# Patient Record
Sex: Male | Born: 1935 | Race: White | Hispanic: No | Smoking: Current every day smoker
Health system: Southern US, Community
[De-identification: ages and names within clinical notes are randomized; demographics above are authoritative.]

## PROBLEM LIST (undated history)

## (undated) DIAGNOSIS — N529 Male erectile dysfunction, unspecified: Secondary | ICD-10-CM

## (undated) DIAGNOSIS — R079 Chest pain, unspecified: Secondary | ICD-10-CM

## (undated) DIAGNOSIS — G63 Polyneuropathy in diseases classified elsewhere: Principal | ICD-10-CM

## (undated) DIAGNOSIS — C61 Malignant neoplasm of prostate: Secondary | ICD-10-CM

## (undated) DIAGNOSIS — Z860100 Personal history of colon polyps, unspecified: Secondary | ICD-10-CM

## (undated) DIAGNOSIS — J449 Chronic obstructive pulmonary disease, unspecified: Secondary | ICD-10-CM

## (undated) DIAGNOSIS — G8929 Other chronic pain: Secondary | ICD-10-CM

## (undated) DIAGNOSIS — R296 Repeated falls: Secondary | ICD-10-CM

## (undated) DIAGNOSIS — E43 Unspecified severe protein-calorie malnutrition: Secondary | ICD-10-CM

## (undated) DIAGNOSIS — L03115 Cellulitis of right lower limb: Secondary | ICD-10-CM

## (undated) DIAGNOSIS — E785 Hyperlipidemia, unspecified: Secondary | ICD-10-CM

## (undated) DIAGNOSIS — G629 Polyneuropathy, unspecified: Secondary | ICD-10-CM

## (undated) DIAGNOSIS — N400 Enlarged prostate without lower urinary tract symptoms: Secondary | ICD-10-CM

## (undated) DIAGNOSIS — J189 Pneumonia, unspecified organism: Secondary | ICD-10-CM

## (undated) DIAGNOSIS — D510 Vitamin B12 deficiency anemia due to intrinsic factor deficiency: Secondary | ICD-10-CM

## (undated) DIAGNOSIS — K625 Hemorrhage of anus and rectum: Secondary | ICD-10-CM

## (undated) DIAGNOSIS — F419 Anxiety disorder, unspecified: Secondary | ICD-10-CM

## (undated) DIAGNOSIS — F32A Depression, unspecified: Secondary | ICD-10-CM

## (undated) DIAGNOSIS — M51379 Other intervertebral disc degeneration, lumbosacral region without mention of lumbar back pain or lower extremity pain: Secondary | ICD-10-CM

## (undated) DIAGNOSIS — F329 Major depressive disorder, single episode, unspecified: Secondary | ICD-10-CM

## (undated) DIAGNOSIS — M545 Low back pain, unspecified: Secondary | ICD-10-CM

## (undated) DIAGNOSIS — M1990 Unspecified osteoarthritis, unspecified site: Secondary | ICD-10-CM

## (undated) DIAGNOSIS — M069 Rheumatoid arthritis, unspecified: Secondary | ICD-10-CM

## (undated) DIAGNOSIS — M5137 Other intervertebral disc degeneration, lumbosacral region: Secondary | ICD-10-CM

## (undated) DIAGNOSIS — K219 Gastro-esophageal reflux disease without esophagitis: Secondary | ICD-10-CM

## (undated) DIAGNOSIS — C44311 Basal cell carcinoma of skin of nose: Secondary | ICD-10-CM

## (undated) DIAGNOSIS — M810 Age-related osteoporosis without current pathological fracture: Secondary | ICD-10-CM

## (undated) DIAGNOSIS — C50929 Malignant neoplasm of unspecified site of unspecified male breast: Secondary | ICD-10-CM

## (undated) DIAGNOSIS — M47812 Spondylosis without myelopathy or radiculopathy, cervical region: Secondary | ICD-10-CM

## (undated) DIAGNOSIS — M4800 Spinal stenosis, site unspecified: Secondary | ICD-10-CM

## (undated) DIAGNOSIS — I10 Essential (primary) hypertension: Secondary | ICD-10-CM

## (undated) DIAGNOSIS — M47817 Spondylosis without myelopathy or radiculopathy, lumbosacral region: Secondary | ICD-10-CM

## (undated) DIAGNOSIS — I739 Peripheral vascular disease, unspecified: Secondary | ICD-10-CM

## (undated) HISTORY — DX: Spinal stenosis, site unspecified: M48.00

## (undated) HISTORY — PX: BASAL CELL CARCINOMA EXCISION: SHX1214

## (undated) HISTORY — PX: KNEE SURGERY: SHX244

## (undated) HISTORY — DX: Male erectile dysfunction, unspecified: N52.9

## (undated) HISTORY — DX: Spondylosis without myelopathy or radiculopathy, lumbosacral region: M47.817

## (undated) HISTORY — PX: WRIST FUSION: SHX839

## (undated) HISTORY — DX: Unspecified severe protein-calorie malnutrition: E43

## (undated) HISTORY — DX: Hyperlipidemia, unspecified: E78.5

## (undated) HISTORY — PX: HERNIA REPAIR: SHX51

## (undated) HISTORY — PX: CERVICAL DISC SURGERY: SHX588

## (undated) HISTORY — DX: Polyneuropathy, unspecified: G62.9

## (undated) HISTORY — DX: Personal history of colon polyps, unspecified: Z86.0100

## (undated) HISTORY — DX: Peripheral vascular disease, unspecified: I73.9

## (undated) HISTORY — PX: TRANSURETHRAL RESECTION OF PROSTATE: SHX73

## (undated) HISTORY — PX: CHOLECYSTECTOMY: SHX55

## (undated) HISTORY — DX: Anxiety disorder, unspecified: F41.9

## (undated) HISTORY — DX: Spondylosis without myelopathy or radiculopathy, cervical region: M47.812

## (undated) HISTORY — DX: Vitamin B12 deficiency anemia due to intrinsic factor deficiency: D51.0

## (undated) HISTORY — DX: Chronic obstructive pulmonary disease, unspecified: J44.9

## (undated) HISTORY — DX: Benign prostatic hyperplasia without lower urinary tract symptoms: N40.0

## (undated) HISTORY — DX: Personal history of colonic polyps: Z86.010

## (undated) HISTORY — DX: Polyneuropathy in diseases classified elsewhere: G63

---

## 1958-10-16 DIAGNOSIS — J189 Pneumonia, unspecified organism: Secondary | ICD-10-CM

## 1958-10-16 HISTORY — PX: EXCISIONAL HEMORRHOIDECTOMY: SHX1541

## 1958-10-16 HISTORY — DX: Pneumonia, unspecified organism: J18.9

## 1997-07-08 ENCOUNTER — Ambulatory Visit (HOSPITAL_COMMUNITY): Admission: RE | Admit: 1997-07-08 | Discharge: 1997-07-08 | Payer: Self-pay | Admitting: Urology

## 1997-10-28 ENCOUNTER — Ambulatory Visit (HOSPITAL_BASED_OUTPATIENT_CLINIC_OR_DEPARTMENT_OTHER): Admission: RE | Admit: 1997-10-28 | Discharge: 1997-10-28 | Payer: Self-pay | Admitting: Plastic Surgery

## 1998-02-05 ENCOUNTER — Inpatient Hospital Stay (HOSPITAL_COMMUNITY): Admission: RE | Admit: 1998-02-05 | Discharge: 1998-02-08 | Payer: Self-pay | Admitting: Neurosurgery

## 1998-07-17 ENCOUNTER — Ambulatory Visit (HOSPITAL_BASED_OUTPATIENT_CLINIC_OR_DEPARTMENT_OTHER): Admission: RE | Admit: 1998-07-17 | Discharge: 1998-07-17 | Payer: Self-pay | Admitting: Urology

## 1999-07-30 ENCOUNTER — Encounter: Payer: Self-pay | Admitting: Emergency Medicine

## 1999-07-30 ENCOUNTER — Emergency Department (HOSPITAL_COMMUNITY): Admission: EM | Admit: 1999-07-30 | Discharge: 1999-07-30 | Payer: Self-pay | Admitting: Emergency Medicine

## 1999-08-02 ENCOUNTER — Encounter: Admission: RE | Admit: 1999-08-02 | Discharge: 1999-10-31 | Payer: Self-pay | Admitting: Anesthesiology

## 1999-09-03 ENCOUNTER — Encounter (INDEPENDENT_AMBULATORY_CARE_PROVIDER_SITE_OTHER): Payer: Self-pay | Admitting: Specialist

## 1999-09-03 ENCOUNTER — Ambulatory Visit (HOSPITAL_BASED_OUTPATIENT_CLINIC_OR_DEPARTMENT_OTHER): Admission: RE | Admit: 1999-09-03 | Discharge: 1999-09-03 | Payer: Self-pay | Admitting: Urology

## 2000-07-24 ENCOUNTER — Encounter: Payer: Self-pay | Admitting: Family Medicine

## 2000-07-24 ENCOUNTER — Ambulatory Visit (HOSPITAL_COMMUNITY): Admission: RE | Admit: 2000-07-24 | Discharge: 2000-07-24 | Payer: Self-pay | Admitting: Family Medicine

## 2001-10-02 ENCOUNTER — Encounter: Payer: Self-pay | Admitting: Family Medicine

## 2001-10-02 ENCOUNTER — Encounter: Admission: RE | Admit: 2001-10-02 | Discharge: 2001-10-02 | Payer: Self-pay | Admitting: Family Medicine

## 2002-02-14 DIAGNOSIS — C61 Malignant neoplasm of prostate: Secondary | ICD-10-CM

## 2002-02-14 HISTORY — DX: Malignant neoplasm of prostate: C61

## 2002-03-18 ENCOUNTER — Encounter: Admission: RE | Admit: 2002-03-18 | Discharge: 2002-03-18 | Payer: Self-pay | Admitting: Infectious Diseases

## 2002-10-01 ENCOUNTER — Encounter (INDEPENDENT_AMBULATORY_CARE_PROVIDER_SITE_OTHER): Payer: Self-pay

## 2002-10-01 ENCOUNTER — Ambulatory Visit (HOSPITAL_BASED_OUTPATIENT_CLINIC_OR_DEPARTMENT_OTHER): Admission: RE | Admit: 2002-10-01 | Discharge: 2002-10-01 | Payer: Self-pay | Admitting: Urology

## 2002-10-24 ENCOUNTER — Ambulatory Visit (HOSPITAL_COMMUNITY): Admission: RE | Admit: 2002-10-24 | Discharge: 2002-10-24 | Payer: Self-pay | Admitting: Urology

## 2002-10-24 ENCOUNTER — Encounter: Payer: Self-pay | Admitting: Urology

## 2002-11-25 ENCOUNTER — Ambulatory Visit: Admission: RE | Admit: 2002-11-25 | Discharge: 2003-02-23 | Payer: Self-pay | Admitting: Radiation Oncology

## 2002-12-31 ENCOUNTER — Ambulatory Visit (HOSPITAL_BASED_OUTPATIENT_CLINIC_OR_DEPARTMENT_OTHER): Admission: RE | Admit: 2002-12-31 | Discharge: 2002-12-31 | Payer: Self-pay | Admitting: Urology

## 2003-02-24 ENCOUNTER — Ambulatory Visit: Admission: RE | Admit: 2003-02-24 | Discharge: 2003-04-14 | Payer: Self-pay | Admitting: Radiation Oncology

## 2003-04-07 ENCOUNTER — Encounter: Admission: RE | Admit: 2003-04-07 | Discharge: 2003-07-06 | Payer: Self-pay | Admitting: Neurology

## 2003-04-28 ENCOUNTER — Ambulatory Visit: Admission: RE | Admit: 2003-04-28 | Discharge: 2003-04-28 | Payer: Self-pay | Admitting: Radiation Oncology

## 2003-10-23 ENCOUNTER — Ambulatory Visit (HOSPITAL_COMMUNITY): Admission: RE | Admit: 2003-10-23 | Discharge: 2003-10-23 | Payer: Self-pay | Admitting: Gastroenterology

## 2003-10-23 ENCOUNTER — Encounter (INDEPENDENT_AMBULATORY_CARE_PROVIDER_SITE_OTHER): Payer: Self-pay | Admitting: Specialist

## 2003-10-28 ENCOUNTER — Ambulatory Visit: Admission: RE | Admit: 2003-10-28 | Discharge: 2003-10-28 | Payer: Self-pay | Admitting: Radiation Oncology

## 2004-02-19 ENCOUNTER — Ambulatory Visit (HOSPITAL_COMMUNITY): Admission: RE | Admit: 2004-02-19 | Discharge: 2004-02-19 | Payer: Self-pay | Admitting: Family Medicine

## 2005-06-15 ENCOUNTER — Encounter: Payer: Self-pay | Admitting: Neurosurgery

## 2005-11-18 ENCOUNTER — Encounter: Admission: RE | Admit: 2005-11-18 | Discharge: 2005-11-18 | Payer: Self-pay | Admitting: Family Medicine

## 2005-11-28 ENCOUNTER — Encounter: Admission: RE | Admit: 2005-11-28 | Discharge: 2005-11-28 | Payer: Self-pay | Admitting: Family Medicine

## 2006-05-26 ENCOUNTER — Encounter: Admission: RE | Admit: 2006-05-26 | Discharge: 2006-05-26 | Payer: Self-pay | Admitting: Family Medicine

## 2007-08-28 ENCOUNTER — Encounter: Admission: RE | Admit: 2007-08-28 | Discharge: 2007-08-28 | Payer: Self-pay | Admitting: Family Medicine

## 2007-11-08 ENCOUNTER — Encounter: Admission: RE | Admit: 2007-11-08 | Discharge: 2007-11-08 | Payer: Self-pay | Admitting: General Surgery

## 2009-07-22 ENCOUNTER — Emergency Department (HOSPITAL_COMMUNITY): Admission: EM | Admit: 2009-07-22 | Discharge: 2009-07-22 | Payer: Self-pay | Admitting: Emergency Medicine

## 2009-12-01 ENCOUNTER — Encounter: Admission: RE | Admit: 2009-12-01 | Discharge: 2009-12-01 | Payer: Self-pay | Admitting: Cardiovascular Disease

## 2009-12-07 ENCOUNTER — Inpatient Hospital Stay (HOSPITAL_COMMUNITY): Admission: RE | Admit: 2009-12-07 | Discharge: 2009-12-08 | Payer: Self-pay | Admitting: Cardiovascular Disease

## 2009-12-07 HISTORY — PX: ILIAC ARTERY STENT: SHX1786

## 2010-03-07 ENCOUNTER — Encounter: Payer: Self-pay | Admitting: Family Medicine

## 2010-04-28 LAB — BASIC METABOLIC PANEL
CO2: 30 mEq/L (ref 19–32)
Chloride: 105 mEq/L (ref 96–112)
Creatinine, Ser: 0.97 mg/dL (ref 0.4–1.5)
GFR calc Af Amer: >60 mL/min (ref >60)
Glucose, Bld: 91 mg/dL (ref 70–99)

## 2010-04-28 LAB — CBC
Hemoglobin: 13.2 g/dL (ref 13.0–17.0)
MCH: 33 pg (ref 26.0–34.0)
MCV: 97 fL (ref 78.0–100.0)
Platelets: 118 10*3/uL — ABNORMAL LOW (ref 150–400)
RBC: 4 MIL/uL — ABNORMAL LOW (ref 4.22–5.81)

## 2010-06-29 NOTE — Procedures (Signed)
NAMENESANEL, AGUILA NO.:  [id number]   MEDICAL RECORD NO.:  [id number]          PATIENT TYPE:  INP   LOCATION:  2507                         FACILITY:  [facility]   PHYSICIAN:  Nanetta Batty, M.D.   DATE OF BIRTH:  06/19/1934   DATE OF PROCEDURE:  DATE OF DISCHARGE:                    PERIPHERAL VASCULAR INVASIVE PROCEDURE    Mr. [name] is a 76 year old thin-appearing, widowed Caucasian male,  father of 3, grandfather to 2 great grandchildren who was disabled for  20 years because of cervical disk disease.  He has had multiple  operations to his neck as well as his knees.  He was referred to Dr.  Charlsie Merles for lower extremity discomfort, and Doppler studies performed in  our office Nov 16, 2009.   His risk factors include 55 pack year history of tobacco abuse with a  one pack a day currently.  He has hyperlipidemia.  He has never had  heart attack or stroke.  He has noted some increase in physical  insertion.  He gets occasional chest pain.  He does complain of severe  lower extremity weakness or discomfort with Dopplers that showed a right  ABI of 0.46 and occluded right common iliac artery.  His left ABI is  0.87 with a high-frequency signal on his left common iliac artery and  distal left SFA.  Presenting Myoview is nonischemic.  He presents now  for angiography potential percutaneous intervention for lifestyle-  limiting claudication.   PROCEDURE DESCRIPTION:  The patient was brought to the second floor  [facility] angiographic suite in the post absorptive state.  He was  premedicated with p.o. Valium, IV Versed, and fentanyl.  His left groin  was prepped and shaved in usual sterile fashion.  A 1% Xylocaine was  used for local anesthesia.  A 5-French sheath was inserted into left  femoral artery using standard Seldinger technique.  A 5-French pigtail  catheter was used for abdominal aortography, bifemoral runoff using  bolus chase digital subtraction  step-table technique.  Visipaque dye was  used for entirety of the case.  Retrograde aortic pressures were  monitored in the case.   ANGIOGRAPHIC RESULTS.:  1. Abdominal aorta.      a.     Renal arteries - 60% left renal artery stenosis.      b.     Infrarenal abdominal aorta; moderate atherosclerotic       changes.  2. Left lower extremity.      a.     A 50% hypodense left common iliac artery stenosis with a 40-       mm pullback gradient after administration of 200 mcg of       intracoronary nitroglycerin.      b.     A 60% mid left SFA with three-vessel runoff.  3. Right lower extremity;      a.     Total right common iliac artery with reconstitution just       above the internal external iliac bifurcation 3-D, 70% above-the-       knee popliteal stenosis three-vessel runoff.   The patient  received 4000 units of heparin intravenously.  A 5-French  sheath was exchanged over the Versacore wire for a 6-French bright tip.  The left common iliac artery was then stented with a 10 x 4 Smart  nitinol self-expanding stent postdilated with an 8 x 2 Powerflex  resulting in reduction of 50% segmental hypodense stenosis to 0%  residual.  The patient tolerated procedure well.  The left common  femoral artery was then sealed with ExoSeal  hemostatic device successfully.  The patient left lab in stable  condition.  He will be gently hydrated overnight, treated with aspirin  and Plavix and discharged home in the morning.  He will get followup  Dopplers in the office and will see me back after that.  He is a  candidate for fem-fem crossover grafting.  He left the lab in stable  condition.      Nanetta Batty, M.D.      JB/MEDQ  D:  12/07/2009  T:  12/08/2009  Job:  [id number]   cc:   Second Floor [facility]  [facility]  Lenn Sink, D.P.M.  Bryan Lemma. Manus Gunning, M.D.   Electronically Signed by Nanetta Batty M.D. on 12/17/2009 02:55:03  PM

## 2010-07-02 NOTE — Op Note (Signed)
NAME:  ALIOU, MEALEY                           ACCOUNT NO.:  [id number]   MEDICAL RECORD NO.:  [id number]                   PATIENT TYPE:  AMB   LOCATION:  [facility]                                 FACILITY:  [facility]   PHYSICIAN:  Ronald L. Ovidio Hanger, M.D.           DATE OF BIRTH:  10-Aug-1934   DATE OF PROCEDURE:  12/31/2002  DATE OF DISCHARGE:                                 OPERATIVE REPORT   DIAGNOSIS:  Prostate cancer.   PROCEDURE:  Transrectal ultrasound of the prostate with gold seed marker  implantation.   SURGEON:  Lucrezia Starch. Earlene Plater, M.D.   ANESTHESIA:  LMA.   ESTIMATED BLOOD LOSS:  Negligible.   TUBES:  None.   COMPLICATIONS:  None.   INDICATION FOR PROCEDURE:  Mr. Christen is a very nice 76 year old white male  who has prostate cancer.  He has been unable to undergo transrectal  ultrasound without full anesthesia.  He has elected to proceed with IMRT-  type external beam radiotherapy and needs gold seed implantation for the  localization.  After understanding the risks, benefits, and alternatives, he  has elected to proceed.   PROCEDURE IN DETAIL:  The patient was placed in the supine position, after  proper LMA anesthesia was placed in the dorsal lithotomy position.  The  transrectal ultrasound probe, Siemens, was placed.  Ultrasound of the  prostate was performed and gold seeds were implanted at the right base  periurethral, at the right apex, and the left midprostate laterally, for a  total of three seeds.  Following implantation, the device was removed and  the patient was taken to the recovery room in stable condition.                                               Ronald L. Ovidio Hanger, M.D.    RLD/MEDQ  D:  12/31/2002  T:  12/31/2002  Job:  [id number]

## 2010-07-02 NOTE — Op Note (Signed)
NAME:  [name], [name]                           ACCOUNT NO.:  [id number]   MEDICAL RECORD NO.:  [id number]                   PATIENT TYPE:  AMB   LOCATION:  ENDO                                 [facility]ACILITY:  [facility]   PHYSICIAN:  Danise Edge, M.D.                DATE OF BIRTH:  1934-08-23   DATE OF PROCEDURE:  10/23/2003  DATE OF DISCHARGE:                                 OPERATIVE REPORT   PROCEDURES:  Esophagogastroduodenoscopy, colonoscopy, and polypectomy.   PROCEDURE INDICATION:  [name] is a 76 year old male born May 20, 1934.  [name] suffers with constipation unassociated with  gastrointestinal bleeding.  His mother was diagnosed with colon cancer.  He  is scheduled to undergo his first screening colonoscopy with polypectomy to  prevent colon cancer.   When swallowing pills, [name] feels as though they stop in either the  hypopharynx or proximal esophagus.  He will undergo a diagnostic  esophagogastroduodenoscopy with possible esophageal dilation to evaluate  cervical esophageal dysphagia.   MEDICATION ALLERGIES:  PENICILLIN.   CHRONIC MEDICATIONS:  Lipitor, Percodan, vitamin C, multivitamin.   PAST MEDICAL HISTORY:  1.  Radiation therapy for prostate cancer.  2.  Cervical spine surgery.  3.  Knee surgery.  4.  Nose surgery.  5.  Cholecystectomy.   FAMILY HISTORY:  Mother with colon cancer.   ENDOSCOPIST:  Danise Edge, M.D.   PREMEDICATION:  Demerol 100 mg, Versed 10 mg.   PROCEDURE:  Diagnostic esophagogastroduodenoscopy.  After obtaining informed  consent, [name] was placed in the left lateral decubitus position.  I  administered intravenous Demerol and intravenous Versed to achieve conscious  sedation for the procedure.  The patient's blood pressure, oxygen  saturation, and cardiac rhythm were monitored throughout the procedure and  documented in the medical record.   The Olympus gastroscope was passed through the posterior hypopharynx  into  the proximal esophagus without difficulty.  The hypopharynx, larynx, and  vocal cords appeared normal.   Esophagoscopy:  The proximal, mid-, and lower segments of the esophageal  mucosa appear completely normal.  There is no esophageal obstruction and no  esophageal scarring.   Gastroscopy:  Retroflexed view of the gastric cardia and fundus was normal.  The gastric body appeared normal.  [name] has multiple scattered erosions  in the gastric antrum without deep ulceration.  I suspect it is due to the  Percodan, which I think has aspirin.  The pylorus appears normal.  A biopsy  was taken from the distal gastric antrum for CLOtest.  It appears to be  turning positive in an hour.   Duodenoscopy:  The duodenal bulb and descending duodenum appear normal.   ASSESSMENT:  Multiple erosions in the gastric antrum.  CLOtest to rule out  Helicobacter pylori antral gastritis pending.  No obstruction noted in the  hypopharynx or down the length of the esophagus.  PROCEDURE:  Screening colonoscopy with polypectomy.  Anal inspection and  digital rectal exam were normal.  The Olympus adjustable pediatric  colonoscope was introduced into the rectum and advanced to the cecum.  Colonic preparation for the exam today was excellent.   Rectum:  From the distal rectum a 3 mm sessile polyp was removed with  electrocautery snare.   Sigmoid colon and descending colon:  Colonic diverticulosis.  From the  distal sigmoid colon a 2 mm sessile polyp was removed with the  electrocautery snare.   Splenic flexure normal.   Transverse colon normal.   Hepatic flexure normal.   Ascending colon normal.   Cecum and ileocecal valve normal.   ASSESSMENT:  1.  A small polyp was removed from the distal rectum.  2.  A small polyp was removed from the distal sigmoid colon.  3.  Left colonic diverticulosis.                                               Danise Edge, M.D.    MJ/MEDQ  D:  10/23/2003   T:  10/23/2003  Job:  [id number]   cc:   Bryan Lemma. Manus Gunning, M.D.  [address] [place]  [place] [address]  Fax: [phone]

## 2010-07-02 NOTE — Op Note (Signed)
NAME:  YOREL, REDDER                           ACCOUNT NO.:  [id number]   MEDICAL RECORD NO.:  [id number]                   PATIENT TYPE:  AMB   LOCATION:  [facility]                                 FACILITY:  [facility]   PHYSICIAN:  Ronald L. Ovidio Hanger, M.D.           DATE OF BIRTH:  April 26, 1934   DATE OF PROCEDURE:  10/01/2002  DATE OF DISCHARGE:                                 OPERATIVE REPORT   PREOPERATIVE DIAGNOSIS:  Elevated PSA.   POSTOPERATIVE DIAGNOSIS:  Elevated PSA.   PROCEDURE:  Transrectal ultrasound of the prostate and biopsy.   SURGEON:  Lucrezia Starch. Earlene Plater, M.D.   ANESTHESIA:  LMA.   ESTIMATED BLOOD LOSS:  Negligible.   TUBES:  None.   COMPLICATIONS:  None.   INDICATIONS FOR PROCEDURE:  Mr. Danese is a very nice 76 year old white male  whose had persistent elevated PSA. He has had two sets of negative biopsies  previously with the PSA elevating. It is felt that repeat biopsies were  indicated. He also had undergone a UPM3 test to measure the PCA 3 gene and  that is currently pending. He has a known rectal web and has great  difficulty with transrectal ultrasound of the prostate and requires  anesthesia to be able to dilate the rectum to perform the biopsy. After  understanding the risks, benefits, and alternatives, bleeding, infection,  etc., he has elected to proceed.   DESCRIPTION OF PROCEDURE:  The patient was placed in the supine position and  after proper laryngeal mask anesthesia was placed in the dorsal lithotomy  position. The GE ultrasound probe was placed, the rectal web was dilated  with digital pressure and the probe was placed. Utilizing the 7 MHz probe  both axial and sagittal scans were performed of the prostate and seminal  vesicle. The prostate was noted to be 43.18 mL utilizing volume metric  measurement with some heteroechogenicity within it. There were central stone  calcifications especially on the left side at the edge of the transitional  zone  and there might be some slight hypoechoism in the left peripheral zone.  The capsule was intact as was the seminal vesicle and seminal vesicle  prostatic angle. Utilizing the biopsy probe, five biopsies were obtained  from each side and submitted as the right and left side. The apex mid base  laterally, mid prostate and apical prostate centrally were submitted. Good  hemostasis was noted to be present and the probe was removed and the patient  was taken to the recovery room stable.                                               Ronald L. Ovidio Hanger, M.D.    RLD/MEDQ  D:  10/01/2002  T:  10/01/2002  Job:  [id number]  

## 2010-07-02 NOTE — Procedures (Signed)
[facility]  Patient:    [name]                        MRN: [id number] Proc. Date: 08/30/99 Adm. Date:  [id number] Attending:  Thyra Breed CC:         Carolyne Fiscal, M.D.                           Procedure Report  PROCEDURE:  SI joint injection on the left with corticosteroids.  DIAGNOSIS:  Left SI joint pain with known multilevel degenerative disk disease most pronounced at L5, S1.  HISTORY OF PRESENT ILLNESS:  [name] is a very pleasant 76 year old gentleman who was sent to [place] by Dr. Duaine Dredge for a left SI joint injection. The patient stated that he was in his usual state of health of multiple articular pain, neck pain, and low back pain up until about three to four months ago when he began to have pain initially in the left SI joint region radiating out into the left buttock and leg which was brought on by walking. He noted that initially he would get a sense of burning in his hips and lower buttock whenever he would walk.  This improved when he would sit down.  It has progressed to the point that it is now bilateral.  It will awaken him from sleep about every 30 minutes with a steady, throbbing, tingling-type sensation.  He has been seen by Dr. Anne Hahn and underwent nerve conduction studies which were within normal limits, although he had some increased exertional activity in his left paraspinous muscles.  He has chronic denervation of C5 in the left upper extremity.  He has been treated with Neurontin in the past as well as multiple other medications including MS Contin which he stated was helpful, but he stopped taking it because he did not have an insurance card to cover his medications, but now he does.  He is currently using Percocet which he states does help.  His pain is made worse by walking and improved by rest.  He has been seen by multiple physicians including Dr. Jeral Fruit, Dr. Lesia Sago, Dr. Eulah Pont, and his primary  care physician is Dr. Duaine Dredge who currently is having him evaluated for an elevated PSA and prostatic hypertrophy.  He has been seen by Dr. Kellie Simmering in the past.  CURRENT MEDICATIONS:  Percocet.  ALLERGIES:  No known drug allergies.  FAMILY HISTORY:  Positive for cancer and osteoarthritis.  ACTIVE MEDICAL PROBLEMS:  Osteoarthritis.  PAST SURGICAL HISTORY:  Significant for seven knee operations eventually leading to fusion by Dr. Randall An at [facility], multiple shoulder surgeries, four level cervical fusion by Dr. 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 Teressa Senter.  REVIEW OF SYSTEMS:  GENERAL:  Negative.  HEAD:  Significant for occasional cervicogenic headache.  EYES:  Negative.  NOSE, MOUTH, THROAT:  Significant for some difficulties with swallowing here recently.  EARS:  Significant for itching whenever he lies flat.  PULMONARY:  Negative.  CARDIOVASCULAR: Negative.  GI:  Negative.  GU:  Significant for BPH. MUSCULOSKELETAL/NEUROLOGICAL:  See HPI.  CUTANEOUS: Negative.  HEMATOLOGIC: Negative.  ENDOCRINE:  Negative.  ENDOCRINE:  Negative.  PSYCHIATRIC: Negative.  ALLERGY/IMMUNOLOGIC:  Negative.  The patient did not list any other medications but his Percocet but in looking back at Dr. Polly Cobia notes, he has been on Flonase.  PHYSICAL EXAMINATION:  VITAL SIGNS:  Blood pressure is 153/87.  Heart rate is 88.  Respiratory rate 16.  O2 saturations 96%.  Temperature is 98.4.  Pain level is 7/10.  GENERAL:  This is a pleasant male who is very well kept.  HEENT:  Extraocular movements intact with conjunctivae sclerae clear.  Nose with patent nares.  Oropharynx demonstrated dental plates.  NECK:  Demonstrated limitation of range of motion much as expected from his four level disk fusion.  Carotids were 2+ and symmetric without bruits.  LUNGS:  Clear.  HEART:  Regular rate and rhythm.  ABDOMEN:  Bowel sounds  present.  GENITALIA/RECTAL:  Not performed.  BACK:  Back examination revealed minimal tenderness to palpation even over the SI joints today.  Straight leg raise signs were essentially negative.  EXTREMITIES:  The patient demonstrated fusion of his right wrist and of his right knee.  Radial pulses and dorsalis pedis pulses were 2+ and symmetric.  NEUROLOGIC:  The patient was oriented x 4.  Cranial nerves 2-12 were grossly intact.  Deep tendon reflexes were symmetric in the upper and lower extremity with the exception of the absence of the right knee jerk and limited changes from his right brachioradialis.  Plantar reflexes were downgoing. Coordination was grossly intact.  IMPRESSION: 1. History of left SI joint pain with a history that sounds almost    consistent with some early pseudoclaudication.  The last MRI/myelogram    of the back was done back in 1998, at which time he had broad-based L5,    S1 disk protrusion and air tracking along the left S1 nerve root. 2. Osteoarthritis. 3. History of chronic depression. 4. BPH with elevated PSA. 5. Burning dysesthesias of feet.  DISPOSITION:  I discussed an SI joint injection in detail with the patient and discussed the limitations as well as the potential side effects of the corticosteroids.  He is interested in pursuing this.  DESCRIPTION OF PROCEDURE:  After informed consent was obtained, the patient was taken to the fluoroscopy suite where he was placed in a prone position and monitored.  A pillow was placed under his abdomen.  Using fluoroscopic guidance, I optimized visualization of the left SI joint.  I marked the skin overlying this.  The area was prepped with Betadine x 3 and draped.  Using a 25 gauge needle, I anesthetized the skin and subcutaneous structures with 1 cc of 1% lidocaine.  The 25 gauge spinal needle was introduced into the left SI joint.  Aspiration was negative.  I injected 1 cc of 1% lidocaine with 40 mg of  Medrol.  The needle was flushed with 1% lidocaine and removed intact.   Postprocedure condition - stable.  DISCHARGE INSTRUCTIONS: 1. Resume previous diet. 2. Limitations on activities per instruction sheet. 3. Continue on current medications. 4. The patient plans to follow up with Dr. Duaine Dredge.  If he does not    respond positively to the SI joint injection, would consider repeat    MRI to find out whether he has had any progression of his degenerative disk    disease and possibly for the early onset of lumbar spinal stenosis. DD:  08/30/99 TD:  08/30/99 Job: [id number]

## 2010-07-02 NOTE — Procedures (Signed)
[place]  Patient:    [name], [name]                        MRN: [id number] Proc. Date: 09/03/99 Adm. Date:  [id number] Disc. Date: [id number] Attending:  Nelma Rothman Iii                           Procedure Report  INDICATIONS: 1.  Elevated PSA. 2.  Urgency. 3.  Frequency.  OPERATIVE PROCEDURE:  Cystourethroscopy and transrectal ultrasound of prostate with biopsy.  SURGEON:  Lucrezia Starch. Ovidio Hanger, M.D.  ANESTHESIA:  General laryngeal airway.  ESTIMATED BLOOD LOSS:  Negligible.  TUBES:  None.  COMPLICATIONS:  None.  INDICATIONS:  The patient is a very nice 76 year-old white male who presented with urgency and frequency.  He underwent transrectal ultrasound and biopsy of prostate on 07/17/98 which was negative.  His PSA has gradually risen to 8.7 despite being on antibiotics and, with the rising PSA, we feel transrectal ultrasound and biopsy of the prostate is indicated, along with cystourethoscopy due to his significant voiding symptoms.  He has a very tight scarring of the rectum and office transrectal ultrasound has been impossible. After understanding the risks, benefits, and alternatives, he elected to undergo the above procedure.  PROCEDURE IN DETAIL:  The patient was placed in the supine position.  After proper general laryngeal airway anesthesia, he was prepped and draped with Betadine in the sterile fashion.  Cystourethroscopy was performed with a 22.5 French Olympus panendoscope utilizing the 12 and 70 degree lenses.  The bladder was carefully inspected.  Efflux of clear urine was noted from the normally placed ureteral orifices bilaterally.  There was moderate trilobar hypertrophy.  Grade I trabeculation was noted.  There were no lesions in the bladder or the urethra.  We suspect his symptomatology relates to his benign prostatic hypertrophy.  The cystourethroscope was visually removed.  The rectum was dilated with a rectal  dilator very easily and transrectal ultrasound of the prostate was performed with a 7.5 mH probe.  Both axial and sagittal scans were performed.  The prostate was noted to be 27.3 cc.  There were some central zone calcifications noted, mild transitional zone hypertrophy, and what appeared to be a cyst at the junction of the central and peripheral zone to the left side.  The seminal vesicles were intact at the seminovesical prostatic angle and the prostate capsule.  No hypo- or hyperechoic lesions were noted.  Utilizing the biopsy probe, Sextant biopsies were obtained and submitted to pathology.  The probe was removed.  The patient tolerated the procedure well and was taken to the recovery room stable. D:  09/03/99 TD:  09/06/99 Job: [id number]

## 2010-07-19 ENCOUNTER — Other Ambulatory Visit: Payer: Self-pay | Admitting: Gastroenterology

## 2011-04-12 ENCOUNTER — Encounter: Payer: Self-pay | Admitting: Vascular Surgery

## 2011-04-21 ENCOUNTER — Other Ambulatory Visit: Payer: Self-pay | Admitting: Family Medicine

## 2011-04-21 DIAGNOSIS — R1900 Intra-abdominal and pelvic swelling, mass and lump, unspecified site: Secondary | ICD-10-CM

## 2011-04-26 ENCOUNTER — Ambulatory Visit
Admission: RE | Admit: 2011-04-26 | Discharge: 2011-04-26 | Disposition: A | Discharge status: Home / Self Care | Payer: Medicare Other | Source: Ambulatory Visit | Attending: Family Medicine | Admitting: Family Medicine

## 2011-04-26 ENCOUNTER — Other Ambulatory Visit: Payer: Self-pay

## 2011-04-26 DIAGNOSIS — R1900 Intra-abdominal and pelvic swelling, mass and lump, unspecified site: Secondary | ICD-10-CM

## 2011-05-09 ENCOUNTER — Encounter: Payer: Self-pay | Admitting: Vascular Surgery

## 2011-05-10 ENCOUNTER — Ambulatory Visit (INDEPENDENT_AMBULATORY_CARE_PROVIDER_SITE_OTHER): Payer: Medicare Other | Admitting: Vascular Surgery

## 2011-05-10 ENCOUNTER — Encounter: Payer: Self-pay | Admitting: Vascular Surgery

## 2011-05-10 ENCOUNTER — Ambulatory Visit (INDEPENDENT_AMBULATORY_CARE_PROVIDER_SITE_OTHER): Payer: Medicare Other | Admitting: *Deleted

## 2011-05-10 VITALS — BP 113/88 | HR 66 | Temp 97.8°F | Resp 18 | Ht 68.0 in | Wt 133.6 lb

## 2011-05-10 DIAGNOSIS — I7092 Chronic total occlusion of artery of the extremities: Secondary | ICD-10-CM | POA: Insufficient documentation

## 2011-05-10 DIAGNOSIS — I739 Peripheral vascular disease, unspecified: Secondary | ICD-10-CM

## 2011-05-10 NOTE — Progress Notes (Signed)
Vascular and Vein Specialist of [facility]  Vascular Consult Note    Patient name: [name] MRN: [id number] DOB: 09-Apr-1934 Sex: male   Referred by: [name]  Reason for referral: Lower extremity pain  HISTORY OF PRESENT ILLNESS: Patient has extremely complex past medical history. He has severe pain reports this begins from the level of his neck down to both toes. He has constant pain in his neck back and both lower Shoney's. He reports that his left leg is greater than his right. The pain is constant and is not related to exercise he also reports some numbness as well. He does not have any tissue loss. He has had prior treatment with a left common iliac stent for stenosis in October of 2011 and has a known right common iliac artery occlusion. He has had 4 prior cervical surgeries. He does have a fusion of his right knee he is on Xanax and OxyContin for chronic pain  Past Medical History  Diagnosis Date  . Hyperlipidemia   . Chronic back pain   . History of colon polyps   . Spinal stenosis   . Cervical spondylosis   . Cancer 2004    prostate  . Peripheral neuropathy   . Anxiety   . COPD (chronic obstructive pulmonary disease)   . BPH (benign prostatic hypertrophy)   . ED (erectile dysfunction)   . Pernicious anemia   . Peripheral vascular disease     Past Surgical History  Procedure Date  . Basal cell carcinoma excision     Nose x 3  . Spine surgery     Cervical Spine X 4 levels  . Cholecystectomy   . Wrist fusion     Right Wrist  . Hemorroidectomy   . Knee surgery     X 7 surgeries  . Iliac artery stent 12-07-09    Stent done by Dr. Allyson Sabal    History   Social History  . Marital Status: Widowed    Spouse Name: N/A    Number of Children: N/A  . Years of Education: N/A   Occupational History  . Not on file.   Social History Main Topics  . Smoking status: Current Everyday Smoker -- 1.0 packs/day  . Smokeless tobacco: Not on file  . Alcohol Use: No  . Drug  Use: No  . Sexually Active:    Other Topics Concern  . Not on file   Social History Narrative  . No narrative on file    Family History  Problem Relation Age of Onset  . Hypertension Mother   . Cancer Mother     colon cancer    Allergies  Allergen Reactions  . Lipitor (Atorvastatin Calcium)     myalgias  . Spiriva Handihaler     hoarseness    Prior to Admission medications   Medication Sig Start Date End Date Taking? Authorizing Provider  ALPRAZolam Prudy Feeler) 1 MG tablet Take 1 mg by mouth at bedtime as needed.   Yes Historical Provider, MD  clopidogrel (PLAVIX) 75 MG tablet Take 75 mg by mouth daily.   Yes Historical Provider, MD  diazepam (VALIUM) 5 MG tablet Take 5 mg by mouth every 8 (eight) hours as needed.   Yes Historical Provider, MD  Fluticasone-Salmeterol (ADVAIR DISKUS) 250-50 MCG/DOSE AEPB Inhale 1 puff into the lungs every 12 (twelve) hours.   Yes Historical Provider, MD  Multiple Vitamin (MULTIVITAMIN) tablet Take 1 tablet by mouth daily.   Yes Historical Provider, MD  rosuvastatin (CRESTOR)  20 MG tablet Take 20 mg by mouth daily.   Yes Historical Provider, MD  sildenafil (VIAGRA) 100 MG tablet Take 100 mg by mouth daily as needed.   Yes Historical Provider, MD  vitamin B-12 (CYANOCOBALAMIN) 1000 MCG tablet Take 1,000 mcg by mouth every 6 (six) weeks.   Yes Historical Provider, MD  oxycodone (OXYCONTIN) 30 MG TB12 Take 30 mg by mouth 4 (four) times daily.    Historical Provider, MD     REVIEW OF SYSTEMS: Cardiovascular:  Does report shortness of breath when lying flat and with exertion reports leg pain with walking and lying flat, reports t swelling in his legs Pulmonary: No productive cough, asthma or wheezing. Neurologic: Reports weakness in his arms and legs Hematologic: No bleeding problems or clotting disorders. Musculoskeletal: No joint pain or joint swelling. Gastrointestinal: No blood in stool or hematemesis Genitourinary: No dysuria or  hematuria. Psychiatric:: No history of major depression. Integumentary: No rashes or ulcers. Constitutional: No fever, does report occasional chills.  PHYSICAL EXAMINATION:  Filed Vitals:   05/10/11 1522  BP: 113/88  Pulse: 66  Temp: 97.8 F (36.6 C)  Resp: 18    General: The patient appears their stated age. Pulmonary: There is a good air exchange bilaterally without wheezing or rales. Abdomen: Soft and non-tender with normal pitch bowel sounds. Musculoskeletal: There are no major deformities.  Does have a fusion of his right knee Neurologic: No focal weakness or paresthesias are detected, Skin: There are no ulcer or rashes noted. Psychiatric: The patient has normal affect. Cardiovascular: There is a regular rate and rhythm without significant murmur appreciated. Pulse status: 2+ radial pulses bilaterally. 2+ left femoral pulse and 1-2+ left dorsalis pedis pulse. Absent right femoral pulse. Does have 1+ right dorsalis pedis pulse Diagnostic Studies: Underwent lower surety Doppler studies which showed no change from a study in November of 2012 at [place]. Ankle arm index of 0.67 on the right and greater than 1 on the left  Outside Studies/Documentation Historical records were reviewed.  This included his angiogram and stenting of his left common iliac artery in October 2011  Medication Changes: None  Assessment:  Severe chronic bilateral lower extremity pain. He reports the left is actually greater than the right. He has normal lower extremity arterial flow on the left and moderate insufficiency on the right. His symptoms do not respond with arterial insufficiency  Plan: I discussed this at length with the patient. I do not feel there is any indication for intervention or further treatment of his moderate right leg arterial insufficiency. He will continue followup with his medical physicians for pain management  [name] 3/26/20133:58 PM

## 2011-08-14 ENCOUNTER — Inpatient Hospital Stay (HOSPITAL_COMMUNITY)
Admission: EM | Admit: 2011-08-14 | Discharge: 2011-08-19 | DRG: 470 | Disposition: A | Discharge status: Skilled Nursing Facility | Payer: Medicare Other | Attending: Internal Medicine | Admitting: Internal Medicine

## 2011-08-14 ENCOUNTER — Emergency Department (HOSPITAL_COMMUNITY): Payer: Medicare Other

## 2011-08-14 ENCOUNTER — Encounter (HOSPITAL_COMMUNITY): Payer: Self-pay

## 2011-08-14 ENCOUNTER — Inpatient Hospital Stay (HOSPITAL_COMMUNITY): Payer: Medicare Other

## 2011-08-14 DIAGNOSIS — G609 Hereditary and idiopathic neuropathy, unspecified: Secondary | ICD-10-CM | POA: Diagnosis present

## 2011-08-14 DIAGNOSIS — I739 Peripheral vascular disease, unspecified: Secondary | ICD-10-CM

## 2011-08-14 DIAGNOSIS — R791 Abnormal coagulation profile: Secondary | ICD-10-CM | POA: Diagnosis not present

## 2011-08-14 DIAGNOSIS — Y92009 Unspecified place in unspecified non-institutional (private) residence as the place of occurrence of the external cause: Secondary | ICD-10-CM

## 2011-08-14 DIAGNOSIS — F17200 Nicotine dependence, unspecified, uncomplicated: Secondary | ICD-10-CM | POA: Diagnosis present

## 2011-08-14 DIAGNOSIS — I7092 Chronic total occlusion of artery of the extremities: Secondary | ICD-10-CM

## 2011-08-14 DIAGNOSIS — W19XXXA Unspecified fall, initial encounter: Secondary | ICD-10-CM

## 2011-08-14 DIAGNOSIS — I70209 Unspecified atherosclerosis of native arteries of extremities, unspecified extremity: Secondary | ICD-10-CM | POA: Diagnosis present

## 2011-08-14 DIAGNOSIS — F411 Generalized anxiety disorder: Secondary | ICD-10-CM | POA: Diagnosis present

## 2011-08-14 DIAGNOSIS — S2239XA Fracture of one rib, unspecified side, initial encounter for closed fracture: Secondary | ICD-10-CM | POA: Diagnosis present

## 2011-08-14 DIAGNOSIS — S72009A Fracture of unspecified part of neck of unspecified femur, initial encounter for closed fracture: Secondary | ICD-10-CM

## 2011-08-14 DIAGNOSIS — S7290XA Unspecified fracture of unspecified femur, initial encounter for closed fracture: Secondary | ICD-10-CM

## 2011-08-14 DIAGNOSIS — M069 Rheumatoid arthritis, unspecified: Secondary | ICD-10-CM

## 2011-08-14 DIAGNOSIS — F419 Anxiety disorder, unspecified: Secondary | ICD-10-CM

## 2011-08-14 DIAGNOSIS — R55 Syncope and collapse: Secondary | ICD-10-CM

## 2011-08-14 DIAGNOSIS — Z79899 Other long term (current) drug therapy: Secondary | ICD-10-CM

## 2011-08-14 DIAGNOSIS — T45515A Adverse effect of anticoagulants, initial encounter: Secondary | ICD-10-CM | POA: Diagnosis not present

## 2011-08-14 DIAGNOSIS — M810 Age-related osteoporosis without current pathological fracture: Secondary | ICD-10-CM | POA: Diagnosis present

## 2011-08-14 DIAGNOSIS — E119 Type 2 diabetes mellitus without complications: Secondary | ICD-10-CM

## 2011-08-14 DIAGNOSIS — Z8249 Family history of ischemic heart disease and other diseases of the circulatory system: Secondary | ICD-10-CM

## 2011-08-14 DIAGNOSIS — E876 Hypokalemia: Secondary | ICD-10-CM | POA: Diagnosis not present

## 2011-08-14 DIAGNOSIS — I70229 Atherosclerosis of native arteries of extremities with rest pain, unspecified extremity: Secondary | ICD-10-CM

## 2011-08-14 DIAGNOSIS — J449 Chronic obstructive pulmonary disease, unspecified: Secondary | ICD-10-CM

## 2011-08-14 DIAGNOSIS — W010XXA Fall on same level from slipping, tripping and stumbling without subsequent striking against object, initial encounter: Secondary | ICD-10-CM | POA: Diagnosis present

## 2011-08-14 DIAGNOSIS — D62 Acute posthemorrhagic anemia: Secondary | ICD-10-CM | POA: Diagnosis not present

## 2011-08-14 DIAGNOSIS — D696 Thrombocytopenia, unspecified: Secondary | ICD-10-CM

## 2011-08-14 DIAGNOSIS — Z7982 Long term (current) use of aspirin: Secondary | ICD-10-CM

## 2011-08-14 DIAGNOSIS — J4489 Other specified chronic obstructive pulmonary disease: Secondary | ICD-10-CM | POA: Diagnosis present

## 2011-08-14 DIAGNOSIS — J438 Other emphysema: Secondary | ICD-10-CM

## 2011-08-14 HISTORY — DX: Age-related osteoporosis without current pathological fracture: M81.0

## 2011-08-14 HISTORY — DX: Rheumatoid arthritis, unspecified: M06.9

## 2011-08-14 LAB — CBC WITH DIFFERENTIAL/PLATELET
Basophils Absolute: 0 10*3/uL (ref 0.0–0.1)
Basophils Relative: 0 % (ref 0–1)
Eosinophils Relative: 0 % (ref 0–5)
HCT: 38 % — ABNORMAL LOW (ref 39.0–52.0)
MCHC: 34.2 g/dL (ref 30.0–36.0)
MCV: 96.2 fL (ref 78.0–100.0)
Monocytes Absolute: 0.7 10*3/uL (ref 0.1–1.0)
RDW: 12.7 % (ref 11.5–15.5)

## 2011-08-14 LAB — URINALYSIS, ROUTINE W REFLEX MICROSCOPIC
Bilirubin Urine: NEGATIVE
Leukocytes, UA: NEGATIVE
Nitrite: NEGATIVE
Specific Gravity, Urine: 1.013 (ref 1.005–1.030)
Urobilinogen, UA: 1 mg/dL (ref 0.0–1.0)
pH: 7.5 (ref 5.0–8.0)

## 2011-08-14 LAB — BASIC METABOLIC PANEL
Calcium: 9.6 mg/dL (ref 8.4–10.5)
Creatinine, Ser: 0.89 mg/dL (ref 0.50–1.35)
GFR calc Af Amer: >90 mL/min (ref >90)
GFR calc non Af Amer: 82 mL/min — ABNORMAL LOW (ref >90)

## 2011-08-14 LAB — CARDIAC PANEL(CRET KIN+CKTOT+MB+TROPI)
CK, MB: 2.5 ng/mL (ref 0.3–4.0)
Total CK: 59 U/L (ref 7–232)
Troponin I: <0.3 ng/mL (ref <0.30)

## 2011-08-14 MED ORDER — ENOXAPARIN SODIUM 40 MG/0.4ML ~~LOC~~ SOLN
40.0000 mg | SUBCUTANEOUS | Status: DC
  Administered 2011-08-14 – 2011-08-15 (×2): 40 mg via SUBCUTANEOUS
  Filled 2011-08-14 (×3): qty 0.4

## 2011-08-14 MED ORDER — HYDROMORPHONE HCL PF 1 MG/ML IJ SOLN
1.0000 mg | Freq: Once | INTRAMUSCULAR | Status: AC
  Administered 2011-08-14: 1 mg via INTRAVENOUS
  Filled 2011-08-14: qty 1

## 2011-08-14 MED ORDER — ONDANSETRON HCL 4 MG/2ML IJ SOLN: 4.0000 mg | Freq: Three times a day (TID) | INTRAMUSCULAR | Status: AC | PRN

## 2011-08-14 MED ORDER — ALPRAZOLAM 0.5 MG PO TABS
1.0000 mg | ORAL_TABLET | Freq: Every evening | ORAL | Status: DC | PRN
Start: 2011-08-14 — End: 2011-08-19
  Administered 2011-08-15 – 2011-08-18 (×3): 1 mg via ORAL
  Filled 2011-08-14 (×3): qty 2

## 2011-08-14 MED ORDER — FLUTICASONE-SALMETEROL 250-50 MCG/DOSE IN AEPB
1.0000 | INHALATION_SPRAY | Freq: Two times a day (BID) | RESPIRATORY_TRACT | Status: DC
  Administered 2011-08-14 – 2011-08-18 (×8): 1 via RESPIRATORY_TRACT
  Filled 2011-08-14: qty 14

## 2011-08-14 MED ORDER — SODIUM CHLORIDE 0.9 % IV SOLN: INTRAVENOUS | Status: AC

## 2011-08-14 MED ORDER — ACETAMINOPHEN 650 MG RE SUPP: 650.0000 mg | Freq: Four times a day (QID) | RECTAL | Status: DC | PRN

## 2011-08-14 MED ORDER — SODIUM CHLORIDE 0.9 % IJ SOLN
3.0000 mL | Freq: Two times a day (BID) | INTRAMUSCULAR | Status: DC
  Administered 2011-08-15 – 2011-08-19 (×7): 3 mL via INTRAVENOUS

## 2011-08-14 MED ORDER — OXYCODONE-ACETAMINOPHEN 5-325 MG PO TABS
1.0000 | ORAL_TABLET | ORAL | Status: DC | PRN
  Administered 2011-08-14 – 2011-08-15 (×2): 1 via ORAL
  Filled 2011-08-14 (×2): qty 1

## 2011-08-14 MED ORDER — HYDROMORPHONE HCL PF 1 MG/ML IJ SOLN
1.0000 mg | INTRAMUSCULAR | Status: AC
  Administered 2011-08-14: 1 mg via INTRAVENOUS

## 2011-08-14 MED ORDER — HYDROMORPHONE HCL PF 1 MG/ML IJ SOLN
1.0000 mg | INTRAMUSCULAR | Status: AC | PRN
  Administered 2011-08-14 – 2011-08-15 (×2): 1 mg via INTRAVENOUS
  Filled 2011-08-14 (×3): qty 1

## 2011-08-14 MED ORDER — SODIUM CHLORIDE 0.9 % IV SOLN
INTRAVENOUS | Status: DC
  Administered 2011-08-14: 50 mL/h via INTRAVENOUS
  Administered 2011-08-15 (×2): via INTRAVENOUS

## 2011-08-14 MED ORDER — ACETAMINOPHEN 325 MG PO TABS: 650.0000 mg | ORAL_TABLET | Freq: Four times a day (QID) | ORAL | Status: DC | PRN

## 2011-08-14 NOTE — Consult Note (Signed)
ORTHOPAEDIC CONSULTATION  REQUESTING PHYSICIAN: Kathlen Mody, MD  Chief Complaint: Right hip pain  HPI: [name] is a 77 y.o. male who complains of  right hip pain after a fall today. Is not exactly clear how he fell, but it sounds like he tripped. There was questionable loss of consciousness. He has had multiple falls recently, and has deterioration in his ambulatory function. He has a long history of problems with the right lower extremity, including a knee arthrodesis after multiple operations. The arthrodesis was performed in the 1970s. He complains of acute severe pain over the right hip to better with IV pain medication.  He also has a significant vascular problem in both of his lower extremities, however the left one was able to be stented, but the right one was unable to be stented. He lives with severely impaired circulation, with a nonpalpable pulse on the right side.  Past Medical History  Diagnosis Date  . Hyperlipidemia   . Chronic back pain   . History of colon polyps   . Spinal stenosis   . Cervical spondylosis   . Cancer 2004    prostate  . Peripheral neuropathy   . Anxiety   . COPD (chronic obstructive pulmonary disease)   . BPH (benign prostatic hypertrophy)   . ED (erectile dysfunction)   . Pernicious anemia   . Peripheral vascular disease   . Rheumatoid arthritis   . Osteoporosis    Past Surgical History  Procedure Date  . Basal cell carcinoma excision     Nose x 3  . Spine surgery     Cervical Spine X 4 levels  . Cholecystectomy   . Wrist fusion     Right Wrist  . Hemorroidectomy   . Knee surgery     X 7 surgeries  . Iliac artery stent 12-07-09    Stent done by Dr. Allyson Sabal   History   Social History  . Marital Status: Widowed    Spouse Name: N/A    Number of Children: N/A  . Years of Education: N/A   Social History Main Topics  . Smoking status: Current Everyday Smoker -- 1.0 packs/day  . Smokeless tobacco: None  . Alcohol Use: No  .  Drug Use: No  . Sexually Active:    Other Topics Concern  . None   Social History Narrative  . None   Family History  Problem Relation Age of Onset  . Hypertension Mother   . Cancer Mother     colon cancer   Allergies  Allergen Reactions  . Lipitor (Atorvastatin Calcium)     myalgias  . Tiotropium Bromide Monohydrate     hoarseness   Prior to Admission medications   Medication Sig Start Date End Date Taking? Authorizing Provider  ALPRAZolam Prudy Feeler) 1 MG tablet Take 1 mg by mouth at bedtime as needed. For anxiety/sleep.   Yes Historical Provider, MD  Cyanocobalamin (VITAMIN B-12 IJ) Inject 1 each as directed every 30 (thirty) days.   Yes Historical Provider, MD  Fluticasone-Salmeterol (ADVAIR DISKUS) 250-50 MCG/DOSE AEPB Inhale 1 puff into the lungs every 12 (twelve) hours.   Yes Historical Provider, MD  Multiple Vitamin (MULTIVITAMIN) tablet Take 1 tablet by mouth daily.   Yes Historical Provider, MD  oxyCODONE-acetaminophen (PERCOCET) 5-325 MG per tablet Take 1 tablet by mouth every 4 (four) hours as needed. For pain.   Yes Historical Provider, MD  rosuvastatin (CRESTOR) 20 MG tablet Take 20 mg by mouth daily.   Yes  Historical Provider, MD  sildenafil (VIAGRA) 100 MG tablet Take 100 mg by mouth daily as needed. For E.D.   Yes Historical Provider, MD   Dg Chest 1 View  08/14/2011  *RADIOLOGY REPORT*  Clinical Data: Fall.  Right hip pain.  CHEST - 1 VIEW  Comparison: 12/01/2009  Findings: Atherosclerotic aorta noted with chronic scarring along the left lateral hemidiaphragm.  Prominent emphysema noted.  A right posterolateral 2nd rib fracture may be acute - correlate with tenderness in this vicinity.  The right anterolateral ninth rib fracture is more likely old given the appearance of callus.  No pneumothorax observed.  IMPRESSION:  1.  Discontinuous right second rib, possibly acute fracture - correlate with tenderness in this vicinity. 2.  Emphysema. 3.  Chronic scarring along the  left lateral costophrenic angle.  Original Report Authenticated By: Dellia Cloud, M.D.   Dg Hip Complete Right  08/14/2011  *RADIOLOGY REPORT*  Clinical Data: Fall.  Right hip pain.  RIGHT HIP - COMPLETE 2+ VIEW  Comparison: 04/26/2011  Findings: Acute right femoral neck fracture noted, with a small fragment along the medial side of the fracture, and equivocal varus angulation.  Spurring from the anterior inferior iliac spines noted.  A left common iliac artery stent is present. Old, healed fracture of the left inferior pubic ramus observed.  IMPRESSION:  1.  Acute right femoral neck fracture.  Original Report Authenticated By: Dellia Cloud, M.D.   Dg Femur Right  08/14/2011  *RADIOLOGY REPORT*  Clinical Data: Fall onto right side.  Right hip pain.  RIGHT FEMUR - 2 VIEW  Comparison: None.  Findings: Right femoral neck fracture noted. No other fracture is observed.  Bridging bony fusion noted between the femur and tibia, and femur and patella.  Atherosclerosis noted.  IMPRESSION:  1.  Acute right femoral neck fracture. 2.  Prior knee arthrodesis.  Original Report Authenticated By: Dellia Cloud, M.D.   Ct Head Wo Contrast  08/14/2011  *RADIOLOGY REPORT*  Clinical Data:  FALL EXTREMITY PAIN,fall.  CT HEAD WITHOUT CONTRAST CT CERVICAL SPINE WITHOUT CONTRAST  Technique:  Multidetector CT imaging of the head and cervical spine was performed following the standard protocol without IV contrast. Multiplanar CT image reconstructions of the cervical spine were also generated.  Comparison: None  CT HEAD  Findings: Atherosclerotic and physiologic intracranial calcifications.  Mild atrophy. There is no evidence of acute intracranial hemorrhage, brain edema, mass lesion, acute infarction,   mass effect, or midline shift. Acute infarct may be inapparent on noncontrast CT.  No other intra-axial abnormalities are seen, and the ventricles and sulci are within normal limits in size and symmetry.   No  abnormal extra-axial fluid collections or masses are identified.  No significant calvarial abnormality.  IMPRESSION: 1.  Negative CT head.  CT CERVICAL SPINE  Findings: Mild reversal of the normal cervical lordosis. There is a central protrusion C2-3.  Solid appearing fusion across the C3-4 interspace.  Previous instrumented ACDF C4-5, hardware intact without surrounding lucency.  There is persistent lucency across the interspace however.  There is solid appearing interbody fusion C5-6 and C6-7, as well as fusion across the posterior spinous processes of C5-6.  Negative for fracture.  No prevertebral soft tissue swelling.  Emphysematous changes in the visualized lung apices.  Calcified right carotid bifurcation plaque.  IMPRESSION:  1.  Negative for fracture or other acute bony abnormality. 2.  Postoperative changes C3-C7 as above. 3. Loss of the normal cervical spine lordosis, which may  be secondary to positioning, spasm, or soft tissue injury. 4.  Right carotid bifurcation plaque.  Original Report Authenticated By: Osa Craver, M.D.   Ct Cervical Spine Wo Contrast  08/14/2011  *RADIOLOGY REPORT*  Clinical Data:  FALL EXTREMITY PAIN,fall.  CT HEAD WITHOUT CONTRAST CT CERVICAL SPINE WITHOUT CONTRAST  Technique:  Multidetector CT imaging of the head and cervical spine was performed following the standard protocol without IV contrast. Multiplanar CT image reconstructions of the cervical spine were also generated.  Comparison: None  CT HEAD  Findings: Atherosclerotic and physiologic intracranial calcifications.  Mild atrophy. There is no evidence of acute intracranial hemorrhage, brain edema, mass lesion, acute infarction,   mass effect, or midline shift. Acute infarct may be inapparent on noncontrast CT.  No other intra-axial abnormalities are seen, and the ventricles and sulci are within normal limits in size and symmetry.   No abnormal extra-axial fluid collections or masses are identified.  No  significant calvarial abnormality.  IMPRESSION: 1.  Negative CT head.  CT CERVICAL SPINE  Findings: Mild reversal of the normal cervical lordosis. There is a central protrusion C2-3.  Solid appearing fusion across the C3-4 interspace.  Previous instrumented ACDF C4-5, hardware intact without surrounding lucency.  There is persistent lucency across the interspace however.  There is solid appearing interbody fusion C5-6 and C6-7, as well as fusion across the posterior spinous processes of C5-6.  Negative for fracture.  No prevertebral soft tissue swelling.  Emphysematous changes in the visualized lung apices.  Calcified right carotid bifurcation plaque.  IMPRESSION:  1.  Negative for fracture or other acute bony abnormality. 2.  Postoperative changes C3-C7 as above. 3. Loss of the normal cervical spine lordosis, which may be secondary to positioning, spasm, or soft tissue injury. 4.  Right carotid bifurcation plaque.  Original Report Authenticated By: Osa Craver, M.D.    Positive ROS: All other systems have been reviewed and were otherwise negative with the exception of those mentioned in the HPI and as above.  Physical Exam: General: Alert, no acute distress Cardiovascular: No pedal edema, and he has no palpable pulse on the right side. I can palpate a pulse on the left side. He has characteristic changes in his lower extremities consistent with severe peripheral vascular disease with hair loss and shiny skin. Respiratory: No cyanosis, no use of accessory musculature GI: No organomegaly, abdomen is soft and non-tender Skin: No lesions in the area of chief complaint, with the exception of some mild bruising. Neurologic: Sensation intact distally Psychiatric: Patient is competent for consent with normal mood and affect Lymphatic: No axillary or cervical lymphadenopathy  MUSCULOSKELETAL: Right lower extremity EHL and FHL are firing. His right knee is completely fused, and has absolutely no  motion. Sensation is grossly intact throughout his foot. As indicated above he does not have a pulse of the right side.  Assessment: Right femoral neck fracture, mild varus alignment, although it looks reasonably well aligned on the lateral as best I can tell. He has coexisting severe vascular disease as well as a right knee arthrodesis, and multiple other medical problems.  Plan: This is an extremely complicated and difficult problem that carries high risk both for morbidity as well as mortality. My standard recommendation would be for a hip hemiarthroplasty, however this carries substantial risks, and I am concerned given his knee effusion, that he is not eligible for a standard posterolateral approach. Additionally, I am concerned that hip arthroplasty may jeopardize the vascularity of  his lower extremity, and it would be worth having a discussion with his vascular surgeon regarding this. The only way that he could undergo arthroplasty given the knee arthrodesis would be through an anterior approach, because the knee does not have to be done for that approach, whereas it does have to be bent for a posterolateral approach, and I have consulted Dr. Doneen Poisson, to evaluate to see if he is a candidate for this. Alternatively, hip pinning may be a lunar risk possibility, although the risk for nonunion as well as hardware failure is substantial given his coexisting risk factors as well as ongoing tobacco abuse. I may get a CAT scan of his right hip to better assess the stability of the fracture and pattern of the fracture, and will discuss this further with his primary orthopedist, Dr. Eulah Pont, as well as Dr. Magnus Ivan. I am not yet sure on the timing of his operation, and it will depend on the results of the CAT scan, as well as the further discussion.    Shirlyn Savin P, MD Cell [phone] Pager [phone]  08/14/2011 9:58 PM

## 2011-08-14 NOTE — ED Notes (Signed)
Report given to Sherlynn Stalls., RN

## 2011-08-14 NOTE — ED Provider Notes (Signed)
History     CSN: [id number]  Arrival date & time 08/14/11  1121   First MD Initiated Contact with Patient 08/14/11 1123      Chief Complaint  Patient presents with  . Fall  . Extremity Pain    (Consider location/radiation/quality/duration/timing/severity/associated sxs/prior treatment) HPI Comments: History rheumatoid arthritis, PVD, COPD presenting with right hip pain after mechanical fall around 6 AM. Patient states he was trying to get to bed and tripped and landed on the right side of his body. He was unable to contact family members for several hours. He denies losing consciousness. No weakness, numbness, tingling. No headache, chest pain, back pain or abdominal pain. He does have chronic neck pain and chronic lower extremity pain from vascular insufficiency. He's been unable to bear weight since the fall  The history is provided by the patient and the EMS personnel.    Past Medical History  Diagnosis Date  . Hyperlipidemia   . Chronic back pain   . History of colon polyps   . Spinal stenosis   . Cervical spondylosis   . Cancer 2004    prostate  . Peripheral neuropathy   . Anxiety   . COPD (chronic obstructive pulmonary disease)   . BPH (benign prostatic hypertrophy)   . ED (erectile dysfunction)   . Pernicious anemia   . Peripheral vascular disease   . Rheumatoid arthritis   . Osteoporosis     Past Surgical History  Procedure Date  . Basal cell carcinoma excision     Nose x 3  . Spine surgery     Cervical Spine X 4 levels  . Cholecystectomy   . Wrist fusion     Right Wrist  . Hemorroidectomy   . Knee surgery     X 7 surgeries  . Iliac artery stent 12-07-09    Stent done by Dr. Allyson Sabal    Family History  Problem Relation Age of Onset  . Hypertension Mother   . Cancer Mother     colon cancer    History  Substance Use Topics  . Smoking status: Current Everyday Smoker -- 1.0 packs/day  . Smokeless tobacco: Not on file  . Alcohol Use: No       Review of Systems  Constitutional: Negative for activity change and appetite change.  HENT: Positive for neck pain. Negative for congestion and rhinorrhea.   Respiratory: Negative for cough, chest tightness and shortness of breath.   Cardiovascular: Negative for chest pain.  Gastrointestinal: Negative for nausea, vomiting and abdominal pain.  Genitourinary: Negative for dysuria and hematuria.  Musculoskeletal: Positive for myalgias, back pain, arthralgias and gait problem.  Skin: Negative for rash.  Neurological: Negative for dizziness, weakness and headaches.    Allergies  Lipitor and Tiotropium bromide monohydrate  Home Medications   No current outpatient prescriptions on file.  BP 153/64  Pulse 81  Temp 98.3 F (36.8 C) (Oral)  Resp 18  Ht 5\' 8"  (1.727 m)  Wt 128 lb (58.06 kg)  BMI 19.46 kg/m2  SpO2 93%  Physical Exam  Constitutional: He is oriented to person, place, and time. He appears well-developed. No distress.  HENT:  Head: Normocephalic and atraumatic.  Mouth/Throat: Oropharynx is clear and moist. No oropharyngeal exudate.  Eyes: Conjunctivae and EOM are normal. Pupils are equal, round, and reactive to light.  Neck: Normal range of motion.       Diffuse C-spine pain without step-off or deformity  Cardiovascular: Normal rate, regular rhythm and normal heart  sounds.   No murmur heard. Pulmonary/Chest: Effort normal and breath sounds normal. No respiratory distress.  Abdominal: Soft. There is no tenderness. There is no rebound.  Musculoskeletal: He exhibits edema and tenderness.       Right lower extremity shortened and externally rotated. Absent R femoral pulse (chronic per vascular surgery notes). Dopplerable femoral, PT, DP pulses on R +2 palpable pulses on L. TTP R lateral hip with reduced ROM 2/2 pain.  Pain with logrolling.  Neurological: He is alert and oriented to person, place, and time. No cranial nerve deficit.  Skin: Skin is warm.    ED  Course  Procedures (including critical care time)  Labs Reviewed  CBC WITH DIFFERENTIAL - Abnormal; Notable for the following:    RBC 3.95 (*)     HCT 38.0 (*)     Platelets 138 (*)     Neutrophils Relative 84 (*)     Neutro Abs 7.9 (*)     Lymphocytes Relative 8 (*)     All other components within normal limits  BASIC METABOLIC PANEL - Abnormal; Notable for the following:    Glucose, Bld 127 (*)     GFR calc non Af Amer 82 (*)     All other components within normal limits  CARDIAC PANEL(CRET KIN+CKTOT+MB+TROPI)  PROTIME-INR   Dg Chest 1 View  08/14/2011  *RADIOLOGY REPORT*  Clinical Data: Fall.  Right hip pain.  CHEST - 1 VIEW  Comparison: 12/01/2009  Findings: Atherosclerotic aorta noted with chronic scarring along the left lateral hemidiaphragm.  Prominent emphysema noted.  A right posterolateral 2nd rib fracture may be acute - correlate with tenderness in this vicinity.  The right anterolateral ninth rib fracture is more likely old given the appearance of callus.  No pneumothorax observed.  IMPRESSION:  1.  Discontinuous right second rib, possibly acute fracture - correlate with tenderness in this vicinity. 2.  Emphysema. 3.  Chronic scarring along the left lateral costophrenic angle.  Original Report Authenticated By: Dellia Cloud, M.D.   Dg Hip Complete Right  08/14/2011  *RADIOLOGY REPORT*  Clinical Data: Fall.  Right hip pain.  RIGHT HIP - COMPLETE 2+ VIEW  Comparison: 04/26/2011  Findings: Acute right femoral neck fracture noted, with a small fragment along the medial side of the fracture, and equivocal varus angulation.  Spurring from the anterior inferior iliac spines noted.  A left common iliac artery stent is present. Old, healed fracture of the left inferior pubic ramus observed.  IMPRESSION:  1.  Acute right femoral neck fracture.  Original Report Authenticated By: Dellia Cloud, M.D.   Dg Femur Right  08/14/2011  *RADIOLOGY REPORT*  Clinical Data: Fall onto  right side.  Right hip pain.  RIGHT FEMUR - 2 VIEW  Comparison: None.  Findings: Right femoral neck fracture noted. No other fracture is observed.  Bridging bony fusion noted between the femur and tibia, and femur and patella.  Atherosclerosis noted.  IMPRESSION:  1.  Acute right femoral neck fracture. 2.  Prior knee arthrodesis.  Original Report Authenticated By: Dellia Cloud, M.D.   Ct Head Wo Contrast  08/14/2011  *RADIOLOGY REPORT*  Clinical Data:  FALL EXTREMITY PAIN,fall.  CT HEAD WITHOUT CONTRAST CT CERVICAL SPINE WITHOUT CONTRAST  Technique:  Multidetector CT imaging of the head and cervical spine was performed following the standard protocol without IV contrast. Multiplanar CT image reconstructions of the cervical spine were also generated.  Comparison: None  CT HEAD  Findings: Atherosclerotic and  physiologic intracranial calcifications.  Mild atrophy. There is no evidence of acute intracranial hemorrhage, brain edema, mass lesion, acute infarction,   mass effect, or midline shift. Acute infarct may be inapparent on noncontrast CT.  No other intra-axial abnormalities are seen, and the ventricles and sulci are within normal limits in size and symmetry.   No abnormal extra-axial fluid collections or masses are identified.  No significant calvarial abnormality.  IMPRESSION: 1.  Negative CT head.  CT CERVICAL SPINE  Findings: Mild reversal of the normal cervical lordosis. There is a central protrusion C2-3.  Solid appearing fusion across the C3-4 interspace.  Previous instrumented ACDF C4-5, hardware intact without surrounding lucency.  There is persistent lucency across the interspace however.  There is solid appearing interbody fusion C5-6 and C6-7, as well as fusion across the posterior spinous processes of C5-6.  Negative for fracture.  No prevertebral soft tissue swelling.  Emphysematous changes in the visualized lung apices.  Calcified right carotid bifurcation plaque.  IMPRESSION:  1.   Negative for fracture or other acute bony abnormality. 2.  Postoperative changes C3-C7 as above. 3. Loss of the normal cervical spine lordosis, which may be secondary to positioning, spasm, or soft tissue injury. 4.  Right carotid bifurcation plaque.  Original Report Authenticated By: Osa Craver, M.D.   Ct Cervical Spine Wo Contrast  08/14/2011  *RADIOLOGY REPORT*  Clinical Data:  FALL EXTREMITY PAIN,fall.  CT HEAD WITHOUT CONTRAST CT CERVICAL SPINE WITHOUT CONTRAST  Technique:  Multidetector CT imaging of the head and cervical spine was performed following the standard protocol without IV contrast. Multiplanar CT image reconstructions of the cervical spine were also generated.  Comparison: None  CT HEAD  Findings: Atherosclerotic and physiologic intracranial calcifications.  Mild atrophy. There is no evidence of acute intracranial hemorrhage, brain edema, mass lesion, acute infarction,   mass effect, or midline shift. Acute infarct may be inapparent on noncontrast CT.  No other intra-axial abnormalities are seen, and the ventricles and sulci are within normal limits in size and symmetry.   No abnormal extra-axial fluid collections or masses are identified.  No significant calvarial abnormality.  IMPRESSION: 1.  Negative CT head.  CT CERVICAL SPINE  Findings: Mild reversal of the normal cervical lordosis. There is a central protrusion C2-3.  Solid appearing fusion across the C3-4 interspace.  Previous instrumented ACDF C4-5, hardware intact without surrounding lucency.  There is persistent lucency across the interspace however.  There is solid appearing interbody fusion C5-6 and C6-7, as well as fusion across the posterior spinous processes of C5-6.  Negative for fracture.  No prevertebral soft tissue swelling.  Emphysematous changes in the visualized lung apices.  Calcified right carotid bifurcation plaque.  IMPRESSION:  1.  Negative for fracture or other acute bony abnormality. 2.  Postoperative  changes C3-C7 as above. 3. Loss of the normal cervical spine lordosis, which may be secondary to positioning, spasm, or soft tissue injury. 4.  Right carotid bifurcation plaque.  Original Report Authenticated By: Thora Lance III, M.D.     1. Femoral neck fracture   2. Fall       MDM  Mechanical fall with R hip pain.  Pulse deficits at baseline (no intervention per last vascular surgery note).  No LOC, no neuro deficitis.  Isolated hip fracture without neuro deficits. Vascular deficits at baseline.  R femoral neck fracture discussed with Dr. Dion Saucier of [facility].  Glynn Octave, MD 08/14/11 [phone]

## 2011-08-14 NOTE — H&P (Signed)
[facility] History and Physical  FINBAR NIPPERT HQI:[id number] DOB: 10-31-1934 DOA: 08/14/2011   PCP: Thora Lance, MD   Chief Complaint: fall HPI:   77 year old male with h/o COPD, PVD, rheumatoid arthritis, came in after a fall this morning. He reports he got up from the bed, tripped and fell, but also felt dizzy, and passed out for a few minutes. He was on the floor for 4 hours before he called for help. His family at bedside. He denies any chest pain, palpitations, he reports being nauseated this morning. No abdominal pain, diarrhea or constipation. No urinary complaints. No cardiac history. He continues to smoke. He has a h/o severe PVD, and reports he saw vascular surgery and cardiologist,but no intervention planned in the future. On arrival to ED he was found to have femur neck fracture. Ortho consult was called and plan for surgery tomorrow am.  Review of Systems:  See HPI otherwise negative.   Past Medical History  Diagnosis Date  . Hyperlipidemia   . Chronic back pain   . History of colon polyps   . Spinal stenosis   . Cervical spondylosis   . Cancer 2004    prostate  . Peripheral neuropathy   . Anxiety   . COPD (chronic obstructive pulmonary disease)   . BPH (benign prostatic hypertrophy)   . ED (erectile dysfunction)   . Pernicious anemia   . Peripheral vascular disease   . Rheumatoid arthritis   . Osteoporosis    Past Surgical History  Procedure Date  . Basal cell carcinoma excision     Nose x 3  . Spine surgery     Cervical Spine X 4 levels  . Cholecystectomy   . Wrist fusion     Right Wrist  . Hemorroidectomy   . Knee surgery     X 7 surgeries  . Iliac artery stent 12-07-09    Stent done by Dr. Allyson Sabal   Social History:  reports that he has been smoking.  He does not have any smokeless tobacco history on file. He reports that he does not drink alcohol or use illicit drugs.  Allergies  Allergen Reactions  . Lipitor (Atorvastatin Calcium)    myalgias  . Tiotropium Bromide Monohydrate     hoarseness    Family History  Problem Relation Age of Onset  . Hypertension Mother   . Cancer Mother     colon cancer    Prior to Admission medications   Medication Sig Start Date End Date Taking? Authorizing Provider  ALPRAZolam Prudy Feeler) 1 MG tablet Take 1 mg by mouth at bedtime as needed. For anxiety/sleep.   Yes Historical Provider, MD  Cyanocobalamin (VITAMIN B-12 IJ) Inject 1 each as directed every 30 (thirty) days.   Yes Historical Provider, MD  Fluticasone-Salmeterol (ADVAIR DISKUS) 250-50 MCG/DOSE AEPB Inhale 1 puff into the lungs every 12 (twelve) hours.   Yes Historical Provider, MD  Multiple Vitamin (MULTIVITAMIN) tablet Take 1 tablet by mouth daily.   Yes Historical Provider, MD  oxyCODONE-acetaminophen (PERCOCET) 5-325 MG per tablet Take 1 tablet by mouth every 4 (four) hours as needed. For pain.   Yes Historical Provider, MD  rosuvastatin (CRESTOR) 20 MG tablet Take 20 mg by mouth daily.   Yes Historical Provider, MD  sildenafil (VIAGRA) 100 MG tablet Take 100 mg by mouth daily as needed. For E.D.   Yes Historical Provider, MD   Physical Exam: Filed Vitals:   08/14/11 1131 08/14/11 1530 08/14/11 1615  BP:  144/82 158/68 153/64  Pulse: 91 87 81  Temp: 98.3 F (36.8 C)    TempSrc: Oral    Resp: 18    Height: 5\' 8"  (1.727 m)    Weight: 58.06 kg (128 lb)    SpO2: 93% 96% 93%    Constitutional: Vital signs reviewed.  Patient is a well-developed and poorly nourished in mild distress and cooperative with exam. Alert and oriented x3.  Head: Normocephalic and atraumatic Mouth: no erythema or exudates, dry MM Eyes: PERRL, EOMI, conjunctivae normal, No scleral icterus.  Neck: Supple, Trachea midline normal ROM, No JVD, mass, thyromegaly, or carotid bruit present.  Cardiovascular: RRR, S1 normal, S2 normal, no MRG, decreased femoral pulses bilaterally  Pulmonary/Chest: CTAB, no wheezes, rales, or rhonchi Abdominal: Soft.tender  in the RLQ, probably referred pain from right femur.  non-distended, bowel sounds are normal, no masses, organomegaly, or guarding present.  Musculoskeletal: tenderness of the right hip. Neurological: A&O x3, Strenght is normal and symmetric bilaterally, cranial nerve II-XII are grossly intact, no focal motor deficit, sensory intact to light touch bilaterally.  Skin: Warm, dry and intact. No rash, cyanosis, or clubbing.  Psychiatric: Normal mood and affect.   Labs on Admission:  Basic Metabolic Panel:  Lab 08/14/11 1610  NA 136  K 4.0  CL 96  CO2 28  GLUCOSE 127*  BUN 11  CREATININE 0.89  CALCIUM 9.6  MG --  PHOS --   Liver Function Tests: No results found for this basename: AST:5,ALT:5,ALKPHOS:5,BILITOT:5,PROT:5,ALBUMIN:5 in the last 168 hours No results found for this basename: LIPASE:5,AMYLASE:5 in the last 168 hours No results found for this basename: AMMONIA:5 in the last 168 hours CBC:  Lab 08/14/11 1330  WBC 9.4  NEUTROABS 7.9*  HGB 13.0  HCT 38.0*  MCV 96.2  PLT 138*   Cardiac Enzymes:  Lab 08/14/11 1330  CKTOTAL 59  CKMB 2.5  CKMBINDEX --  TROPONINI <0.30   BNP: No components found with this basename: POCBNP:5 CBG: No results found for this basename: GLUCAP:5 in the last 168 hours  Radiological Exams on Admission: Dg Chest 1 View  08/14/2011  *RADIOLOGY REPORT*  Clinical Data: Fall.  Right hip pain.  CHEST - 1 VIEW  Comparison: 12/01/2009  Findings: Atherosclerotic aorta noted with chronic scarring along the left lateral hemidiaphragm.  Prominent emphysema noted.  A right posterolateral 2nd rib fracture may be acute - correlate with tenderness in this vicinity.  The right anterolateral ninth rib fracture is more likely old given the appearance of callus.  No pneumothorax observed.  IMPRESSION:  1.  Discontinuous right second rib, possibly acute fracture - correlate with tenderness in this vicinity. 2.  Emphysema. 3.  Chronic scarring along the left lateral  costophrenic angle.  Original Report Authenticated By: Dellia Cloud, M.D.   Dg Hip Complete Right  08/14/2011  *RADIOLOGY REPORT*  Clinical Data: Fall.  Right hip pain.  RIGHT HIP - COMPLETE 2+ VIEW  Comparison: 04/26/2011  Findings: Acute right femoral neck fracture noted, with a small fragment along the medial side of the fracture, and equivocal varus angulation.  Spurring from the anterior inferior iliac spines noted.  A left common iliac artery stent is present. Old, healed fracture of the left inferior pubic ramus observed.  IMPRESSION:  1.  Acute right femoral neck fracture.  Original Report Authenticated By: Dellia Cloud, M.D.   Dg Femur Right  08/14/2011  *RADIOLOGY REPORT*  Clinical Data: Fall onto right side.  Right hip pain.  RIGHT  FEMUR - 2 VIEW  Comparison: None.  Findings: Right femoral neck fracture noted. No other fracture is observed.  Bridging bony fusion noted between the femur and tibia, and femur and patella.  Atherosclerosis noted.  IMPRESSION:  1.  Acute right femoral neck fracture. 2.  Prior knee arthrodesis.  Original Report Authenticated By: Dellia Cloud, M.D.   Ct Head Wo Contrast  08/14/2011  *RADIOLOGY REPORT*  Clinical Data:  FALL EXTREMITY PAIN,fall.  CT HEAD WITHOUT CONTRAST CT CERVICAL SPINE WITHOUT CONTRAST  Technique:  Multidetector CT imaging of the head and cervical spine was performed following the standard protocol without IV contrast. Multiplanar CT image reconstructions of the cervical spine were also generated.  Comparison: None  CT HEAD  Findings: Atherosclerotic and physiologic intracranial calcifications.  Mild atrophy. There is no evidence of acute intracranial hemorrhage, brain edema, mass lesion, acute infarction,   mass effect, or midline shift. Acute infarct may be inapparent on noncontrast CT.  No other intra-axial abnormalities are seen, and the ventricles and sulci are within normal limits in size and symmetry.   No abnormal  extra-axial fluid collections or masses are identified.  No significant calvarial abnormality.  IMPRESSION: 1.  Negative CT head.  CT CERVICAL SPINE  Findings: Mild reversal of the normal cervical lordosis. There is a central protrusion C2-3.  Solid appearing fusion across the C3-4 interspace.  Previous instrumented ACDF C4-5, hardware intact without surrounding lucency.  There is persistent lucency across the interspace however.  There is solid appearing interbody fusion C5-6 and C6-7, as well as fusion across the posterior spinous processes of C5-6.  Negative for fracture.  No prevertebral soft tissue swelling.  Emphysematous changes in the visualized lung apices.  Calcified right carotid bifurcation plaque.  IMPRESSION:  1.  Negative for fracture or other acute bony abnormality. 2.  Postoperative changes C3-C7 as above. 3. Loss of the normal cervical spine lordosis, which may be secondary to positioning, spasm, or soft tissue injury. 4.  Right carotid bifurcation plaque.  Original Report Authenticated By: Osa Craver, M.D.   Ct Cervical Spine Wo Contrast  08/14/2011  *RADIOLOGY REPORT*  Clinical Data:  FALL EXTREMITY PAIN,fall.  CT HEAD WITHOUT CONTRAST CT CERVICAL SPINE WITHOUT CONTRAST  Technique:  Multidetector CT imaging of the head and cervical spine was performed following the standard protocol without IV contrast. Multiplanar CT image reconstructions of the cervical spine were also generated.  Comparison: None  CT HEAD  Findings: Atherosclerotic and physiologic intracranial calcifications.  Mild atrophy. There is no evidence of acute intracranial hemorrhage, brain edema, mass lesion, acute infarction,   mass effect, or midline shift. Acute infarct may be inapparent on noncontrast CT.  No other intra-axial abnormalities are seen, and the ventricles and sulci are within normal limits in size and symmetry.   No abnormal extra-axial fluid collections or masses are identified.  No significant  calvarial abnormality.  IMPRESSION: 1.  Negative CT head.  CT CERVICAL SPINE  Findings: Mild reversal of the normal cervical lordosis. There is a central protrusion C2-3.  Solid appearing fusion across the C3-4 interspace.  Previous instrumented ACDF C4-5, hardware intact without surrounding lucency.  There is persistent lucency across the interspace however.  There is solid appearing interbody fusion C5-6 and C6-7, as well as fusion across the posterior spinous processes of C5-6.  Negative for fracture.  No prevertebral soft tissue swelling.  Emphysematous changes in the visualized lung apices.  Calcified right carotid bifurcation plaque.  IMPRESSION:  1.  Negative for fracture or other acute bony abnormality. 2.  Postoperative changes C3-C7 as above. 3. Loss of the normal cervical spine lordosis, which may be secondary to positioning, spasm, or soft tissue injury. 4.  Right carotid bifurcation plaque.  Original Report Authenticated By: Osa Craver, M.D.    EKG:   Assessment/Plan Active Problems: 1. Fall ? Syncope: will admit to telemetry -cardiac enzymes - 2 D echo EKG shows NSR. Fall precautions Iv fluids  2. Right femur neck fracture: - ain control - ortho consult for surgery  3. COPD: No exacerbation Continue with advair and nebs as needed.  4. Chest tenderness; ? Rib fracture.  Pain control.  5. DVT prophylaxis - lovenox.   Code Status: full code Family Communication: discussed with family at bedside. Disposition Plan: pending PT/OT evaluation after the surgery.  Kathlen Mody, MD  [facility] Pager [phone]  If 7PM-7AM, please contact night-coverage [email or web address] Password [facility] 08/14/2011, 5:31 PM

## 2011-08-14 NOTE — Progress Notes (Signed)
Pt admitted to room 4714. Pt placed on tele, VS wdl, pt oriented to room.  Will carry out MD orders and continue to monitor.

## 2011-08-14 NOTE — Progress Notes (Signed)
08/14/11 1452  Discharge Planning  Type of Residence Private residence  Living Arrangements Alone  Home Care Services No  Support Systems Children  Do you have any problems obtaining your medications? No  Family/patient expects to be discharged to: Unsure  Expected Discharge Date 07/20/11  Case Management Consult Needed Yes (Comment)  Social Work Consult Needed Yes (Comment)

## 2011-08-14 NOTE — Progress Notes (Signed)
08/14/11 1453  OTHER  CSW Follow Up Status Follow-up required     Pt found down 4 hours after falling. Unit based LCSW to be consulted for disposition needs and evaluation for home safety.  Dionne Milo MSW [facility]. Weekend/Social Worker [phone]

## 2011-08-14 NOTE — ED Notes (Addendum)
Per EMS, pt fell earlier today and laid on the floor for about 4 hours. Pt has chronic unsteady gait d/t bilateral dvt's.  Fell hitting the coffee table with ? LOC and landed on right side. C/o pain to right shoulder, hip, knee, and groin. Fell on carpet. Does have normal shortening to right leg d/t fusion in right knee. VSS 130/80, 100 HR, 18-20 RR, given 200 mcg Fentanyl by EMS.

## 2011-08-14 NOTE — ED Notes (Signed)
MD at bedside. 

## 2011-08-15 DIAGNOSIS — S72009A Fracture of unspecified part of neck of unspecified femur, initial encounter for closed fracture: Principal | ICD-10-CM

## 2011-08-15 DIAGNOSIS — J449 Chronic obstructive pulmonary disease, unspecified: Secondary | ICD-10-CM

## 2011-08-15 DIAGNOSIS — W19XXXA Unspecified fall, initial encounter: Secondary | ICD-10-CM

## 2011-08-15 DIAGNOSIS — R55 Syncope and collapse: Secondary | ICD-10-CM

## 2011-08-15 LAB — COMPREHENSIVE METABOLIC PANEL
AST: 18 U/L (ref 0–37)
Albumin: 3.2 g/dL — ABNORMAL LOW (ref 3.5–5.2)
Alkaline Phosphatase: 82 U/L (ref 39–117)
BUN: 10 mg/dL (ref 6–23)
Chloride: 100 mEq/L (ref 96–112)
Creatinine, Ser: 0.85 mg/dL (ref 0.50–1.35)
Potassium: 4.1 mEq/L (ref 3.5–5.1)
Total Protein: 6.4 g/dL (ref 6.0–8.3)

## 2011-08-15 LAB — CBC
HCT: 34.9 % — ABNORMAL LOW (ref 39.0–52.0)
MCH: 33.1 pg (ref 26.0–34.0)
MCV: 96.4 fL (ref 78.0–100.0)
RBC: 3.62 MIL/uL — ABNORMAL LOW (ref 4.22–5.81)
WBC: 5.4 10*3/uL (ref 4.0–10.5)

## 2011-08-15 MED ORDER — OXYCODONE-ACETAMINOPHEN 5-325 MG PO TABS
2.0000 | ORAL_TABLET | ORAL | Status: DC | PRN
  Administered 2011-08-15 – 2011-08-17 (×4): 2 via ORAL
  Filled 2011-08-15 (×4): qty 2

## 2011-08-15 MED ORDER — ENSURE COMPLETE PO LIQD
237.0000 mL | Freq: Two times a day (BID) | ORAL | Status: DC
  Administered 2011-08-15 – 2011-08-19 (×7): 237 mL via ORAL

## 2011-08-15 MED ORDER — HYDROMORPHONE HCL PF 1 MG/ML IJ SOLN
1.0000 mg | INTRAMUSCULAR | Status: DC | PRN
  Administered 2011-08-15 – 2011-08-16 (×4): 1 mg via INTRAVENOUS
  Filled 2011-08-15 (×4): qty 1

## 2011-08-15 NOTE — Progress Notes (Signed)
Patient ID: [name], male   DOB: 1934-02-22, 77 y.o.   MRN: [id number] I spoke to [name] in detail about his situation as well as showed him all of his x-rays and CT scan.  This is certainly a difficult situation.  My recommendation would be a right hip hemiarthroplasty or total hip replacement from a direct anterior approach.  Although he has no significant disease on the hip socket side, he is very thin and it may be difficult reducing a large hemiarthroplasty head.  I would prefer to perform a total hip replaceent.  The CT scan shows significant displacement of hi femoral neck fracture, which I believe negates hip pinning.  I explained this to him in detail as well as the risks and benefits of surgery.  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  The knee should not be much of an issue with a direct anterior approach.  We will obtain consent and set him up for surgery tomorrow afternoon 08/16/11.

## 2011-08-15 NOTE — Progress Notes (Signed)
[facility] PROGRESS NOTE  [name] WUJ:[id number] DOB: 13-Jul-1934 DOA: 08/14/2011 PCP: Thora Lance, MD  Brief narrative: Admitted for fall, found to have right femur fracture.  Consultants:  Orthopedic surgery  Procedures:  Total hip replacement in am  Antibiotics:  none  HPI/Subjective: Right hip pain persists.  Objective: Filed Vitals:   08/15/11 1100 08/15/11 1119 08/15/11 1300 08/15/11 2027  BP:  142/60 102/56   Pulse:  57 64   Temp:  98.1 F (36.7 C) 98 F (36.7 C)   TempSrc:  Oral    Resp:  18 18   Height:      Weight:      SpO2: 97% 97% 97% 97%    Intake/Output Summary (Last 24 hours) at 08/15/11 2104 Last data filed at 08/15/11 1748  Gross per 24 hour  Intake    560 ml  Output   1300 ml  Net   -740 ml    Exam:   General: alert afebrile mild distress  Cardiovascular: s1s2 heard  Respiratory: DECREASED air entry at bases  Abdomen: Soft NT ND BS+ Data Reviewed: Basic Metabolic Panel:  Lab 08/15/11 9562 08/14/11 1330  NA 138 136  K 4.1 4.0  CL 100 96  CO2 28 28  GLUCOSE 113* 127*  BUN 10 11  CREATININE 0.85 0.89  CALCIUM 9.2 9.6  MG -- --  PHOS -- --   Liver Function Tests:  Lab 08/15/11 0541  AST 18  ALT 9  ALKPHOS 82  BILITOT 0.7  PROT 6.4  ALBUMIN 3.2*   No results found for this basename: LIPASE:5,AMYLASE:5 in the last 168 hours No results found for this basename: AMMONIA:5 in the last 168 hours CBC:  Lab 08/15/11 0541 08/14/11 1330  WBC 5.4 9.4  NEUTROABS -- 7.9*  HGB 12.0* 13.0  HCT 34.9* 38.0*  MCV 96.4 96.2  PLT 123* 138*   Cardiac Enzymes:  Lab 08/14/11 1330  CKTOTAL 59  CKMB 2.5  CKMBINDEX --  TROPONINI <0.30   BNP: No components found with this basename: POCBNP:5 CBG: No results found for this basename: GLUCAP:5 in the last 168 hours  Recent Results (from the past 240 hour(s))  SURGICAL PCR SCREEN     Status: Abnormal   Collection Time   08/15/11  7:46 AM      Component Value  Range Status Comment   MRSA, PCR NEGATIVE  NEGATIVE Final    Staphylococcus aureus POSITIVE (*) NEGATIVE Final      Studies: Dg Chest 1 View  08/14/2011  *RADIOLOGY REPORT*  Clinical Data: Fall.  Right hip pain.  CHEST - 1 VIEW  Comparison: 12/01/2009  Findings: Atherosclerotic aorta noted with chronic scarring along the left lateral hemidiaphragm.  Prominent emphysema noted.  A right posterolateral 2nd rib fracture may be acute - correlate with tenderness in this vicinity.  The right anterolateral ninth rib fracture is more likely old given the appearance of callus.  No pneumothorax observed.  IMPRESSION:  1.  Discontinuous right second rib, possibly acute fracture - correlate with tenderness in this vicinity. 2.  Emphysema. 3.  Chronic scarring along the left lateral costophrenic angle.  Original Report Authenticated By: Dellia Cloud, M.D.   Dg Hip Complete Right  08/14/2011  *RADIOLOGY REPORT*  Clinical Data: Fall.  Right hip pain.  RIGHT HIP - COMPLETE 2+ VIEW  Comparison: 04/26/2011  Findings: Acute right femoral neck fracture noted, with a small fragment along the medial side of the fracture, and equivocal varus  angulation.  Spurring from the anterior inferior iliac spines noted.  A left common iliac artery stent is present. Old, healed fracture of the left inferior pubic ramus observed.  IMPRESSION:  1.  Acute right femoral neck fracture.  Original Report Authenticated By: Dellia Cloud, M.D.   Dg Femur Right  08/14/2011  *RADIOLOGY REPORT*  Clinical Data: Fall onto right side.  Right hip pain.  RIGHT FEMUR - 2 VIEW  Comparison: None.  Findings: Right femoral neck fracture noted. No other fracture is observed.  Bridging bony fusion noted between the femur and tibia, and femur and patella.  Atherosclerosis noted.  IMPRESSION:  1.  Acute right femoral neck fracture. 2.  Prior knee arthrodesis.  Original Report Authenticated By: Dellia Cloud, M.D.   Ct Head Wo  Contrast  08/14/2011  *RADIOLOGY REPORT*  Clinical Data:  FALL EXTREMITY PAIN,fall.  CT HEAD WITHOUT CONTRAST CT CERVICAL SPINE WITHOUT CONTRAST  Technique:  Multidetector CT imaging of the head and cervical spine was performed following the standard protocol without IV contrast. Multiplanar CT image reconstructions of the cervical spine were also generated.  Comparison: None  CT HEAD  Findings: Atherosclerotic and physiologic intracranial calcifications.  Mild atrophy. There is no evidence of acute intracranial hemorrhage, brain edema, mass lesion, acute infarction,   mass effect, or midline shift. Acute infarct may be inapparent on noncontrast CT.  No other intra-axial abnormalities are seen, and the ventricles and sulci are within normal limits in size and symmetry.   No abnormal extra-axial fluid collections or masses are identified.  No significant calvarial abnormality.  IMPRESSION: 1.  Negative CT head.  CT CERVICAL SPINE  Findings: Mild reversal of the normal cervical lordosis. There is a central protrusion C2-3.  Solid appearing fusion across the C3-4 interspace.  Previous instrumented ACDF C4-5, hardware intact without surrounding lucency.  There is persistent lucency across the interspace however.  There is solid appearing interbody fusion C5-6 and C6-7, as well as fusion across the posterior spinous processes of C5-6.  Negative for fracture.  No prevertebral soft tissue swelling.  Emphysematous changes in the visualized lung apices.  Calcified right carotid bifurcation plaque.  IMPRESSION:  1.  Negative for fracture or other acute bony abnormality. 2.  Postoperative changes C3-C7 as above. 3. Loss of the normal cervical spine lordosis, which may be secondary to positioning, spasm, or soft tissue injury. 4.  Right carotid bifurcation plaque.  Original Report Authenticated By: Osa Craver, M.D.   Ct Cervical Spine Wo Contrast  08/14/2011  *RADIOLOGY REPORT*  Clinical Data:  FALL EXTREMITY  PAIN,fall.  CT HEAD WITHOUT CONTRAST CT CERVICAL SPINE WITHOUT CONTRAST  Technique:  Multidetector CT imaging of the head and cervical spine was performed following the standard protocol without IV contrast. Multiplanar CT image reconstructions of the cervical spine were also generated.  Comparison: None  CT HEAD  Findings: Atherosclerotic and physiologic intracranial calcifications.  Mild atrophy. There is no evidence of acute intracranial hemorrhage, brain edema, mass lesion, acute infarction,   mass effect, or midline shift. Acute infarct may be inapparent on noncontrast CT.  No other intra-axial abnormalities are seen, and the ventricles and sulci are within normal limits in size and symmetry.   No abnormal extra-axial fluid collections or masses are identified.  No significant calvarial abnormality.  IMPRESSION: 1.  Negative CT head.  CT CERVICAL SPINE  Findings: Mild reversal of the normal cervical lordosis. There is a central protrusion C2-3.  Solid appearing fusion across  the C3-4 interspace.  Previous instrumented ACDF C4-5, hardware intact without surrounding lucency.  There is persistent lucency across the interspace however.  There is solid appearing interbody fusion C5-6 and C6-7, as well as fusion across the posterior spinous processes of C5-6.  Negative for fracture.  No prevertebral soft tissue swelling.  Emphysematous changes in the visualized lung apices.  Calcified right carotid bifurcation plaque.  IMPRESSION:  1.  Negative for fracture or other acute bony abnormality. 2.  Postoperative changes C3-C7 as above. 3. Loss of the normal cervical spine lordosis, which may be secondary to positioning, spasm, or soft tissue injury. 4.  Right carotid bifurcation plaque.  Original Report Authenticated By: Osa Craver, M.D.   Ct Hip Right Wo Contrast  08/14/2011  *RADIOLOGY REPORT*  Clinical Data: Further evaluation of normal hip fracture.  CT OF THE RIGHT HIP WITHOUT CONTRAST  Technique:   Multidetector CT imaging was performed according to the standard protocol. Multiplanar CT image reconstructions were also generated.  Comparison: Plain films 08/13/2008  Findings: Again seen is the fracture is within the right femoral neck.  There is mild angulation, apex anteriorly.  Mild impaction along the posterior aspect of the fracture.  No subluxation or dislocation.  IMPRESSION: Right femoral neck fracture with apex anterior angulation and posterior impaction.  Original Report Authenticated By: Cyndie Chime, M.D.    Scheduled Meds:   . sodium chloride   Intravenous STAT  . enoxaparin  40 mg Subcutaneous Q24H  . feeding supplement  237 mL Oral BID BM  . Fluticasone-Salmeterol  1 puff Inhalation Q12H  . sodium chloride  3 mL Intravenous Q12H   Continuous Infusions:   . sodium chloride 50 mL/hr at 08/15/11 0052     Assessment/Plan: 1.  1. Fall ? Syncope: will admit to telemetry  -cardiac enzymes  Negative. - 2 D echo pending. EKG shows NSR.  Fall precautions  Iv fluids  2. Right femur neck fracture:  - ain control  - ortho consult for surgery possibly Total hip replacement in am. 3. COPD:  No exacerbation  Continue with advair and nebs as needed.  4. Chest tenderness; ? Rib fracture.  Pain control.  5. DVT prophylaxis  - lovenox.      Kathlen Mody, MD  [facility] Pager [phone]  If 7PM-7AM, please contact night-coverage [email or web address] Password [email or web address] 08/15/2011, 9:04 PM   LOS: 1 day

## 2011-08-15 NOTE — Progress Notes (Signed)
INITIAL ADULT NUTRITION ASSESSMENT Date: 08/15/2011   Time: 2:29 PM Reason for Assessment: Nutrition Risk Assessment   INTERVENTION: 1. Ensure Complete po BID, each supplement provides 350 kcal and 13 grams of protein. 2. RD will continue to follow  ASSESSMENT: Male 77 y.o.  Dx: Fall  Hx:  Past Medical History  Diagnosis Date  . Hyperlipidemia   . Chronic back pain   . History of colon polyps   . Spinal stenosis   . Cervical spondylosis   . Cancer 2004    prostate  . Peripheral neuropathy   . Anxiety   . COPD (chronic obstructive pulmonary disease)   . BPH (benign prostatic hypertrophy)   . ED (erectile dysfunction)   . Pernicious anemia   . Peripheral vascular disease   . Rheumatoid arthritis   . Osteoporosis     Related Meds:     . sodium chloride   Intravenous STAT  . enoxaparin  40 mg Subcutaneous Q24H  . Fluticasone-Salmeterol  1 puff Inhalation Q12H  .  HYDROmorphone (DILAUDID) injection  1 mg Intravenous STAT  . sodium chloride  3 mL Intravenous Q12H     Ht: 5\' 8"  (172.7 cm)  Wt: 124 lb 5.4 oz (56.4 kg) (bed weight, hip fracture)  Ideal Wt: 70 kg  % Ideal Wt: 81%  Usual Wt: 160-165 lbs per pt report  % Usual Wt: 78%  Body mass index is 18.91 kg/(m^2). Pt is underweight   Food/Nutrition Related Hx: pt reports problems chewing and weight loss   Labs:  CMP     Component Value Date/Time   NA 138 08/15/2011 0541   K 4.1 08/15/2011 0541   CL 100 08/15/2011 0541   CO2 28 08/15/2011 0541   GLUCOSE 113* 08/15/2011 0541   BUN 10 08/15/2011 0541   CREATININE 0.85 08/15/2011 0541   CALCIUM 9.2 08/15/2011 0541   PROT 6.4 08/15/2011 0541   ALBUMIN 3.2* 08/15/2011 0541   AST 18 08/15/2011 0541   ALT 9 08/15/2011 0541   ALKPHOS 82 08/15/2011 0541   BILITOT 0.7 08/15/2011 0541   GFRNONAA 83* 08/15/2011 0541   GFRAA >90 08/15/2011 0541     Intake/Output Summary (Last 24 hours) at 08/15/11 1431 Last data filed at 08/15/11 0900  Gross per 24 hour  Intake    120 ml  Output     750 ml  Net   -630 ml     Diet Order: Cardiac  Supplements/Tube Feeding: none  IVF:    sodium chloride Last Rate: 50 mL/hr at 08/15/11 0052    Estimated Nutritional Needs:   Kcal:  Protein:  Fluid:   Pt reports that he has a problematic tooth that limits his chewing. Also, has had about 36 lbs (23%) over the last year. Weight loss started after the pt's wife died, does not prepare his own meals, mostly eats "junk food." Pt was not able to quantify intake. Pt likely with some level of malnutrition given his weight loss.  Pt is willing to drink Ensure to prevent further weight loss. Preferes chocolate.   NUTRITION DIAGNOSIS: -Inadequate oral intake (NI-2.1).  Status: Ongoing  RELATED TO: loss of wife, does not cook  AS EVIDENCE BY: weight loss, 23% in about 1 year   MONITORING/EVALUATION(Goals): Goal: PO intake will be >/= 75% all meals and supplements.  Monitor: PO intake, weight, labs, sx  EDUCATION NEEDS: -No education needs identified at this time    DOCUMENTATION CODES Per approved criteria  -Underweight  Tavaris Eudy Kowalski RD, LDN Pager #[phone] After Hours pager #[phone]  

## 2011-08-15 NOTE — Progress Notes (Signed)
OT Cancellation Note  Evaluation cancelled today due to medical issues with patient which prohibited therapy. Will complete s/p surgical hip repair this P.M.  Cassandria Anger, OTR/L Pager [phone] 08/15/2011, 9:51 AM

## 2011-08-15 NOTE — Progress Notes (Signed)
Patient ID: [name], male   DOB: 29-Jan-1935, 77 y.o.   MRN: [id number]     Subjective:  Patient reports pain as mild to moderate. His whole right side hurts from the fall.  Objective:   VITALS:   Filed Vitals:   08/14/11 1650 08/14/11 2014 08/15/11 0208 08/15/11 0427  BP: 174/68 142/72 125/58 128/64  Pulse: 83 75 70 72  Temp: 99.5 F (37.5 C) 98.7 F (37.1 C) 98.3 F (36.8 C) 98.1 F (36.7 C)  TempSrc: Oral Oral Oral Oral  Resp: 18 18 18 18   Height: 5\' 8"  (1.727 m)     Weight: 56.2 kg (123 lb 14.4 oz)   56.4 kg (124 lb 5.4 oz)  SpO2: 98% 98% 97% 98%    ABD soft Dorsiflexion/Plantar flexion intact Sensation at baseline.  LABS  Results for orders placed during the hospital encounter of 08/14/11 (from the past 24 hour(s))  CBC WITH DIFFERENTIAL     Status: Abnormal   Collection Time   08/14/11  1:30 PM      Component Value Range   WBC 9.4  4.0 - 10.5 K/uL   RBC 3.95 (*) 4.22 - 5.81 MIL/uL   Hemoglobin 13.0  13.0 - 17.0 g/dL   HCT 46.9 (*) 62.9 - 52.8 %   MCV 96.2  78.0 - 100.0 fL   MCH 32.9  26.0 - 34.0 pg   MCHC 34.2  30.0 - 36.0 g/dL   RDW 41.3  24.4 - 01.0 %   Platelets 138 (*) 150 - 400 K/uL   Neutrophils Relative 84 (*) 43 - 77 %   Neutro Abs 7.9 (*) 1.7 - 7.7 K/uL   Lymphocytes Relative 8 (*) 12 - 46 %   Lymphs Abs 0.8  0.7 - 4.0 K/uL   Monocytes Relative 8  3 - 12 %   Monocytes Absolute 0.7  0.1 - 1.0 K/uL   Eosinophils Relative 0  0 - 5 %   Eosinophils Absolute 0.0  0.0 - 0.7 K/uL   Basophils Relative 0  0 - 1 %   Basophils Absolute 0.0  0.0 - 0.1 K/uL  BASIC METABOLIC PANEL     Status: Abnormal   Collection Time   08/14/11  1:30 PM      Component Value Range   Sodium 136  135 - 145 mEq/L   Potassium 4.0  3.5 - 5.1 mEq/L   Chloride 96  96 - 112 mEq/L   CO2 28  19 - 32 mEq/L   Glucose, Bld 127 (*) 70 - 99 mg/dL   BUN 11  6 - 23 mg/dL   Creatinine, Ser 2.72  0.50 - 1.35 mg/dL   Calcium 9.6  8.4 - 53.6 mg/dL   GFR calc non Af Amer 82 (*) >90  mL/min   GFR calc Af Amer >90  >90 mL/min  CARDIAC PANEL(CRET KIN+CKTOT+MB+TROPI)     Status: Normal   Collection Time   08/14/11  1:30 PM      Component Value Range   Total CK 59  7 - 232 U/L   CK, MB 2.5  0.3 - 4.0 ng/mL   Troponin I <0.30  <0.30 ng/mL   Relative Index RELATIVE INDEX IS INVALID  0.0 - 2.5  PROTIME-INR     Status: Normal   Collection Time   08/14/11  1:30 PM      Component Value Range   Prothrombin Time 13.3  11.6 - 15.2 seconds   INR 0.99  0.00 - 1.49  URINALYSIS, ROUTINE W REFLEX MICROSCOPIC     Status: Normal   Collection Time   08/14/11  9:46 PM      Component Value Range   Color, Urine YELLOW  YELLOW   APPearance CLEAR  CLEAR   Specific Gravity, Urine 1.013  1.005 - 1.030   pH 7.5  5.0 - 8.0   Glucose, UA NEGATIVE  NEGATIVE mg/dL   Hgb urine dipstick NEGATIVE  NEGATIVE   Bilirubin Urine NEGATIVE  NEGATIVE   Ketones, ur NEGATIVE  NEGATIVE mg/dL   Protein, ur NEGATIVE  NEGATIVE mg/dL   Urobilinogen, UA 1.0  0.0 - 1.0 mg/dL   Nitrite NEGATIVE  NEGATIVE   Leukocytes, UA NEGATIVE  NEGATIVE  COMPREHENSIVE METABOLIC PANEL     Status: Abnormal   Collection Time   08/15/11  5:41 AM      Component Value Range   Sodium 138  135 - 145 mEq/L   Potassium 4.1  3.5 - 5.1 mEq/L   Chloride 100  96 - 112 mEq/L   CO2 28  19 - 32 mEq/L   Glucose, Bld 113 (*) 70 - 99 mg/dL   BUN 10  6 - 23 mg/dL   Creatinine, Ser 1.61  0.50 - 1.35 mg/dL   Calcium 9.2  8.4 - 09.6 mg/dL   Total Protein 6.4  6.0 - 8.3 g/dL   Albumin 3.2 (*) 3.5 - 5.2 g/dL   AST 18  0 - 37 U/L   ALT 9  0 - 53 U/L   Alkaline Phosphatase 82  39 - 117 U/L   Total Bilirubin 0.7  0.3 - 1.2 mg/dL   GFR calc non Af Amer 83 (*) >90 mL/min   GFR calc Af Amer >90  >90 mL/min  CBC     Status: Abnormal   Collection Time   08/15/11  5:41 AM      Component Value Range   WBC 5.4  4.0 - 10.5 K/uL   RBC 3.62 (*) 4.22 - 5.81 MIL/uL   Hemoglobin 12.0 (*) 13.0 - 17.0 g/dL   HCT 04.5 (*) 40.9 - 81.1 %   MCV 96.4  78.0 -  100.0 fL   MCH 33.1  26.0 - 34.0 pg   MCHC 34.4  30.0 - 36.0 g/dL   RDW 91.4  78.2 - 95.6 %   Platelets 123 (*) 150 - 400 K/uL    Dg Chest 1 View  08/14/2011  *RADIOLOGY REPORT*  Clinical Data: Fall.  Right hip pain.  CHEST - 1 VIEW  Comparison: 12/01/2009  Findings: Atherosclerotic aorta noted with chronic scarring along the left lateral hemidiaphragm.  Prominent emphysema noted.  A right posterolateral 2nd rib fracture may be acute - correlate with tenderness in this vicinity.  The right anterolateral ninth rib fracture is more likely old given the appearance of callus.  No pneumothorax observed.  IMPRESSION:  1.  Discontinuous right second rib, possibly acute fracture - correlate with tenderness in this vicinity. 2.  Emphysema. 3.  Chronic scarring along the left lateral costophrenic angle.  Original Report Authenticated By: Dellia Cloud, M.D.   Dg Hip Complete Right  08/14/2011  *RADIOLOGY REPORT*  Clinical Data: Fall.  Right hip pain.  RIGHT HIP - COMPLETE 2+ VIEW  Comparison: 04/26/2011  Findings: Acute right femoral neck fracture noted, with a small fragment along the medial side of the fracture, and equivocal varus angulation.  Spurring from the anterior inferior iliac spines noted.  A  left common iliac artery stent is present. Old, healed fracture of the left inferior pubic ramus observed.  IMPRESSION:  1.  Acute right femoral neck fracture.  Original Report Authenticated By: Dellia Cloud, M.D.   Dg Femur Right  08/14/2011  *RADIOLOGY REPORT*  Clinical Data: Fall onto right side.  Right hip pain.  RIGHT FEMUR - 2 VIEW  Comparison: None.  Findings: Right femoral neck fracture noted. No other fracture is observed.  Bridging bony fusion noted between the femur and tibia, and femur and patella.  Atherosclerosis noted.  IMPRESSION:  1.  Acute right femoral neck fracture. 2.  Prior knee arthrodesis.  Original Report Authenticated By: Dellia Cloud, M.D.   Ct Head Wo  Contrast  08/14/2011  *RADIOLOGY REPORT*  Clinical Data:  FALL EXTREMITY PAIN,fall.  CT HEAD WITHOUT CONTRAST CT CERVICAL SPINE WITHOUT CONTRAST  Technique:  Multidetector CT imaging of the head and cervical spine was performed following the standard protocol without IV contrast. Multiplanar CT image reconstructions of the cervical spine were also generated.  Comparison: None  CT HEAD  Findings: Atherosclerotic and physiologic intracranial calcifications.  Mild atrophy. There is no evidence of acute intracranial hemorrhage, brain edema, mass lesion, acute infarction,   mass effect, or midline shift. Acute infarct may be inapparent on noncontrast CT.  No other intra-axial abnormalities are seen, and the ventricles and sulci are within normal limits in size and symmetry.   No abnormal extra-axial fluid collections or masses are identified.  No significant calvarial abnormality.  IMPRESSION: 1.  Negative CT head.  CT CERVICAL SPINE  Findings: Mild reversal of the normal cervical lordosis. There is a central protrusion C2-3.  Solid appearing fusion across the C3-4 interspace.  Previous instrumented ACDF C4-5, hardware intact without surrounding lucency.  There is persistent lucency across the interspace however.  There is solid appearing interbody fusion C5-6 and C6-7, as well as fusion across the posterior spinous processes of C5-6.  Negative for fracture.  No prevertebral soft tissue swelling.  Emphysematous changes in the visualized lung apices.  Calcified right carotid bifurcation plaque.  IMPRESSION:  1.  Negative for fracture or other acute bony abnormality. 2.  Postoperative changes C3-C7 as above. 3. Loss of the normal cervical spine lordosis, which may be secondary to positioning, spasm, or soft tissue injury. 4.  Right carotid bifurcation plaque.  Original Report Authenticated By: Osa Craver, M.D.   Ct Cervical Spine Wo Contrast  08/14/2011  *RADIOLOGY REPORT*  Clinical Data:  FALL EXTREMITY  PAIN,fall.  CT HEAD WITHOUT CONTRAST CT CERVICAL SPINE WITHOUT CONTRAST  Technique:  Multidetector CT imaging of the head and cervical spine was performed following the standard protocol without IV contrast. Multiplanar CT image reconstructions of the cervical spine were also generated.  Comparison: None  CT HEAD  Findings: Atherosclerotic and physiologic intracranial calcifications.  Mild atrophy. There is no evidence of acute intracranial hemorrhage, brain edema, mass lesion, acute infarction,   mass effect, or midline shift. Acute infarct may be inapparent on noncontrast CT.  No other intra-axial abnormalities are seen, and the ventricles and sulci are within normal limits in size and symmetry.   No abnormal extra-axial fluid collections or masses are identified.  No significant calvarial abnormality.  IMPRESSION: 1.  Negative CT head.  CT CERVICAL SPINE  Findings: Mild reversal of the normal cervical lordosis. There is a central protrusion C2-3.  Solid appearing fusion across the C3-4 interspace.  Previous instrumented ACDF C4-5, hardware intact without surrounding  lucency.  There is persistent lucency across the interspace however.  There is solid appearing interbody fusion C5-6 and C6-7, as well as fusion across the posterior spinous processes of C5-6.  Negative for fracture.  No prevertebral soft tissue swelling.  Emphysematous changes in the visualized lung apices.  Calcified right carotid bifurcation plaque.  IMPRESSION:  1.  Negative for fracture or other acute bony abnormality. 2.  Postoperative changes C3-C7 as above. 3. Loss of the normal cervical spine lordosis, which may be secondary to positioning, spasm, or soft tissue injury. 4.  Right carotid bifurcation plaque.  Original Report Authenticated By: Osa Craver, M.D.   Ct Hip Right Wo Contrast  08/14/2011  *RADIOLOGY REPORT*  Clinical Data: Further evaluation of normal hip fracture.  CT OF THE RIGHT HIP WITHOUT CONTRAST  Technique:   Multidetector CT imaging was performed according to the standard protocol. Multiplanar CT image reconstructions were also generated.  Comparison: Plain films 08/13/2008  Findings: Again seen is the fracture is within the right femoral neck.  There is mild angulation, apex anteriorly.  Mild impaction along the posterior aspect of the fracture.  No subluxation or dislocation.  IMPRESSION: Right femoral neck fracture with apex anterior angulation and posterior impaction.  Original Report Authenticated By: Cyndie Chime, M.D.    Assessment/Plan:     Principal Problem:  *Fall Active Problems:  Syncope  Femur fracture  Thrombocytopenia  COPD (chronic obstructive pulmonary disease)  PVD (peripheral vascular disease)  Rheumatoid arthritis  Anxiety   Advance diet Will consult Dr Magnus Ivan for surgical options.  His CT scan demonstrates substantial displacement, and I don't think that pinning would be a good option.  Would anticipate probably anterior hip hemi tomorrow, but will defer to Dr. Magnus Ivan.   Haskel Khan 08/15/2011, 8:13 AM   Teryl Lucy, MD Cell [phone] Pager [phone]

## 2011-08-15 NOTE — Progress Notes (Signed)
Utilization review completed.  

## 2011-08-15 NOTE — Progress Notes (Signed)
PT Cancellation Note  Treatment cancelled today due to medical issues with patient which prohibited therapy. Pt scheduled for surgical hip repair this afternoon. Will eval pt on POD #1  Cornelia Walraven 08/15/2011, 9:01 AM Theron Arista L. Audrianna Driskill DPT [phone].

## 2011-08-16 ENCOUNTER — Encounter (HOSPITAL_COMMUNITY): Payer: Self-pay | Admitting: Anesthesiology

## 2011-08-16 ENCOUNTER — Inpatient Hospital Stay (HOSPITAL_COMMUNITY): Payer: Medicare Other | Admitting: Anesthesiology

## 2011-08-16 ENCOUNTER — Inpatient Hospital Stay (HOSPITAL_COMMUNITY): Payer: Medicare Other

## 2011-08-16 ENCOUNTER — Encounter (HOSPITAL_COMMUNITY)
Admission: EM | Disposition: A | Discharge status: Skilled Nursing Facility | Payer: Self-pay | Source: Home / Self Care | Attending: Internal Medicine

## 2011-08-16 DIAGNOSIS — D696 Thrombocytopenia, unspecified: Secondary | ICD-10-CM

## 2011-08-16 HISTORY — PX: TOTAL HIP ARTHROPLASTY: SHX124

## 2011-08-16 SURGERY — ARTHROPLASTY, HIP, TOTAL, ANTERIOR APPROACH
Anesthesia: General | Site: Hip | Laterality: Right | Wound class: Clean

## 2011-08-16 MED ORDER — CEFAZOLIN SODIUM 1-5 GM-% IV SOLN
1.0000 g | Freq: Four times a day (QID) | INTRAVENOUS | Status: AC
  Administered 2011-08-16 – 2011-08-17 (×2): 1 g via INTRAVENOUS
  Filled 2011-08-16 (×2): qty 50

## 2011-08-16 MED ORDER — PROPOFOL 10 MG/ML IV EMUL
INTRAVENOUS | Status: DC | PRN
  Administered 2011-08-16: 140 mg via INTRAVENOUS

## 2011-08-16 MED ORDER — CEFAZOLIN SODIUM 1-5 GM-% IV SOLN
INTRAVENOUS | Status: DC | PRN
  Administered 2011-08-16: 1 g via INTRAVENOUS

## 2011-08-16 MED ORDER — ONDANSETRON HCL 4 MG/2ML IJ SOLN
INTRAMUSCULAR | Status: DC | PRN
  Administered 2011-08-16: 4 mg via INTRAVENOUS

## 2011-08-16 MED ORDER — METHOCARBAMOL 500 MG PO TABS
500.0000 mg | ORAL_TABLET | Freq: Four times a day (QID) | ORAL | Status: DC | PRN
  Filled 2011-08-16: qty 1

## 2011-08-16 MED ORDER — COUMADIN BOOK
Freq: Once | Status: AC
  Administered 2011-08-16: 21:00:00
  Filled 2011-08-16: qty 1

## 2011-08-16 MED ORDER — SODIUM CHLORIDE 0.9 % IV SOLN
INTRAVENOUS | Status: DC
  Administered 2011-08-16: 19:00:00 via INTRAVENOUS
  Administered 2011-08-17: 1000 mL via INTRAVENOUS

## 2011-08-16 MED ORDER — LIDOCAINE HCL (CARDIAC) 20 MG/ML IV SOLN
INTRAVENOUS | Status: DC | PRN
  Administered 2011-08-16: 100 mg via INTRAVENOUS

## 2011-08-16 MED ORDER — ALUM & MAG HYDROXIDE-SIMETH 200-200-20 MG/5ML PO SUSP: 30.0000 mL | ORAL | Status: DC | PRN

## 2011-08-16 MED ORDER — METOCLOPRAMIDE HCL 5 MG PO TABS
5.0000 mg | ORAL_TABLET | Freq: Three times a day (TID) | ORAL | Status: DC | PRN
  Filled 2011-08-16: qty 2

## 2011-08-16 MED ORDER — ACETAMINOPHEN 325 MG PO TABS: 650.0000 mg | ORAL_TABLET | Freq: Four times a day (QID) | ORAL | Status: DC | PRN

## 2011-08-16 MED ORDER — ONDANSETRON HCL 4 MG/2ML IJ SOLN: 4.0000 mg | Freq: Four times a day (QID) | INTRAMUSCULAR | Status: DC | PRN

## 2011-08-16 MED ORDER — CEFAZOLIN SODIUM 1-5 GM-% IV SOLN
INTRAVENOUS | Status: AC
  Filled 2011-08-16: qty 50

## 2011-08-16 MED ORDER — FENTANYL CITRATE 0.05 MG/ML IJ SOLN
INTRAMUSCULAR | Status: DC | PRN
  Administered 2011-08-16 (×4): 50 ug via INTRAVENOUS
  Administered 2011-08-16: 100 ug via INTRAVENOUS
  Administered 2011-08-16: 50 ug via INTRAVENOUS

## 2011-08-16 MED ORDER — DOCUSATE SODIUM 100 MG PO CAPS
100.0000 mg | ORAL_CAPSULE | Freq: Two times a day (BID) | ORAL | Status: DC
  Administered 2011-08-17 – 2011-08-18 (×4): 100 mg via ORAL
  Filled 2011-08-16 (×7): qty 1

## 2011-08-16 MED ORDER — ESMOLOL HCL 10 MG/ML IV SOLN
INTRAVENOUS | Status: DC | PRN
  Administered 2011-08-16: 20 mg via INTRAVENOUS
  Administered 2011-08-16: 30 mg via INTRAVENOUS

## 2011-08-16 MED ORDER — HYDROMORPHONE HCL PF 1 MG/ML IJ SOLN
INTRAMUSCULAR | Status: AC
  Administered 2011-08-16: 0.5 mg via INTRAVENOUS
  Filled 2011-08-16: qty 1

## 2011-08-16 MED ORDER — METHOCARBAMOL 100 MG/ML IJ SOLN
500.0000 mg | Freq: Four times a day (QID) | INTRAVENOUS | Status: DC | PRN
  Filled 2011-08-16: qty 5

## 2011-08-16 MED ORDER — ONDANSETRON HCL 4 MG PO TABS: 4.0000 mg | ORAL_TABLET | Freq: Four times a day (QID) | ORAL | Status: DC | PRN

## 2011-08-16 MED ORDER — WARFARIN VIDEO
Freq: Once | Status: AC
  Administered 2011-08-17: 08:00:00

## 2011-08-16 MED ORDER — GLYCOPYRROLATE 0.2 MG/ML IJ SOLN
INTRAMUSCULAR | Status: DC | PRN
  Administered 2011-08-16: .5 mg via INTRAVENOUS

## 2011-08-16 MED ORDER — DIPHENHYDRAMINE HCL 12.5 MG/5ML PO ELIX
12.5000 mg | ORAL_SOLUTION | ORAL | Status: DC | PRN
  Filled 2011-08-16: qty 10

## 2011-08-16 MED ORDER — PHENOL 1.4 % MT LIQD
1.0000 | OROMUCOSAL | Status: DC | PRN
  Filled 2011-08-16: qty 177

## 2011-08-16 MED ORDER — ACETAMINOPHEN 650 MG RE SUPP: 650.0000 mg | Freq: Four times a day (QID) | RECTAL | Status: DC | PRN

## 2011-08-16 MED ORDER — PROMETHAZINE HCL 25 MG/ML IJ SOLN
6.2500 mg | INTRAMUSCULAR | Status: DC | PRN
Start: 2011-08-16 — End: 2011-08-16

## 2011-08-16 MED ORDER — HYDROMORPHONE HCL PF 1 MG/ML IJ SOLN
0.2500 mg | INTRAMUSCULAR | Status: DC | PRN
  Administered 2011-08-16 (×4): 0.5 mg via INTRAVENOUS

## 2011-08-16 MED ORDER — NEOSTIGMINE METHYLSULFATE 1 MG/ML IJ SOLN
INTRAMUSCULAR | Status: DC | PRN
  Administered 2011-08-16: 4 mg via INTRAVENOUS

## 2011-08-16 MED ORDER — ROCURONIUM BROMIDE 100 MG/10ML IV SOLN
INTRAVENOUS | Status: DC | PRN
  Administered 2011-08-16: 40 mg via INTRAVENOUS

## 2011-08-16 MED ORDER — ZOLPIDEM TARTRATE 5 MG PO TABS: 5.0000 mg | ORAL_TABLET | Freq: Every evening | ORAL | Status: DC | PRN

## 2011-08-16 MED ORDER — WARFARIN SODIUM 5 MG PO TABS
5.0000 mg | ORAL_TABLET | Freq: Once | ORAL | Status: AC
  Administered 2011-08-16: 5 mg via ORAL
  Filled 2011-08-16: qty 1

## 2011-08-16 MED ORDER — 0.9 % SODIUM CHLORIDE (POUR BTL) OPTIME
TOPICAL | Status: DC | PRN
  Administered 2011-08-16: 1000 mL

## 2011-08-16 MED ORDER — MENTHOL 3 MG MT LOZG
1.0000 | LOZENGE | OROMUCOSAL | Status: DC | PRN
  Filled 2011-08-16: qty 9

## 2011-08-16 MED ORDER — HYDROMORPHONE HCL PF 1 MG/ML IJ SOLN
0.5000 mg | INTRAMUSCULAR | Status: DC | PRN
  Administered 2011-08-16 (×2): 0.5 mg via INTRAVENOUS

## 2011-08-16 MED ORDER — MEPERIDINE HCL 25 MG/ML IJ SOLN: 6.2500 mg | INTRAMUSCULAR | Status: DC | PRN

## 2011-08-16 MED ORDER — LACTATED RINGERS IV SOLN
INTRAVENOUS | Status: DC | PRN
  Administered 2011-08-16 (×2): via INTRAVENOUS

## 2011-08-16 MED ORDER — WARFARIN - PHARMACIST DOSING INPATIENT: Freq: Every day | Status: DC

## 2011-08-16 MED ORDER — METHOCARBAMOL 100 MG/ML IJ SOLN
500.0000 mg | INTRAVENOUS | Status: AC
  Administered 2011-08-16: 500 mg via INTRAVENOUS
  Filled 2011-08-16: qty 5

## 2011-08-16 MED ORDER — HYDROMORPHONE HCL PF 1 MG/ML IJ SOLN
1.0000 mg | INTRAMUSCULAR | Status: DC | PRN
  Administered 2011-08-16 – 2011-08-19 (×9): 1 mg via INTRAVENOUS
  Filled 2011-08-16 (×10): qty 1

## 2011-08-16 MED ORDER — METOCLOPRAMIDE HCL 5 MG/ML IJ SOLN
5.0000 mg | Freq: Three times a day (TID) | INTRAMUSCULAR | Status: DC | PRN
  Filled 2011-08-16: qty 2

## 2011-08-16 MED ORDER — OXYCODONE HCL 5 MG PO TABS
5.0000 mg | ORAL_TABLET | ORAL | Status: DC | PRN
  Administered 2011-08-17 – 2011-08-19 (×3): 10 mg via ORAL
  Filled 2011-08-16 (×3): qty 2

## 2011-08-16 SURGICAL SUPPLY — 58 items
BLADE SAW SAG 73X25 THK (BLADE) ×1
BLADE SAW SGTL 18X1.27X75 (BLADE) ×2 IMPLANT
BLADE SAW SGTL 73X25 THK (BLADE) ×1 IMPLANT
BLADE SURG ROTATE 9660 (MISCELLANEOUS) IMPLANT
BRUSH FEMORAL CANAL (MISCELLANEOUS) IMPLANT
CELLS DAT CNTRL 66122 CELL SVR (MISCELLANEOUS) ×1 IMPLANT
CLOTH BEACON ORANGE TIMEOUT ST (SAFETY) ×2 IMPLANT
COVER BACK TABLE 24X17X13 BIG (DRAPES) IMPLANT
COVER SURGICAL LIGHT HANDLE (MISCELLANEOUS) ×2 IMPLANT
DRAPE C-ARM 42X72 X-RAY (DRAPES) ×2 IMPLANT
DRAPE INCISE IOBAN 85X60 (DRAPES) ×4 IMPLANT
DRAPE PROXIMA HALF (DRAPES) ×1 IMPLANT
DRAPE STERI IOBAN 125X83 (DRAPES) ×2 IMPLANT
DRAPE U-SHAPE 47X51 STRL (DRAPES) ×6 IMPLANT
DRSG MEPILEX BORDER 4X12 (GAUZE/BANDAGES/DRESSINGS) IMPLANT
DRSG MEPILEX BORDER 4X8 (GAUZE/BANDAGES/DRESSINGS) ×2 IMPLANT
DURAPREP 26ML APPLICATOR (WOUND CARE) ×2 IMPLANT
ELECT BLADE 4.0 EZ CLEAN MEGAD (MISCELLANEOUS)
ELECT BLADE TIP CTD 4 INCH (ELECTRODE) ×2 IMPLANT
ELECT CAUTERY BLADE 6.4 (BLADE) ×2 IMPLANT
ELECT REM PT RETURN 9FT ADLT (ELECTROSURGICAL) ×2
ELECTRODE BLDE 4.0 EZ CLN MEGD (MISCELLANEOUS) IMPLANT
ELECTRODE REM PT RTRN 9FT ADLT (ELECTROSURGICAL) ×1 IMPLANT
FACESHIELD LNG OPTICON STERILE (SAFETY) ×4 IMPLANT
GAUZE XEROFORM 1X8 LF (GAUZE/BANDAGES/DRESSINGS) ×2 IMPLANT
GLOVE BIOGEL PI IND STRL 7.5 (GLOVE) ×1 IMPLANT
GLOVE BIOGEL PI IND STRL 8 (GLOVE) ×1 IMPLANT
GLOVE BIOGEL PI INDICATOR 7.5 (GLOVE) ×3
GLOVE BIOGEL PI INDICATOR 8 (GLOVE) ×1
GLOVE ECLIPSE 7.0 STRL STRAW (GLOVE) ×3 IMPLANT
GLOVE ORTHO TXT STRL SZ7.5 (GLOVE) ×2 IMPLANT
GLOVE SS N UNI LF 7.0 STRL (GLOVE) ×1 IMPLANT
GOWN PREVENTION PLUS LG XLONG (DISPOSABLE) IMPLANT
GOWN STRL NON-REIN LRG LVL3 (GOWN DISPOSABLE) ×4 IMPLANT
GOWN STRL REIN XL XLG (GOWN DISPOSABLE) ×4 IMPLANT
KIT BASIN OR (CUSTOM PROCEDURE TRAY) ×2 IMPLANT
KIT ROOM TURNOVER OR (KITS) ×2 IMPLANT
MANIFOLD NEPTUNE II (INSTRUMENTS) ×2 IMPLANT
NS IRRIG 1000ML POUR BTL (IV SOLUTION) ×2 IMPLANT
PACK TOTAL JOINT (CUSTOM PROCEDURE TRAY) ×2 IMPLANT
PAD ARMBOARD 7.5X6 YLW CONV (MISCELLANEOUS) ×4 IMPLANT
RETRACTOR WND ALEXIS 18 MED (MISCELLANEOUS) ×1 IMPLANT
RTRCTR WOUND ALEXIS 18CM MED (MISCELLANEOUS) ×2
SPONGE LAP 18X18 X RAY DECT (DISPOSABLE) ×2 IMPLANT
SPONGE LAP 4X18 X RAY DECT (DISPOSABLE) ×1 IMPLANT
STAPLER SKIN PROX WIDE 3.9 (STAPLE) ×2 IMPLANT
STAPLER VISISTAT 35W (STAPLE) ×2 IMPLANT
SUT ETHIBOND NAB CT1 #1 30IN (SUTURE) ×5 IMPLANT
SUT VIC AB 0 CT1 27 (SUTURE) ×4
SUT VIC AB 0 CT1 27XBRD ANBCTR (SUTURE) ×2 IMPLANT
SUT VIC AB 1 CT1 27 (SUTURE) ×4
SUT VIC AB 1 CT1 27XBRD ANBCTR (SUTURE) ×2 IMPLANT
SUT VIC AB 2-0 CT1 27 (SUTURE) ×6
SUT VIC AB 2-0 CT1 TAPERPNT 27 (SUTURE) ×2 IMPLANT
TOWEL OR 17X24 6PK STRL BLUE (TOWEL DISPOSABLE) ×2 IMPLANT
TOWEL OR 17X26 10 PK STRL BLUE (TOWEL DISPOSABLE) ×4 IMPLANT
TRAY FOLEY CATH 14FR (SET/KITS/TRAYS/PACK) IMPLANT
WATER STERILE IRR 1000ML POUR (IV SOLUTION) ×4 IMPLANT

## 2011-08-16 NOTE — Progress Notes (Signed)
Pt returned from operating room for surgery to Rt. Hip.  Dsg. D&I with ice pack in place.  Pt somewhat sleepy.  VSS.  Will cont. To monitor.  Amanda Pea, Charity fundraiser.

## 2011-08-16 NOTE — Progress Notes (Signed)
Pt's BP170/66 with machine & 172/70 manually.  Notified Sonia in [place] and stated she will inform others and ok to bring pt downstairs.  Pt asymptomatic  Amanda Pea, Charity fundraiser.

## 2011-08-16 NOTE — Preoperative (Signed)
Beta Blockers   Reason not to administer Beta Blockers:Not Applicable 

## 2011-08-16 NOTE — Progress Notes (Signed)
ANTICOAGULATION CONSULT NOTE - Initial Consult  Pharmacy Consult for Coumadin Indication: VTE prophylaxis  Allergies  Allergen Reactions  . Lipitor (Atorvastatin Calcium)     myalgias  . Tiotropium Bromide Monohydrate     hoarseness    Patient Measurements: Height: 5\' 8"  (172.7 cm) Weight: 124 lb 5.4 oz (56.4 kg) (bed scale: hip fracture) IBW/kg (Calculated) : 68.4  Heparin Dosing Weight:    Vital Signs: Temp: 98.1 F (36.7 C) (07/02 1704) Temp src: Oral (07/02 1704) BP: 146/64 mmHg (07/02 1704) Pulse Rate: 75  (07/02 1704)  Labs:  Basename 08/15/11 0541 08/14/11 1330  HGB 12.0* 13.0  HCT 34.9* 38.0*  PLT 123* 138*  APTT -- --  LABPROT -- 13.3  INR -- 0.99  HEPARINUNFRC -- --  CREATININE 0.85 0.89  CKTOTAL -- 59  CKMB -- 2.5  TROPONINI -- <0.30    Estimated Creatinine Clearance: 59.9 ml/min (by C-G formula based on Cr of 0.85).   Medical History: Past Medical History  Diagnosis Date  . Hyperlipidemia   . Chronic back pain   . History of colon polyps   . Spinal stenosis   . Cervical spondylosis   . Cancer 2004    prostate  . Peripheral neuropathy   . Anxiety   . COPD (chronic obstructive pulmonary disease)   . BPH (benign prostatic hypertrophy)   . ED (erectile dysfunction)   . Pernicious anemia   . Peripheral vascular disease   . Rheumatoid arthritis   . Osteoporosis     Medications:  Prescriptions prior to admission  Medication Sig Dispense Refill  . ALPRAZolam (XANAX) 1 MG tablet Take 1 mg by mouth at bedtime as needed. For anxiety/sleep.      . Cyanocobalamin (VITAMIN B-12 IJ) Inject 1 each as directed every 30 (thirty) days.      . Fluticasone-Salmeterol (ADVAIR DISKUS) 250-50 MCG/DOSE AEPB Inhale 1 puff into the lungs every 12 (twelve) hours.      . Multiple Vitamin (MULTIVITAMIN) tablet Take 1 tablet by mouth daily.      Marland Kitchen oxyCODONE-acetaminophen (PERCOCET) 5-325 MG per tablet Take 1 tablet by mouth every 4 (four) hours as needed. For  pain.      . rosuvastatin (CRESTOR) 20 MG tablet Take 20 mg by mouth daily.      . sildenafil (VIAGRA) 100 MG tablet Take 100 mg by mouth daily as needed. For E.D.        Assessment: s/p fall with R femur fx; R THA Patient admitted to telemetry to w/u reason for multiple falls such as possible syncope. Cardiac enzymes negative. Patient has mild thrombocytopenia.   Goal of Therapy:  INR 2-3 Monitor platelets by anticoagulation protocol: Yes   Plan:  Coumadin 5mg  po x 1 tonight Daily PT/INR Initiated education with book/video.   Misty Stanley Stillinger 08/16/2011,5:28 PM

## 2011-08-16 NOTE — Progress Notes (Signed)
OT Cancellation Note   Note pt scheduled for left hip replacement this am.  Will sign off for now and await new OT orders post surgery, thanks. Perrin Maltese, OTR/L Pager number [phone] 08/16/2011

## 2011-08-16 NOTE — Progress Notes (Signed)
Pt arrived from OR with surgery to Rt. Hip dsg. [name]  [name]. Denies pain at this time.  Will cont. to monitor.  [name] & [name].

## 2011-08-16 NOTE — Transfer of Care (Signed)
Immediate Anesthesia Transfer of Care Note  Patient: [name]  Procedure(s) Performed: Procedure(s) (LRB): TOTAL HIP ARTHROPLASTY ANTERIOR APPROACH (Right)  Patient Location: PACU  Anesthesia Type: General  Level of Consciousness: awake  Airway & Oxygen Therapy: Patient Spontanous Breathing and Patient connected to face mask oxygen  Post-op Assessment: Report given to PACU RN and Post -op Vital signs reviewed and stable  Post vital signs: Reviewed and stable  Complications: No apparent anesthesia complications

## 2011-08-16 NOTE — Clinical Documentation Improvement (Signed)
BMI DOCUMENTATION CLARIFICATION QUERY  THIS DOCUMENT IS NOT A PERMANENT PART OF THE MEDICAL RECORD          08/16/11  Dr. Blake Divine [facility]  In an effort to better capture your patient's severity of illness, reflect appropriate length of stay and utilization of resources, a review of the patient medical record has revealed the following indicators:  Height   5'  8"  Weight   123 lbs    14.4 ozs  BMI  18.9  Registered Dietician Consult 08/15/11   Ideal Wt: 70 kg        % Ideal Wt: 81% Usual Wt: 160-165 lbs per pt report       % Usual Wt: 78% Body mass index is 18.91 kg/(m^2).    Pt is underweight  Food/Nutrition Related Hx: pt reports problems chewing and weight loss Pt likely with some level of malnutrition given his weight loss.  Pt is willing to drink Ensure to prevent further weight loss.  INTERVENTION: 1. Ensure Complete po BID, each supplement provides 350 kcal and 13 grams of protein.                                 2. RD will continue to follow  Based on your clinical judgment, please document in the progress notes and discharge summary if a condition below provides greater specificity regarding the patient's height, weight and history:   - Underweight, BMI 18.9   - Cachexia, BMI 18.9   - Other Condition   - Unable to Clinically Determine   In responding to this query please exercise your independent judgment.    The fact that a query is asked, does not imply that any particular answer is desired or expected.   Reviewed: 08/19/11 - query not addressed - ndrgi.  Closed chl and pt.  Dc 08/19/11.  Mathis Dad RN  Thank You,  Jerral Ralph  RN BSN CCDS Certified Clinical Documentation Specialist: Cell   [phone]  Health Information Management [place]  TO RESPOND TO THE THIS QUERY, FOLLOW THE INSTRUCTIONS BELOW:  1. If needed, update documentation for the patient's encounter via the notes activity.  2. Access this query again and click edit  on the In [employer].  3. After updating, or not, click F2 to complete all highlighted (required) fields concerning your review. Select "additional documentation in the medical record" OR "no additional documentation provided".  4. Click Sign note button.  5. The deficiency will fall out of your In Basket *Please let us know if you are not able to complete this workflow by phone or e-mail (listed below).

## 2011-08-16 NOTE — Progress Notes (Signed)
PT Cancellation Note  Treatment cancelled today due to pt schedule for hip replacement surgery today.  Will sign off and await new pos-op orders.    [name] 08/16/2011, 11:14 AM Theron Arista L. Alexsandra Shontz DPT [phone]

## 2011-08-16 NOTE — Anesthesia Preprocedure Evaluation (Addendum)
Anesthesia Evaluation  Patient identified by MRN, date of birth, ID band Patient awake    Reviewed: Allergy & Precautions, H&P , NPO status , Patient's Chart, lab work & pertinent test results, reviewed documented beta blocker date and time   History of Anesthesia Complications Negative for: history of anesthetic complications  Airway Mallampati: II  Neck ROM: Full    Dental  (+) Missing and Edentulous Upper   Pulmonary COPD COPD inhaler, Current Smoker,  breath sounds clear to auscultation        Cardiovascular negative cardio ROS  Rhythm:Regular Rate:Normal     Neuro/Psych PSYCHIATRIC DISORDERS  Neuromuscular disease    GI/Hepatic Neg liver ROS,   Endo/Other    Renal/GU negative Renal ROS     Musculoskeletal  (+) Arthritis -, Rheumatoid disorders,    Abdominal   Peds  Hematology   Anesthesia Other Findings   Reproductive/Obstetrics                          Anesthesia Physical Anesthesia Plan  ASA: III  Anesthesia Plan: General   Post-op Pain Management:    Induction: Intravenous  Airway Management Planned: Oral ETT  Additional Equipment:   Intra-op Plan:   Post-operative Plan: Extubation in OR  Informed Consent: I have reviewed the patients History and Physical, chart, labs and discussed the procedure including the risks, benefits and alternatives for the proposed anesthesia with the patient or authorized representative who has indicated his/her understanding and acceptance.   Dental advisory given  Plan Discussed with: CRNA, Surgeon and Anesthesiologist  Anesthesia Plan Comments:       Anesthesia Quick Evaluation

## 2011-08-16 NOTE — H&P (Signed)
  No change in health status from admission H&P.  He understands that he has an unstable right hip femoral neck fracture and that we are going to proceed to the OR for a right hip hemiarthroplasty vs a total hip.  I have explained the risks and benefits in detail and he does wish to proceed.

## 2011-08-16 NOTE — Brief Op Note (Signed)
08/14/2011 - 08/16/2011  2:45 PM  PATIENT:  [name]  77 y.o. male  PRE-OPERATIVE DIAGNOSIS:  Right hip femoral neck fracture  POST-OPERATIVE DIAGNOSIS:  Right hip femoral neck fracture  PROCEDURE:  Procedure(s) (LRB): TOTAL HIP ARTHROPLASTY ANTERIOR APPROACH (Right)  SURGEON:  Surgeon(s) and Role:    * Kathryne Hitch, MD - Primary  PHYSICIAN ASSISTANT:   ASSISTANTS: Patrick Jupiter, RNFA   ANESTHESIA:   general  EBL:  Total I/O In: 1400 [I.V.:1400] Out: 875 [Urine:425; Blood:450]  BLOOD ADMINISTERED:none  DRAINS: none and Gastrostomy Tube   LOCAL MEDICATIONS USED:  NONE  SPECIMEN:  No Specimen  DISPOSITION OF SPECIMEN:  N/A  COUNTS:  YES  TOURNIQUET:  * No tourniquets in log *  DICTATION: .Other Dictation: Dictation Number [id number]  PLAN OF CARE: Admit to inpatient   PATIENT DISPOSITION:  PACU - hemodynamically stable.   Delay start of Pharmacological VTE agent (>24hrs) due to surgical blood loss or risk of bleeding: no

## 2011-08-16 NOTE — Progress Notes (Addendum)
[facility] PROGRESS NOTE  [name] AVW:[id number] DOB: 09-May-1934 DOA: 08/14/2011 PCP: Thora Lance, MD  Brief narrative: Admitted for fall, found to have right femur fracture.  Consultants:  Orthopedic surgery  Procedures:  Total hip replacement today.  Antibiotics:  none  HPI/Subjective: Right hip pain persists.  Objective: Filed Vitals:   08/15/11 2027 08/15/11 2207 08/16/11 0619 08/16/11 0738  BP:  136/61 159/59   Pulse:  73 69   Temp:  98.2 F (36.8 C) 99.1 F (37.3 C)   TempSrc:  Oral Oral   Resp:  18 18   Height:      Weight:   56.4 kg (124 lb 5.4 oz)   SpO2: 97% 99% 95% 96%    Intake/Output Summary (Last 24 hours) at 08/16/11 0956 Last data filed at 08/16/11 0850  Gross per 24 hour  Intake    990 ml  Output   1550 ml  Net   -560 ml    Exam:   General: alert afebrile NO  Distress, very concerned about the surgery.   Cardiovascular: s1s2 heard  Respiratory: DECREASED air entry at bases  Abdomen: Soft NT ND BS+ Data Reviewed: Basic Metabolic Panel:  Lab 08/15/11 8295 08/14/11 1330  NA 138 136  K 4.1 4.0  CL 100 96  CO2 28 28  GLUCOSE 113* 127*  BUN 10 11  CREATININE 0.85 0.89  CALCIUM 9.2 9.6  MG -- --  PHOS -- --   Liver Function Tests:  Lab 08/15/11 0541  AST 18  ALT 9  ALKPHOS 82  BILITOT 0.7  PROT 6.4  ALBUMIN 3.2*   No results found for this basename: LIPASE:5,AMYLASE:5 in the last 168 hours No results found for this basename: AMMONIA:5 in the last 168 hours CBC:  Lab 08/15/11 0541 08/14/11 1330  WBC 5.4 9.4  NEUTROABS -- 7.9*  HGB 12.0* 13.0  HCT 34.9* 38.0*  MCV 96.4 96.2  PLT 123* 138*   Cardiac Enzymes:  Lab 08/14/11 1330  CKTOTAL 59  CKMB 2.5  CKMBINDEX --  TROPONINI <0.30   BNP: No components found with this basename: POCBNP:5 CBG: No results found for this basename: GLUCAP:5 in the last 168 hours  Recent Results (from the past 240 hour(s))  SURGICAL PCR SCREEN     Status:  Abnormal   Collection Time   08/15/11  7:46 AM      Component Value Range Status Comment   MRSA, PCR NEGATIVE  NEGATIVE Final    Staphylococcus aureus POSITIVE (*) NEGATIVE Final      Studies: Dg Chest 1 View  08/14/2011  *RADIOLOGY REPORT*  Clinical Data: Fall.  Right hip pain.  CHEST - 1 VIEW  Comparison: 12/01/2009  Findings: Atherosclerotic aorta noted with chronic scarring along the left lateral hemidiaphragm.  Prominent emphysema noted.  A right posterolateral 2nd rib fracture may be acute - correlate with tenderness in this vicinity.  The right anterolateral ninth rib fracture is more likely old given the appearance of callus.  No pneumothorax observed.  IMPRESSION:  1.  Discontinuous right second rib, possibly acute fracture - correlate with tenderness in this vicinity. 2.  Emphysema. 3.  Chronic scarring along the left lateral costophrenic angle.  Original Report Authenticated By: Dellia Cloud, M.D.   Dg Hip Complete Right  08/14/2011  *RADIOLOGY REPORT*  Clinical Data: Fall.  Right hip pain.  RIGHT HIP - COMPLETE 2+ VIEW  Comparison: 04/26/2011  Findings: Acute right femoral neck fracture noted, with a small  fragment along the medial side of the fracture, and equivocal varus angulation.  Spurring from the anterior inferior iliac spines noted.  A left common iliac artery stent is present. Old, healed fracture of the left inferior pubic ramus observed.  IMPRESSION:  1.  Acute right femoral neck fracture.  Original Report Authenticated By: Dellia Cloud, M.D.   Dg Femur Right  08/14/2011  *RADIOLOGY REPORT*  Clinical Data: Fall onto right side.  Right hip pain.  RIGHT FEMUR - 2 VIEW  Comparison: None.  Findings: Right femoral neck fracture noted. No other fracture is observed.  Bridging bony fusion noted between the femur and tibia, and femur and patella.  Atherosclerosis noted.  IMPRESSION:  1.  Acute right femoral neck fracture. 2.  Prior knee arthrodesis.  Original Report  Authenticated By: Dellia Cloud, M.D.   Ct Head Wo Contrast  08/14/2011  *RADIOLOGY REPORT*  Clinical Data:  FALL EXTREMITY PAIN,fall.  CT HEAD WITHOUT CONTRAST CT CERVICAL SPINE WITHOUT CONTRAST  Technique:  Multidetector CT imaging of the head and cervical spine was performed following the standard protocol without IV contrast. Multiplanar CT image reconstructions of the cervical spine were also generated.  Comparison: None  CT HEAD  Findings: Atherosclerotic and physiologic intracranial calcifications.  Mild atrophy. There is no evidence of acute intracranial hemorrhage, brain edema, mass lesion, acute infarction,   mass effect, or midline shift. Acute infarct may be inapparent on noncontrast CT.  No other intra-axial abnormalities are seen, and the ventricles and sulci are within normal limits in size and symmetry.   No abnormal extra-axial fluid collections or masses are identified.  No significant calvarial abnormality.  IMPRESSION: 1.  Negative CT head.  CT CERVICAL SPINE  Findings: Mild reversal of the normal cervical lordosis. There is a central protrusion C2-3.  Solid appearing fusion across the C3-4 interspace.  Previous instrumented ACDF C4-5, hardware intact without surrounding lucency.  There is persistent lucency across the interspace however.  There is solid appearing interbody fusion C5-6 and C6-7, as well as fusion across the posterior spinous processes of C5-6.  Negative for fracture.  No prevertebral soft tissue swelling.  Emphysematous changes in the visualized lung apices.  Calcified right carotid bifurcation plaque.  IMPRESSION:  1.  Negative for fracture or other acute bony abnormality. 2.  Postoperative changes C3-C7 as above. 3. Loss of the normal cervical spine lordosis, which may be secondary to positioning, spasm, or soft tissue injury. 4.  Right carotid bifurcation plaque.  Original Report Authenticated By: Osa Craver, M.D.   Ct Cervical Spine Wo  Contrast  08/14/2011  *RADIOLOGY REPORT*  Clinical Data:  FALL EXTREMITY PAIN,fall.  CT HEAD WITHOUT CONTRAST CT CERVICAL SPINE WITHOUT CONTRAST  Technique:  Multidetector CT imaging of the head and cervical spine was performed following the standard protocol without IV contrast. Multiplanar CT image reconstructions of the cervical spine were also generated.  Comparison: None  CT HEAD  Findings: Atherosclerotic and physiologic intracranial calcifications.  Mild atrophy. There is no evidence of acute intracranial hemorrhage, brain edema, mass lesion, acute infarction,   mass effect, or midline shift. Acute infarct may be inapparent on noncontrast CT.  No other intra-axial abnormalities are seen, and the ventricles and sulci are within normal limits in size and symmetry.   No abnormal extra-axial fluid collections or masses are identified.  No significant calvarial abnormality.  IMPRESSION: 1.  Negative CT head.  CT CERVICAL SPINE  Findings: Mild reversal of the normal cervical lordosis.  There is a central protrusion C2-3.  Solid appearing fusion across the C3-4 interspace.  Previous instrumented ACDF C4-5, hardware intact without surrounding lucency.  There is persistent lucency across the interspace however.  There is solid appearing interbody fusion C5-6 and C6-7, as well as fusion across the posterior spinous processes of C5-6.  Negative for fracture.  No prevertebral soft tissue swelling.  Emphysematous changes in the visualized lung apices.  Calcified right carotid bifurcation plaque.  IMPRESSION:  1.  Negative for fracture or other acute bony abnormality. 2.  Postoperative changes C3-C7 as above. 3. Loss of the normal cervical spine lordosis, which may be secondary to positioning, spasm, or soft tissue injury. 4.  Right carotid bifurcation plaque.  Original Report Authenticated By: Osa Craver, M.D.   Ct Hip Right Wo Contrast  08/14/2011  *RADIOLOGY REPORT*  Clinical Data: Further evaluation of  normal hip fracture.  CT OF THE RIGHT HIP WITHOUT CONTRAST  Technique:  Multidetector CT imaging was performed according to the standard protocol. Multiplanar CT image reconstructions were also generated.  Comparison: Plain films 08/13/2008  Findings: Again seen is the fracture is within the right femoral neck.  There is mild angulation, apex anteriorly.  Mild impaction along the posterior aspect of the fracture.  No subluxation or dislocation.  IMPRESSION: Right femoral neck fracture with apex anterior angulation and posterior impaction.  Original Report Authenticated By: Cyndie Chime, M.D.    Scheduled Meds:    . sodium chloride   Intravenous STAT  . enoxaparin  40 mg Subcutaneous Q24H  . feeding supplement  237 mL Oral BID BM  . Fluticasone-Salmeterol  1 puff Inhalation Q12H  . sodium chloride  3 mL Intravenous Q12H   Continuous Infusions:    . sodium chloride 50 mL/hr at 08/15/11 2218     Assessment/Plan:  1. Multiple falls in the last few months, ? Syncope:  Admitted to telemetry and -cardiac enzyme  Negative. - 2 D echo pending. EKG shows NSR.  Fall precautions  Iv fluids    2. Right femur neck fracture:  - pain control  - ortho consult for surgery possibly Total hip replacement today.  3. COPD:  No exacerbation  Continue with advair and nebs as needed.   4. Chest tenderness; ? Rib fracture.  Pain control.   5. Anemia: Normocytic, anemia panel pending.    6. PVD: Severe disease, affecting the lower extremities. No femoral pulse on the right side. Outpatient follow up with Vascular surgery as recommended.  7. Thrombocytopenia: Slight fall in platelet count when compared to yesterday. Will continue to monitor.   7. DVT prophylaxis  - lovenox.      Kathlen Mody, MD  [facility] Pager [phone]  If 7PM-7AM, please contact night-coverage [email or web address] Password [email or web address] 08/16/2011, 9:56 AM   LOS: 2 days

## 2011-08-16 NOTE — Anesthesia Postprocedure Evaluation (Signed)
Anesthesia Post Note  Patient: [name]  Procedure(s) Performed: Procedure(s) (LRB): TOTAL HIP ARTHROPLASTY ANTERIOR APPROACH (Right)  Anesthesia type: general  Patient location: PACU  Post pain: Pain level controlled  Post assessment: Patient's Cardiovascular Status Stable  Last Vitals:  Filed Vitals:   08/16/11 1600  BP: 168/68  Pulse: 70  Temp:   Resp: 19    Post vital signs: Reviewed and stable  Level of consciousness: sedated  Complications: No apparent anesthesia complications

## 2011-08-17 ENCOUNTER — Encounter (HOSPITAL_COMMUNITY): Payer: Self-pay | Admitting: Orthopaedic Surgery

## 2011-08-17 LAB — RETICULOCYTES: RBC.: 2.92 MIL/uL — ABNORMAL LOW (ref 4.22–5.81)

## 2011-08-17 LAB — BASIC METABOLIC PANEL
BUN: 13 mg/dL (ref 6–23)
Chloride: 99 mEq/L (ref 96–112)
Creatinine, Ser: 0.83 mg/dL (ref 0.50–1.35)
GFR calc Af Amer: >90 mL/min (ref >90)

## 2011-08-17 LAB — FOLATE: Folate: 13.2 ng/mL

## 2011-08-17 LAB — IRON AND TIBC
Saturation Ratios: 8 % — ABNORMAL LOW (ref 20–55)
UIBC: 153 ug/dL (ref 125–400)

## 2011-08-17 LAB — FERRITIN: Ferritin: 200 ng/mL (ref 22–322)

## 2011-08-17 LAB — CBC
HCT: 29 % — ABNORMAL LOW (ref 39.0–52.0)
Hemoglobin: 10.1 g/dL — ABNORMAL LOW (ref 13.0–17.0)
MCHC: 34.8 g/dL (ref 30.0–36.0)
MCV: 93.2 fL (ref 78.0–100.0)

## 2011-08-17 MED ORDER — ACETAMINOPHEN 500 MG PO TABS
500.0000 mg | ORAL_TABLET | Freq: Three times a day (TID) | ORAL | Status: DC
  Administered 2011-08-17 – 2011-08-19 (×7): 500 mg via ORAL
  Filled 2011-08-17 (×9): qty 1

## 2011-08-17 MED ORDER — WARFARIN SODIUM 2.5 MG PO TABS
2.5000 mg | ORAL_TABLET | Freq: Once | ORAL | Status: AC
  Administered 2011-08-17: 2.5 mg via ORAL
  Filled 2011-08-17: qty 1

## 2011-08-17 MED ORDER — CHLORHEXIDINE GLUCONATE CLOTH 2 % EX PADS
6.0000 | MEDICATED_PAD | Freq: Every day | CUTANEOUS | Status: DC
  Administered 2011-08-17 – 2011-08-19 (×3): 6 via TOPICAL

## 2011-08-17 MED ORDER — METHOCARBAMOL 750 MG PO TABS
750.0000 mg | ORAL_TABLET | Freq: Three times a day (TID) | ORAL | Status: DC
  Administered 2011-08-17 (×3): 750 mg via ORAL
  Filled 2011-08-17 (×6): qty 1

## 2011-08-17 MED ORDER — MUPIROCIN 2 % EX OINT
1.0000 "application " | TOPICAL_OINTMENT | Freq: Two times a day (BID) | CUTANEOUS | Status: DC
  Administered 2011-08-17 – 2011-08-19 (×5): 1 via NASAL
  Filled 2011-08-17 (×2): qty 22

## 2011-08-17 MED ORDER — GABAPENTIN 300 MG PO CAPS
300.0000 mg | ORAL_CAPSULE | Freq: Two times a day (BID) | ORAL | Status: DC
  Administered 2011-08-17 – 2011-08-19 (×5): 300 mg via ORAL
  Filled 2011-08-17 (×7): qty 1

## 2011-08-17 NOTE — Progress Notes (Signed)
ANTICOAGULATION CONSULT NOTE - Initial Consult  Pharmacy Consult for Coumadin Indication: VTE prophylaxis  Allergies  Allergen Reactions  . Lipitor (Atorvastatin Calcium)     myalgias  . Tiotropium Bromide Monohydrate     hoarseness    Patient Measurements: Height: 5\' 8"  (172.7 cm) Weight: 124 lb 4.8 oz (56.382 kg) (bed scale) IBW/kg (Calculated) : 68.4  Heparin Dosing Weight:    Vital Signs: Temp: 98.9 F (37.2 C) (07/03 0412) Temp src: Oral (07/03 0412) BP: 135/73 mmHg (07/03 0412) Pulse Rate: 93  (07/03 0412)  Labs:  Basename 08/17/11 0645 08/15/11 0541 08/14/11 1330  HGB -- 12.0* 13.0  HCT -- 34.9* 38.0*  PLT -- 123* 138*  APTT -- -- --  LABPROT 18.3* -- 13.3  INR 1.49 -- 0.99  HEPARINUNFRC -- -- --  CREATININE 0.83 0.85 0.89  CKTOTAL -- -- 59  CKMB -- -- 2.5  TROPONINI -- -- <0.30    Estimated Creatinine Clearance: 61.3 ml/min (by C-G formula based on Cr of 0.83).   Medical History: Past Medical History  Diagnosis Date  . Hyperlipidemia   . Chronic back pain   . History of colon polyps   . Spinal stenosis   . Cervical spondylosis   . Cancer 2004    prostate  . Peripheral neuropathy   . Anxiety   . COPD (chronic obstructive pulmonary disease)   . BPH (benign prostatic hypertrophy)   . ED (erectile dysfunction)   . Pernicious anemia   . Peripheral vascular disease   . Rheumatoid arthritis   . Osteoporosis     Medications:  Prescriptions prior to admission  Medication Sig Dispense Refill  . ALPRAZolam (XANAX) 1 MG tablet Take 1 mg by mouth at bedtime as needed. For anxiety/sleep.      . Cyanocobalamin (VITAMIN B-12 IJ) Inject 1 each as directed every 30 (thirty) days.      . Fluticasone-Salmeterol (ADVAIR DISKUS) 250-50 MCG/DOSE AEPB Inhale 1 puff into the lungs every 12 (twelve) hours.      . Multiple Vitamin (MULTIVITAMIN) tablet Take 1 tablet by mouth daily.      Marland Kitchen oxyCODONE-acetaminophen (PERCOCET) 5-325 MG per tablet Take 1 tablet by  mouth every 4 (four) hours as needed. For pain.      . rosuvastatin (CRESTOR) 20 MG tablet Take 20 mg by mouth daily.      . sildenafil (VIAGRA) 100 MG tablet Take 100 mg by mouth daily as needed. For E.D.        Assessment: s/p fall with R femur fx; R THA. INR 1.49 after only one dose of coumadin. Pt has low baseline plts.    Goal of Therapy:  INR 2-3 Monitor platelets by anticoagulation protocol: Yes   Plan:  Coumadin 2.5mg  PO x1 Daily PT/INR Initiated education with book/video.   Ulyses Southward [place] 08/17/2011,9:34 AM

## 2011-08-17 NOTE — Op Note (Signed)
NAMELUCIUS, WISE NO.:  [id number]  MEDICAL RECORD NO.:  [id number]  LOCATION:  4714                         FACILITY:  [facility]  PHYSICIAN:  Vanita Panda. Magnus Ivan, M.D.DATE OF BIRTH:  Dec 23, 1934  DATE OF PROCEDURE:  08/16/2011 DATE OF DISCHARGE:                              OPERATIVE REPORT   PREOPERATIVE DIAGNOSIS:  Right hip femoral neck fracture.  POSTOPERATIVE DIAGNOSIS:  Right hip femoral neck fracture.  PROCEDURE:  Right total hip arthroplasty through direct anterior approach.  IMPLANTS:  DePuy Sector Gription acetabular component, size 54 with a 2 screws, size 36+, 4 neutral polyethylene liner, Corail femoral component, size 11, size 36+, 1.5 metal hip ball.  SURGEON:  Vanita Panda. Magnus Ivan, MD  ASSISTANT:  Patrick Jupiter, RNFA  ANESTHESIA:  General.  BLOOD LOSS:  150 mL.  COMPLICATIONS:  None.  INDICATIONS:  Mr. Lavalley is a 77 year old gentleman who sustained a mechanical fall this weekend when he slipped and fell.  He was found to have a displaced femoral neck fracture of his right hip.  The complicating factors that he has basically a fused/ankylosed right knee joint, thus making posterior hip approach difficult for unipolar hemiarthroplasty.  He need to be able to bend the knee for this.  I was contacted by the consulting physician who inquired about me performing a direct anterior hip surgery for this.  I talked to the patient in detail about this and I told him that I would try to perform a hemiarthroplasty; however, he would most likely need a total hip replacement given the difficulty reducing large head through a direct anterior approach.  He understands this completely and does wish to proceed with surgery due to severe pain as well as the displaced fracture.  I showed him his x-rays and drew pictures and explained what we needed to do.  PROCEDURE DESCRIPTION:  After informed consent was obtained, appropriate right hip was  marked.  He was brought to the operating room, general anesthesia was obtained while he was on the stretcher.  Traction boots were placed on both his feet and he was placed in supine on the [place] fracture table with a perineal post in place.  No traction was applied to his legs as he was put in the leg traction devices.  His right hip was then prepped and draped with DuraPrep and sterile drapes.  Time-out was called to identify the correct patient and correct right hip.  I then made an incision just inferior and posterior to the anterior- superior iliac spine and carried this obliquely down the leg.  I dissected down to the tensor fascia lata and divided the tensor fascia obliquely as well.  I then proceeded with a direct anterior approach to the hip.  A Cobra retractor was placed around the lateral neck and then up underneath the rectus femoris.  A medial Cobra retractor was placed. I then coagulated the lateral femoral circumflex vessels and released tissue around the capsule.  I then divided the hip capsule and encountered a large hematoma.  I placed the Cobra retractors within the hip capsule.  I made my femoral neck cut then just proximal to the lesser trochanter  and removed the head in its entirety, which did show a completely displaced femoral neck fracture.  Once I removed the head in its entirety, I was able to trial a head and I want to trial a 53 head, but it was tight to get in this position.  I then decided that I would proceed with a total hip arthroplasty.  I cleaned the acetabulum debris and placed a bent Hohmann medially and a Cobra retractor laterally.  I then began reaming from a size 44 reamer and 2 mm increments all the way up to a 54 with last few reamers were placed under direct fluoroscopic guidance.  I did feel like I medialized quite a bit, so I used the first set of reamers with cancellous bone and some from the femoral head to place bone graft in centrally.  I  then was able to place the real size 54 acetabular component under direct visualization and fluoroscopy to obtain placement of the cup, inclination, anteversion and abduction.  I was pleased with all of this.  So once I had knocked the acetabulum into place, it was nice, stable. and secured.  I still filled two screw holes just to supplement this.  I then placed the real 36+ 4 neutral polyethylene liner.  Attention was then turned to the femur.  I was able externally rotate the leg to 90 degrees, extended and adduct the leg.  I placed a Mueller retractor medially and a Hohmann behind the greater trochanter.  I released the lateral capsule and then I used a box cutting guide to open up the femoral canal.  I broached from a size 8 broach up to 11 and 11 was felt to be stable.  I trialed a standard neck with a 36+ 1.5 hip ball where we brought the leg back over and up and then with traction and internal rotation, I was able to reduce the hip. His leg lengths remained stable and he was stable with internal and external rotation.  I then dislocated the hip and removed the trial components.  I placed the real Corail femoral component from DePuy size 11 with a collar and HA coating.  I placed the real 36+ 1.5 metal hip ball and reduced this back in the acetabulum and it was again stable.  I then copiously irrigated the soft tissue with normal saline.  I closed the joint capsule with interrupted #1 Ethibond suture.  I already used a running 0 V-Loc suture to close the tensor fascia lata followed by 2-0 Vicryl in the subcutaneous tissue and staples on the skin.  Well-padded sterile dressing was applied.  He was taken off the [place] table, and awakened.  His leg lengths were measured to be equal.  He was taken to the recovery room in stable condition.  All final counts were correct. There were no complications noted.     Vanita Panda. Magnus Ivan, M.D.     CYB/MEDQ  D:  08/16/2011  T:   08/17/2011  Job:  [id number]

## 2011-08-17 NOTE — Progress Notes (Signed)
Clinical Social Work Department BRIEF PSYCHOSOCIAL ASSESSMENT 08/17/2011  Patient:  [name], [name]     Account Number:  [id number]     Admit date:  08/14/2011  Clinical Social Worker:  Juliette Mangle  Date/Time:  08/17/2011 04:37 PM  Referred by:  Physician  Date Referred:  08/17/2011 Referred for  SNF Placement   Other Referral:   Interview type:  Patient Other interview type:    PSYCHOSOCIAL DATA Living Status:  ALONE Admitted from facility:   Level of care:   Primary support name:  Wood,Randy Primary support relationship to patient:  CHILD, ADULT Degree of support available:   Fair    CURRENT CONCERNS Current Concerns  Post-Acute Placement   Other Concerns:    SOCIAL WORK ASSESSMENT / PLAN CSW met with patient at bedisde. CSW introduced self, explained roled and discussed SNF placement. Patietn is agreeable to placement and reported that he has heard good things about [facility].   Assessment/plan status:  Psychosocial Support/Ongoing Assessment of Needs Other assessment/ plan:   Information/referral to community resources:   CSW provided patient with choice list.    PATIENT'S/FAMILY'S RESPONSE TO PLAN OF CARE: Patient was agreeable to SNF placement. Patient was appreciative of support and resources provided by CSW. CSW will continue to follow to assist with d/c planning.    Sabino Niemann, MSW, [employer] [phone]

## 2011-08-17 NOTE — Progress Notes (Signed)
Patient ID: [name]  male  JYN:[id number]    DOB: 03-20-1934    DOA: 08/14/2011  PCP: Thora Lance, MD  Subjective: Patient seen and examined, states pain is not well controlled, 6/10, also neuropathy in the feet bothering him significantly. No chest pain, shortness of breath, nausea vomiting, fever or chills.  Objective: Weight change: -0.018 kg (-0.6 oz)  Intake/Output Summary (Last 24 hours) at 08/17/11 1241 Last data filed at 08/17/11 1100  Gross per 24 hour  Intake 2532.5 ml  Output   1275 ml  Net 1257.5 ml   Blood pressure 135/73, pulse 93, temperature 98.9 F (37.2 C), temperature source Oral, resp. rate 20, height 5\' 8"  (1.727 m), weight 56.382 kg (124 lb 4.8 oz), SpO2 96.00%.  Physical Exam: General: Alert and awake, oriented x3, not in any acute distress. HEENT: anicteric sclera, pupils reactive to light and accommodation, EOMI CVS: S1-S2 clear, no murmur rubs or gallops Chest: clear to auscultation bilaterally, no wheezing, rales or rhonchi Abdomen: soft nontender, nondistended, normal bowel sounds, no organomegaly Extremities: no cyanosis, clubbing or edema noted bilaterally Neuro: Cranial nerves II-XII intact, no focal neurological deficits  Lab Results: Basic Metabolic Panel:  Lab 08/17/11 8657 08/15/11 0541  NA 134* 138  K 4.1 4.1  CL 99 100  CO2 26 28  GLUCOSE 126* 113*  BUN 13 10  CREATININE 0.83 0.85  CALCIUM 8.3* 9.2  MG -- --  PHOS -- --   Liver Function Tests:  Lab 08/15/11 0541  AST 18  ALT 9  ALKPHOS 82  BILITOT 0.7  PROT 6.4  ALBUMIN 3.2*   CBC:  Lab 08/15/11 0541 08/14/11 1330  WBC 5.4 9.4  NEUTROABS -- 7.9*  HGB 12.0* 13.0  HCT 34.9* 38.0*  MCV 96.4 96.2  PLT 123* 138*   Cardiac Enzymes:  Lab 08/14/11 1330  CKTOTAL 59  CKMB 2.5  CKMBINDEX --  TROPONINI <0.30   BNP: No components found with this basename: POCBNP:2 CBG: No results found for this basename: GLUCAP:5 in the last 168 hours   Micro Results: Recent  Results (from the past 240 hour(s))  SURGICAL PCR SCREEN     Status: Abnormal   Collection Time   08/15/11  7:46 AM      Component Value Range Status Comment   MRSA, PCR NEGATIVE  NEGATIVE Final    Staphylococcus aureus POSITIVE (*) NEGATIVE Final     Studies/Results: Dg Chest 1 View  08/14/2011  *RADIOLOGY REPORT*  Clinical Data: Fall.  Right hip pain.  CHEST - 1 VIEW  Comparison: 12/01/2009  Findings: Atherosclerotic aorta noted with chronic scarring along the left lateral hemidiaphragm.  Prominent emphysema noted.  A right posterolateral 2nd rib fracture may be acute - correlate with tenderness in this vicinity.  The right anterolateral ninth rib fracture is more likely old given the appearance of callus.  No pneumothorax observed.  IMPRESSION:  1.  Discontinuous right second rib, possibly acute fracture - correlate with tenderness in this vicinity. 2.  Emphysema. 3.  Chronic scarring along the left lateral costophrenic angle.  Original Report Authenticated By: Dellia Cloud, M.D.   Dg Hip Complete Right  08/14/2011  *RADIOLOGY REPORT*  Clinical Data: Fall.  Right hip pain.  RIGHT HIP - COMPLETE 2+ VIEW  Comparison: 04/26/2011  Findings: Acute right femoral neck fracture noted, with a small fragment along the medial side of the fracture, and equivocal varus angulation.  Spurring from the anterior inferior iliac spines noted.  A  left common iliac artery stent is present. Old, healed fracture of the left inferior pubic ramus observed.  IMPRESSION:  1.  Acute right femoral neck fracture.  Original Report Authenticated By: Dellia Cloud, M.D.   Dg Hip Operative Right  08/16/2011  *RADIOLOGY REPORT*  Clinical Data: Right femoral neck fracture.  DG OPERATIVE RIGHT HIP  Comparison: CT scan dated 08/14/2011  Findings: AP C-arm images demonstrate the acetabular and femoral components to be in good position with no visible fracture.  IMPRESSION: Right total hip prosthesis appears in good  position in the AP projection.  Original Report Authenticated By: Gwynn Burly, M.D.   Dg Femur Right  08/14/2011  *RADIOLOGY REPORT*  Clinical Data: Fall onto right side.  Right hip pain.  RIGHT FEMUR - 2 VIEW  Comparison: None.  Findings: Right femoral neck fracture noted. No other fracture is observed.  Bridging bony fusion noted between the femur and tibia, and femur and patella.  Atherosclerosis noted.  IMPRESSION:  1.  Acute right femoral neck fracture. 2.  Prior knee arthrodesis.  Original Report Authenticated By: Dellia Cloud, M.D.   Ct Head Wo Contrast  08/14/2011    IMPRESSION: 1.  Negative CT head.  Original Report Authenticated By: Osa Craver, M.D.   Ct Cervical Spine Wo Contrast  08/14/2011  IMPRESSION:  1.  Negative for fracture or other acute bony abnormality. 2.  Postoperative changes C3-C7 as above. 3. Loss of the normal cervical spine lordosis, which may be secondary to positioning, spasm, or soft tissue injury. 4.  Right carotid bifurcation plaque.  Original Report Authenticated By: Osa Craver, M.D.   Dg Pelvis Portable  08/16/2011   IMPRESSION: Uncomplicated new right total hip arthroplasty.  Original Report Authenticated By: Andreas Newport, M.D.   Ct Hip Right Wo Contrast  08/14/2011 IMPRESSION: Right femoral neck fracture with apex anterior angulation and posterior impaction.  Original Report Authenticated By: Cyndie Chime, M.D.   Dg Hip Portable 1 View Right  08/16/2011   IMPRESSION: Uncomplicated right total hip arthroplasty.  Original Report Authenticated By: Andreas Newport, M.D.    Medications: Scheduled Meds:   . acetaminophen  500 mg Oral TID  .  ceFAZolin (ANCEF) IV  1 g Intravenous Q6H  . Chlorhexidine Gluconate Cloth  6 each Topical Daily  . coumadin book   Does not apply Once  . docusate sodium  100 mg Oral BID  . feeding supplement  237 mL Oral BID BM  . Fluticasone-Salmeterol  1 puff Inhalation Q12H  . gabapentin  300  mg Oral BID  . methocarbamol (ROBAXIN) IV  500 mg Intravenous To PACU  . methocarbamol  750 mg Oral TID  . mupirocin ointment  1 application Nasal BID  . sodium chloride  3 mL Intravenous Q12H  . warfarin  2.5 mg Oral ONCE-1800  . warfarin  5 mg Oral ONCE-1800  . warfarin   Does not apply Once  . Warfarin - Pharmacist Dosing Inpatient   Does not apply q1800  . DISCONTD: enoxaparin  40 mg Subcutaneous Q24H   Continuous Infusions:   . sodium chloride 1,000 mL (08/17/11 0815)  . DISCONTD: sodium chloride 50 mL/hr at 08/15/11 2218     Assessment/Plan:  1. Multiple falls in the last few months, ? Syncope: Patient however reports that he was losing his balance. Patient was admitted to telemetry monitored floor, cardiac enzymes x1 was negative, 2-D echo pending, Fall precautions   2. Right femur neck  fracture: Status post total hip replacement surgery, post op day 1 - pain not well controlled, and adjust medications, orthopedics following, start PT   3. COPD: Stable, No exacerbation  - Continue with advair and nebs as needed.   4. Chest tenderness; ? Rib fracture.  - Pain control.   5. Anemia:  - Will order anemia panel, obtain CBC, postop  6. PVD:  Severe disease, affecting the lower extremities. No femoral pulse on the right side. Outpatient follow up with Vascular surgery as recommended.   7. Thrombocytopenia:  Ordered CBC for H&H and platelet count.   DVT Prophylaxis: Lovenox  Code Status: Full code  Disposition: Start PT, will likely need STR   LOS: 3 days   Khaleah Duer M.D. [facility] 08/17/2011, 12:41 PM Pager: [phone]  If 7PM-7AM, please contact night-coverage [email or web address] Password [email or web address]

## 2011-08-17 NOTE — Progress Notes (Addendum)
Clinical Social Work Department CLINICAL SOCIAL WORK PLACEMENT NOTE 08/17/2011  Patient:  [name], [name]  Account Number:  [id number] Admit date:  08/14/2011  Clinical Social Worker:  AMY Lubertha Basque  Date/time:  08/17/2011 04:31 PM  Clinical Social Work is seeking post-discharge placement for this patient at the following level of care:   SKILLED NURSING   (*CSW will update this form in Epic as items are completed)   08/17/2011  Patient/family provided with [facility] Department of Clinical Social Work's list of facilities offering this level of care within the geographic area requested by the patient (or if unable, by the patient's family).  08/17/2011  Patient/family informed of their freedom to choose among providers that offer the needed level of care, that participate in Medicare, Medicaid or managed care program needed by the patient, have an available bed and are willing to accept the patient.  08/17/2011  Patient/family informed of [facility]' ownership interest in [facility], as well as of the fact that they are under no obligation to receive care at this facility.  PASARR submitted to EDS on 08/17/2011 PASARR number received from EDS on 08/17/2011  FL2 transmitted to all facilities in geographic area requested by pt/family on  08/17/2011 FL2 transmitted to all facilities within larger geographic area on   Patient informed that his/her managed care company has contracts with or will negotiate with  certain facilities, including the following:     Patient/family informed of bed offers received: 08/18/11 Alvina Chou, LCSW)  Patient chooses bed at  [facility]  Physician recommends and patient chooses bed at    Patient to be transferred to    on   Patient to be transferred to facility by   The following physician request were entered in Epic:   Additional Comments: Patient would like [facility]. 08/18/11 - Patient indicated that he will talk  with his family regarding SNF choice, especially since [facility] has no bed availability.  Alvina Chou, LCSW  Amy Riley Kill, MSW, [place] [phone]

## 2011-08-17 NOTE — Progress Notes (Signed)
Subjective: 1 Day Post-Op Procedure(s) (LRB): TOTAL HIP ARTHROPLASTY ANTERIOR APPROACH (Right) Patient reports pain as moderate.    Objective: Vital signs in last 24 hours: Temp:  [97.2 F (36.2 C)-98.9 F (37.2 C)] 98.9 F (37.2 C) (07/03 0412) Pulse Rate:  [68-93] 93  (07/03 0412) Resp:  [12-20] 16  (07/03 0412) BP: (106-174)/(56-105) 135/73 mmHg (07/03 0412) SpO2:  [94 %-100 %] 94 % (07/03 0412) Weight:  [56.382 kg (124 lb 4.8 oz)] 56.382 kg (124 lb 4.8 oz) (07/03 0412)  Intake/Output from previous day: 07/02 0701 - 07/03 0700 In: 2412.5 [P.O.:240; I.V.:2172.5] Out: 1400 [Urine:950; Blood:450] Intake/Output this shift:     Basename 08/15/11 0541 08/14/11 1330  HGB 12.0* 13.0    Basename 08/15/11 0541 08/14/11 1330  WBC 5.4 9.4  RBC 3.62* 3.95*  HCT 34.9* 38.0*  PLT 123* 138*    Basename 08/15/11 0541 08/14/11 1330  NA 138 136  K 4.1 4.0  CL 100 96  CO2 28 28  BUN 10 11  CREATININE 0.85 0.89  GLUCOSE 113* 127*  CALCIUM 9.2 9.6    Basename 08/14/11 1330  LABPT --  INR 0.99    Sensation intact distally Dorsiflexion/Plantar flexion intact Incision: dressing C/D/I  Assessment/Plan: 1 Day Post-Op Procedure(s) (LRB): TOTAL HIP ARTHROPLASTY ANTERIOR APPROACH (Right) Up with therapy WBAT right hip, no hip precautions, ADL's  Lexee Brashears Y 08/17/2011, 7:06 AM

## 2011-08-17 NOTE — Evaluation (Signed)
Physical Therapy Evaluation Patient Details Name: MIGUELANGEL KORN MRN: [id number] DOB: 1934/02/21 Today's Date: 08/17/2011 Time: 7829-5621 PT Time Calculation (min): 36 min  PT Assessment / Plan / Recommendation Clinical Impression       PT Assessment  Patient needs continued PT services    Follow Up Recommendations  Skilled nursing facility;Supervision/Assistance - 24 hour    Barriers to Discharge Decreased caregiver support (pt will)      Equipment Recommendations  Defer to next venue    Recommendations for Other Services     Frequency 7X/week    Precautions / Restrictions Precautions Precautions: Anterior Hip;Fall Restrictions Weight Bearing Restrictions: Yes RLE Weight Bearing: Weight bearing as tolerated   Pertinent Vitals/Pain Pt reports 9/10 pain in R anterior hip. Rn medicated pt prior to PT session.       Mobility  Bed Mobility Bed Mobility: Sit to Supine Sit to Supine: 6: Modified independent (Device/Increase time);HOB flat Details for Bed Mobility Assistance: Assist for rt. LE and use of pad to facilitate hips Transfers Transfers: Sit to Stand;Stand to Sit Sit to Stand: 4: Min assist;With upper extremity assist;From chair/3-in-1;With armrests (2 trials) Stand to Sit: 4: Min assist;To chair/3-in-1;To bed;With armrests (two trials) Details for Transfer Assistance: Mod verbal cues for hand placement and technique for safety Ambulation/Gait Ambulation/Gait Assistance: 4: Min assist Ambulation Distance (Feet): 20 Feet Assistive device: Rolling walker Ambulation/Gait Assistance Details: cues for sequencing and WBAT on R LE. Pt instructed in proper use of RW as pt ambulates too close to walker.  Gait Pattern: Step-to pattern;Decreased stride length;Decreased weight shift to right Stairs: No Wheelchair Mobility Wheelchair Mobility: No    Exercises Total Joint Exercises Ankle Circles/Pumps: Both;10 reps Quad Sets: Both;5 reps;Supine Gluteal Sets: Both;5  reps;Supine   PT Diagnosis: Difficulty walking;Acute pain  PT Problem List:   PT Treatment Interventions: Gait training;DME instruction;Functional mobility training;Therapeutic activities;Therapeutic exercise;Patient/family education   PT Goals Acute Rehab PT Goals PT Goal Formulation: With patient Time For Goal Achievement: 08/17/11 Potential to Achieve Goals: Good Pt will go Supine/Side to Sit: with modified independence;with HOB 0 degrees PT Goal: Supine/Side to Sit - Progress: Goal set today Pt will go Sit to Supine/Side: with modified independence;with HOB 0 degrees PT Goal: Sit to Supine/Side - Progress: Goal set today Pt will go Sit to Stand: with modified independence;with upper extremity assist PT Goal: Sit to Stand - Progress: Goal set today Pt will go Stand to Sit: with modified independence;with upper extremity assist PT Goal: Stand to Sit - Progress: Goal set today Pt will Transfer Bed to Chair/Chair to Bed: with modified independence PT Transfer Goal: Bed to Chair/Chair to Bed - Progress: Goal set today Pt will Ambulate: 51 - 150 feet;with modified independence;with least restrictive assistive device PT Goal: Ambulate - Progress: Goal set today Pt will Perform Home Exercise Program: Independently PT Goal: Perform Home Exercise Program - Progress: Goal set today  Visit Information  Last PT Received On: 08/17/11 Assistance Needed: +1    Subjective Data  Subjective: This is rough. I have a lot of pain Patient Stated Goal: Be able to walk independently   Prior Functioning  Home Living Lives With: Alone Available Help at Discharge: Family;Available PRN/intermittently (son can check on him) Type of Home: House Home Access: Level entry Home Layout: One level Bathroom Shower/Tub: Engineer, manufacturing systems: Standard Home Adaptive Equipment: None Prior Function Level of Independence: Independent Able to Take Stairs?: No Driving: Yes Vocation:  Retired Comments: Pt. was having a difficult  time caring for himself due to bil foot pain because of neuropathy. Pt. cooking "junk foods" at home most of the time and only has energy to watch t.v most of the time Communication Communication: No difficulties    Cognition  Overall Cognitive Status: Appears within functional limits for tasks assessed/performed Orientation Level: Appears intact for tasks assessed;Oriented X4 / Intact Behavior During Session: [facility] for tasks performed    Extremity/Trunk Assessment Right Upper Extremity Assessment RUE ROM/Strength/Tone: Within functional levels RUE Sensation: WFL - Light Touch RUE Coordination: WFL - gross/fine motor Left Upper Extremity Assessment LUE ROM/Strength/Tone: Within functional levels LUE Sensation: WFL - Light Touch LUE Coordination: WFL - gross/fine motor Right Lower Extremity Assessment RLE ROM/Strength/Tone: Deficits RLE ROM/Strength/Tone Deficits: Gross hip and knee strength 3-/5 via MMT RLE Sensation: History of peripheral neuropathy RLE Coordination: Deficits Left Lower Extremity Assessment LLE ROM/Strength/Tone: Within functional levels LLE Sensation: History of peripheral neuropathy LLE Coordination: WFL - gross motor Trunk Assessment Trunk Assessment: Normal   Balance Balance Balance Assessed: No  End of Session PT - End of Session Equipment Utilized During Treatment: Gait belt Activity Tolerance: Patient tolerated treatment well Patient left: in bed;with call bell/phone within reach Nurse Communication: Mobility status  GP     Yee Joss 08/17/2011, 2:10 PM  Deyanira Fesler L. Luccia Reinheimer DPT [phone]

## 2011-08-17 NOTE — Evaluation (Signed)
Occupational Therapy Evaluation Patient Details Name: [name] MRN: [id number] DOB: December 08, 1934 Today's Date: 08/17/2011 Time: 0940-1000 OT Time Calculation (min): 20 min  OT Assessment / Plan / Recommendation Clinical Impression  Pt. presents s/p fall and with TOTAL HIP ARTHROPLASTY ANTERIOR APPROACH on rt. performed. Pt. with increased pain and will benefit from skilled OT to increase functional independence and get pt. to min assist-supervision level at D/C.    OT Assessment  Patient needs continued OT Services    Follow Up Recommendations  ST-Skilled nursing facility due to pt. Lives alone and will not have needed level assist;Home health OT ;supervision intermittent if family/friends able to provide and pending pt. Progress.   Barriers to Discharge Decreased caregiver support Only son can check in on him  Equipment Recommendations  Defer to next venue       Frequency  Min 2X/week    Precautions / Restrictions Precautions Precautions: Anterior Hip;Fall Restrictions Weight Bearing Restrictions: Yes RLE Weight Bearing: Weight bearing as tolerated   Pertinent Vitals/Pain 10/10 rt. Hip and bil feet; 4/5 rt. Shoulder "sore" after fall    ADL  Eating/Feeding: Simulated;Set up Where Assessed - Eating/Feeding: Chair Grooming: Performed;Wash/dry face;Set up;Min guard Where Assessed - Grooming: Unsupported standing Upper Body Bathing: Simulated;Set up Where Assessed - Upper Body Bathing: Supported sitting Lower Body Bathing: Simulated;Moderate assistance Where Assessed - Lower Body Bathing: Unsupported sit to stand Upper Body Dressing: Performed;Set up (don gown) Where Assessed - Upper Body Dressing: Supported sitting Lower Body Dressing: Performed;Moderate assistance (with donning rt. sock) Where Assessed - Lower Body Dressing: Unsupported sit to stand Toilet Transfer: Simulated;Moderate assistance Toilet Transfer Method: Stand pivot Toilet Transfer Equipment: Other  (comment) (recliner) Toileting - Clothing Manipulation and Hygiene: Performed;Moderate assistance ( holding gown up and support for urinal) Where Assessed - Toileting Clothing Manipulation and Hygiene: Standing Tub/Shower Transfer Method: Not assessed Equipment Used: Rolling walker Transfers/Ambulation Related to ADLs: Mod verbal cues for hand placement and transfer technique ADL Comments: Pt. educated on techniques for completing LB ADLs due to increased pain causing limitations. Pt. educated on WBAT and also introduced to concept of using AE to complete AE.    OT Diagnosis: Acute pain  OT Problem List: Decreased strength;Decreased activity tolerance;Impaired balance (sitting and/or standing);Decreased safety awareness;Decreased knowledge of use of DME or AE;Decreased knowledge of precautions;Pain OT Treatment Interventions: Self-care/ADL training;DME and/or AE instruction;Therapeutic activities;Patient/family education;Balance training   OT Goals Acute Rehab OT Goals OT Goal Formulation: With patient Time For Goal Achievement: 08/24/11 Potential to Achieve Goals: Good ADL Goals Pt Will Perform Grooming: with set-up;with supervision;Standing at sink ADL Goal: Grooming - Progress: Goal set today Pt Will Perform Lower Body Bathing: with set-up;with supervision;Sit to stand from chair;with adaptive equipment ADL Goal: Lower Body Bathing - Progress: Goal set today Pt Will Perform Lower Body Dressing: with set-up;with supervision;Sit to stand from chair;with adaptive equipment ADL Goal: Lower Body Dressing - Progress: Goal set today Pt Will Transfer to Toilet: with supervision;with DME;Regular height toilet;Ambulation ADL Goal: Toilet Transfer - Progress: Goal set today Pt Will Perform Tub/Shower Transfer: Tub transfer;with supervision;with DME;Ambulation;Shower seat with back;Maintaining weight bearing status ADL Goal: Web designer - Progress: Goal set today Additional ADL Goal #1:  Pt. will complete bed mobility with supervision in prep for BADLs ADL Goal: Additional Goal #1 - Progress: Goal set today  Visit Information  Last OT Received On: 08/17/11 Assistance Needed: +1    Subjective Data  Subjective: "My leg is fused" Patient Stated Goal: "Get moving to  the bathroom"   Prior Functioning  Home Living Lives With: Alone Available Help at Discharge: Family;Available PRN/intermittently (son can check on him) Type of Home: House Home Access: Level entry Home Layout: One level Bathroom Shower/Tub: Engineer, manufacturing systems: Standard Home Adaptive Equipment: None Prior Function Level of Independence: Independent Able to Take Stairs?: No Driving: Yes Vocation: Retired Comments: Pt. was having a difficult time caring for himself due to bil foot pain because of neuropathy. Pt. cooking "junk foods" at home most of the time and only has energy to watch t.v most of the time. Communication Communication: No difficulties    Cognition  Overall Cognitive Status: Appears within functional limits for tasks assessed/performed Orientation Level: Appears intact for tasks assessed;Oriented X4 / Intact Behavior During Session: [facility] for tasks performed    Extremity/Trunk Assessment Right Upper Extremity Assessment RUE ROM/Strength/Tone: Within functional levels RUE Sensation: WFL - Light Touch RUE Coordination: WFL - gross/fine motor Left Upper Extremity Assessment LUE ROM/Strength/Tone: Within functional levels LUE Sensation: WFL - Light Touch LUE Coordination: WFL - gross/fine motor   Mobility Bed Mobility Bed Mobility: Supine to Sit;Sitting - Scoot to Edge of Bed Supine to Sit: 3: Mod assist;With rails;HOB elevated (~45 degrees) Sitting - Scoot to Edge of Bed: 4: Min assist Details for Bed Mobility Assistance: Assist for rt. LE and use of pad to facilitate hips Transfers Transfers: Sit to Stand;Stand to Sit Sit to Stand: 4: Min assist;With upper extremity  assist;From bed (elevated bed) Stand to Sit: 4: Min assist;To chair/3-in-1 Details for Transfer Assistance: Mod verbal cues for hand placement and technique for safety         End of Session OT - End of Session Equipment Utilized During Treatment: Gait belt Activity Tolerance: Patient tolerated treatment well Patient left: in chair;with call bell/phone within reach Nurse Communication: Mobility status       Kaleiah Kutzer, OTR/L Pager [phone] 08/17/2011, 10:20 AM

## 2011-08-17 NOTE — Progress Notes (Signed)
*  PRELIMINARY RESULTS* Echocardiogram 2D Echocardiogram with Definity has been performed.  Zailah Zagami 08/17/2011, 4:09 PM

## 2011-08-18 LAB — CBC
Hemoglobin: 8.4 g/dL — ABNORMAL LOW (ref 13.0–17.0)
MCH: 32.8 pg (ref 26.0–34.0)
MCHC: 34.7 g/dL (ref 30.0–36.0)
MCV: 94.5 fL (ref 78.0–100.0)
Platelets: 132 10*3/uL — ABNORMAL LOW (ref 150–400)
RBC: 2.56 MIL/uL — ABNORMAL LOW (ref 4.22–5.81)

## 2011-08-18 LAB — BASIC METABOLIC PANEL
BUN: 15 mg/dL (ref 6–23)
Chloride: 98 mEq/L (ref 96–112)
Glucose, Bld: 105 mg/dL — ABNORMAL HIGH (ref 70–99)
Potassium: 4.5 mEq/L (ref 3.5–5.1)

## 2011-08-18 LAB — PROTIME-INR
INR: 2.7 — ABNORMAL HIGH (ref 0.00–1.49)
Prothrombin Time: 29.1 seconds — ABNORMAL HIGH (ref 11.6–15.2)

## 2011-08-18 MED ORDER — BISACODYL 10 MG RE SUPP: 10.0000 mg | Freq: Every day | RECTAL | Status: DC | PRN

## 2011-08-18 MED ORDER — OXYCODONE-ACETAMINOPHEN 5-325 MG PO TABS
1.0000 | ORAL_TABLET | Freq: Four times a day (QID) | ORAL | Status: DC
  Administered 2011-08-18 – 2011-08-19 (×5): 1 via ORAL
  Filled 2011-08-18 (×6): qty 1

## 2011-08-18 MED ORDER — VITAMIN K1 10 MG/ML IJ SOLN
10.0000 mg | Freq: Once | INTRAVENOUS | Status: AC
  Administered 2011-08-18: 10 mg via INTRAVENOUS
  Filled 2011-08-18: qty 1

## 2011-08-18 MED ORDER — ASPIRIN 325 MG PO TABS
325.0000 mg | ORAL_TABLET | Freq: Two times a day (BID) | ORAL | Status: DC
  Administered 2011-08-19: 325 mg via ORAL
  Filled 2011-08-18 (×3): qty 1

## 2011-08-18 MED ORDER — PHYTONADIONE 5 MG PO TABS
2.5000 mg | ORAL_TABLET | Freq: Once | ORAL | Status: AC
  Administered 2011-08-18: 2.5 mg via ORAL
  Filled 2011-08-18: qty 1

## 2011-08-18 MED ORDER — METHOCARBAMOL 750 MG PO TABS
750.0000 mg | ORAL_TABLET | Freq: Four times a day (QID) | ORAL | Status: DC
  Administered 2011-08-18 – 2011-08-19 (×6): 750 mg via ORAL
  Filled 2011-08-18 (×8): qty 1

## 2011-08-18 NOTE — Progress Notes (Signed)
Patient ID: [name], male   DOB: [birth year], 78 y.o.   MRN: [id number] Will stop coumadin due to INR>5.  Will start aspirin 325 mg twice daily strating tomorrow 7/5.

## 2011-08-18 NOTE — Plan of Care (Signed)
Problem: Phase III Progression Outcomes Goal: Ambulate BID with assist as able Outcome: Progressing Up with PT this am and sat in recliner for a while.  Standard pain med and PRN given

## 2011-08-18 NOTE — Progress Notes (Signed)
Critical lab value called, INR 5.77, Dr. Isidoro Donning called and new orders given. Will continue to monitor.

## 2011-08-18 NOTE — Progress Notes (Signed)
Pt's INR 5.05 notified by Loraine Maple and verified per lab results.  Dr.Rai texed page with results.  Will cont. To monitor. Amanda Pea, Charity fundraiser.

## 2011-08-18 NOTE — Progress Notes (Signed)
Physical Therapy Treatment Patient Details Name: [name] MRN: [id number] DOB: 1934-09-11 Today's Date: 08/18/2011 Time: 6962-9528 PT Time Calculation (min): 20 min  PT Assessment / Plan / Recommendation Comments on Treatment Session  Patient is progressing well with his functional mobility and requires rehab to maximize his full potential at discharge.     Follow Up Recommendations  Skilled nursing facility    Barriers to Discharge  None      Equipment Recommendations  Defer to next venue    Recommendations for Other Services  None  Frequency 7X/week   Plan Discharge plan remains appropriate;Frequency remains appropriate    Precautions / Restrictions Precautions Precautions: Fall;Anterior Hip Restrictions RLE Weight Bearing: Weight bearing as tolerated   Pertinent Vitals/Pain Patient pre-medicated secondary to surgical pain of right hip.    Mobility  Bed Mobility Supine to Sit: 4: Min assist Sitting - Scoot to Edge of Bed: 4: Min guard Details for Bed Mobility Assistance: Assistance required for right leg movement to edge of bed secondary to inability to raise against gravity Transfers Sit to Stand: With upper extremity assist;From bed;4: Min assist Stand to Sit: 4: Min guard;With upper extremity assist;To chair/3-in-1 Details for Transfer Assistance: Patient with difficulty with mid range of stand secondary to fused knee, weak upper body and pain with weight bearing.  Ambulation/Gait Ambulation/Gait Assistance: 4: Min guard Ambulation Distance (Feet): 40 Feet Assistive device: Rolling walker Ambulation/Gait Assistance Details: Again required verbal cues secondary to tendency to not advance walker with stepping resulting in patient being too close to walker. Advised on PWB gait pattern if pain with step through was too high.  Gait Pattern: Decreased stance time - right;Decreased weight shift to right    Exercises Total Joint Exercises Ankle Circles/Pumps:  AROM;Supine Quad Sets: AROM;Right;10 reps;Supine Gluteal Sets: AROM;Right;10 reps;Supine    PT Goals Acute Rehab PT Goals PT Goal: Supine/Side to Sit - Progress: Progressing toward goal PT Goal: Sit to Stand - Progress: Progressing toward goal PT Goal: Stand to Sit - Progress: Progressing toward goal PT Transfer Goal: Bed to Chair/Chair to Bed - Progress: Progressing toward goal PT Goal: Ambulate - Progress: Progressing toward goal PT Goal: Perform Home Exercise Program - Progress: Progressing toward goal  Visit Information  Last PT Received On: 08/18/11 Assistance Needed: +1    Subjective Data  Subjective: Patient reported lots of pain this ma and so returned once pre-medicated Patient Stated Goal: Prevent his continued decline in health   Cognition  Overall Cognitive Status: Appears within functional limits for tasks assessed/performed Arousal/Alertness: Awake/alert Orientation Level: Appears intact for tasks assessed Behavior During Session: [facility] for tasks performed    Balance   Close guarding with walker  End of Session PT - End of Session Equipment Utilized During Treatment: Gait belt Activity Tolerance: Patient tolerated treatment well Patient left: in bed;with call bell/phone within reach Nurse Communication: Mobility status    Edwyna Perfect, PT  Pager [phone]  08/18/2011, 10:15 AM

## 2011-08-18 NOTE — Progress Notes (Addendum)
Referral for d/c needs, noted plan to d/c to SNF for short term rehab. Referral to CSW, CM will follow for any other needs.  Johny Shock RN MPH [phone]    CARE MANAGEMENT NOTE 08/18/2011  Patient:  [name], [name]   Account Number:  [id number]  Date Initiated:  08/18/2011  Documentation initiated by:  Johny Shock  Subjective/Objective Assessment:   Order for [facility] vs SNF     Action/Plan:   Noted plan for short SNF on 08/18/2011 and per PT recommendation.   Anticipated DC Date:  08/21/2011   Anticipated DC Plan:  SKILLED NURSING FACILITY         Choice offered to / List presented to:             Status of service:  Completed, signed off Medicare Important Message given?   (If response is "NO", the following Medicare IM given date fields will be blank) Date Medicare IM given:   Date Additional Medicare IM given:    Discharge Disposition:  SKILLED NURSING FACILITY  Per UR Regulation:    If discussed at Long Length of Stay Meetings, dates discussed:    Comments:

## 2011-08-18 NOTE — Progress Notes (Signed)
Patient ID: [name]  male  ZOX:[id number]    DOB: [birth year]    DOA: 08/14/2011  PCP: Thora Lance, MD  Subjective: Patient seen and examined, states states pain is not controlled, cannot move in the bed secondary to pain. No chest pain, shortness of breath, nausea vomiting, fever or chills.  Objective: Weight change: 1.27 kg (2 lb 12.8 oz)  Intake/Output Summary (Last 24 hours) at 08/18/11 1236 Last data filed at 08/18/11 0900  Gross per 24 hour  Intake    700 ml  Output    901 ml  Net   -201 ml   Blood pressure 128/49, pulse 76, temperature 98 F (36.7 C), temperature source Oral, resp. rate 18, height 5\' 8"  (1.727 m), weight 57.652 kg (127 lb 1.6 oz), SpO2 91.00%.  Physical Exam: General: Alert and awake, oriented x3, not in any acute distress. HEENT: anicteric sclera, pupils reactive to light and accommodation, EOMI CVS: S1-S2 clear, no murmur rubs or gallops Chest: clear to auscultation bilaterally, no wheezing, rales or rhonchi Abdomen: soft nontender, nondistended, normal bowel sounds, no organomegaly Extremities: no cyanosis, clubbing or edema noted bilaterally Neuro: Cranial nerves II-XII intact, no focal neurological deficits  Lab Results: Basic Metabolic Panel:  Lab 08/18/11 8119 08/17/11 0645  NA 135 134*  K 4.5 4.1  CL 98 99  CO2 27 26  GLUCOSE 105* 126*  BUN 15 13  CREATININE 0.82 0.83  CALCIUM 8.7 8.3*  MG -- --  PHOS -- --   Liver Function Tests:  Lab 08/15/11 0541  AST 18  ALT 9  ALKPHOS 82  BILITOT 0.7  PROT 6.4  ALBUMIN 3.2*   CBC:  Lab 08/18/11 0527 08/17/11 1321 08/14/11 1330  WBC 6.1 7.3 --  NEUTROABS -- -- 7.9*  HGB 8.4* 10.1* --  HCT 24.2* 29.0* --  MCV 94.5 93.2 --  PLT 132* 149* --   Cardiac Enzymes:  Lab 08/14/11 1330  CKTOTAL 59  CKMB 2.5  CKMBINDEX --  TROPONINI <0.30   BNP: No components found with this basename: POCBNP:2 CBG: No results found for this basename: GLUCAP:5 in the last 168 hours   Micro  Results: Recent Results (from the past 240 hour(s))  SURGICAL PCR SCREEN     Status: Abnormal   Collection Time   08/15/11  7:46 AM      Component Value Range Status Comment   MRSA, PCR NEGATIVE  NEGATIVE Final    Staphylococcus aureus POSITIVE (*) NEGATIVE Final     Studies/Results: Dg Chest 1 View  08/14/2011  *RADIOLOGY REPORT*  Clinical Data: Fall.  Right hip pain.  CHEST - 1 VIEW  Comparison: 12/01/2009  Findings: Atherosclerotic aorta noted with chronic scarring along the left lateral hemidiaphragm.  Prominent emphysema noted.  A right posterolateral 2nd rib fracture may be acute - correlate with tenderness in this vicinity.  The right anterolateral ninth rib fracture is more likely old given the appearance of callus.  No pneumothorax observed.  IMPRESSION:  1.  Discontinuous right second rib, possibly acute fracture - correlate with tenderness in this vicinity. 2.  Emphysema. 3.  Chronic scarring along the left lateral costophrenic angle.  Original Report Authenticated By: Dellia Cloud, M.D.   Dg Hip Complete Right  08/14/2011  *RADIOLOGY REPORT*  Clinical Data: Fall.  Right hip pain.  RIGHT HIP - COMPLETE 2+ VIEW  Comparison: 04/26/2011  Findings: Acute right femoral neck fracture noted, with a small fragment along the medial side of the fracture,  and equivocal varus angulation.  Spurring from the anterior inferior iliac spines noted.  A left common iliac artery stent is present. Old, healed fracture of the left inferior pubic ramus observed.  IMPRESSION:  1.  Acute right femoral neck fracture.  Original Report Authenticated By: Dellia Cloud, M.D.   Dg Hip Operative Right  08/16/2011  *RADIOLOGY REPORT*  Clinical Data: Right femoral neck fracture.  DG OPERATIVE RIGHT HIP  Comparison: CT scan dated 08/14/2011  Findings: AP C-arm images demonstrate the acetabular and femoral components to be in good position with no visible fracture.  IMPRESSION: Right total hip prosthesis  appears in good position in the AP projection.  Original Report Authenticated By: Gwynn Burly, M.D.   Dg Femur Right  08/14/2011  *RADIOLOGY REPORT*  Clinical Data: Fall onto right side.  Right hip pain.  RIGHT FEMUR - 2 VIEW  Comparison: None.  Findings: Right femoral neck fracture noted. No other fracture is observed.  Bridging bony fusion noted between the femur and tibia, and femur and patella.  Atherosclerosis noted.  IMPRESSION:  1.  Acute right femoral neck fracture. 2.  Prior knee arthrodesis.  Original Report Authenticated By: Dellia Cloud, M.D.   Ct Head Wo Contrast  08/14/2011    IMPRESSION: 1.  Negative CT head.  Original Report Authenticated By: Osa Craver, M.D.   Ct Cervical Spine Wo Contrast  08/14/2011  IMPRESSION:  1.  Negative for fracture or other acute bony abnormality. 2.  Postoperative changes C3-C7 as above. 3. Loss of the normal cervical spine lordosis, which may be secondary to positioning, spasm, or soft tissue injury. 4.  Right carotid bifurcation plaque.  Original Report Authenticated By: Osa Craver, M.D.   Dg Pelvis Portable  08/16/2011   IMPRESSION: Uncomplicated new right total hip arthroplasty.  Original Report Authenticated By: Andreas Newport, M.D.   Ct Hip Right Wo Contrast  08/14/2011 IMPRESSION: Right femoral neck fracture with apex anterior angulation and posterior impaction.  Original Report Authenticated By: Cyndie Chime, M.D.   Dg Hip Portable 1 View Right  08/16/2011   IMPRESSION: Uncomplicated right total hip arthroplasty.  Original Report Authenticated By: Andreas Newport, M.D.    Medications: Scheduled Meds:    . acetaminophen  500 mg Oral TID  . aspirin  325 mg Oral BID  . Chlorhexidine Gluconate Cloth  6 each Topical Daily  . docusate sodium  100 mg Oral BID  . feeding supplement  237 mL Oral BID BM  . Fluticasone-Salmeterol  1 puff Inhalation Q12H  . gabapentin  300 mg Oral BID  . methocarbamol  750 mg  Oral QID  . mupirocin ointment  1 application Nasal BID  . oxyCODONE-acetaminophen  1 tablet Oral Q6H  . phytonadione  2.5 mg Oral Once  . sodium chloride  3 mL Intravenous Q12H  . warfarin  2.5 mg Oral ONCE-1800  . DISCONTD: methocarbamol  750 mg Oral TID  . DISCONTD: Warfarin - Pharmacist Dosing Inpatient   Does not apply q1800   Continuous Infusions:    . sodium chloride 1,000 mL (08/17/11 0815)     Assessment/Plan:  1. Multiple falls in the last few months, ? Syncope: Patient however reports that he was losing his balance.  - No arrhythmias on telemetry, cardiac enzymes x1 was negative - 2-D echocardiogram done on 08/17/2011 with EF of 50-55% no regional wall motion abnormalities. Fall precautions   2. Right femur neck fracture: Status post total hip replacement  surgery, post op day 1 - pain not well controlled, and adjust medications, added scheduled Percocet today with Robaxin, Tylenol, PRN Dilaudid and oxycodone for breakthrough - orthopedics following, continue PT   3. COPD: Stable, No exacerbation  - Continue with advair and nebs as needed.   4 Anemia: Iron deficiency anemia with iron of 13, iron saturation of 8, postop - Will type and cross, hemoglobin 8.4 today down from 10.1. CBC in a.m. if less than 8 will transfuse 2 units - Once stable, patient will need outpatient followup with gastroenterology  5. PVD:  Severe disease, affecting the lower extremities. No femoral pulse on the right side. Outpatient follow up with Vascular surgery as recommended.   6. Thrombocytopenia:  - Platelet count stable for now  8. Coagulopathy with supratherapeutic INR 5.05 - Hold Coumadin, oral vitamin K 5 mg, started on aspirin BID by orthopedics  DVT Prophylaxis: INR 5.05, supratherapeutic  Code Status: Full code  Disposition:  Will need STR/SNF  LOS: 4 days   Rhythm Gubbels M.D. [facility] 08/18/2011, 12:36 PM Pager: [phone]  If 7PM-7AM, please contact  night-coverage [email or web address] Password [email or web address]

## 2011-08-18 NOTE — Plan of Care (Signed)
Problem: Phase III Progression Outcomes Goal: Anticoagulant follow-up in place Outcome: Progressing Pt's INR been 5.05 vitamin k administered per MD order.

## 2011-08-18 NOTE — Plan of Care (Signed)
Problem: Phase III Progression Outcomes Goal: Activity at appropriate level-compared to baseline (UP IN CHAIR FOR HEMODIALYSIS)  Outcome: Progressing Pt ambulate with PT today tolerated well.

## 2011-08-18 NOTE — Progress Notes (Signed)
Subjective: 2 Days Post-Op Procedure(s) (LRB): TOTAL HIP ARTHROPLASTY ANTERIOR APPROACH (Right) Patient reports pain as moderate.  Hgb down to 8.4 (acute blood loss anemia from surgery, asymptomatic thus far).  Slow mobility with therapy.  Objective: Vital signs in last 24 hours: Temp:  [98 F (36.7 C)-98.9 F (37.2 C)] 98 F (36.7 C) (07/04 0428) Pulse Rate:  [76-91] 76  (07/04 0428) Resp:  [18-20] 18  (07/04 0428) BP: (120-128)/(49-63) 128/49 mmHg (07/04 0428) SpO2:  [91 %-98 %] 96 % (07/04 0428) Weight:  [57.652 kg (127 lb 1.6 oz)] 57.652 kg (127 lb 1.6 oz) (07/04 0428)  Intake/Output from previous day: 07/03 0701 - 07/04 0700 In: 580 [P.O.:580] Out: 801 [Urine:800; Stool:1] Intake/Output this shift: Total I/O In: -  Out: 400 [Urine:400]   Basename 08/18/11 0527 08/17/11 1321  HGB 8.4* 10.1*    Basename 08/18/11 0527 08/17/11 1321  WBC 6.1 7.3  RBC 2.56* 3.11*  HCT 24.2* 29.0*  PLT 132* 149*    Basename 08/18/11 0527 08/17/11 0645  NA 135 134*  K 4.5 4.1  CL 98 99  CO2 27 26  BUN 15 13  CREATININE 0.82 0.83  GLUCOSE 105* 126*  CALCIUM 8.7 8.3*    Basename 08/18/11 0527 08/17/11 0645  LABPT -- --  INR PENDING 1.49    Sensation intact distally Dorsiflexion/Plantar flexion intact Incision: scant drainage  Assessment/Plan: 2 Days Post-Op Procedure(s) (LRB): TOTAL HIP ARTHROPLASTY ANTERIOR APPROACH (Right) Up with therapy. Continue coumadin. Will need ST-SNF placement. (needs FL-2 on chart).  Eliseo Withers Y 08/18/2011, 7:41 AM

## 2011-08-19 DIAGNOSIS — S7290XA Unspecified fracture of unspecified femur, initial encounter for closed fracture: Secondary | ICD-10-CM

## 2011-08-19 DIAGNOSIS — I739 Peripheral vascular disease, unspecified: Secondary | ICD-10-CM

## 2011-08-19 LAB — BASIC METABOLIC PANEL
CO2: 29 mEq/L (ref 19–32)
Calcium: 8.7 mg/dL (ref 8.4–10.5)
Chloride: 99 mEq/L (ref 96–112)
GFR calc Af Amer: >90 mL/min (ref >90)
GFR calc non Af Amer: 88 mL/min — ABNORMAL LOW (ref >90)
Glucose, Bld: 110 mg/dL — ABNORMAL HIGH (ref 70–99)
Potassium: 3.3 mEq/L — ABNORMAL LOW (ref 3.5–5.1)
Sodium: 136 mEq/L (ref 135–145)

## 2011-08-19 LAB — CBC
HCT: 23.9 % — ABNORMAL LOW (ref 39.0–52.0)
Hemoglobin: 8.4 g/dL — ABNORMAL LOW (ref 13.0–17.0)
MCH: 32.6 pg (ref 26.0–34.0)
MCHC: 35.1 g/dL (ref 30.0–36.0)
MCV: 92.6 fL (ref 78.0–100.0)
Platelets: 145 K/uL — ABNORMAL LOW (ref 150–400)
RBC: 2.58 MIL/uL — ABNORMAL LOW (ref 4.22–5.81)
RDW: 12.5 % (ref 11.5–15.5)
WBC: 6.3 K/uL (ref 4.0–10.5)

## 2011-08-19 MED ORDER — DSS 100 MG PO CAPS: 100.0000 mg | ORAL_CAPSULE | Freq: Two times a day (BID) | ORAL | Status: AC

## 2011-08-19 MED ORDER — POTASSIUM CHLORIDE CRYS ER 20 MEQ PO TBCR
40.0000 meq | EXTENDED_RELEASE_TABLET | Freq: Once | ORAL | Status: AC
  Administered 2011-08-19: 40 meq via ORAL
  Filled 2011-08-19: qty 2

## 2011-08-19 MED ORDER — GABAPENTIN 300 MG PO CAPS: 300.0000 mg | ORAL_CAPSULE | Freq: Three times a day (TID) | ORAL | Status: DC

## 2011-08-19 MED ORDER — ASPIRIN 325 MG PO TABS: 325.0000 mg | ORAL_TABLET | Freq: Two times a day (BID) | ORAL | Status: DC

## 2011-08-19 MED ORDER — OXYCODONE HCL 5 MG PO TABS: 5.0000 mg | ORAL_TABLET | ORAL | Status: DC | PRN

## 2011-08-19 MED ORDER — OXYCODONE-ACETAMINOPHEN 5-325 MG PO TABS: 1.0000 | ORAL_TABLET | Freq: Four times a day (QID) | ORAL | Status: DC

## 2011-08-19 MED ORDER — DIPHENHYDRAMINE HCL 12.5 MG/5ML PO ELIX: 12.5000 mg | ORAL_SOLUTION | ORAL | Status: DC | PRN

## 2011-08-19 MED ORDER — ALPRAZOLAM 1 MG PO TABS: 1.0000 mg | ORAL_TABLET | Freq: Every evening | ORAL | Status: DC | PRN

## 2011-08-19 MED ORDER — ENSURE COMPLETE PO LIQD: 237.0000 mL | Freq: Two times a day (BID) | ORAL | Status: DC

## 2011-08-19 MED ORDER — ACETAMINOPHEN 500 MG PO TABS: 500.0000 mg | ORAL_TABLET | Freq: Three times a day (TID) | ORAL | Status: AC

## 2011-08-19 MED ORDER — OXYCODONE-ACETAMINOPHEN 5-325 MG PO TABS
1.0000 | ORAL_TABLET | Freq: Four times a day (QID) | ORAL | Status: DC
Start: 2011-08-19 — End: 2011-08-19

## 2011-08-19 MED ORDER — OXYCODONE HCL 5 MG PO TABS: 5.0000 mg | ORAL_TABLET | ORAL | Status: AC | PRN

## 2011-08-19 MED ORDER — BISACODYL 10 MG RE SUPP: 10.0000 mg | Freq: Every day | RECTAL | Status: AC | PRN

## 2011-08-19 MED ORDER — METHOCARBAMOL 750 MG PO TABS: 750.0000 mg | ORAL_TABLET | Freq: Four times a day (QID) | ORAL | Status: AC

## 2011-08-19 NOTE — Progress Notes (Signed)
Report called to [facility] where pt is transferred to.  Received by [facility] nurse at facility.  Verbalized understanding.  Amanda Pea, Charity fundraiser.

## 2011-08-19 NOTE — Progress Notes (Signed)
.  Clinical social worker assisted with patient discharge to skilled nursing facility, [facility]. .Patient transportation provided by [facility] with patient chart copy. .No further Clinical Social Work needs, signing off.   Catha Gosselin, Theresia Majors  [phone] .08/19/2011 1506pm

## 2011-08-19 NOTE — Progress Notes (Signed)
Physical Therapy Treatment Patient Details Name: [name] MRN: [id number] DOB: 10/30/1934 Today's Date: 08/19/2011 Time: 5784-6962 PT Time Calculation (min): 21 min  PT Assessment / Plan / Recommendation Comments on Treatment Session  Patient continues to progress well with functional mobility.  Agree with need for inpatient therapy at discharge.    Follow Up Recommendations  Skilled nursing facility    Barriers to Discharge        Equipment Recommendations  Defer to next venue    Recommendations for Other Services    Frequency 7X/week   Plan Discharge plan remains appropriate;Frequency remains appropriate    Precautions / Restrictions Precautions Precautions: Fall;Anterior Hip Restrictions Weight Bearing Restrictions: Yes RLE Weight Bearing: Weight bearing as tolerated       Mobility  Bed Mobility Bed Mobility: Sit to Supine Sit to Supine: 4: Min assist Details for Bed Mobility Assistance: Min assist to bring LE's onto bed. Transfers Transfers: Sit to Stand;Stand to Sit Sit to Stand: 4: Min assist;With upper extremity assist;With armrests;From chair/3-in-1 Stand to Sit: 4: Min guard;With upper extremity assist;To bed Details for Transfer Assistance: Verbal cues for safe hand placement and proper positioning on bed. Ambulation/Gait Ambulation/Gait Assistance: 4: Min guard Ambulation Distance (Feet): 76 Feet Assistive device: Rolling walker Ambulation/Gait Assistance Details: Verbal cues to take longer step lengths during gait, and short steps during turns for safety. Gait Pattern: Step-to pattern;Decreased stance time - right;Trunk flexed Gait velocity: Slow gait speed    Exercises Total Joint Exercises Ankle Circles/Pumps: AROM;Both;10 reps;Supine Quad Sets: AROM;Right;10 reps;Supine Gluteal Sets: AROM;Both;10 reps;Supine     PT Goals Acute Rehab PT Goals PT Goal: Sit to Supine/Side - Progress: Progressing toward goal PT Goal: Sit to Stand - Progress:  Progressing toward goal PT Goal: Stand to Sit - Progress: Progressing toward goal PT Transfer Goal: Bed to Chair/Chair to Bed - Progress: Progressing toward goal PT Goal: Ambulate - Progress: Progressing toward goal PT Goal: Perform Home Exercise Program - Progress: Progressing toward goal  Visit Information  Last PT Received On: 08/19/11 Assistance Needed: +1    Subjective Data  Subjective: My feet hurt all the time from neuropathy.  And my circulation is bad."   Cognition  Overall Cognitive Status: Appears within functional limits for tasks assessed/performed Arousal/Alertness: Awake/alert Orientation Level: Appears intact for tasks assessed Behavior During Session: [facility] for tasks performed    Balance     End of Session PT - End of Session Equipment Utilized During Treatment: Gait belt Activity Tolerance: Patient tolerated treatment well Patient left: in bed;with call bell/phone within reach Nurse Communication: Mobility status   GP     Vena Austria 08/19/2011, 2:07 PM Durenda Hurt. Renaldo Fiddler, [facility] Acute Rehab Services Pager [phone]

## 2011-08-19 NOTE — Progress Notes (Signed)
Occupational Therapy Treatment Patient Details Name: [name] MRN: [id number] DOB: 07-Sep-1934 Today's Date: 08/19/2011 Time: 7829-5621 OT Time Calculation (min): 32 min  OT Assessment / Plan / Recommendation Comments on Treatment Session Pt. progressing well and with improved activity tolerance with ADLs    Follow Up Recommendations  Skilled nursing facility       Equipment Recommendations  Defer to next venue       Frequency Min 2X/week   Plan Discharge plan remains appropriate    Precautions / Restrictions Precautions Precautions: Fall;Anterior Hip Restrictions Weight Bearing Restrictions: Yes RLE Weight Bearing: Weight bearing as tolerated   Pertinent Vitals/Pain 8/ 10 in rt. Hip and bil feet    ADL  Grooming: Performed;Wash/dry face;Set up;Min guard Where Assessed - Grooming: Unsupported standing Upper Body Dressing: Performed;Set up (don gown) Where Assessed - Upper Body Dressing: Supported sitting Toilet Transfer: Simulated;Minimal assistance Toilet Transfer Method: Sit to stand;Stand pivot Acupuncturist:  Nurse, children's) Equipment Used: Rolling walker Transfers/Ambulation Related to ADLs: ~50' with RW and mod verbal cues for hand placement and technique ADL Comments: Pt. educated on techniques for completing LB ADLs due to increased pain causing limitations. Pt. educated on WBAT and also introduced to concept of using AE to complete AE.      OT Goals Acute Rehab OT Goals OT Goal Formulation: With patient Time For Goal Achievement: 08/24/11 Potential to Achieve Goals: Good ADL Goals Pt Will Perform Grooming: with set-up;with supervision;Standing at sink ADL Goal: Grooming - Progress: Progressing toward goals Pt Will Transfer to Toilet: with supervision;with DME;Regular height toilet;Ambulation ADL Goal: Toilet Transfer - Progress: Progressing toward goals Additional ADL Goal #1: Pt. will complete bed mobility with supervision in prep for BADLs ADL  Goal: Additional Goal #1 - Progress: Met  Visit Information  Last OT Received On: 08/19/11 Assistance Needed: +1          Cognition  Overall Cognitive Status: Appears within functional limits for tasks assessed/performed Arousal/Alertness: Awake/alert Orientation Level: Appears intact for tasks assessed Behavior During Session: [facility] for tasks performed    Mobility Bed Mobility Supine to Sit: 5: Supervision Sitting - Scoot to Edge of Bed: 5: Supervision Transfers Transfers: Sit to Stand;Stand to Sit Sit to Stand: With upper extremity assist;From bed;4: Min assist Stand to Sit: 4: Min guard;With upper extremity assist;To chair/3-in-1 Details for Transfer Assistance: Min verbal cues for hand placement         End of Session OT - End of Session Equipment Utilized During Treatment: Gait belt Activity Tolerance: Patient tolerated treatment well Patient left: in chair;with call bell/phone within reach Nurse Communication: Mobility status       Jayland Null, OTR/L Pager [phone] 08/19/2011, 12:03 PM

## 2011-08-19 NOTE — Discharge Summary (Signed)
Physician Discharge Summary  Patient ID: RENO CLASBY MRN: [id number] DOB/AGE: Mar 23, 1934 77 y.o.  Admit date: 08/14/2011 Discharge date: 08/19/2011  Primary Care Physician:  Thora Lance, MD  Discharge Diagnoses:    .Syncope .Fall with right femur fracture and right second rib fracture  . right Femur fracture status post total hip arthroplasty on 08/16/2011  .Thrombocytopenia .COPD (chronic obstructive pulmonary disease) .PVD (peripheral vascular disease) .Rheumatoid arthritis Coagulopathy secondary to Coumadin, improved after vitamin K Hypokalemia: Replaced  Consults:  Orthopedics, Dr. Doneen Poisson   Discharge Medications: Medication List  As of 08/19/2011  1:37 PM   STOP taking these medications         sildenafil 100 MG tablet      VITAMIN B-12 IJ         TAKE these medications         acetaminophen 500 MG tablet   Commonly known as: TYLENOL   Take 1 tablet (500 mg total) by mouth 3 (three) times daily.      ADVAIR DISKUS 250-50 MCG/DOSE Aepb   Generic drug: Fluticasone-Salmeterol   Inhale 1 puff into the lungs every 12 (twelve) hours.      ALPRAZolam 1 MG tablet   Commonly known as: XANAX   Take 1 tablet (1 mg total) by mouth at bedtime as needed for sleep or anxiety. For anxiety/sleep.      aspirin 325 MG tablet   Take 1 tablet (325 mg total) by mouth 2 (two) times daily.      bisacodyl 10 MG suppository   Commonly known as: DULCOLAX   Place 1 suppository (10 mg total) rectally daily as needed.      diphenhydrAMINE 12.5 MG/5ML elixir   Commonly known as: BENADRYL   Take 5-10 mLs (12.5-25 mg total) by mouth every 4 (four) hours as needed for itching.      DSS 100 MG Caps   Take 100 mg by mouth 2 (two) times daily.      feeding supplement Liqd   Take 237 mLs by mouth 2 (two) times daily between meals.      gabapentin 300 MG capsule   Commonly known as: NEURONTIN   Take 1 capsule (300 mg total) by mouth 3 (three) times daily.     methocarbamol 750 MG tablet   Commonly known as: ROBAXIN   Take 1 tablet (750 mg total) by mouth 4 (four) times daily.      multivitamin tablet   Take 1 tablet by mouth daily.      oxyCODONE 5 MG immediate release tablet   Commonly known as: Oxy IR/ROXICODONE   Take 1 tablet (5 mg total) by mouth every 4 (four) hours as needed (for breakthrough pain).      oxyCODONE-acetaminophen 5-325 MG per tablet   Commonly known as: PERCOCET   Take 1 tablet by mouth every 6 (six) hours. For pain.      rosuvastatin 20 MG tablet   Commonly known as: CRESTOR   Take 20 mg by mouth daily.             Brief H and P: For complete details please refer to admission H and P, but in brief patient is a 77 year old male with history of COPD, peripheral vascular disease, rheumatoid arthritis presented to ED after a fall on 08/14/2011. Patient reported that he got up from the bed, tripped and fell but also felt dizzy and passed out for a few minutes. Patient stated that he was on the floor  for 4 hours before he called for help. On arrival to ED, he was found to have right femur neck fracture and also was consulted. Patient also has a history of significant basilar problem in his both lower extremities with severely impaired circulation.  Hospital Course:  Patient was admitted with mechanical fall as well as syncopal episode. Cardiac enzymes remained negative. A 2-D echocardiogram was done which showed EF of 50-55%, grade 1 diastolic dysfunction, no regional wall motion abnormalities. Patient was found to have right femur neck fracture and orthopedics was consulted, patient underwent a total right hip replacement surgery and on 08/16/2011. He was followed closely by orthopedic service and per my discussion with Dr. Magnus Ivan today, the patient is okay for discharge and Dr. Magnus Ivan has placed discharge instructions for the patient. Patient is doing well with physical therapy and requires skilled nursing  facility/STR. He will have follow up appointment with Dr. Magnus Ivan in 2 weeks.  Right femur neck fracture: Status post total hip replacement surgery, post op day 3. Patient is on scheduled Tylenol, Robaxin, Neurontin, Percocet and PRN oxycodone for pain. The pain medications should be weaned per patient's tolerance and improvement in his pain. He was placed on Coumadin for DVT prophylaxis however patient's the INR became quickly supratherapeutic at 5.77, requiring vitamin K to reverse the effect. Coumadin was discontinued by orthopedics and recommended to place the patient on aspirin BID for next 2 weeks. COPD: Stable, No exacerbation, patient was continued on advair and nebs as needed.   Chest tenderness likely secondary to right second rib fracture on the chest x-ray: Continue pain control and incentive spirometry.   Anemia: Hemoglobin slightly trended down. However remained stable at 8.4.  PVD: Severe disease, affecting the lower extremities. No femoral pulse on the right side. Outpatient follow up with Vascular surgery to be arranged by Dr Manus Gunning and Dr Magnus Ivan.       Day of Discharge BP 97/40  Pulse 83  Temp 98.3 F (36.8 C) (Oral)  Resp 18  Ht 5\' 8"  (1.727 m)  Wt 57.788 kg (127 lb 6.4 oz)  BMI 19.37 kg/m2  SpO2 98%  Physical Exam: General: Alert and awake oriented x3 not in any acute distress. HEENT: anicteric sclera, pupils reactive to light and accommodation CVS: S1-S2 clear no murmur rubs or gallops Chest: clear to auscultation bilaterally, no wheezing rales or rhonchi Abdomen: soft nontender, nondistended, normal bowel sounds, no organomegaly Extremities: no cyanosis, clubbing or edema noted bilaterally Neuro: Cranial nerves II-XII intact, no focal neurological deficits   The results of significant diagnostics from this hospitalization (including imaging, microbiology, ancillary and laboratory) are listed below for reference.    LAB RESULTS: Basic Metabolic Panel:  Lab  08/19/11 0510 08/18/11 0527  NA 136 135  K 3.3* 4.5  CL 99 98  CO2 29 27  GLUCOSE 110* 105*  BUN 13 15  CREATININE 0.74 0.82  CALCIUM 8.7 8.7  MG -- --  PHOS -- --   Liver Function Tests:  Lab 08/15/11 0541  AST 18  ALT 9  ALKPHOS 82  BILITOT 0.7  PROT 6.4  ALBUMIN 3.2*     Lab 08/19/11 0510 08/18/11 0527 08/14/11 1330  WBC 6.3 6.1 --  NEUTROABS -- -- 7.9*  HGB 8.4* 8.4* --  HCT 23.9* 24.2* --  MCV 92.6 -- --  PLT 145* 132* --   Cardiac Enzymes:  Lab 08/14/11 1330  CKTOTAL 59  CKMB 2.5  CKMBINDEX --  TROPONINI <0.30   Significant  Diagnostic Studies:  Dg Chest 1 View  08/14/2011  *RADIOLOGY REPORT*  Clinical Data: Fall.  Right hip pain.  CHEST - 1 VIEW  Comparison: 12/01/2009  Findings: Atherosclerotic aorta noted with chronic scarring along the left lateral hemidiaphragm.  Prominent emphysema noted.  A right posterolateral 2nd rib fracture may be acute - correlate with tenderness in this vicinity.  The right anterolateral ninth rib fracture is more likely old given the appearance of callus.  No pneumothorax observed.  IMPRESSION:  1.  Discontinuous right second rib, possibly acute fracture - correlate with tenderness in this vicinity. 2.  Emphysema. 3.  Chronic scarring along the left lateral costophrenic angle.  Original Report Authenticated By: Dellia Cloud, M.D.   Dg Hip Complete Right  08/14/2011  *RADIOLOGY REPORT*  Clinical Data: Fall.  Right hip pain.  RIGHT HIP - COMPLETE 2+ VIEW  Comparison: 04/26/2011  Findings: Acute right femoral neck fracture noted, with a small fragment along the medial side of the fracture, and equivocal varus angulation.  Spurring from the anterior inferior iliac spines noted.  A left common iliac artery stent is present. Old, healed fracture of the left inferior pubic ramus observed.  IMPRESSION:  1.  Acute right femoral neck fracture.  Original Report Authenticated By: Dellia Cloud, M.D.   Dg Femur Right  08/14/2011   *RADIOLOGY REPORT*  Clinical Data: Fall onto right side.  Right hip pain.  RIGHT FEMUR - 2 VIEW  Comparison: None.  Findings: Right femoral neck fracture noted. No other fracture is observed.  Bridging bony fusion noted between the femur and tibia, and femur and patella.  Atherosclerosis noted.  IMPRESSION:  1.  Acute right femoral neck fracture. 2.  Prior knee arthrodesis.  Original Report Authenticated By: Dellia Cloud, M.D.   Ct Head Wo Contrast  08/14/2011  *RADIOLOGY REPORT*  Clinical Data:  FALL EXTREMITY PAIN,fall.  CT HEAD WITHOUT CONTRAST CT CERVICAL SPINE WITHOUT CONTRAST  Technique:  Multidetector CT imaging of the head and cervical spine was performed following the standard protocol without IV contrast. Multiplanar CT image reconstructions of the cervical spine were also generated.  Comparison: None  CT HEAD  Findings: Atherosclerotic and physiologic intracranial calcifications.  Mild atrophy. There is no evidence of acute intracranial hemorrhage, brain edema, mass lesion, acute infarction,   mass effect, or midline shift. Acute infarct may be inapparent on noncontrast CT.  No other intra-axial abnormalities are seen, and the ventricles and sulci are within normal limits in size and symmetry.   No abnormal extra-axial fluid collections or masses are identified.  No significant calvarial abnormality.  IMPRESSION: 1.  Negative CT head.  CT CERVICAL SPINE  Findings: Mild reversal of the normal cervical lordosis. There is a central protrusion C2-3.  Solid appearing fusion across the C3-4 interspace.  Previous instrumented ACDF C4-5, hardware intact without surrounding lucency.  There is persistent lucency across the interspace however.  There is solid appearing interbody fusion C5-6 and C6-7, as well as fusion across the posterior spinous processes of C5-6.  Negative for fracture.  No prevertebral soft tissue swelling.  Emphysematous changes in the visualized lung apices.  Calcified right carotid  bifurcation plaque.  IMPRESSION:  1.  Negative for fracture or other acute bony abnormality. 2.  Postoperative changes C3-C7 as above. 3. Loss of the normal cervical spine lordosis, which may be secondary to positioning, spasm, or soft tissue injury. 4.  Right carotid bifurcation plaque.  Original Report Authenticated By: Thora Lance III,  M.D.   Ct Cervical Spine Wo Contrast  08/14/2011  *RADIOLOGY REPORT*  Clinical Data:  FALL EXTREMITY PAIN,fall.  CT HEAD WITHOUT CONTRAST CT CERVICAL SPINE WITHOUT CONTRAST  Technique:  Multidetector CT imaging of the head and cervical spine was performed following the standard protocol without IV contrast. Multiplanar CT image reconstructions of the cervical spine were also generated.  Comparison: None  CT HEAD  Findings: Atherosclerotic and physiologic intracranial calcifications.  Mild atrophy. There is no evidence of acute intracranial hemorrhage, brain edema, mass lesion, acute infarction,   mass effect, or midline shift. Acute infarct may be inapparent on noncontrast CT.  No other intra-axial abnormalities are seen, and the ventricles and sulci are within normal limits in size and symmetry.   No abnormal extra-axial fluid collections or masses are identified.  No significant calvarial abnormality.  IMPRESSION: 1.  Negative CT head.  CT CERVICAL SPINE  Findings: Mild reversal of the normal cervical lordosis. There is a central protrusion C2-3.  Solid appearing fusion across the C3-4 interspace.  Previous instrumented ACDF C4-5, hardware intact without surrounding lucency.  There is persistent lucency across the interspace however.  There is solid appearing interbody fusion C5-6 and C6-7, as well as fusion across the posterior spinous processes of C5-6.  Negative for fracture.  No prevertebral soft tissue swelling.  Emphysematous changes in the visualized lung apices.  Calcified right carotid bifurcation plaque.  IMPRESSION:  1.  Negative for fracture or other acute  bony abnormality. 2.  Postoperative changes C3-C7 as above. 3. Loss of the normal cervical spine lordosis, which may be secondary to positioning, spasm, or soft tissue injury. 4.  Right carotid bifurcation plaque.  Original Report Authenticated By: Osa Craver, M.D.   Ct Hip Right Wo Contrast  08/14/2011  *RADIOLOGY REPORT*  Clinical Data: Further evaluation of normal hip fracture.  CT OF THE RIGHT HIP WITHOUT CONTRAST  Technique:  Multidetector CT imaging was performed according to the standard protocol. Multiplanar CT image reconstructions were also generated.  Comparison: Plain films 08/13/2008  Findings: Again seen is the fracture is within the right femoral neck.  There is mild angulation, apex anteriorly.  Mild impaction along the posterior aspect of the fracture.  No subluxation or dislocation.  IMPRESSION: Right femoral neck fracture with apex anterior angulation and posterior impaction.  Original Report Authenticated By: Cyndie Chime, M.D.     Disposition and Follow-up: Discharge Orders    Future Orders Please Complete By Expires   Diet - low sodium heart healthy      Increase activity slowly          DISPOSITION: Skilled nursing facility DIET: Heart healthy diet ACTIVITY: With physical therapy  DISCHARGE FOLLOW-UP Follow-up Information    Follow up with Thora Lance, MD. Schedule an appointment as soon as possible for a visit in 2 weeks.   Contact information:   [address] [place] [address] [phone]       Follow up with Kathryne Hitch, MD. Schedule an appointment as soon as possible for a visit in 2 weeks. (please see instructions below)    Contact information:   [facility] [address] [place] [address] [phone]          Time spent on Discharge: 45 mins  Signed:   Guerry Covington M.D. [facility] 08/19/2011, 1:37 PM Pager: [phone]  If  7PM-7AM, please contact night-coverage [email or web address] Password [email or web address]

## 2011-08-22 MED FILL — Perflutren Lipid Microsphere IV Susp 6.52 MG/ML: INTRAVENOUS | Qty: 2 | Status: AC

## 2012-01-26 ENCOUNTER — Emergency Department (HOSPITAL_COMMUNITY)
Admission: EM | Admit: 2012-01-26 | Discharge: 2012-01-26 | Disposition: A | Discharge status: Home Health | Payer: Medicare Other | Attending: Emergency Medicine | Admitting: Emergency Medicine

## 2012-01-26 ENCOUNTER — Emergency Department (HOSPITAL_COMMUNITY): Payer: Medicare Other

## 2012-01-26 ENCOUNTER — Encounter (HOSPITAL_COMMUNITY): Payer: Self-pay | Admitting: Emergency Medicine

## 2012-01-26 DIAGNOSIS — Z860100 Personal history of colon polyps, unspecified: Secondary | ICD-10-CM | POA: Insufficient documentation

## 2012-01-26 DIAGNOSIS — F411 Generalized anxiety disorder: Secondary | ICD-10-CM | POA: Insufficient documentation

## 2012-01-26 DIAGNOSIS — R1013 Epigastric pain: Secondary | ICD-10-CM | POA: Insufficient documentation

## 2012-01-26 DIAGNOSIS — Z8546 Personal history of malignant neoplasm of prostate: Secondary | ICD-10-CM | POA: Insufficient documentation

## 2012-01-26 DIAGNOSIS — K8689 Other specified diseases of pancreas: Secondary | ICD-10-CM | POA: Insufficient documentation

## 2012-01-26 DIAGNOSIS — R059 Cough, unspecified: Secondary | ICD-10-CM | POA: Insufficient documentation

## 2012-01-26 DIAGNOSIS — K838 Other specified diseases of biliary tract: Secondary | ICD-10-CM

## 2012-01-26 DIAGNOSIS — G8929 Other chronic pain: Secondary | ICD-10-CM | POA: Insufficient documentation

## 2012-01-26 DIAGNOSIS — Z87448 Personal history of other diseases of urinary system: Secondary | ICD-10-CM | POA: Insufficient documentation

## 2012-01-26 DIAGNOSIS — Z8669 Personal history of other diseases of the nervous system and sense organs: Secondary | ICD-10-CM | POA: Insufficient documentation

## 2012-01-26 DIAGNOSIS — R0989 Other specified symptoms and signs involving the circulatory and respiratory systems: Secondary | ICD-10-CM | POA: Insufficient documentation

## 2012-01-26 DIAGNOSIS — Z8679 Personal history of other diseases of the circulatory system: Secondary | ICD-10-CM | POA: Insufficient documentation

## 2012-01-26 DIAGNOSIS — E785 Hyperlipidemia, unspecified: Secondary | ICD-10-CM | POA: Insufficient documentation

## 2012-01-26 DIAGNOSIS — Z862 Personal history of diseases of the blood and blood-forming organs and certain disorders involving the immune mechanism: Secondary | ICD-10-CM | POA: Insufficient documentation

## 2012-01-26 DIAGNOSIS — F17200 Nicotine dependence, unspecified, uncomplicated: Secondary | ICD-10-CM | POA: Insufficient documentation

## 2012-01-26 DIAGNOSIS — J441 Chronic obstructive pulmonary disease with (acute) exacerbation: Secondary | ICD-10-CM | POA: Insufficient documentation

## 2012-01-26 DIAGNOSIS — Z8739 Personal history of other diseases of the musculoskeletal system and connective tissue: Secondary | ICD-10-CM | POA: Insufficient documentation

## 2012-01-26 DIAGNOSIS — R109 Unspecified abdominal pain: Secondary | ICD-10-CM

## 2012-01-26 DIAGNOSIS — R05 Cough: Secondary | ICD-10-CM | POA: Insufficient documentation

## 2012-01-26 DIAGNOSIS — R0609 Other forms of dyspnea: Secondary | ICD-10-CM | POA: Insufficient documentation

## 2012-01-26 DIAGNOSIS — R110 Nausea: Secondary | ICD-10-CM | POA: Insufficient documentation

## 2012-01-26 DIAGNOSIS — Z79899 Other long term (current) drug therapy: Secondary | ICD-10-CM | POA: Insufficient documentation

## 2012-01-26 HISTORY — DX: Essential (primary) hypertension: I10

## 2012-01-26 LAB — COMPREHENSIVE METABOLIC PANEL
ALT: 17 U/L (ref 0–53)
AST: 26 U/L (ref 0–37)
Albumin: 4.2 g/dL (ref 3.5–5.2)
Alkaline Phosphatase: 100 U/L (ref 39–117)
BUN: 25 mg/dL — ABNORMAL HIGH (ref 6–23)
CO2: 28 mEq/L (ref 19–32)
Calcium: 10.6 mg/dL — ABNORMAL HIGH (ref 8.4–10.5)
Chloride: 95 mEq/L — ABNORMAL LOW (ref 96–112)
Creatinine, Ser: 1.21 mg/dL (ref 0.50–1.35)
GFR calc Af Amer: 66 mL/min — ABNORMAL LOW (ref >90)
GFR calc non Af Amer: 57 mL/min — ABNORMAL LOW (ref >90)
Glucose, Bld: 109 mg/dL — ABNORMAL HIGH (ref 70–99)
Potassium: 5.1 mEq/L (ref 3.5–5.1)
Sodium: 135 mEq/L (ref 135–145)
Total Bilirubin: 0.4 mg/dL (ref 0.3–1.2)
Total Protein: 7.8 g/dL (ref 6.0–8.3)

## 2012-01-26 LAB — CBC WITH DIFFERENTIAL/PLATELET
Basophils Absolute: 0 10*3/uL (ref 0.0–0.1)
Basophils Relative: 0 % (ref 0–1)
Eosinophils Absolute: 0 10*3/uL (ref 0.0–0.7)
Eosinophils Relative: 0 % (ref 0–5)
HCT: 43.6 % (ref 39.0–52.0)
Hemoglobin: 15.2 g/dL (ref 13.0–17.0)
Lymphocytes Relative: 11 % — ABNORMAL LOW (ref 12–46)
Lymphs Abs: 0.9 10*3/uL (ref 0.7–4.0)
MCH: 31.4 pg (ref 26.0–34.0)
MCHC: 34.9 g/dL (ref 30.0–36.0)
MCV: 90.1 fL (ref 78.0–100.0)
Monocytes Absolute: 0.4 10*3/uL (ref 0.1–1.0)
Monocytes Relative: 5 % (ref 3–12)
Neutro Abs: 6.7 10*3/uL (ref 1.7–7.7)
Neutrophils Relative %: 84 % — ABNORMAL HIGH (ref 43–77)
Platelets: 198 10*3/uL (ref 150–400)
RBC: 4.84 MIL/uL (ref 4.22–5.81)
RDW: 15.3 % (ref 11.5–15.5)
WBC: 7.9 10*3/uL (ref 4.0–10.5)

## 2012-01-26 LAB — LIPASE, BLOOD: Lipase: 16 U/L (ref 11–59)

## 2012-01-26 MED ORDER — IOHEXOL 300 MG/ML  SOLN
20.0000 mL | INTRAMUSCULAR | Status: AC
  Administered 2012-01-26 (×2): 20 mL via ORAL

## 2012-01-26 MED ORDER — HYDROMORPHONE HCL PF 1 MG/ML IJ SOLN
1.0000 mg | Freq: Once | INTRAMUSCULAR | Status: AC
  Administered 2012-01-26: 1 mg via INTRAVENOUS
  Filled 2012-01-26: qty 1

## 2012-01-26 MED ORDER — FAMOTIDINE 20 MG PO TABS: 20.0000 mg | ORAL_TABLET | Freq: Two times a day (BID) | ORAL | Status: DC

## 2012-01-26 MED ORDER — IPRATROPIUM BROMIDE 0.02 % IN SOLN
0.5000 mg | Freq: Once | RESPIRATORY_TRACT | Status: AC
  Administered 2012-01-26: 0.5 mg via RESPIRATORY_TRACT
  Filled 2012-01-26: qty 2.5

## 2012-01-26 MED ORDER — PREDNISONE 20 MG PO TABS: 40.0000 mg | ORAL_TABLET | Freq: Every day | ORAL | Status: DC

## 2012-01-26 MED ORDER — ALBUTEROL SULFATE (5 MG/ML) 0.5% IN NEBU
15.0000 mg | INHALATION_SOLUTION | Freq: Once | RESPIRATORY_TRACT | Status: AC
  Administered 2012-01-26: 15 mg via RESPIRATORY_TRACT
  Filled 2012-01-26: qty 0.5
  Filled 2012-01-26: qty 3

## 2012-01-26 MED ORDER — SODIUM CHLORIDE 0.9 % IV BOLUS (SEPSIS)
1000.0000 mL | Freq: Once | INTRAVENOUS | Status: AC
  Administered 2012-01-26: 1000 mL via INTRAVENOUS

## 2012-01-26 MED ORDER — IOHEXOL 300 MG/ML  SOLN
20.0000 mL | INTRAMUSCULAR | Status: AC
  Administered 2012-01-26: 20 mL via ORAL

## 2012-01-26 MED ORDER — PREDNISONE 20 MG PO TABS
60.0000 mg | ORAL_TABLET | Freq: Once | ORAL | Status: AC
  Administered 2012-01-26: 60 mg via ORAL
  Filled 2012-01-26: qty 3

## 2012-01-26 MED ORDER — GI COCKTAIL ~~LOC~~
30.0000 mL | Freq: Once | ORAL | Status: AC
  Administered 2012-01-26: 30 mL via ORAL
  Filled 2012-01-26: qty 30

## 2012-01-26 MED ORDER — IOHEXOL 300 MG/ML  SOLN
100.0000 mL | Freq: Once | INTRAMUSCULAR | Status: AC | PRN
  Administered 2012-01-26: 100 mL via INTRAVENOUS

## 2012-01-26 NOTE — ED Notes (Signed)
RT coming for hour long neb trt.  Awaiting albuterol from pharmacy.

## 2012-01-26 NOTE — ED Provider Notes (Signed)
History    77 year old male with dyspnea. Progressively worsening over the period of the past 2 weeks. Patient has a history of COPD. Chronic cough without acute change. Fevers or chills. No unusual leg pain or swelling. Denies or vomiting. No palpitations or diaphoresis. No orthopnea. Patient is also complaining of epigastric pain. Sitting on her period past several days. Associated with nausea. Does not radiate. No appreciable exacerbating relieving factors. Denies history of alcohol intake. CSN: [id number]  Arrival date & time 01/26/12  1659   First MD Initiated Contact with Patient 01/26/12 1728      Chief Complaint  Patient presents with  . Shortness of Breath  . Cough    (Consider location/radiation/quality/duration/timing/severity/associated sxs/prior treatment) HPI  Past Medical History  Diagnosis Date  . Hyperlipidemia   . Chronic back pain   . History of colon polyps   . Spinal stenosis   . Cervical spondylosis   . Cancer 2004    prostate  . Peripheral neuropathy   . Anxiety   . COPD (chronic obstructive pulmonary disease)   . BPH (benign prostatic hypertrophy)   . ED (erectile dysfunction)   . Pernicious anemia   . Peripheral vascular disease   . Rheumatoid arthritis   . Osteoporosis     Past Surgical History  Procedure Date  . Basal cell carcinoma excision     Nose x 3  . Spine surgery     Cervical Spine X 4 levels  . Cholecystectomy   . Wrist fusion     Right Wrist  . Hemorroidectomy   . Knee surgery     X 7 surgeries  . Iliac artery stent 12-07-09    Stent done by Dr. Allyson Sabal  . Total hip arthroplasty 08/16/2011    Procedure: TOTAL HIP ARTHROPLASTY ANTERIOR APPROACH;  Surgeon: Kathryne Hitch, MD;  Location: [facility] OR;  Service: Orthopedics;  Laterality: Right;  Right total hip replacement    Family History  Problem Relation Age of Onset  . Hypertension Mother   . Cancer Mother     colon cancer    History  Substance Use Topics  . Smoking  status: Current Every Day Smoker -- 1.0 packs/day  . Smokeless tobacco: Not on file  . Alcohol Use: No      Review of Systems  Allergies  Lipitor and Tiotropium bromide monohydrate  Home Medications   Current Outpatient Rx  Name  Route  Sig  Dispense  Refill  . ALPRAZOLAM 1 MG PO TABS   Oral   Take 1 tablet (1 mg total) by mouth at bedtime as needed for sleep or anxiety. For anxiety/sleep.   30 tablet   0   . ENSURE COMPLETE PO LIQD   Oral   Take 237 mLs by mouth 2 (two) times daily between meals.         Marland Kitchen FLUTICASONE-SALMETEROL 250-50 MCG/DOSE IN AEPB   Inhalation   Inhale 1 puff into the lungs every 12 (twelve) hours.         Marland Kitchen ONE-DAILY MULTI VITAMINS PO TABS   Oral   Take 1 tablet by mouth daily.         . OXYCODONE-ACETAMINOPHEN 5-325 MG PO TABS   Oral   Take 1 tablet by mouth every 6 (six) hours. For pain.   30 tablet   0   . ROSUVASTATIN CALCIUM 20 MG PO TABS   Oral   Take 20 mg by mouth daily.  BP 134/82  Pulse 90  Temp 98.2 F (36.8 C) (Oral)  Resp 18  SpO2 100%  Physical Exam  Nursing note and vitals reviewed. Constitutional: He appears well-developed and well-nourished. No distress.  HENT:  Head: Normocephalic and atraumatic.  Eyes: Conjunctivae normal are normal. Pupils are equal, round, and reactive to light. Right eye exhibits no discharge. Left eye exhibits no discharge.  Neck: Neck supple.  Cardiovascular: Normal rate, regular rhythm and normal heart sounds.  Exam reveals no gallop and no friction rub.   No murmur heard. Pulmonary/Chest: Effort normal and breath sounds normal. No respiratory distress.       Expiratory wheezing bilaterally. Prolonged expiratory phase. Speaking in full sentences.  Abdominal: Soft. He exhibits no distension. There is no tenderness.  Musculoskeletal: He exhibits no edema and no tenderness.       Lower extremities symmetric as compared to each other. No calf tenderness. Negative Homan's.  No palpable cords.   Neurological: He is alert.  Skin: Skin is warm and dry. He is not diaphoretic.  Psychiatric: He has a normal mood and affect. His behavior is normal. Thought content normal.    ED Course  Procedures (including critical care time)  Labs Reviewed  CBC WITH DIFFERENTIAL - Abnormal; Notable for the following:    Neutrophils Relative 84 (*)     Lymphocytes Relative 11 (*)     All other components within normal limits  COMPREHENSIVE METABOLIC PANEL - Abnormal; Notable for the following:    Chloride 95 (*)     Glucose, Bld 109 (*)     BUN 25 (*)     Calcium 10.6 (*)     GFR calc non Af Amer 57 (*)     GFR calc Af Amer 66 (*)     All other components within normal limits  LIPASE, BLOOD   Dg Chest 2 View  01/26/2012  *RADIOLOGY REPORT*  Clinical Data: Shortness of breath.  CHEST - 2 VIEW  Comparison: Plain films of the chest 08/14/2011.  Findings: The lungs are severely emphysematous.  No focal airspace disease or evidence of pulmonary edema is identified.  There is no pneumothorax or pleural fluid.  Heart size normal.  IMPRESSION: Severe emphysema without acute disease.   Original Report Authenticated By: Holley Dexter, M.D.    Ct Abdomen Pelvis W Contrast  01/26/2012  *RADIOLOGY REPORT*  Clinical Data: Short of breath.  Cough.  CT ABDOMEN AND PELVIS WITH CONTRAST  Technique:  Multidetector CT imaging of the abdomen and pelvis was performed following the standard protocol during bolus administration of intravenous contrast.  Contrast: OMNIPAQUE IOHEXOL 300 MG/ML  SOLN  Comparison: CT 11/08/2007.  Findings: Lung Bases: Attenuation with Bosselation of the hemidiaphragms is present posteriorly.  Emphysema.  4 mm pulmonary nodule is present in the right lower lobe (image number 10 series 3).  Mild subpleural fibrosis in the left lower lobe.  Liver:  Intra and extrahepatic biliary ductal dilation is present. There is a small low attenuation lesion in the right hepatic  lobe (image number 22 series 2) that probably represents a tiny cyst. There is no mass lesion identified.  Spleen:  Normal.  Gallbladder:  Cholecystectomy.  Common bile duct:  Massive dilation of the common bile duct, measuring up to 19 mm.  No radiopaque retained stones identified. There is a soft tissue density structure near the ampulla.  This could represent a small neoplasm or partially calcified stone. Correlation with bilirubin is recommended.  Pancreas:  Mild pancreatic ductal dilation.  No pancreatic mass lesion.  Adrenal glands:  Bilateral adrenal nodules appears similar to the comparison exam.  Kidneys:  Normal enhancement and excretion of contrast.  Tiny low density lesions probably represent small cysts but are too small to characterize.  4 mm calculus in the left interpolar collecting system.  Ureters appear within normal limits.  No ureteral calculi.  Stomach:  Within normal limits.  Small bowel:  Normal.  No mesenteric adenopathy.  No obstruction or inflammatory changes.  Colon:   Normal appendix.  Ascending colon appears normal. Transverse colon normal.  Splenic flexure decompressed. Rectosigmoid appears within normal limits. Sigmoid diverticulosis. Small perirectal rim enhancing fluid collection with gas is present (image number 88 series 5) which could represent a small fistula/diverticulum or perirectal abscess.  Clinically correlate. Abscess was identified in this region on prior examination and the lesion is much smaller than on the prior study.  Pelvic Genitourinary:  Hysterectomy.  Scatter artifact is present from right total hip arthroplasty.  Bones:  No aggressive osseous lesions.  Healed left obturator ring fractures.  Osteopenia.  Vasculature: Atherosclerosis.  Aortic mural plaque is present. There is a calcified penetrating atherosclerotic ulcer in the right side of the infrarenal abdominal aorta (image 31 series 2).  No acute aortic findings.  There is thrombosis of the right common  iliac artery with reconstitution at the right common iliac bifurcation.  IMPRESSION: 1.  4 mm right lower lobe pulmonary nodule. If the patient is at high risk for bronchogenic carcinoma, follow-up chest CT at 1 year is recommended.  If the patient is at low risk, no follow-up is needed.  This recommendation follows the consensus statement: Guidelines for Management of Small Pulmonary Nodules Detected on CT Scans:  A Statement from the Fleischner Society as published in Radiology 2005; 237:395-400.  Emphysema. 2.  Cholecystectomy with progressive intra and extrahepatic biliary ductal dilation. Small peri ampullary soft tissue density lesion which may represent a small partially calcified stones or small obstructing neoplasm.  Correlation with bilirubin recommended. Consider follow-up MRCP or ERCP. Non-emergent MRI should be deferred until patient has been discharged for the acute illness, and can optimally cooperate with positioning and breath-holding instructions. 3.  Nonobstructive 4 mm left interpolar renal collecting system calculus.  Probable small bilateral renal cysts. 4.  Severe atherosclerosis.  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 5. Stable benign adrenal nodules consistent with adenoma.   Original Report Authenticated By: Andreas Newport, M.D.      1. COPD exacerbation   2. Abdominal pain   3. Mass of ampulla of Vater       MDM  77 year old male with dyspnea. Suspect COPD exacerbation. Patient is in no respiratory distress on exam. Oxygen saturations are good on room air after hour-long nebulized treatments and steroids. Please see for discharge at this time. We'll discharge the continued steroids. Evaluation with abdominal pain patient is CT abdomen pelvis. This is significant for biliary ductal dilation with a soft tissue mass in ampulla. LFTs/bilirubin are normal. The patient's pain is controlled. Is not having vomiting. Is not peritoneal. This is in need of followup, although does  not appear that he needs emergent ERCP or MRCP. Patient is being discharged with gastroenterology followup. I impressed upon patient the importance of following up in a timely manner. Also discussed the incidental pulmonary nodule findings.        Raeford Razor, MD 01/30/12 1515

## 2012-01-26 NOTE — ED Notes (Signed)
Pt c/o increased SOB and cough with pain with cough; pt sent here by PCP for COPD exac

## 2012-01-31 ENCOUNTER — Encounter: Payer: Self-pay | Admitting: Internal Medicine

## 2012-01-31 ENCOUNTER — Inpatient Hospital Stay (HOSPITAL_COMMUNITY)
Admission: AD | Admit: 2012-01-31 | Discharge: 2012-02-06 | DRG: 191 | Disposition: A | Discharge status: Skilled Nursing Facility | Payer: Medicare Other | Source: Ambulatory Visit | Attending: Internal Medicine | Admitting: Internal Medicine

## 2012-01-31 ENCOUNTER — Ambulatory Visit (INDEPENDENT_AMBULATORY_CARE_PROVIDER_SITE_OTHER): Payer: Medicare Other | Admitting: Internal Medicine

## 2012-01-31 ENCOUNTER — Encounter (HOSPITAL_COMMUNITY): Payer: Self-pay | Admitting: *Deleted

## 2012-01-31 VITALS — BP 120/68 | HR 86 | Temp 97.8°F | Ht 68.0 in | Wt 110.0 lb

## 2012-01-31 DIAGNOSIS — D696 Thrombocytopenia, unspecified: Secondary | ICD-10-CM

## 2012-01-31 DIAGNOSIS — J449 Chronic obstructive pulmonary disease, unspecified: Secondary | ICD-10-CM

## 2012-01-31 DIAGNOSIS — N179 Acute kidney failure, unspecified: Secondary | ICD-10-CM | POA: Diagnosis not present

## 2012-01-31 DIAGNOSIS — M5137 Other intervertebral disc degeneration, lumbosacral region: Secondary | ICD-10-CM | POA: Diagnosis present

## 2012-01-31 DIAGNOSIS — R55 Syncope and collapse: Secondary | ICD-10-CM

## 2012-01-31 DIAGNOSIS — I739 Peripheral vascular disease, unspecified: Secondary | ICD-10-CM

## 2012-01-31 DIAGNOSIS — Z8546 Personal history of malignant neoplasm of prostate: Secondary | ICD-10-CM

## 2012-01-31 DIAGNOSIS — Z853 Personal history of malignant neoplasm of breast: Secondary | ICD-10-CM

## 2012-01-31 DIAGNOSIS — M069 Rheumatoid arthritis, unspecified: Secondary | ICD-10-CM

## 2012-01-31 DIAGNOSIS — J441 Chronic obstructive pulmonary disease with (acute) exacerbation: Principal | ICD-10-CM

## 2012-01-31 DIAGNOSIS — K869 Disease of pancreas, unspecified: Secondary | ICD-10-CM | POA: Diagnosis present

## 2012-01-31 DIAGNOSIS — I70299 Other atherosclerosis of native arteries of extremities, unspecified extremity: Secondary | ICD-10-CM | POA: Diagnosis present

## 2012-01-31 DIAGNOSIS — F411 Generalized anxiety disorder: Secondary | ICD-10-CM

## 2012-01-31 DIAGNOSIS — F3289 Other specified depressive episodes: Secondary | ICD-10-CM | POA: Diagnosis present

## 2012-01-31 DIAGNOSIS — I10 Essential (primary) hypertension: Secondary | ICD-10-CM | POA: Diagnosis present

## 2012-01-31 DIAGNOSIS — R634 Abnormal weight loss: Secondary | ICD-10-CM

## 2012-01-31 DIAGNOSIS — M51379 Other intervertebral disc degeneration, lumbosacral region without mention of lumbar back pain or lower extremity pain: Secondary | ICD-10-CM | POA: Diagnosis present

## 2012-01-31 DIAGNOSIS — J4489 Other specified chronic obstructive pulmonary disease: Secondary | ICD-10-CM

## 2012-01-31 DIAGNOSIS — Z860100 Personal history of colon polyps, unspecified: Secondary | ICD-10-CM

## 2012-01-31 DIAGNOSIS — Z96649 Presence of unspecified artificial hip joint: Secondary | ICD-10-CM

## 2012-01-31 DIAGNOSIS — W19XXXA Unspecified fall, initial encounter: Secondary | ICD-10-CM

## 2012-01-31 DIAGNOSIS — D510 Vitamin B12 deficiency anemia due to intrinsic factor deficiency: Secondary | ICD-10-CM | POA: Diagnosis present

## 2012-01-31 DIAGNOSIS — I7092 Chronic total occlusion of artery of the extremities: Secondary | ICD-10-CM

## 2012-01-31 DIAGNOSIS — F17200 Nicotine dependence, unspecified, uncomplicated: Secondary | ICD-10-CM | POA: Diagnosis present

## 2012-01-31 DIAGNOSIS — Z85828 Personal history of other malignant neoplasm of skin: Secondary | ICD-10-CM

## 2012-01-31 DIAGNOSIS — Z681 Body mass index (BMI) 19 or less, adult: Secondary | ICD-10-CM

## 2012-01-31 DIAGNOSIS — M47812 Spondylosis without myelopathy or radiculopathy, cervical region: Secondary | ICD-10-CM | POA: Diagnosis present

## 2012-01-31 DIAGNOSIS — G609 Hereditary and idiopathic neuropathy, unspecified: Secondary | ICD-10-CM | POA: Diagnosis present

## 2012-01-31 DIAGNOSIS — Z79899 Other long term (current) drug therapy: Secondary | ICD-10-CM

## 2012-01-31 DIAGNOSIS — R627 Adult failure to thrive: Secondary | ICD-10-CM | POA: Diagnosis present

## 2012-01-31 DIAGNOSIS — F329 Major depressive disorder, single episode, unspecified: Secondary | ICD-10-CM | POA: Diagnosis present

## 2012-01-31 DIAGNOSIS — B370 Candidal stomatitis: Secondary | ICD-10-CM | POA: Diagnosis not present

## 2012-01-31 DIAGNOSIS — M810 Age-related osteoporosis without current pathological fracture: Secondary | ICD-10-CM | POA: Diagnosis present

## 2012-01-31 DIAGNOSIS — F32A Depression, unspecified: Secondary | ICD-10-CM

## 2012-01-31 DIAGNOSIS — K8689 Other specified diseases of pancreas: Secondary | ICD-10-CM | POA: Diagnosis present

## 2012-01-31 DIAGNOSIS — R64 Cachexia: Secondary | ICD-10-CM

## 2012-01-31 DIAGNOSIS — R1010 Upper abdominal pain, unspecified: Secondary | ICD-10-CM

## 2012-01-31 DIAGNOSIS — E785 Hyperlipidemia, unspecified: Secondary | ICD-10-CM | POA: Diagnosis present

## 2012-01-31 DIAGNOSIS — S7290XA Unspecified fracture of unspecified femur, initial encounter for closed fracture: Secondary | ICD-10-CM

## 2012-01-31 DIAGNOSIS — F419 Anxiety disorder, unspecified: Secondary | ICD-10-CM

## 2012-01-31 DIAGNOSIS — G8929 Other chronic pain: Secondary | ICD-10-CM | POA: Diagnosis present

## 2012-01-31 DIAGNOSIS — N400 Enlarged prostate without lower urinary tract symptoms: Secondary | ICD-10-CM | POA: Diagnosis present

## 2012-01-31 DIAGNOSIS — IMO0002 Reserved for concepts with insufficient information to code with codable children: Secondary | ICD-10-CM

## 2012-01-31 DIAGNOSIS — E876 Hypokalemia: Secondary | ICD-10-CM | POA: Diagnosis not present

## 2012-01-31 HISTORY — DX: Unspecified osteoarthritis, unspecified site: M19.90

## 2012-01-31 HISTORY — DX: Pneumonia, unspecified organism: J18.9

## 2012-01-31 HISTORY — DX: Hemorrhage of anus and rectum: K62.5

## 2012-01-31 HISTORY — DX: Basal cell carcinoma of skin of nose: C44.311

## 2012-01-31 HISTORY — DX: Other chronic pain: G89.29

## 2012-01-31 HISTORY — DX: Other intervertebral disc degeneration, lumbosacral region: M51.37

## 2012-01-31 HISTORY — DX: Low back pain: M54.5

## 2012-01-31 HISTORY — DX: Malignant neoplasm of prostate: C61

## 2012-01-31 HISTORY — DX: Other intervertebral disc degeneration, lumbosacral region without mention of lumbar back pain or lower extremity pain: M51.379

## 2012-01-31 HISTORY — DX: Low back pain, unspecified: M54.50

## 2012-01-31 HISTORY — DX: Major depressive disorder, single episode, unspecified: F32.9

## 2012-01-31 HISTORY — DX: Depression, unspecified: F32.A

## 2012-01-31 HISTORY — DX: Malignant neoplasm of unspecified site of unspecified male breast: C50.929

## 2012-01-31 HISTORY — DX: Chest pain, unspecified: R07.9

## 2012-01-31 LAB — BLOOD GAS, ARTERIAL
Bicarbonate: 27.3 mEq/L — ABNORMAL HIGH (ref 20.0–24.0)
Drawn by: 283401
FIO2: 0.21 %
pCO2 arterial: 41.1 mmHg (ref 35.0–45.0)
pH, Arterial: 7.438 (ref 7.350–7.450)
pO2, Arterial: 75 mmHg — ABNORMAL LOW (ref 80.0–100.0)

## 2012-01-31 LAB — COMPREHENSIVE METABOLIC PANEL
AST: 23 U/L (ref 0–37)
Albumin: 4.2 g/dL (ref 3.5–5.2)
Calcium: 10.3 mg/dL (ref 8.4–10.5)
Creatinine, Ser: 1.23 mg/dL (ref 0.50–1.35)

## 2012-01-31 LAB — PHOSPHORUS: Phosphorus: 4.5 mg/dL (ref 2.3–4.6)

## 2012-01-31 LAB — TROPONIN I: Troponin I: <0.3 ng/mL (ref <0.30)

## 2012-01-31 LAB — CBC WITH DIFFERENTIAL/PLATELET
Eosinophils Absolute: 0 10*3/uL (ref 0.0–0.7)
Hemoglobin: 14.1 g/dL (ref 13.0–17.0)
Lymphocytes Relative: 9 % — ABNORMAL LOW (ref 12–46)
Lymphs Abs: 0.7 10*3/uL (ref 0.7–4.0)
MCH: 31 pg (ref 26.0–34.0)
MCV: 90.1 fL (ref 78.0–100.0)
Monocytes Relative: 3 % (ref 3–12)
Neutrophils Relative %: 87 % — ABNORMAL HIGH (ref 43–77)
RBC: 4.55 MIL/uL (ref 4.22–5.81)
WBC: 7.4 10*3/uL (ref 4.0–10.5)

## 2012-01-31 LAB — D-DIMER, QUANTITATIVE: D-Dimer, Quant: 1.43 ug/mL-FEU — ABNORMAL HIGH (ref 0.00–0.48)

## 2012-01-31 LAB — TYPE AND SCREEN
ABO/RH(D): O NEG
Antibody Screen: NEGATIVE

## 2012-01-31 LAB — PROTIME-INR: INR: 0.97 (ref 0.00–1.49)

## 2012-01-31 LAB — PRO B NATRIURETIC PEPTIDE: Pro B Natriuretic peptide (BNP): 170 pg/mL (ref 0–450)

## 2012-01-31 LAB — AMYLASE: Amylase: 66 U/L (ref 0–105)

## 2012-01-31 MED ORDER — HEPARIN SODIUM (PORCINE) 5000 UNIT/ML IJ SOLN
5000.0000 [IU] | Freq: Two times a day (BID) | INTRAMUSCULAR | Status: DC
  Administered 2012-01-31 – 2012-02-06 (×12): 5000 [IU] via SUBCUTANEOUS
  Filled 2012-01-31 (×14): qty 1

## 2012-01-31 MED ORDER — DOXYCYCLINE HYCLATE 100 MG IV SOLR
100.0000 mg | Freq: Two times a day (BID) | INTRAVENOUS | Status: DC
  Administered 2012-01-31 – 2012-02-02 (×4): 100 mg via INTRAVENOUS
  Filled 2012-01-31 (×5): qty 100

## 2012-01-31 MED ORDER — IPRATROPIUM BROMIDE 0.02 % IN SOLN
0.5000 mg | RESPIRATORY_TRACT | Status: DC
  Administered 2012-01-31 – 2012-02-01 (×3): 0.5 mg via RESPIRATORY_TRACT
  Filled 2012-01-31 (×3): qty 2.5

## 2012-01-31 MED ORDER — ONDANSETRON HCL 4 MG/2ML IJ SOLN: 4.0000 mg | Freq: Three times a day (TID) | INTRAMUSCULAR | Status: DC | PRN

## 2012-01-31 MED ORDER — OXYCODONE-ACETAMINOPHEN 5-325 MG PO TABS
1.0000 | ORAL_TABLET | ORAL | Status: DC | PRN
  Administered 2012-02-01 – 2012-02-02 (×5): 1 via ORAL
  Filled 2012-01-31 (×5): qty 1

## 2012-01-31 MED ORDER — PANTOPRAZOLE SODIUM 40 MG PO TBEC
40.0000 mg | DELAYED_RELEASE_TABLET | Freq: Every day | ORAL | Status: DC
  Administered 2012-01-31 – 2012-02-06 (×7): 40 mg via ORAL
  Filled 2012-01-31 (×7): qty 1

## 2012-01-31 MED ORDER — SODIUM CHLORIDE 0.9 % IV SOLN: 250.0000 mL | INTRAVENOUS | Status: DC | PRN

## 2012-01-31 MED ORDER — METHYLPREDNISOLONE SODIUM SUCC 40 MG IJ SOLR
40.0000 mg | Freq: Two times a day (BID) | INTRAMUSCULAR | Status: DC
  Administered 2012-01-31 – 2012-02-02 (×4): 40 mg via INTRAVENOUS
  Filled 2012-01-31 (×7): qty 1

## 2012-01-31 MED ORDER — ALBUTEROL SULFATE (5 MG/ML) 0.5% IN NEBU
2.5000 mg | INHALATION_SOLUTION | Freq: Four times a day (QID) | RESPIRATORY_TRACT | Status: DC
  Administered 2012-01-31 – 2012-02-01 (×3): 2.5 mg via RESPIRATORY_TRACT
  Filled 2012-01-31 (×3): qty 0.5

## 2012-01-31 NOTE — Progress Notes (Signed)
Pt asking for something for pain and nausea. RN paged MD on call. Orders were put in. -Judy Pimple, RN

## 2012-01-31 NOTE — Progress Notes (Signed)
[name] [id number] Admission Data: 01/31/2012 6:50 PM Attending Provider: Kalman Shan, MD  WUJ:WJXBJYN,WGNFAO R, MD Consults/ Treatment Team:    [name] is a 77 y.o. male patient admitted from ED awake, alert  & orientated  X 3,  Full Code, VSS - There were no vitals taken for this visit., no c/o shortness of breath, no c/o chest pain, no distress noted.   Allergies:   Allergies  Allergen Reactions  . Lipitor (Atorvastatin Calcium)     myalgias  . Tiotropium Bromide Monohydrate     hoarseness     Past Medical History  Diagnosis Date  . Hyperlipidemia   . Chronic back pain   . History of colon polyps   . Spinal stenosis   . Cervical spondylosis   . Cancer 2004    prostate  . Peripheral neuropathy   . Anxiety   . COPD (chronic obstructive pulmonary disease)   . BPH (benign prostatic hypertrophy)   . ED (erectile dysfunction)   . Pernicious anemia   . Peripheral vascular disease   . Rheumatoid arthritis   . Osteoporosis   . Hypertension     History:  obtained from the patient. Tobacco/alcohol: Smoked 1 packs per day for 60 years none  Pt orientation to unit, room and routine. Information packet given to patient/family and safety video watched.  Admission INP armband ID verified with patient/family, and in place. SR up x 2, fall risk assessment complete with Patient and family verbalizing understanding of risks associated with falls. Pt verbalizes an understanding of how to use the call bell and to call for help before getting out of bed.  Skin, clean-dry- intact without evidence of bruising, or skin tears.   No evidence of skin break down noted on exam. ecchymoses - scattered    Will cont to monitor and assist as needed.  Charles Deeg Consuella Lose, RN 01/31/2012 6:50 PM

## 2012-01-31 NOTE — H&P (Signed)
PULMONARY  / CRITICAL CARE MEDICINE  Name: [name] MRN: [id number] DOB: 04/08/1934    LOS:  Date of admit No admission date for patient encounter.    REFERRING PROVIDER:  Dr Willis Modena and PCP Thora Lance, MD   CHIEF COMPLAINT:  Multiple   LEVEL OF CARE:  floor PRIMARY SERVICE:  pccm and then from 02/01/12 [facility] CONSULTANTS:  None on date of admit 02/01/12 CODE STATUS:  Presumed full DIET:  regular DVT Px:  heparin GI Px:  pepcid  HISTORY OF PRESENT ILLNESS:   77 year old, 60 pack smoker. Referred for dyspnea by Dr Lenoria Farrier of [facility] for copd seen on cxr and dyspnea. He lives by himself after wife died. Estranged from  Kids (sort of). He is not clear about his symptoms and there are multiple and very hard to sort out all    He had right hip fracture and s/p hip replacement by DR Magnus Ivan in July 2013 (uneventful post op). Since then multiple symptoms. Says he feels he is dying. Says he is scared of going to sleep because he feels he will be dead. HE is constantly worried and scared.  Also no energy, abdominal pain, rapid weight loss (25# since July 213, 2# since yesterday) and rapid loss of independence of functionality. Feels rapid muscle loss.  Also, poor appetitie. All this within past 3 weeks. Saw Dr Dulce Sellar yesterday 12/161/3  for abdominal pain. GI notes indicae patient has biliary dilatation with ampullary mass and is in need of EUS but needs pulmonary clearance. In terms of respiratory issues: asymptomatic till 3 weeks ago per hx. HE says till  Weeks ago no cough and no dyspnea and quite functional. And since then developed above issues along with cough, shortness of breath and greenish sputum all new   He also admits to significant depression and anxiety. HADS screening score 18 for anxiety and 14 for depression - which is strongly positive  However, his symptoms are too complex and multiple to give pulmonayr clearance. Hiis social situation is too complex and  lot of issues need to be addrssed. He might be in AECOPD. BEst he gets admitted. I d/w Dr Cena Benton of [facility]. PCCM will admit for 01/31/12. [facility] will assume primary 02/01/12  PAST MEDICAL HISTORY :  Past Medical History  Diagnosis Date  . Hyperlipidemia   . Chronic back pain   . History of colon polyps   . Spinal stenosis   . Cervical spondylosis   . Cancer 2004    prostate  . Peripheral neuropathy   . Anxiety   . COPD (chronic obstructive pulmonary disease)   . BPH (benign prostatic hypertrophy)   . ED (erectile dysfunction)   . Pernicious anemia   . Peripheral vascular disease   . Rheumatoid arthritis   . Osteoporosis   . Hypertension    Past Surgical History  Procedure Date  . Basal cell carcinoma excision     Nose x 3  . Spine surgery     Cervical Spine X 4 levels  . Cholecystectomy   . Wrist fusion     Right Wrist  . Hemorroidectomy   . Knee surgery     X 7 surgeries  . Iliac artery stent 12-07-09    Stent done by Dr. Allyson Sabal  . Total hip arthroplasty 08/16/2011    Procedure: TOTAL HIP ARTHROPLASTY ANTERIOR APPROACH;  Surgeon: Kathryne Hitch, MD;  Location: [facility] OR;  Service: Orthopedics;  Laterality: Right;  Right  total hip replacement   Prior to Admission medications   Medication Sig Start Date End Date Taking? Authorizing Provider  albuterol (PROVENTIL) (2.5 MG/3ML) 0.083% nebulizer solution Take 2.5 mg by nebulization every 6 (six) hours as needed.    Historical Provider, MD  ALPRAZolam Prudy Feeler) 1 MG tablet Take 1 tablet (1 mg total) by mouth at bedtime as needed for sleep or anxiety. For anxiety/sleep. 08/19/11   Ripudeep Jenna Luo, MD  famotidine (PEPCID) 20 MG tablet Take 1 tablet (20 mg total) by mouth 2 (two) times daily. 01/26/12   Raeford Razor, MD  feeding supplement (ENSURE COMPLETE) LIQD Take 237 mLs by mouth 2 (two) times daily between meals. 08/19/11   Ripudeep Jenna Luo, MD  Fluticasone-Salmeterol (ADVAIR DISKUS) 250-50 MCG/DOSE AEPB Inhale 1 puff into the lungs  every 12 (twelve) hours.    Historical Provider, MD  Multiple Vitamin (MULTIVITAMIN) tablet Take 1 tablet by mouth daily.    Historical Provider, MD  oxyCODONE-acetaminophen (PERCOCET) 5-325 MG per tablet Take 1 tablet by mouth every 6 (six) hours. For pain. 08/19/11   Ripudeep Jenna Luo, MD  predniSONE (DELTASONE) 20 MG tablet Take 2 tablets (40 mg total) by mouth daily. 01/26/12   Raeford Razor, MD  rosuvastatin (CRESTOR) 20 MG tablet Take 20 mg by mouth daily.    Historical Provider, MD   Allergies  Allergen Reactions  . Lipitor (Atorvastatin Calcium)     myalgias  . Tiotropium Bromide Monohydrate     hoarseness    FAMILY HISTORY:  Family History  Problem Relation Age of Onset  . Hypertension Mother   . Cancer Mother     colon cancer   SOCIAL HISTORY:  reports that he has been smoking Cigarettes.  He has a 60 pack-year smoking history. He does not have any smokeless tobacco history on file. He reports that he does not drink alcohol or use illicit drugs.  REVIEW OF SYSTEMS:    Review of Systems  Constitutional: Positive for fatigue and unexpected weight change. Negative for fever.  HENT: Negative for ear pain, nosebleeds, congestion, sore throat, rhinorrhea, sneezing, trouble swallowing, dental problem, postnasal drip and sinus pressure.   Eyes: Negative for redness and itching.  Respiratory: Positive for cough and shortness of breath. Negative for chest tightness and wheezing.   Cardiovascular: Positive for chest pain. Negative for palpitations and leg swelling.  Gastrointestinal: Positive for abdominal pain. Negative for nausea and vomiting.  Genitourinary: Negative for dysuria.  Musculoskeletal: Negative for joint swelling.  Skin: Negative for rash.  Neurological: Negative for headaches.  Hematological: Does not bruise/bleed easily.  Psychiatric/Behavioral: Negative for dysphoric mood. The patient is not nervous/anxious.      VITAL SIGNS: Temp:  [97.8 F (36.6 C)] 97.8 F  (36.6 C) (12/17 1552) Pulse Rate:  [86] 86  (12/17 1552) BP: (120)/(68) 120/68 mmHg (12/17 1552) SpO2:  [95 %] 95 % (12/17 1552) FiO2 (%):  [21 %] 21 % (12/17 1554) Weight:  [110 lb (49.896 kg)] 110 lb (49.896 kg) (12/17 1552)  PHYSICAL EXAMINATION: General:  Cachecti male sitting in wheel chair. Looks to be in pain. Says he drove himself to office Neuro:  AxOx3. Speech normal. GCS 15. RASS +1. CAM-ICU neg for delirim HEENT:  Supple neck. No neck nodes. No elevatd JVP Neck:  PEERL + Cardiovascular:  S1S+. No murmurs Lungs:  BArrell chest. Coarse. Expiration prolonged Abdomen:  Soft. Non specific tendeness + Musculoskeletal:  Cachectic Skin:  intact   Lab 01/26/12 1807  NA 135  K 5.1  CL 95*  CO2 28  BUN 25*  CREATININE 1.21  GLUCOSE 109*    Lab 01/26/12 1807  HGB 15.2  HCT 43.6  WBC 7.9  PLT 198   No results found.   Lab 01/26/12 1807  AST 26  ALT 17  ALKPHOS 100  BILITOT 0.4  PROT 7.8  ALBUMIN 4.2  INR --   IMAGING No results found.  CARDIAC  No results found for this basename: TROPONINI:5 in the last 168 hours No results found for this basename: PROBNP:5 in the last 168 hours  SEPSIS No results found for this basename: PROCALCITON:5 in the last 168 hours   ASSESSMENT / PLAN:  Patient Active Hospital Problem List: Pancreatic mass (01/31/2012)    POA: Yes   Assessment: Per notes of Dr Willis Modena of GI - mass in ampulla   Plan: GI consult needed, [facility] 02/01/12 to be called by [facility]  Depression and Anxity (01/31/2012)    POA: Yes   Assessment: Positive HADS (hospital anxiety and depression screen) 01/31/12   Plan: psych consult to be called by [facility] 02/01/12  Failure to thrive, Weight Loss  and Cachexia (01/31/2012)    POA: Yes   Assessment: Unclear cause ? Pancreatic mass and depression related   Plan: Call nutrition consult in interim. Workup as above  COPD exacerbation (01/31/2012)    POA: Yes   Assessment: Possibly has AECOPD    Plan: nebs, abx and steroids. PCCM will follow as consult from 02/01/12    Dr. Kalman Shan, M.D., [facility].C.P Pulmonary and Critical Care Medicine Staff Physician [facility] Pager: [phone], If no answer or between  15:00h - 7:00h: call [phone]  01/31/2012 6:08 PM

## 2012-01-31 NOTE — Progress Notes (Signed)
eLink Physician-Brief Progress Note Patient Name: [name] DOB: 06-Nov-1934 MRN: [id number]  Date of Service  01/31/2012   HPI/Events of Note  Abd Pain 6/10 - requesting pain medications - HD stable  In no resp distress   eICU Interventions  Roxicet 5/325 mg 1 q4 hours prn pain   Intervention Category Intermediate Interventions: Pain - evaluation and management  Taiyo Kozma 01/31/2012, 11:41 PM

## 2012-01-31 NOTE — Progress Notes (Signed)
Subjective:    Patient ID: [name], male    DOB: [birth year], 77 y.o.   MRN: [id number]  HPI       Past Medical History  Diagnosis Date  . Hyperlipidemia   . Chronic back pain   . History of colon polyps   . Spinal stenosis   . Cervical spondylosis   . Cancer 2004    prostate  . Peripheral neuropathy   . Anxiety   . COPD (chronic obstructive pulmonary disease)   . BPH (benign prostatic hypertrophy)   . ED (erectile dysfunction)   . Pernicious anemia   . Peripheral vascular disease   . Rheumatoid arthritis   . Osteoporosis   . Hypertension      Family History  Problem Relation Age of Onset  . Hypertension Mother   . Cancer Mother     colon cancer     History   Social History  . Marital Status: Widowed    Spouse Name: N/A    Number of Children: N/A  . Years of Education: N/A   Occupational History  . Not on file.   Social History Main Topics  . Smoking status: Current Every Day Smoker -- 1.0 packs/day for 60 years    Types: Cigarettes  . Smokeless tobacco: Not on file  . Alcohol Use: No  . Drug Use: No  . Sexually Active: Not on file   Other Topics Concern  . Not on file   Social History Narrative  . No narrative on file     Allergies  Allergen Reactions  . Lipitor (Atorvastatin Calcium)     myalgias  . Tiotropium Bromide Monohydrate     hoarseness     Outpatient Prescriptions Prior to Visit  Medication Sig Dispense Refill  . ALPRAZolam (XANAX) 1 MG tablet Take 1 tablet (1 mg total) by mouth at bedtime as needed for sleep or anxiety. For anxiety/sleep.  30 tablet  0  . famotidine (PEPCID) 20 MG tablet Take 1 tablet (20 mg total) by mouth 2 (two) times daily.  30 tablet  0  . feeding supplement (ENSURE COMPLETE) LIQD Take 237 mLs by mouth 2 (two) times daily between meals.      . Fluticasone-Salmeterol (ADVAIR DISKUS) 250-50 MCG/DOSE AEPB Inhale 1 puff into the lungs every 12 (twelve) hours.      . Multiple Vitamin (MULTIVITAMIN) tablet  Take 1 tablet by mouth daily.      Marland Kitchen oxyCODONE-acetaminophen (PERCOCET) 5-325 MG per tablet Take 1 tablet by mouth every 6 (six) hours. For pain.  30 tablet  0  . predniSONE (DELTASONE) 20 MG tablet Take 2 tablets (40 mg total) by mouth daily.  10 tablet  0  . rosuvastatin (CRESTOR) 20 MG tablet Take 20 mg by mouth daily.       Last reviewed on 01/31/2012  3:52 PM by Darrell Jewel, CMA    Review of Systems  Constitutional: Positive for fatigue and unexpected weight change. Negative for fever.  HENT: Negative for ear pain, nosebleeds, congestion, sore throat, rhinorrhea, sneezing, trouble swallowing, dental problem, postnasal drip and sinus pressure.   Eyes: Negative for redness and itching.  Respiratory: Positive for cough and shortness of breath. Negative for chest tightness and wheezing.   Cardiovascular: Positive for chest pain. Negative for palpitations and leg swelling.  Gastrointestinal: Positive for abdominal pain. Negative for nausea and vomiting.  Genitourinary: Negative for dysuria.  Musculoskeletal: Negative for joint swelling.  Skin: Negative for rash.  Neurological:  Negative for headaches.  Hematological: Does not bruise/bleed easily.  Psychiatric/Behavioral: Negative for dysphoric mood. The patient is not nervous/anxious.        Objective:   Physical Exam        Assessment & Plan:

## 2012-01-31 NOTE — Progress Notes (Signed)
ANTIBIOTIC CONSULT NOTE - INITIAL  Pharmacy Consult for Doxycycline Indication: AE COPD  Allergies  Allergen Reactions  . Lipitor (Atorvastatin Calcium)     myalgias  . Tiotropium Bromide Monohydrate     hoarseness    Patient Measurements:   Adjusted Body Weight: pending  Vital Signs: Temp: 98.2 F (36.8 C) (12/17 1851) Temp src: Oral (12/17 1851) BP: 116/71 mmHg (12/17 1851) Pulse Rate: 87  (12/17 1851) Intake/Output from previous day:   Intake/Output from this shift:    Labs: Scr = 1.21 as OP 12/12.  No results found for this basename: WBC:3,HGB:3,PLT:3,LABCREA:3,CREATININE:3 in the last 72 hours The CrCl is unknown because both a height and weight (above a minimum accepted value) are required for this calculation. No results found for this basename: VANCOTROUGH:2,VANCOPEAK:2,VANCORANDOM:2,GENTTROUGH:2,GENTPEAK:2,GENTRANDOM:2,TOBRATROUGH:2,TOBRAPEAK:2,TOBRARND:2,AMIKACINPEAK:2,AMIKACINTROU:2,AMIKACIN:2, in the last 72 hours   Microbiology: No results found for this or any previous visit (from the past 720 hour(s)).  Medical History: Past Medical History  Diagnosis Date  . Hyperlipidemia   . Chronic back pain   . History of colon polyps   . Spinal stenosis   . Cervical spondylosis   . Cancer 2004    prostate  . Peripheral neuropathy   . Anxiety   . COPD (chronic obstructive pulmonary disease)   . BPH (benign prostatic hypertrophy)   . ED (erectile dysfunction)   . Pernicious anemia   . Peripheral vascular disease   . Rheumatoid arthritis   . Osteoporosis   . Hypertension     Medications:  Prescriptions prior to admission  Medication Sig Dispense Refill  . albuterol (PROVENTIL) (2.5 MG/3ML) 0.083% nebulizer solution Take 2.5 mg by nebulization every 6 (six) hours as needed.      . ALPRAZolam (XANAX) 1 MG tablet Take 1 tablet (1 mg total) by mouth at bedtime as needed for sleep or anxiety. For anxiety/sleep.  30 tablet  0  . famotidine (PEPCID) 20 MG  tablet Take 1 tablet (20 mg total) by mouth 2 (two) times daily.  30 tablet  0  . feeding supplement (ENSURE COMPLETE) LIQD Take 237 mLs by mouth 2 (two) times daily between meals.      . Fluticasone-Salmeterol (ADVAIR DISKUS) 250-50 MCG/DOSE AEPB Inhale 1 puff into the lungs every 12 (twelve) hours.      . Multiple Vitamin (MULTIVITAMIN) tablet Take 1 tablet by mouth daily.      Marland Kitchen oxyCODONE-acetaminophen (PERCOCET) 5-325 MG per tablet Take 1 tablet by mouth every 6 (six) hours. For pain.  30 tablet  0  . predniSONE (DELTASONE) 20 MG tablet Take 2 tablets (40 mg total) by mouth daily.  10 tablet  0  . rosuvastatin (CRESTOR) 20 MG tablet Take 20 mg by mouth daily.       Assessment: 77 yo male admitted with pancreatic mass, failure to thrive and ?COPD exacerbation.  Pharmacy asked to start empiric doxycycline.  Goal of Therapy:  resolution of exacerbation  Plan:  1. Doxycycline 100 mg IV BID. 2. Evaluate for length of therapy and conversion to po when appropriate. 3. F/U clinical course.  Nealy Hickmon, Gwenlyn Found 01/31/2012,7:24 PM

## 2012-01-31 NOTE — Patient Instructions (Addendum)
Report to [facility] for admission

## 2012-02-01 ENCOUNTER — Inpatient Hospital Stay (HOSPITAL_COMMUNITY): Payer: Medicare Other

## 2012-02-01 DIAGNOSIS — F17200 Nicotine dependence, unspecified, uncomplicated: Secondary | ICD-10-CM

## 2012-02-01 DIAGNOSIS — F341 Dysthymic disorder: Secondary | ICD-10-CM

## 2012-02-01 LAB — BASIC METABOLIC PANEL
CO2: 30 mEq/L (ref 19–32)
Calcium: 10 mg/dL (ref 8.4–10.5)
GFR calc non Af Amer: 57 mL/min — ABNORMAL LOW (ref >90)
Glucose, Bld: 170 mg/dL — ABNORMAL HIGH (ref 70–99)
Potassium: 5.5 mEq/L — ABNORMAL HIGH (ref 3.5–5.1)
Sodium: 133 mEq/L — ABNORMAL LOW (ref 135–145)

## 2012-02-01 LAB — CBC
Hemoglobin: 13.9 g/dL (ref 13.0–17.0)
MCH: 31 pg (ref 26.0–34.0)
Platelets: 134 10*3/uL — ABNORMAL LOW (ref 150–400)
RBC: 4.49 MIL/uL (ref 4.22–5.81)
WBC: 7.7 10*3/uL (ref 4.0–10.5)

## 2012-02-01 LAB — TROPONIN I: Troponin I: <0.3 ng/mL (ref <0.30)

## 2012-02-01 LAB — MAGNESIUM: Magnesium: 2.4 mg/dL (ref 1.5–2.5)

## 2012-02-01 LAB — PHOSPHORUS: Phosphorus: 3.4 mg/dL (ref 2.3–4.6)

## 2012-02-01 MED ORDER — BUPROPION HCL ER (XL) 150 MG PO TB24
150.0000 mg | ORAL_TABLET | Freq: Every day | ORAL | Status: DC
  Administered 2012-02-01 – 2012-02-02 (×2): 150 mg via ORAL
  Filled 2012-02-01 (×3): qty 1

## 2012-02-01 MED ORDER — ALBUTEROL SULFATE (5 MG/ML) 0.5% IN NEBU
2.5000 mg | INHALATION_SOLUTION | Freq: Four times a day (QID) | RESPIRATORY_TRACT | Status: DC
  Administered 2012-02-01 – 2012-02-05 (×10): 2.5 mg via RESPIRATORY_TRACT
  Filled 2012-02-01 (×12): qty 0.5

## 2012-02-01 MED ORDER — ADULT MULTIVITAMIN W/MINERALS CH
1.0000 | ORAL_TABLET | Freq: Every day | ORAL | Status: DC
  Administered 2012-02-02 – 2012-02-06 (×5): 1 via ORAL
  Filled 2012-02-01 (×6): qty 1

## 2012-02-01 MED ORDER — IPRATROPIUM BROMIDE 0.02 % IN SOLN
0.5000 mg | Freq: Four times a day (QID) | RESPIRATORY_TRACT | Status: DC
  Administered 2012-02-01 – 2012-02-05 (×10): 0.5 mg via RESPIRATORY_TRACT
  Filled 2012-02-01 (×12): qty 2.5

## 2012-02-01 MED ORDER — ZOLPIDEM TARTRATE 5 MG PO TABS
5.0000 mg | ORAL_TABLET | Freq: Every day | ORAL | Status: DC
  Administered 2012-02-01: 5 mg via ORAL
  Filled 2012-02-01: qty 1

## 2012-02-01 MED ORDER — ENSURE COMPLETE PO LIQD
237.0000 mL | Freq: Two times a day (BID) | ORAL | Status: DC
  Administered 2012-02-01 – 2012-02-06 (×8): 237 mL via ORAL

## 2012-02-01 MED ORDER — ALPRAZOLAM 0.5 MG PO TABS
1.0000 mg | ORAL_TABLET | Freq: Every evening | ORAL | Status: DC | PRN
  Administered 2012-02-01 – 2012-02-04 (×3): 1 mg via ORAL
  Filled 2012-02-01 (×3): qty 2

## 2012-02-01 MED ORDER — ZOLPIDEM TARTRATE 5 MG PO TABS: 10.0000 mg | ORAL_TABLET | Freq: Every day | ORAL | Status: DC

## 2012-02-01 NOTE — Progress Notes (Signed)
eLink Physician-Brief Progress Note Patient Name: JACIER GLADU DOB: 08/13/1934 MRN: [id number]  Date of Service  02/01/2012   HPI/Events of Note  Patient requesting home xanax for sleep   eICU Interventions  Plan: Xanax 1 mg po qhs prn    Intervention Category Minor Interventions: Routine modifications to care plan (e.g. PRN medications for pain, fever)  Dalayna Lauter 02/01/2012, 1:23 AM

## 2012-02-01 NOTE — Progress Notes (Signed)
Patient ID: [name], male   DOB: [birth year], 76 y.o.   MRN: [id number] [facility] PROGRESS NOTE  [name] WUJ:[id number] DOB: Jun 11, 1934 DOA: 01/31/2012 PCP: Thora Lance, MD  Assessment/Plan:  Abdominal pain / Weight Loss / Pancreatic Mass Ampullary mass being evaluated by [facility].  Pulmonary Clearance needed for endoscopic procedure-EUS Weight loss 150 -> 110 over the course of several months.  Unable to eat.  Nutrition consult requested Oxycodone / Acetaminophen for pain.  Acute on COPD Being follow by pulmonary Nebs/Steroids/doxycycline Lungs with still some rhonchi  Anxiety / Depression Psychiatry consulted. Patient with anxiety.  Recent loss of his wife.  Feelings of impending death. On Xanax qhs to sleep  Chronic Pain Severe PVD with chronic occlusion of right lower extremity Cervical spondylosis DDD L5 - S1 Continue with as needed Percocet  GERD - Continue PPI  HIstory of Prostate Ca/Breast Ca Quiet.  No issues currently  DVT prophylaxis Prophylactic  heparin   Code Status: full Family Communication:  Disposition Plan: to home when appropriate.   Consultants:  PCCM:  Dr. Marchelle Gearing  GI:  Dr. Evette Cristal (Dr. Dulce Sellar is his primary GI)  Psych:  Dr. Ferol Luz  Procedures:    Antibiotics:  Docusate  HPI/Subjective: Complaining of pain in his right foot and leg.  Objective: Filed Vitals:   01/31/12 1851 01/31/12 2100 02/01/12 0500 02/01/12 0603  BP: 116/71 133/78  124/69  Pulse: 87 84  54  Temp: 98.2 F (36.8 C) 98.6 F (37 C)  97.7 F (36.5 C)  TempSrc: Oral Oral  Oral  Resp: 18 20  16   Height:    5\' 8"  (1.727 m)  Weight:   40.1 kg (88 lb 6.5 oz) 40.1 kg (88 lb 6.5 oz)  SpO2: 95% 95%  98%   No intake or output data in the 24 hours ending 02/01/12 0838 Filed Weights   02/01/12 0500 02/01/12 0603  Weight: 40.1 kg (88 lb 6.5 oz) 40.1 kg (88 lb 6.5 oz)    Exam:   General:  A&O, Sitting up in bed, Thin,  non-toxic, pleasant  Cardiovascular: RRR, no M/R/G  Respiratory: Little air movement posteriorly, expiratory wheeze bilaterally anteriorly.  No accessory muscle use.  Abdomen: Thin, + BS, non distended, no masses  Musculo-skeletal:  Able to move all 4 extremities  Psych:  A&O, Cooperative, Well Groomed.  Reports that he is afraid to go to sleep.  Feels like he is going to die.    Data Reviewed: Basic Metabolic Panel:  Lab 01/31/12 9562 01/26/12 1807  NA 133* 135  K 4.7 5.1  CL 93* 95*  CO2 27 28  GLUCOSE 147* 109*  BUN 27* 25*  CREATININE 1.23 1.21  CALCIUM 10.3 10.6*  MG 2.4 --  PHOS 4.5 --   Liver Function Tests:  Lab 01/31/12 2006 01/26/12 1807  AST 23 26  ALT 16 17  ALKPHOS 94 100  BILITOT 0.6 0.4  PROT 7.5 7.8  ALBUMIN 4.2 4.2    Lab 01/31/12 2006 01/26/12 1807  LIPASE 17 16  AMYLASE 66 --   CBC:  Lab 01/31/12 2006 01/26/12 1807  WBC 7.4 7.9  NEUTROABS 6.5 6.7  HGB 14.1 15.2  HCT 41.0 43.6  MCV 90.1 90.1  PLT 148* 198   Cardiac Enzymes:  Lab 02/01/12 0159 01/31/12 2007  CKTOTAL -- --  CKMB -- --  CKMBINDEX -- --  TROPONINI <0.30 <0.30   BNP (last 3 results)  Basename 01/31/12 2007  PROBNP 170.0  Studies: No results found.  Scheduled Meds:   . albuterol  2.5 mg Nebulization Q6H   And  . ipratropium  0.5 mg Nebulization Q4H  . doxycycline (VIBRAMYCIN) IV  100 mg Intravenous Q12H  . heparin  5,000 Units Subcutaneous Q12H  . methylPREDNISolone (SOLU-MEDROL) injection  40 mg Intravenous Q12H  . pantoprazole  40 mg Oral Daily   Continuous Infusions:   Principal Problem:  *Pancreatic mass Active Problems:  Depression  Failure to thrive  Cachexia  COPD exacerbation  Weight loss    Time spent: 35 min.    Stephani Police  [facility] Pager [phone]. If 8PM-8AM, please contact night-coverage at [email or web address], password [facility] 02/01/2012, 8:38 AM  LOS: 1 day     Attending - I have seen and examined [name]. I  reviewed the above documentation, have made necessary changes. Ms. Sharol Harness was recently diagnosed with probable pancreatic cancer, he has evidence of intrahepatic and extrahepatic biliary dilatation on CT scan of the abdomen that was recently done. He has been followed by Dr. Dulce Sellar off [name] needs a EUS, however needed his COPD exacerbation to be better controlled before proceeding with this. He was hence admitted. He seems comfortable, but still has some wheezing on his lung exam. We will continue Solu-Medrol and nebulized bronchodilators. Continue IV doxycycline. We have already consulted [facility] and psychiatry as well.  S Lorrene Graef

## 2012-02-01 NOTE — Consult Note (Signed)
Patient Identification:  [name] Date of Evaluation:  02/01/2012 Reason for Consult: Depression, Anxiety  Referring Provider: Stormy Card, [place], Dr. Gean Birchwood  History of Present Illness:Pt has been in ED since last Thurs.  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  He says he cannot breathe, cannot eat or sleep   He has abdominal pain.  He has lost wt: now 110 lbs and was 150 lbs.  He had his hip replaces, walks  He had 7 surgeries on R knee [20 total incl neck].  He has been getting nebulizer treatments to breathe better.    Past Psychiatric History:He recently spent 2 wks at [facility].  He drinks beer -6 pk; Whiskey, White Russians.  He began drinking at age 77   He has smoked cigarettes for 60 years 1 1/2 pks/day.  Now he smokes 1/2 cigarette a day.  He has to sleep in a recliner at home ~ 3 hrs daily; no daily naps.  He takes Xanax for sleep  He has had thoughts of suicide, no action; He wants his RoxyCodon 30 mg 3 X daily.     Past Medical History:     Past Medical History  Diagnosis Date  . Hyperlipidemia   . History of colon polyps   . Spinal stenosis   . Anxiety   . BPH (benign prostatic hypertrophy)   . ED (erectile dysfunction)   . Pernicious anemia   . Osteoporosis   . Hypertension   . Chest pain     "I've had it" (01/31/2012)   . Peripheral vascular disease     "right leg is 100% blocked; left leg has stent, still ~ 75% blocked" (01/31/2012)  . Peripheral neuropathy     "bad" (01/31/2012)  . COPD (chronic obstructive pulmonary disease)   . Emphysema   . Pneumonia 1960's    "double" (01/31/2012)  . BRBPR (bright red blood per rectum)     "don't know what it's from; had some this week" (01/31/2012)  . Cervical spondylosis   . Rheumatoid arthritis   . Arthritis     "hands and feet" (01/31/2012)  . DDD (degenerative disc disease), lumbosacral     "S1; L5" (01/31/2012)  . Chronic lower back pain   . Depression    "imagine I've got a mild depression w/what I've gone thru last month" (01/31/2012)  . Prostate cancer 2004    "had 40 treatments of radiation" (01/31/2012)  . Breast cancer in male     "left" (01/31/2012)  . Basal cell carcinoma of nasal tip     "       Past Surgical History  Procedure Date  . Basal cell carcinoma excision     Nose x 3  . Cholecystectomy ?1990's  . Wrist fusion     Right Wrist; "3 OR's; it's fused" (01/31/2012)  . Excisional hemorrhoidectomy 1960's  . Knee surgery     "right; X 6 surgeries; went in for simple cartilage OR; ended up w/fused knee" (01/31/2012)  . Iliac artery stent 12-07-09    Stent done by Dr. Allyson Sabal  . Total hip arthroplasty 08/16/2011    Procedure: TOTAL HIP ARTHROPLASTY ANTERIOR APPROACH;  Surgeon: Kathryne Hitch, MD;  Location: [facility] OR;  Service: Orthopedics;  Laterality: Right;  Right total hip replacement  . Cervical disc surgery     "7 total; ended w/a fusion" (01/31/2012)  . Breast lumpectomy 1990's    "left" (01/31/2012)  Allergies:  Allergies  Allergen Reactions  . Lipitor (Atorvastatin Calcium) Other (See Comments)    Myalgias "don't remember how bad" (01/31/2012)  . Tiotropium Bromide Monohydrate Other (See Comments)    hoarseness    Current Medications:  Prior to Admission medications   Medication Sig Start Date End Date Taking? Authorizing Provider  albuterol (PROVENTIL) (2.5 MG/3ML) 0.083% nebulizer solution Take 2.5 mg by nebulization every 6 (six) hours as needed. For wheezing   Yes Historical Provider, MD  ALPRAZolam Prudy Feeler) 1 MG tablet Take 1 mg by mouth at bedtime. 08/19/11  Yes Ripudeep Jenna Luo, MD  famotidine (PEPCID) 20 MG tablet Take 1 tablet (20 mg total) by mouth 2 (two) times daily. 01/26/12  Yes Raeford Razor, MD  feeding supplement (ENSURE COMPLETE) LIQD Take 237 mLs by mouth 2 (two) times daily between meals. 08/19/11  Yes Ripudeep Jenna Luo, MD  Fluticasone-Salmeterol (ADVAIR DISKUS) 250-50 MCG/DOSE AEPB Inhale 1  puff into the lungs every 12 (twelve) hours.   Yes Historical Provider, MD  Multiple Vitamin (MULTIVITAMIN) tablet Take 1 tablet by mouth daily.   Yes Historical Provider, MD  oxyCODONE-acetaminophen (PERCOCET) 5-325 MG per tablet Take 1 tablet by mouth every 6 (six) hours. For pain. 08/19/11  Yes Ripudeep Jenna Luo, MD  predniSONE (DELTASONE) 20 MG tablet Take 2 tablets (40 mg total) by mouth daily. 01/26/12  Yes Raeford Razor, MD  rosuvastatin (CRESTOR) 20 MG tablet Take 20 mg by mouth every morning.    Yes Historical Provider, MD    Social History:    reports that he has been smoking Cigarettes.  He has a 60 pack-year smoking history. He has never used smokeless tobacco. He reports that he does not drink alcohol or use illicit drugs.   Family History:    Family History  Problem Relation Age of Onset  . Hypertension Mother   . Cancer Mother     colon cancer    Mental Status Examination/Evaluation: Objective:  Appearance: Weary, cachectic  Eye Contact::  Good  Speech:  Clear and Coherent and Normal Rate  Volume:  Normal  Mood:  anxious  Affect:  Congruent  Thought Process:  Coherent, Goal Directed, Logical and wanting to get better; stop smoking  Orientation:  Full (Time, Place, and Person)  Thought Content:  anxiety, wants to get well, has great need to sleep  Suicidal Thoughts:  No  Homicidal Thoughts:  No  Judgement:  Fair  Insight:  Fair   DIAGNOSIS:   AXIS I   Anxiety with depressed mood;   AXIS II  Deferred  AXIS III See medical notes.  AXIS IV economic problems, occupational problems, other psychosocial or environmental problems, problems related to social environment and substance dependence  Serious weight loss; undiagnosed pancreatic mass  AXIS V 41-50 serious symptoms   Assessment/Plan: Discussed with Dr. Jerral Ralph, Psych CSW Pt is lying in bed.  He engages readily with good eye contact.  He knows the complete date.  He spells world and backwards correctly,  He  concentrates on serial 7s with one error.  He has recall 3 of 3 objects.  He shows good judgment with example.     He says his wife has died and is not well connected with his two children.   He has not been drinking - he's a weekend drinker; wants to stop smoking.  He is concerned about his ability to breathe and his lack of sleep for days.  He is used to his pain medication and says  what he gets is ineffective.   He agrees to an antidepressant and hypnotic.   Further discussion is suspended because he is falling asleep. RECOMMENDATION:  1.  Pt has capacity to participate in care. 2.  Suggest Wellbutrin XR 150 mg in am daily 3.  Suggest Ambien, zolpidem, 5 mg at bedtime.  4.  Will follow pt. Deserea Bordley MD 02/01/2012 10:40 AM

## 2012-02-01 NOTE — Evaluation (Signed)
Physical Therapy Evaluation Patient Details Name: [name] MRN: [id number] DOB: 1934-12-06 Today's Date: 02/01/2012 Time: 5621-3086 PT Time Calculation (min): 11 min  PT Assessment / Plan / Recommendation Clinical Impression  Pt with COPD and pancreatic mass.  Pt moving well and should be able to return home alone from PT standpoint.  Pt anxious/lonely being home alone.  Will follow acutely but shouldn't need any PT follow-up at DC.    PT Assessment  Patient needs continued PT services    Follow Up Recommendations  No PT follow up    Does the patient have the potential to tolerate intense rehabilitation      Barriers to Discharge        Equipment Recommendations  None recommended by PT    Recommendations for Other Services     Frequency Min 3X/week    Precautions / Restrictions Restrictions Weight Bearing Restrictions: No   Pertinent Vitals/Pain Slight SOB      Mobility  Bed Mobility Bed Mobility: Supine to Sit;Sitting - Scoot to Edge of Bed;Sit to Supine Supine to Sit: 7: Independent Sitting - Scoot to Edge of Bed: 7: Independent Sit to Supine: 7: Independent Transfers Transfers: Sit to Stand;Stand to Sit Sit to Stand: 5: Supervision Stand to Sit: 5: Supervision Ambulation/Gait Ambulation/Gait Assistance: 5: Supervision Ambulation Distance (Feet): 250 Feet Assistive device: Rolling walker Gait Pattern: Step-through pattern;Decreased stride length Gait velocity: decr    Shoulder Instructions     Exercises     PT Diagnosis: Generalized weakness;Difficulty walking  PT Problem List: Decreased strength;Decreased mobility;Decreased activity tolerance PT Treatment Interventions: DME instruction;Gait training;Functional mobility training;Patient/family education;Therapeutic activities;Therapeutic exercise;Balance training   PT Goals Acute Rehab PT Goals PT Goal Formulation: With patient Time For Goal Achievement: 02/08/12 Potential to Achieve Goals:  Good Pt will go Sit to Stand: with modified independence PT Goal: Sit to Stand - Progress: Goal set today Pt will go Stand to Sit: with modified independence PT Goal: Stand to Sit - Progress: Goal set today Pt will Ambulate: >150 feet;with modified independence;with least restrictive assistive device PT Goal: Ambulate - Progress: Goal set today  Visit Information  Last PT Received On: 02/01/12 Assistance Needed: +1    Subjective Data  Subjective: "I feel like I'm withering away." Patient Stated Goal: To get better   Prior Functioning  Home Living Lives With: Alone Type of Home: House Home Access: Level entry Home Layout: One level Bathroom Shower/Tub: Engineer, manufacturing systems: Standard Home Adaptive Equipment: Environmental consultant - rolling Prior Function Level of Independence: Independent with assistive device(s) Vocation: Retired Musician: No difficulties    Cognition  Overall Cognitive Status: Appears within functional limits for tasks assessed/performed Arousal/Alertness: Awake/alert Orientation Level: Appears intact for tasks assessed Behavior During Session: Anxious Cognition - Other Comments: Pt expresses anxiety of being at home alone.    Extremity/Trunk Assessment Right Lower Extremity Assessment RLE ROM/Strength/Tone: Deficits RLE ROM/Strength/Tone Deficits: Knee fused in full extension. Left Lower Extremity Assessment LLE ROM/Strength/Tone: Deficits LLE ROM/Strength/Tone Deficits: grossly 4/5   Balance Static Standing Balance Static Standing - Balance Support: Bilateral upper extremity supported Static Standing - Level of Assistance: 6: Modified independent (Device/Increase time)  End of Session PT - End of Session Activity Tolerance: Patient tolerated treatment well Patient left: in bed;with call bell/phone within reach Nurse Communication: Mobility status  GP     [facility] 02/01/2012, 11:55 AM  Skip Mayer PT [phone]

## 2012-02-01 NOTE — Progress Notes (Signed)
PULMONARY  / CRITICAL CARE MEDICINE  Name: [name] MRN: [id number] DOB: July 25, 1934    LOS: 1 Date of admit 01/31/2012  6:33 PM   REFERRING PROVIDER:  Dr Willis Modena and PCP Thora Lance, MD  CHIEF COMPLAINT:  Multiple  Brief HX:  77 yo male smoker admitted from pulmonary office 01/31/2012 with AECOPD.  Initial pulmonary evaluation requested for pre-op assessment prior to EUS for ampullary mass and weight loss.    Significant PMHx of HTN, COPD, Hyperlipidemia, Anxiety, BPH, PAD, Neuropathy, Rheumatoid arthritis, Breast cancer  LEVEL OF CARE:  floor PRIMARY SERVICE:  [facility]:  PCCM CODE STATUS:  Full DIET:  regular DVT Px:  heparin GI Px:  pepcid  SUBJECTIVE: Still has cough and chest congestion.  Not wheezing.  Sore in lower abdomen, and no appetite.  VITAL SIGNS: Temp:  [97.7 F (36.5 C)-98.6 F (37 C)] 97.7 F (36.5 C) (12/18 0603) Pulse Rate:  [54-87] 54  (12/18 0603) Resp:  [16-20] 16  (12/18 0603) BP: (116-133)/(68-78) 124/69 mmHg (12/18 0603) SpO2:  [95 %-98 %] 98 % (12/18 0603) FiO2 (%):  [21 %] 21 % (12/17 1554) Weight:  [88 lb 6.5 oz (40.1 kg)-110 lb (49.896 kg)] 88 lb 6.5 oz (40.1 kg) (12/18 0603)  PHYSICAL EXAMINATION: General: Thin Neuro: Normal strenght CV: s1s2 regular, no murmur PULM: scattered rhonchi, no wheeze GI: soft, non tender Extremities:  Decreased muscle mass, no edema    Lab 02/01/12 0922 01/31/12 2006 01/26/12 1807  NA 133* 133* 135  K 5.5* 4.7 5.1  CL 93* 93* 95*  CO2 30 27 28   BUN 28* 27* 25*  CREATININE 1.20 1.23 1.21  GLUCOSE 170* 147* 109*    Lab 02/01/12 0922 01/31/12 2006 01/26/12 1807  HGB 13.9 14.1 15.2  HCT 40.8 41.0 43.6  WBC 7.7 7.4 7.9  PLT 134* 148* 198     Lab 01/31/12 2006 01/26/12 1807  AST 23 26  ALT 16 17  ALKPHOS 94 100  BILITOT 0.6 0.4  PROT 7.5 7.8  ALBUMIN 4.2 4.2  INR 0.97 --   IMAGING Dg Chest 2 View  02/01/2012  *RADIOLOGY REPORT*  Clinical Data: Shortness of  breath.  CHEST - 2 VIEW  Comparison: January 26, 2012.  Findings: Cardiomediastinal silhouette appears normal. Hyperexpansion of the lungs is noted consistent with chronic obstructive pulmonary disease.  No acute pulmonary disease is noted.  IMPRESSION: No acute cardiopulmonary abnormality seen.   Original Report Authenticated By: [name],  M.D.     CARDIAC    Lab 02/01/12 0922 02/01/12 0159 01/31/12 2007  TROPONINI <0.30 <0.30 <0.30    Lab 01/31/12 2007  PROBNP 170.0    SEPSIS markers  Lab 01/31/12 2006  PROCALCITON <0.10     ASSESSMENT / PLAN:  Acute COPD exacerbation  PLAN -  Continue doxy, solumedrol, BD  4 mm Rt lower lobe pulmonary nodule PLAN- F/u as outpt  Peri-ampullary lesion with biliary ductal dilation Abnormal weight loss  PLAN -  Will need to defer EUS until respiratory status improved  Depression - r/t recent loss of wife  Anxiety  PLAN -  Psych to see  Per [facility]   [name], MD [facility] Pulmonary/Critical Care 02/01/2012, 12:10 PM Pager:  [phone] After 3pm call: [phone]

## 2012-02-01 NOTE — Progress Notes (Addendum)
INITIAL NUTRITION ASSESSMENT  DOCUMENTATION CODES Per approved criteria  -Severe malnutrition in the context of chronic illness -Underweight   INTERVENTION: 1. Ensure Complete po BID, each supplement provides 350 kcal and 13 grams of protein. 2. Multivitamin 3. Pt requesting chopped meats r/t problems chewing-will downgrade diet 4. RD will continue to follow    NUTRITION DIAGNOSIS: Malnutrtion related to poor po intake  as evidenced by weight loss of 34% body weight and meeting </=75% estimated nutrition needs for >/=1 month.   Goal: Meet >/=90% estimated nutrition needs  Monitor:  PO intake, weight trends, labs  Reason for Assessment: Consult   77 y.o. male  Admitting Dx: Pancreatic mass  ASSESSMENT: Pt with general weakness, weight loss, depression r/t recent death of wife.  Pt found to have pancreatic mass, being evaluated by GI.  Weight hx shows loss from 133 lbs to 88 lbs in less then one year, 34% weight loss-severe.  Poor po intake, states he has "eaten more in the past 2 days then in the past 2 weeks." Has no appetite, mostly eats mashed potatoes and strawberry shortcake from [employer].   Pt meets criteria for severe malnutrition in the context of chronic illness 2/2 weight loss of 34% body weight in less then one year and meeting </=75% estimated nutrition needs for >/=1 month.   Height: Ht Readings from Last 1 Encounters:  02/01/12 5\' 8"  (1.727 m)    Weight: Wt Readings from Last 1 Encounters:  02/01/12 88 lb 6.5 oz (40.1 kg)    Ideal Body Weight: 70 kg   % Ideal Body Weight: 57%  Wt Readings from Last 10 Encounters:  02/01/12 88 lb 6.5 oz (40.1 kg)  01/31/12 110 lb (49.896 kg)  08/19/11 127 lb 6.4 oz (57.788 kg)  08/19/11 127 lb 6.4 oz (57.788 kg)  05/10/11 133 lb 9.6 oz (60.601 kg)    Usual Body Weight: 150-160 lbs "several years ago" More recently 130 lbs  % Usual Body Weight: 68%  BMI:  Body mass index is 13.44 kg/(m^2). Underweight    Estimated Nutritional Needs: Kcal: 1400-1600 Protein: 50-60 gm  Fluid: 1.4-1.6 L/day  Skin: intact  Diet Order: General  EDUCATION NEEDS: -No education needs identified at this time  No intake or output data in the 24 hours ending 02/01/12 1248  Last BM: PTA   Labs:   Lab 02/01/12 0922 01/31/12 2006 01/26/12 1807  NA 133* 133* 135  K 5.5* 4.7 5.1  CL 93* 93* 95*  CO2 30 27 28   BUN 28* 27* 25*  CREATININE 1.20 1.23 1.21  CALCIUM 10.0 10.3 10.6*  MG 2.4 2.4 --  PHOS 3.4 4.5 --  GLUCOSE 170* 147* 109*    CBG (last 3)  No results found for this basename: GLUCAP:3 in the last 72 hours  Scheduled Meds:   . ipratropium  0.5 mg Nebulization Q6H   And  . albuterol  2.5 mg Nebulization Q6H  . doxycycline (VIBRAMYCIN) IV  100 mg Intravenous Q12H  . heparin  5,000 Units Subcutaneous Q12H  . methylPREDNISolone (SOLU-MEDROL) injection  40 mg Intravenous Q12H  . pantoprazole  40 mg Oral Daily    Continuous Infusions:   Past Medical History  Diagnosis Date  . Hyperlipidemia   . History of colon polyps   . Spinal stenosis   . Anxiety   . BPH (benign prostatic hypertrophy)   . ED (erectile dysfunction)   . Pernicious anemia   . Osteoporosis   . Hypertension   .  Chest pain     "I've had it" (01/31/2012)   . Peripheral vascular disease     "right leg is 100% blocked; left leg has stent, still ~ 75% blocked" (01/31/2012)  . Peripheral neuropathy     "bad" (01/31/2012)  . COPD (chronic obstructive pulmonary disease)   . Emphysema   . Pneumonia 1960's    "double" (01/31/2012)  . BRBPR (bright red blood per rectum)     "don't know what it's from; had some this week" (01/31/2012)  . Cervical spondylosis   . Rheumatoid arthritis   . Arthritis     "hands and feet" (01/31/2012)  . DDD (degenerative disc disease), lumbosacral     "S1; L5" (01/31/2012)  . Chronic lower back pain   . Depression     "imagine I've got a mild depression w/what I've gone thru last  month" (01/31/2012)  . Prostate cancer 2004    "had 40 treatments of radiation" (01/31/2012)  . Breast cancer in male     "left" (01/31/2012)  . Basal cell carcinoma of nasal tip     "    Past Surgical History  Procedure Date  . Basal cell carcinoma excision     Nose x 3  . Cholecystectomy ?1990's  . Wrist fusion     Right Wrist; "3 OR's; it's fused" (01/31/2012)  . Excisional hemorrhoidectomy 1960's  . Knee surgery     "right; X 6 surgeries; went in for simple cartilage OR; ended up w/fused knee" (01/31/2012)  . Iliac artery stent 12-07-09    Stent done by Dr. Allyson Sabal  . Total hip arthroplasty 08/16/2011    Procedure: TOTAL HIP ARTHROPLASTY ANTERIOR APPROACH;  Surgeon: Kathryne Hitch, MD;  Location: [facility] OR;  Service: Orthopedics;  Laterality: Right;  Right total hip replacement  . Cervical disc surgery     "7 total; ended w/a fusion" (01/31/2012)  . Breast lumpectomy 1990's    "left" (01/31/2012)    Clarene Duke RD, LDN Pager [phone] After Hours pager [phone]

## 2012-02-01 NOTE — Progress Notes (Signed)
02/01/12 1200  Clinical Encounter Type  Visited With Patient  Visit Type Initial   Visited with the patient.  His pastor came to see the patient during my visit.  Veryl Speak

## 2012-02-01 NOTE — Progress Notes (Signed)
Clinical Social Work Department CLINICAL SOCIAL WORK PSYCHIATRY SERVICE LINE ASSESSMENT 02/01/2012  Patient:  [name]  Account:  [id number]  Admit Date:  01/31/2012  Clinical Social Worker:  Unk Lightning, LCSW  Date/Time:  02/01/2012 12:00 N Referred by:  Physician  Date referred:  02/01/2012 Reason for Referral  Behavioral Health Issues   Presenting Symptoms/Problems (In the person's/family's own words):   "I have just been feeling really anxious."   Abuse/Neglect/Trauma History (check all that apply)  Denies history   Abuse/Neglect/Trauma Comments:   Psychiatric History (check all that apply)  Denies history   Psychiatric medications:  Xanax   Current Mental Health Hospitalizations/Previous Mental Health History:   Patient reports he feels anxious and takes 1 Xanax at time to help him sleep.   Current provider:   PCP   Place and Date:   N/A   Current Medications:   sodium chloride, ALPRAZolam, ondansetron, oxyCODONE-acetaminophen                        . ipratropium  0.5 mg Nebulization Q6H   And     . albuterol  2.5 mg Nebulization Q6H  . buPROPion  150 mg Oral Daily  . doxycycline (VIBRAMYCIN) IV  100 mg Intravenous Q12H  . feeding supplement  237 mL Oral BID BM  . heparin  5,000 Units Subcutaneous Q12H  . methylPREDNISolone (SOLU-MEDROL) injection  40 mg Intravenous Q12H  . multivitamin with minerals  1 tablet Oral Daily  . pantoprazole  40 mg Oral Daily  . zolpidem  5 mg Oral QHS   Previous Impatient Admission/Date/Reason:   None reported   Emotional Health / Current Symptoms    Suicide/Self Harm  None reported   Suicide attempt in the past:   Patient reports he would never consider hurting himself   Other harmful behavior:   Psychotic/Dissociative Symptoms  None reported   Other Psychotic/Dissociative Symptoms:    Attention/Behavioral Symptoms  Within Normal Limits   Other Attention / Behavioral Symptoms:    Cognitive Impairment   Within Normal Limits   Other Cognitive Impairment:    Mood and Adjustment  Mood Congruent    Stress, Anxiety, Trauma, Any Recent Loss/Stressor  Other - See comment   Anxiety (frequency):   Phobia (specify):   Compulsive behavior (specify):   Obsessive behavior (specify):   Other:   Patient reports a strained relationship with children. Patient's wife passed away.   Substance Abuse/Use  None   SBIRT completed (please refer for detailed history):  NA  Self-reported substance use:   Patient reports he quit drinking in 1975. Patient reports he was a "weekend drunk" up until he quit drinking.   Urinary Drug Screen Completed:  N Alcohol level:    Environmental/Housing/Living Arrangement  Stable housing   Who is in the home:   Alone   Emergency contact:  Lynn-dtr   Financial  Medicare   Patient's Strengths and Goals (patient's own words):   Patient has stable housing and reports he recently quit smoking.   Clinical Social Worker's Interpretive Summary:   CSW received referral due to patient being anxious and fear of dying. CSW reviewed chart and met with patient at bedside. No visitors present.    CSW introduced myself and explained role. Patient agreeable to consult. Patient currently lives alone but was recently at [facility] for about 2 weeks. Patient reports he has had 29 surgeries total including knee, hip, neck and wrist surgeries. Patient  reports he has "not been the same since my hip surgery" which occurred about 4 months ago. Patient is concerned about weight loss and reports that MD "found something in my stomach." Patient reports he has been to the ED several times due to feeling like he cannot breathe. Patient reports that he has smoked for the past 60 years about 1.5 packs a day. Patient reports he quit smoking about a week ago. Patient reports when he is at home and feels he cannot breathe he panics and gets worried about living alone. Patient's wife has  passed away and he reports strained relationship with two children.    Patient reports he has never been diagnosed with any mental health issues but thinks that when he was in the ED last week they prescribed him an antidepressant. Patient reports he does not feel depressed but just gets sad over "everyday problems". Patient denies any SI or HI but reports he sympathizes with people who do. Patient reports he takes 1 Xanax every night before bed to help him sleep. Patient has observed a change in his eating and sleeping habits. Patient struggles with sleeping at night and lost his appetite. Patient reports he has lost 30 pounds since his hip surgery. Patient reports he does not feel like eating on most days or his stomach hurts. Patient reports that he would like a medication that would specifically help him sleep.    Patient reports that he enjoyed SNF and feels he left too early. Patient reports that at home he does not ambulate often and usually stays at home and watches tv. Patient reports he most rely on others to assist with driving and running errands. Patient is involved with church and reports strong support group. Patient reports feeling like a burden to others.    CSW and patient discussed anxiety issues and discussed patient's smoking habits. Patient later reported that he still smokes about half a cigarette a day. CSW explained how smoking and breathing problems are related. CSW also explained that nicotine withdrawal can contribute to anxiety. CSW staffed case with psych MD who recommended medication changes.    Patient was appropriate during assessment. Patient maintained eye contact and was engaged throughout session. Patient did well identifying symptoms and reflecting on triggers. Patient agreeable to medication management for sleeping and anxious feelings.   Disposition:  Recommend Psych CSW continuing to support while in hospital

## 2012-02-01 NOTE — Care Management Note (Unsigned)
    Page 1 of 2   02/03/2012     5:36:18 PM   CARE MANAGEMENT NOTE 02/03/2012  Patient:  [name], [name]   Account Number:  [id number]  Date Initiated:  02/01/2012  Documentation initiated by:  Letha Cape  Subjective/Objective Assessment:   dx pancreatic mass  admit- lives alone.     Action/Plan:   pt eval- no pt needs.   Anticipated DC Date:  02/05/2012   Anticipated DC Plan:  HOME W HOME HEALTH SERVICES      DC Planning Services  CM consult      [facility] Choice  HOME HEALTH   Choice offered to / List presented to:  C-1 Patient        HH arranged  HH-1 RN  HH-2 PT  HH-6 SOCIAL WORKER      HH agency  [facility]   Status of service:  In process, will continue to follow Medicare Important Message given?   (If response is "NO", the following Medicare IM given date fields will be blank) Date Medicare IM given:   Date Additional Medicare IM given:    Discharge Disposition:  HOME/SELF CARE  Per UR Regulation:    If discussed at Long Length of Stay Meetings, dates discussed:    Comments:  02/03/12 17:24 Letha Cape RN, BSN [phone] patient chose [facility] from agency list, for [facility], PT, CSW, referral made to [facility], Lupita Leash notified.  Soc will begin 24-48 hrs post discharge. MD will need to put these orders in.  02/01/12 11:31 Letha Cape RN, BSN [phone] patient lives alone, per physical therapy , patient has no pt needs.  Patient has medication coverage and transportation.

## 2012-02-01 NOTE — Progress Notes (Signed)
Pt refuses to use bed alarm. Pt walks well just a little unsteady at times

## 2012-02-02 ENCOUNTER — Inpatient Hospital Stay (HOSPITAL_COMMUNITY): Payer: Medicare Other

## 2012-02-02 LAB — BASIC METABOLIC PANEL
BUN: 22 mg/dL (ref 6–23)
BUN: 22 mg/dL (ref 6–23)
CO2: 32 mEq/L (ref 19–32)
CO2: 31 mEq/L (ref 19–32)
Chloride: 95 mEq/L — ABNORMAL LOW (ref 96–112)
Chloride: 96 mEq/L (ref 96–112)
Glucose, Bld: 108 mg/dL — ABNORMAL HIGH (ref 70–99)
Glucose, Bld: 129 mg/dL — ABNORMAL HIGH (ref 70–99)
Glucose, Bld: 209 mg/dL — ABNORMAL HIGH (ref 70–99)
Potassium: 6.3 mEq/L (ref 3.5–5.1)
Potassium: 5.6 mEq/L — ABNORMAL HIGH (ref 3.5–5.1)
Potassium: 5.7 mEq/L — ABNORMAL HIGH (ref 3.5–5.1)
Sodium: 136 mEq/L (ref 135–145)

## 2012-02-02 LAB — URINE CULTURE

## 2012-02-02 MED ORDER — IOHEXOL 300 MG/ML  SOLN
75.0000 mL | Freq: Once | INTRAMUSCULAR | Status: AC | PRN
  Administered 2012-02-02: 75 mL via INTRAVENOUS

## 2012-02-02 MED ORDER — DEXTROSE 50 % IV SOLN
INTRAVENOUS | Status: AC
  Administered 2012-02-02: 50 mL
  Filled 2012-02-02: qty 50

## 2012-02-02 MED ORDER — SODIUM POLYSTYRENE SULFONATE 15 GM/60ML PO SUSP
30.0000 g | Freq: Once | ORAL | Status: AC
  Administered 2012-02-02: 30 g via ORAL
  Filled 2012-02-02: qty 120

## 2012-02-02 MED ORDER — OXYCODONE HCL 5 MG PO TABS
10.0000 mg | ORAL_TABLET | ORAL | Status: DC | PRN
  Administered 2012-02-02 – 2012-02-03 (×5): 10 mg via ORAL
  Filled 2012-02-02 (×5): qty 2

## 2012-02-02 MED ORDER — SENNA 8.6 MG PO TABS
1.0000 | ORAL_TABLET | Freq: Every day | ORAL | Status: DC
  Administered 2012-02-03 – 2012-02-05 (×2): 8.6 mg via ORAL
  Filled 2012-02-02 (×5): qty 1

## 2012-02-02 MED ORDER — SODIUM POLYSTYRENE SULFONATE 15 GM/60ML PO SUSP
30.0000 g | Freq: Once | ORAL | Status: DC
  Filled 2012-02-02: qty 120

## 2012-02-02 MED ORDER — NICOTINE 21 MG/24HR TD PT24
21.0000 mg | MEDICATED_PATCH | Freq: Every day | TRANSDERMAL | Status: DC
  Filled 2012-02-02: qty 1

## 2012-02-02 MED ORDER — SODIUM BICARBONATE 8.4 % IV SOLN
INTRAVENOUS | Status: DC
  Administered 2012-02-02: 21:00:00 via INTRAVENOUS
  Filled 2012-02-02 (×4): qty 100

## 2012-02-02 MED ORDER — SODIUM CHLORIDE 0.9 % IV SOLN: 250.0000 mL | INTRAVENOUS | Status: DC | PRN

## 2012-02-02 MED ORDER — DOXYCYCLINE HYCLATE 100 MG PO TABS
100.0000 mg | ORAL_TABLET | Freq: Two times a day (BID) | ORAL | Status: DC
  Administered 2012-02-02 – 2012-02-06 (×8): 100 mg via ORAL
  Filled 2012-02-02 (×10): qty 1

## 2012-02-02 MED ORDER — NICOTINE 14 MG/24HR TD PT24
14.0000 mg | MEDICATED_PATCH | Freq: Every day | TRANSDERMAL | Status: DC
  Administered 2012-02-02 – 2012-02-06 (×5): 14 mg via TRANSDERMAL
  Filled 2012-02-02 (×5): qty 1

## 2012-02-02 MED ORDER — INSULIN ASPART 100 UNIT/ML IV SOLN
6.0000 [IU] | Freq: Once | INTRAVENOUS | Status: AC
  Administered 2012-02-02: 6 [IU] via INTRAVENOUS

## 2012-02-02 MED ORDER — TRAZODONE HCL 100 MG PO TABS
100.0000 mg | ORAL_TABLET | Freq: Every day | ORAL | Status: DC
  Administered 2012-02-02 – 2012-02-05 (×4): 100 mg via ORAL
  Filled 2012-02-02 (×5): qty 1

## 2012-02-02 MED ORDER — POLYETHYLENE GLYCOL 3350 17 G PO PACK
17.0000 g | PACK | Freq: Two times a day (BID) | ORAL | Status: DC
  Administered 2012-02-02 – 2012-02-06 (×8): 17 g via ORAL
  Filled 2012-02-02 (×11): qty 1

## 2012-02-02 MED ORDER — DEXTROSE 50 % IV SOLN: 1.0000 | Freq: Once | INTRAVENOUS | Status: AC

## 2012-02-02 MED ORDER — SODIUM BICARBONATE 8.4 % IV SOLN
INTRAVENOUS | Status: DC
  Filled 2012-02-02: qty 1000

## 2012-02-02 MED ORDER — SODIUM CHLORIDE 0.9 % IV SOLN
INTRAVENOUS | Status: DC
  Administered 2012-02-02: 14:00:00 via INTRAVENOUS

## 2012-02-02 MED ORDER — SODIUM POLYSTYRENE SULFONATE 15 GM/60ML PO SUSP
45.0000 g | Freq: Once | ORAL | Status: AC
  Administered 2012-02-02: 45 g via ORAL
  Filled 2012-02-02: qty 180

## 2012-02-02 MED ORDER — PREDNISONE 20 MG PO TABS
40.0000 mg | ORAL_TABLET | Freq: Every day | ORAL | Status: DC
  Administered 2012-02-03: 40 mg via ORAL
  Filled 2012-02-02 (×2): qty 2

## 2012-02-02 MED ORDER — OXYCODONE HCL 5 MG PO TABS
20.0000 mg | ORAL_TABLET | ORAL | Status: DC | PRN
  Administered 2012-02-02: 20 mg via ORAL
  Filled 2012-02-02: qty 4

## 2012-02-02 MED ORDER — BUSPIRONE HCL 10 MG PO TABS
10.0000 mg | ORAL_TABLET | Freq: Three times a day (TID) | ORAL | Status: DC
  Administered 2012-02-02 (×2): 10 mg via ORAL
  Filled 2012-02-02 (×6): qty 1

## 2012-02-02 MED ORDER — OXYCODONE HCL ER 15 MG PO T12A
15.0000 mg | EXTENDED_RELEASE_TABLET | Freq: Two times a day (BID) | ORAL | Status: DC
  Administered 2012-02-02 – 2012-02-03 (×3): 15 mg via ORAL
  Filled 2012-02-02 (×3): qty 1

## 2012-02-02 NOTE — Progress Notes (Addendum)
PULMONARY  / CRITICAL CARE MEDICINE  Name: [name] MRN: [id number] DOB: [birth year]    LOS: 2 Date of admit 01/31/2012  6:33 PM   REFERRING PROVIDER:  Dr [name] and PCP Charles Lance, MD  CHIEF COMPLAINT:  Multiple  Brief HX:  77 yo male smoker admitted from pulmonary office 01/31/2012 with AECOPD.  Initial pulmonary evaluation requested for pre-op assessment prior to EUS for ampullary mass and weight loss.    Significant PMHx of HTN, COPD, Hyperlipidemia, Anxiety, BPH, PAD, Neuropathy, Rheumatoid arthritis, Breast cancer  LEVEL OF CARE:  floor PRIMARY SERVICE:  [facility]ANT[facility]:  PCCM CODE STATUS:  Full DIET:  regular DVT Px:  heparin GI Px:  pepcid  SUBJECTIVE: Still has cough and chest congestion but breathing much better.  On RA.  No wheeze.   VITAL SIGNS: Temp:  [97.5 F (36.4 C)-98.2 F (36.8 C)] 97.5 F (36.4 C) (12/19 0500) Pulse Rate:  [60-93] 60  (12/19 0500) Resp:  [10-18] 10  (12/19 0500) BP: (109-161)/(69-81) 142/72 mmHg (12/19 0713) SpO2:  [95 %-97 %] 97 % (12/19 0500) Weight:  [89 lb 8.1 oz (40.6 kg)] 89 lb 8.1 oz (40.6 kg) (12/19 0500)  PHYSICAL EXAMINATION: General: Thin Neuro: awake, alert, appropriate, Normal strength CV: s1s2 regular, no murmur PULM: resps even non labored, few scattered rhonchi, no wheeze GI: soft, non tender Extremities:  Decreased muscle mass, no edema    Lab 02/02/12 0758 02/01/12 0922 01/31/12 2006  NA 136 133* 133*  K 6.3* 5.5* 4.7  CL 97 93* 93*  CO2 32 30 27  BUN 24* 28* 27*  CREATININE 1.09 1.20 1.23  GLUCOSE 108* 170* 147*    Lab 02/01/12 0922 01/31/12 2006 01/26/12 1807  HGB 13.9 14.1 15.2  HCT 40.8 41.0 43.6  WBC 7.7 7.4 7.9  PLT 134* 148* 198     Lab 01/31/12 2006 01/26/12 1807  AST 23 26  ALT 16 17  ALKPHOS 94 100  BILITOT 0.6 0.4  PROT 7.5 7.8  ALBUMIN 4.2 4.2  INR 0.97 --   IMAGING Dg Chest 2 View  02/01/2012  *RADIOLOGY REPORT*  Clinical Data: Shortness of breath.   CHEST - 2 VIEW  Comparison: January 26, 2012.  Findings: Cardiomediastinal silhouette appears normal. Hyperexpansion of the lungs is noted consistent with chronic obstructive pulmonary disease.  No acute pulmonary disease is noted.  IMPRESSION: No acute cardiopulmonary abnormality seen.   Original Report Authenticated By: [name].,  M.D.    Ct Chest W Contrast  02/02/2012  *RADIOLOGY REPORT*  Clinical Data: Cough.  Chest pain.  Shortness of breath.  Personal history of prostate and skin cancer.  CT CHEST WITH CONTRAST  Technique:  Multidetector CT imaging of the chest was performed following the standard protocol during bolus administration of intravenous contrast.  Contrast: 75mL OMNIPAQUE IOHEXOL 300 MG/ML  SOLN  Comparison: None.  Findings: Severe pulmonary emphysema is noted.  No suspicious pulmonary nodules or masses are identified.  There is no evidence of pulmonary infiltrate or central endobronchial obstruction.  No evidence of pleural or pericardial effusion.  No evidence of mediastinal or hilar masses.  No adenopathy seen elsewhere within the thorax.  No suspicious bone lesions identified.  The adrenal glands are normal in appearance.  IMPRESSION:  Severe pulmonary emphysema.  No active disease.   Original Report Authenticated By: [name], M.D.      CARDIAC    Lab 02/01/12 0922 02/01/12 0159 01/31/12 2007  TROPONINI <0.30 <0.30 <  0.30    Lab 01/31/12 2007  PROBNP 170.0    SEPSIS markers  Lab 01/31/12 2006  PROCALCITON <0.10     ASSESSMENT / PLAN:  Acute COPD exacerbation with emphysema PLAN -  Continue doxy, BD Change solumedrol to PO pred with taper  Resume advair on d/c   Hyperkalemia, Acute renal failure --  Kayexalate given per [facility]    4 mm Rt lower lobe pulmonary nodule PLAN- F/u as outpt  Peri-ampullary lesion with biliary ductal dilation Abnormal weight loss  PLAN -  Likely ok to proceed with EUS with improved resp status - will likely be outpt  in next week or so  Depression - r/t recent loss of wife  Anxiety  PLAN -  Seen by Psych  Per [facility]   Likely d/c home in next day or so per [facility], can f/u in pulm office   WHITEHEART,KATHRYN, NP 02/02/2012  10:34 AM Pager: [phone] or [phone]  *Care during the described time interval was provided by me and/or other providers on the critical care team. I have reviewed this patient's available data, including medical history, events of note, physical examination and test results as part of my evaluation.   Reviewed above, examined pt, and agree with assessment/plan.  Respiratory status improving.  Defer further inpt management to [facility] team.  Has decreased muscle mass, so creatinine of 1.2 with elevated K may be significant.  D/w Dr. Jerral [name].  PCCM will sign off.  Please call if additional help needed while [name] is in hospital.  Will arrange for outpt pulmonary follow up with [name] or Dr. Marchelle [name] in 2 to 3 weeks.  Charles Helling, MD [facility] Pulmonary/Critical Care 02/02/2012, 1:53 PM Pager:  [phone] After 3pm call: [phone]

## 2012-02-02 NOTE — Progress Notes (Signed)
[facility] PROGRESS NOTE  [name] ZOX:[id number] DOB: [birth year] DOA: 01/31/2012 PCP: Thora Lance, MD  Assessment/Plan:  Abdominal pain / Weight Loss / Pancreatic Mass Ampullary mass being evaluated by [facility].  Pulmonary Clearance given for endoscopic procedure-EUS Weight loss 150 -> 110 over the course of several months.  Unable to eat.  Nutrition consult completed. Add Long acting oxycontin 15 mg BID, and use 10 mg of Oxycodone for breakthrough pain  Acute on COPD Appreciate pulmonary recommendations. Nebs/Steroids/doxycycline - changing steroids and antibiotics to PO. Lungs with improved rhonchi.  No wheeze Obtaining Chest CT.  Requested by Dr. Dulce Sellar to rule out lung cancer.  Hyperkalemia Etiology uncertain-?? From steroids K++ climbed from 4.7 on 12/17 to 6.3 on 12/19 without K++ supplementation. Given kayexalate, d50 and insulin.  Recheck bmet and check CK this afternoon.  Anxiety / Depression Psychiatry consulted. Patient with anxiety.  Recent loss of his wife.  Feelings of impending death. Started on wellbutrin, buspar, and trazodone for anxiety and insomnia  Chronic Pain Severe PVD with chronic occlusion of right lower extremity (PT eval completed) Cervical spondylosis DDD L5 - S1 Continue with as needed Percocet  GERD Continue PPI  HIstory of Prostate Ca/Breast Ca Quiet.  No issues currently  Tobacco abuse 1/2 PPD.  Nicotine patch placed.  DVT prophylaxis Prophylactic  heparin   Code Status: full Family Communication:  Disposition Plan: to home when appropriate. (possibly 12/19)   Consultants:  PCCM:  Dr. Marchelle Gearing  Psych:  Dr. Ferol Luz  Procedures:    Antibiotics:  Doxycycline  HPI/Subjective: Complaining of pain in his right foot and leg.  Objective: Filed Vitals:   02/01/12 1443 02/01/12 2100 02/02/12 0500 02/02/12 0713  BP: 109/69 160/70 161/81 142/72  Pulse: 93 80 60   Temp: 97.9 F (36.6 C) 98.2  F (36.8 C) 97.5 F (36.4 C)   TempSrc: Oral Oral Oral   Resp: 18 14 10    Height:      Weight:   40.6 kg (89 lb 8.1 oz)   SpO2: 95% 97% 97%     Intake/Output Summary (Last 24 hours) at 02/02/12 1241 Last data filed at 02/02/12 0858  Gross per 24 hour  Intake   1020 ml  Output      0 ml  Net   1020 ml   Filed Weights   02/01/12 0500 02/01/12 0603 02/02/12 0500  Weight: 40.1 kg (88 lb 6.5 oz) 40.1 kg (88 lb 6.5 oz) 40.6 kg (89 lb 8.1 oz)    Exam:   General:  A&O, Sitting up in bed, Thin, non-toxic, pleasant  Cardiovascular: RRR, no M/R/G  Respiratory:  No accessory muscle use. BS improving.  Still with some rhonchi.  Abdomen: Thin, + BS, non distended, no masses  Musculo-skeletal:  Able to move all 4 extremities  Psych:  A&O, Cooperative, Well Groomed.  Becomes tearful when talking about being home alone.  Data Reviewed: Basic Metabolic Panel:  Lab 02/02/12 8119 02/01/12 0922 01/31/12 2006 01/26/12 1807  NA 136 133* 133* 135  K 6.3* 5.5* 4.7 5.1  CL 97 93* 93* 95*  CO2 32 30 27 28   GLUCOSE 108* 170* 147* 109*  BUN 24* 28* 27* 25*  CREATININE 1.09 1.20 1.23 1.21  CALCIUM 10.1 10.0 10.3 10.6*  MG -- 2.4 2.4 --  PHOS -- 3.4 4.5 --   Liver Function Tests:  Lab 01/31/12 2006 01/26/12 1807  AST 23 26  ALT 16 17  ALKPHOS 94 100  BILITOT  0.6 0.4  PROT 7.5 7.8  ALBUMIN 4.2 4.2    Lab 01/31/12 2006 01/26/12 1807  LIPASE 17 16  AMYLASE 66 --   CBC:  Lab 02/01/12 0922 01/31/12 2006 01/26/12 1807  WBC 7.7 7.4 7.9  NEUTROABS -- 6.5 6.7  HGB 13.9 14.1 15.2  HCT 40.8 41.0 43.6  MCV 90.9 90.1 90.1  PLT 134* 148* 198   Cardiac Enzymes:  Lab 02/01/12 0922 02/01/12 0159 01/31/12 2007  CKTOTAL -- -- --  CKMB -- -- --  CKMBINDEX -- -- --  TROPONINI <0.30 <0.30 <0.30   BNP (last 3 results)  Basename 01/31/12 2007  PROBNP 170.0   Studies: Dg Chest 2 View  02/01/2012  *RADIOLOGY REPORT*  Clinical Data: Shortness of breath.  CHEST - 2 VIEW  Comparison:  January 26, 2012.  Findings: Cardiomediastinal silhouette appears normal. Hyperexpansion of the lungs is noted consistent with chronic obstructive pulmonary disease.  No acute pulmonary disease is noted.  IMPRESSION: No acute cardiopulmonary abnormality seen.   Original Report Authenticated By: [name],  M.D.     Scheduled Meds:    . ipratropium  0.5 mg Nebulization Q6H   And  . albuterol  2.5 mg Nebulization Q6H  . buPROPion  150 mg Oral Daily  . busPIRone  10 mg Oral TID  . doxycycline  100 mg Oral Q12H  . feeding supplement  237 mL Oral BID BM  . heparin  5,000 Units Subcutaneous Q12H  . multivitamin with minerals  1 tablet Oral Daily  . nicotine  14 mg Transdermal Daily  . OxyCODONE  15 mg Oral Q12H  . pantoprazole  40 mg Oral Daily  . polyethylene glycol  17 g Oral BID  . predniSONE  40 mg Oral Q breakfast  . senna  1 tablet Oral QHS  . traZODone  100 mg Oral QHS  . zolpidem  5 mg Oral QHS   Continuous Infusions:   Principal Problem:  *Pancreatic mass Active Problems:  Chronic total occlusion of artery of the extremities  Depression  Failure to thrive  Cachexia  COPD exacerbation  Weight loss    Time spent: 35 min.    Conley Canal  [facility] Pager [phone]. If 8PM-8AM, please contact night-coverage at [email or web address], password [facility] 02/02/2012, 12:41 PM  LOS: 2 days     Attending Patient seen and examined, agree with above assessment and plan. Breathing better, still with significant abdominal pain-will start long acting narcotics-and use oxy IR for break through pain. Appreciate PCCM and Psych input.  [name]

## 2012-02-02 NOTE — Progress Notes (Signed)
PT lab results showed: GRAM POSITIVE COCCI IN CLUSTERS . RN paged MD  On call to make aware. RN will await further orders. -Judy Pimple, RN

## 2012-02-02 NOTE — Consult Note (Signed)
Patient Identification:  [name] Date of Evaluation:  02/02/2012 Reason for Consult:  Depression, Anxiety  Referring Provider: Dr. Darrold [name], [place]  History of Present Illness: pt is admitted for acute exacerbation of COPD.  Symptoms worsened over ~ 2 wks. until he became nauseated.  He began to fear going to sleep with fear of not waking up.  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     Past Psychiatric History:  He recently spent 2 wks at [facility]. He drinks beer -6 pk; Whiskey, White Russians. He began drinking at age 70 He has smoked cigarettes for 60 years 1 1/2 pks/day. Now he smokes 1/2 cigarette a day. He has to sleep in a recliner at home ~ 3 hrs daily; no daily naps. He takes Xanax for sleep He has had thoughts of suicide, no action; He wants his RoxyCodon 30 mg 3 X day.  Past Medical History:     Past Medical History  Diagnosis Date  . Hyperlipidemia   . History of colon polyps   . Spinal stenosis   . Anxiety   . BPH (benign prostatic hypertrophy)   . ED (erectile dysfunction)   . Pernicious anemia   . Osteoporosis   . Hypertension   . Chest pain     "I've had it" (01/31/2012)   . Peripheral vascular disease     "right leg is 100% blocked; left leg has stent, still ~ 75% blocked" (01/31/2012)  . Peripheral neuropathy     "bad" (01/31/2012)  . COPD (chronic obstructive pulmonary disease)   . Emphysema   . Pneumonia 1960's    "double" (01/31/2012)  . BRBPR (bright red blood per rectum)     "don't know what it's from; had some this week" (01/31/2012)  . Cervical spondylosis   . Rheumatoid arthritis   . Arthritis     "hands and feet" (01/31/2012)  . DDD (degenerative disc disease), lumbosacral     "S1; L5" (01/31/2012)  . Chronic lower back pain   . Depression     "imagine I've got a mild depression w/what I've gone thru last month" (01/31/2012)  . Prostate cancer 2004     "had 40 treatments of radiation" (01/31/2012)  . Breast cancer in male     "left" (01/31/2012)  . Basal cell carcinoma of nasal tip     "       Past Surgical History  Procedure Date  . Basal cell carcinoma excision     Nose x 3  . Cholecystectomy ?1990's  . Wrist fusion     Right Wrist; "3 OR's; it's fused" (01/31/2012)  . Excisional hemorrhoidectomy 1960's  . Knee surgery     "right; X 6 surgeries; went in for simple cartilage OR; ended up w/fused knee" (01/31/2012)  . Iliac artery stent 12-07-09    Stent done by Dr. Allyson [name]  . Total hip arthroplasty 08/16/2011    Procedure: TOTAL HIP ARTHROPLASTY ANTERIOR APPROACH;  Surgeon: Charles Hitch, MD;  [name];  Service: Orthopedics;  Laterality: Right;  Right total hip replacement  . Cervical disc surgery     "7 total; ended w/a fusion" (01/31/2012)  . Breast lumpectomy 1990's    "left" (01/31/2012)    Allergies:  Allergies  Allergen Reactions  . Lipitor (Atorvastatin Calcium) Other (See Comments)    Myalgias "don't remember how bad" (01/31/2012)  .  Tiotropium Bromide Monohydrate Other (See Comments)    hoarseness    Current Medications:  Prior to Admission medications   Medication Sig Start Date End Date Taking? Authorizing Provider  albuterol (PROVENTIL) (2.5 MG/3ML) 0.083% nebulizer solution Take 2.5 mg by nebulization every 6 (six) hours as needed. For wheezing   Yes Historical Provider, MD  ALPRAZolam Prudy Feeler) 1 MG tablet Take 1 mg by mouth at bedtime. 08/19/11  Yes Charles Jenna Luo, MD  famotidine (PEPCID) 20 MG tablet Take 1 tablet (20 mg total) by mouth 2 (two) times daily. 01/26/12  Yes Charles Razor, MD  feeding supplement (ENSURE COMPLETE) LIQD Take 237 mLs by mouth 2 (two) times daily between meals. 08/19/11  Yes Charles Jenna Luo, MD  Fluticasone-Salmeterol (ADVAIR DISKUS) 250-50 MCG/DOSE AEPB Inhale 1 puff into the lungs every 12 (twelve) hours.   Yes Historical Provider, MD  Multiple Vitamin  (MULTIVITAMIN) tablet Take 1 tablet by mouth daily.   Yes Historical Provider, MD  oxyCODONE-acetaminophen (PERCOCET) 5-325 MG per tablet Take 1 tablet by mouth every 6 (six) hours. For pain. 08/19/11  Yes Charles Jenna Luo, MD  predniSONE (DELTASONE) 20 MG tablet Take 2 tablets (40 mg total) by mouth daily. 01/26/12  Yes Charles Razor, MD  rosuvastatin (CRESTOR) 20 MG tablet Take 20 mg by mouth every morning.    Yes Historical Provider, MD    Social History:    reports that he has been smoking Cigarettes.  He has a 60 pack-year smoking history. He has never used smokeless tobacco. He reports that he does not drink alcohol or use illicit drugs.   Family History:    Family History  Problem Relation Age of Onset  . Hypertension Mother   . Cancer Mother     colon cancer    Mental Status Examination/Evaluation: Objective:  Appearance: Meticulous  Eye Contact::  Good  Speech:  Clear and Coherent and Normal Rate  Volume:  Increased  Mood:  anxious  Affect:  Congruent  Thought Process:  Goal Directed  Orientation:  Full (Time, Place, and Person)  Thought Content:  Rumination and perseverates about needing to stay for the weekend.  He 'is not ready to leave"  Suicidal Thoughts:  No  Homicidal Thoughts:  No  Judgement:  Fair  Insight:  Lacking   DIAGNOSIS:   AXIS I  Anxiety Disorder with depressed mood  AXIS II  Deferred  AXIS III See medical notes.  AXIS IV economic problems, housing problems, occupational problems, other psychosocial or environmental problems and problems related to social environment  Substance dependence serious weight loss undiagnosed panccreatic mass  AXIS V 51-60 moderate symptoms   Assessment/Plan: Discussed with Charles Downs, PA Pt is very anxious, dreading to leave hospital.  He is medically stable.  He says he plans to stop smoking after this episode "cold [place]".  It is likely that he is in nicotine withdrawal and is so anxious.  Use of the Nicoderm patch  will enable him to withdraw from nicotine in a more controlled manner and minimize withdrawal effects.   Pt agrees.  Abd. Mass eval pending. RECOMMENDATION:  1.  Pt has pulmonary symptoms resolved.  2.  Pt needs referral to rehabilitation centers, AA 3.  Suggest Nicoderm Patch 14 mg daily to reduce anxiety with nicotine withdrawal 4.  No further psychiatric needs.  MD Psychiatrist signs off.  Mickeal Skinner MD 02/02/2012 1:04 PM

## 2012-02-02 NOTE — Progress Notes (Signed)
CRITICAL VALUE ALERT  Critical value received:  Potassium 6.3 (not hemolyzed)  Date of notification:  02/02/12  Time of notification:  1002  Critical value read back: YES  Nurse who received alert:  L. Margaretha Sheffield, RN  MD notified (1st page): Ghimire/York  Time of first page: 1002  MD notified (2nd page):  Time of second page:  Responding MD:  Dina Rich  Time MD responded:  1003

## 2012-02-02 NOTE — Progress Notes (Signed)
Pt vomited around 2200. Asked if he wanted something for nausea- pt replied that it is not so much his stomach, more like a ball that is sitting in his throat.

## 2012-02-02 NOTE — Progress Notes (Signed)
Clinical Social Work Progress Note PSYCHIATRY SERVICE LINE 02/02/2012  Patient:  [name]  Account:  [id number]  Admit Date:  01/31/2012  Clinical Social Worker:  Unk Lightning, LCSW  Date/Time:  02/02/2012 11:45 AM  Review of Patient  Overall Medical Condition:   Patient reports that his stomach is still hurting but feeling better overall.   Participation Level:  Active  Participation Quality  Appropriate   Other Participation Quality:   Affect  Appropriate   Cognitive  Appropriate   Reaction to Medications/Concerns:   Patient reports psych MD placed him on different medications and he is anxious to see if they will be helpful.   Modes of Intervention  Support   Summary of Progress/Plan at Discharge   CSW met with patient at bedside. Patient remembered CSW and agreeable to session today. Patient reports he feels anxious about going home. CSW discussed options with patient. Insurance will not cover SNF. Patient reports that dtr has checked in ALF PTA but patient cannot afford to pay privately. Patient reports he cannot stay with children because they do not have enough room at their house.    Patient and CSW discussed current support system. Patient reports that he is very involved with church and lives less than a mile from church. Patient reports it is easy to get rides to church or drive there due to the short distance. Patient reports he will try and become move involved with services offered. CSW spoke with patient regarding outpatient counseling. Patient reports that he cannot afford the extra expense and does not feel it would be beneficial. CSW encouraged patient to contact CSW if he changed his mind.    Patient was appropriate during session. Patient has concerns and feels anxious regarding returning home. Patient has support at home and has no safety concerns regarding returning home but will just feel lonely. CSW is signing off but available if needed.

## 2012-02-03 DIAGNOSIS — R1010 Upper abdominal pain, unspecified: Secondary | ICD-10-CM

## 2012-02-03 DIAGNOSIS — R109 Unspecified abdominal pain: Secondary | ICD-10-CM

## 2012-02-03 LAB — COMPREHENSIVE METABOLIC PANEL
AST: 15 U/L (ref 0–37)
BUN: 15 mg/dL (ref 6–23)
CO2: 36 mEq/L — ABNORMAL HIGH (ref 19–32)
Chloride: 98 mEq/L (ref 96–112)
Creatinine, Ser: 0.97 mg/dL (ref 0.50–1.35)
GFR calc non Af Amer: 79 mL/min — ABNORMAL LOW (ref >90)
Total Bilirubin: 0.4 mg/dL (ref 0.3–1.2)

## 2012-02-03 LAB — CULTURE, BLOOD (ROUTINE X 2)

## 2012-02-03 LAB — CBC
MCH: 30.8 pg (ref 26.0–34.0)
Platelets: 121 10*3/uL — ABNORMAL LOW (ref 150–400)
RBC: 4.22 MIL/uL (ref 4.22–5.81)
RDW: 15.4 % (ref 11.5–15.5)
WBC: 8 10*3/uL (ref 4.0–10.5)

## 2012-02-03 MED ORDER — PREDNISONE 20 MG PO TABS
40.0000 mg | ORAL_TABLET | Freq: Every day | ORAL | Status: DC
  Administered 2012-02-04 – 2012-02-06 (×3): 40 mg via ORAL
  Filled 2012-02-03 (×4): qty 2

## 2012-02-03 MED ORDER — HYDROXYZINE HCL 25 MG PO TABS
25.0000 mg | ORAL_TABLET | Freq: Two times a day (BID) | ORAL | Status: DC
  Administered 2012-02-03 – 2012-02-06 (×7): 25 mg via ORAL
  Filled 2012-02-03 (×9): qty 1

## 2012-02-03 MED ORDER — MORPHINE SULFATE 15 MG PO TABS
7.5000 mg | ORAL_TABLET | ORAL | Status: DC | PRN
  Administered 2012-02-03 – 2012-02-06 (×9): 7.5 mg via ORAL
  Filled 2012-02-03 (×2): qty 1
  Filled 2012-02-03: qty 2
  Filled 2012-02-03 (×6): qty 1

## 2012-02-03 MED ORDER — MORPHINE SULFATE ER 30 MG PO TBCR
30.0000 mg | EXTENDED_RELEASE_TABLET | Freq: Two times a day (BID) | ORAL | Status: DC
  Administered 2012-02-03 – 2012-02-05 (×4): 30 mg via ORAL
  Filled 2012-02-03 (×4): qty 1

## 2012-02-03 MED ORDER — PREDNISONE 20 MG PO TABS
30.0000 mg | ORAL_TABLET | Freq: Every day | ORAL | Status: DC
  Filled 2012-02-03: qty 1

## 2012-02-03 MED ORDER — MORPHINE SULFATE 15 MG PO TABS: 15.0000 mg | ORAL_TABLET | ORAL | Status: DC | PRN

## 2012-02-03 MED ORDER — ESCITALOPRAM OXALATE 10 MG PO TABS
10.0000 mg | ORAL_TABLET | Freq: Every day | ORAL | Status: DC
  Administered 2012-02-03 – 2012-02-06 (×4): 10 mg via ORAL
  Filled 2012-02-03 (×4): qty 1

## 2012-02-03 MED ORDER — HYDROMORPHONE HCL PF 1 MG/ML IJ SOLN: 1.0000 mg | INTRAMUSCULAR | Status: DC | PRN

## 2012-02-03 NOTE — Progress Notes (Signed)
Progress Note s/p consultation Patient Identification:  [name] Date of Evaluation:  02/03/2012 Reason for Consult: Failure to thrive  Referring Provider: Stormy Card, [place], Dr. Jerral Ralph  History of Present Illness:PA [name] calls to inform pt had an elevated K+, he was given K exylate and Potassium was normalized.  He began vomiting "5 times" and was unable to sleep.  He says he was also nauseous  In am.  At time of meeting, he says he has aches but has been able to eat bites of breakfast and now is trying to eat some lunch.    Past Psychiatric History:  He recently spent 2 wks at [facility]. He drinks beer -6 pk; Whiskey, White Russians. He began drinking at age 49 He has smoked cigarettes for 60 years 1 1/2 pks/day. Now he smokes 1/2 cigarette a day. He has to sleep in a recliner at home ~ 3 hrs daily; no daily naps. He takes Xanax for sleep He has had thoughts of suicide, no action; He wants his RoxyCodon 30 mg 3 X day.  Past Medical History:     Past Medical History  Diagnosis Date  . Hyperlipidemia   . History of colon polyps   . Spinal stenosis   . Anxiety   . BPH (benign prostatic hypertrophy)   . ED (erectile dysfunction)   . Pernicious anemia   . Osteoporosis   . Hypertension   . Chest pain     "I've had it" (01/31/2012)   . Peripheral vascular disease     "right leg is 100% blocked; left leg has stent, still ~ 75% blocked" (01/31/2012)  . Peripheral neuropathy     "bad" (01/31/2012)  . COPD (chronic obstructive pulmonary disease)   . Emphysema   . Pneumonia 1960's    "double" (01/31/2012)  . BRBPR (bright red blood per rectum)     "don't know what it's from; had some this week" (01/31/2012)  . Cervical spondylosis   . Rheumatoid arthritis   . Arthritis     "hands and feet" (01/31/2012)  . DDD (degenerative disc disease), lumbosacral     "S1; L5" (01/31/2012)  . Chronic lower back pain   . Depression     "imagine I've got a mild depression w/what I've  gone thru last month" (01/31/2012)  . Prostate cancer 2004    "had 40 treatments of radiation" (01/31/2012)  . Breast cancer in male     "left" (01/31/2012)  . Basal cell carcinoma of nasal tip     "       Past Surgical History  Procedure Date  . Basal cell carcinoma excision     Nose x 3  . Cholecystectomy ?1990's  . Wrist fusion     Right Wrist; "3 OR's; it's fused" (01/31/2012)  . Excisional hemorrhoidectomy 1960's  . Knee surgery     "right; X 6 surgeries; went in for simple cartilage OR; ended up w/fused knee" (01/31/2012)  . Iliac artery stent 12-07-09    Stent done by Dr. Allyson Sabal  . Total hip arthroplasty 08/16/2011    Procedure: TOTAL HIP ARTHROPLASTY ANTERIOR APPROACH;  Surgeon: Kathryne Hitch, MD;  Location: [facility] OR;  Service: Orthopedics;  Laterality: Right;  Right total hip replacement  . Cervical disc surgery     "7 total; ended w/a fusion" (01/31/2012)  . Breast lumpectomy 1990's    "left" (01/31/2012)    Allergies:  Allergies  Allergen Reactions  . Lipitor (Atorvastatin Calcium) Other (  See Comments)    Myalgias "don't remember how bad" (01/31/2012)  . Tiotropium Bromide Monohydrate Other (See Comments)    hoarseness    Current Medications:  Prior to Admission medications   Medication Sig Start Date End Date Taking? Authorizing Provider  albuterol (PROVENTIL) (2.5 MG/3ML) 0.083% nebulizer solution Take 2.5 mg by nebulization every 6 (six) hours as needed. For wheezing   Yes Historical Provider, MD  ALPRAZolam Prudy Feeler) 1 MG tablet Take 1 mg by mouth at bedtime. 08/19/11  Yes Ripudeep Jenna Luo, MD  famotidine (PEPCID) 20 MG tablet Take 1 tablet (20 mg total) by mouth 2 (two) times daily. 01/26/12  Yes Raeford Razor, MD  feeding supplement (ENSURE COMPLETE) LIQD Take 237 mLs by mouth 2 (two) times daily between meals. 08/19/11  Yes Ripudeep Jenna Luo, MD  Fluticasone-Salmeterol (ADVAIR DISKUS) 250-50 MCG/DOSE AEPB Inhale 1 puff into the lungs every 12 (twelve) hours.    Yes Historical Provider, MD  Multiple Vitamin (MULTIVITAMIN) tablet Take 1 tablet by mouth daily.   Yes Historical Provider, MD  oxyCODONE-acetaminophen (PERCOCET) 5-325 MG per tablet Take 1 tablet by mouth every 6 (six) hours. For pain. 08/19/11  Yes Ripudeep Jenna Luo, MD  predniSONE (DELTASONE) 20 MG tablet Take 2 tablets (40 mg total) by mouth daily. 01/26/12  Yes Raeford Razor, MD  rosuvastatin (CRESTOR) 20 MG tablet Take 20 mg by mouth every morning.    Yes Historical Provider, MD    Social History:    reports that he has been smoking Cigarettes.  He has a 60 pack-year smoking history. He has never used smokeless tobacco. He reports that he does not drink alcohol or use illicit drugs.   Family History:    Family History  Problem Relation Age of Onset  . Hypertension Mother   . Cancer Mother     colon cancer    Mental Status Examination/Evaluation: Objective:  Appearance: Casual, Neat and undernourished  Eye Contact::  Good  Speech:  Clear and Coherent and Normal Rate  Volume:  Normal  IMood:  Improved  Affect:  Congruent  Thought Process:  Coherent and Goal Directed  Orientation:  Full (Time, Place, and Person)  Thought Content:  self-absorbed about symptoms  Suicidal Thoughts:  No  Homicidal Thoughts:  No  Judgement:  Fair  Insight:  Lacking   DIAGNOSIS:   AXIS I   Anxiety disorder with depressed mood,  Nicotine Dependence  AXIS II  Deferred  AXIS III See medical notes.  AXIS IV economic problems, other psychosocial or environmental problems, problems related to social environment, problems with primary support group and medical problems, chronic pain, inability to tolerate distress  AXIS V 51-60 moderate symptoms   Assessment/Plan:  Discussed with [name] [place] PA Pt has mysteriously is found to have an elevated potassium.  He is treated, now stable and is noted that last potassium check is slightly below normal range.  He is believed to have an adverse reaction to  medication.  It is discontinued to avoid further potassium changes.  Pt is more comfortable, no nausea and has recovered some appetite.  He agrees to nicotine patch which is expedted reduce his level of anxiety.  Alternative antidepressant, and anxiolytic are recommended.  RECOMMENDATION:  1.  Discontinue  Previous meds/stopped with report of high level potassium.  2.  Begin Wellbutrin XL 150 mg in am. 3.  Begin Vistaril 25 mg 2 x daily.  4.  No further psychiatric needs unless requested.   MD Psychiatrist signs  off.  Mickeal Skinner MD 02/03/2012 10:48 AM

## 2012-02-03 NOTE — Consult Note (Signed)
Patient [name]      DOB: [birth year]      GEX:[id number]     Consult Note from the Palliative Medicine Team at [facility]    Consult Requested by: Stormy Card NP     PCP: Thora Lance, MD Reason for Consultation:Symptom Management/Pain    Phone Number:[phone]  Assessment of patients Current state: Patient alert, verbally responsive sitting up in bed, oxygen on via nasal cannuala, respirations unlabored at rest.  Consulted for symptom management for pain, psychiatry is involved with depression and anxiety management. Patient states he has been on various narcotics for pain prescribed by neurologist, also has PCP. Has had neck surgery inn past that required pain management as well as lower extremity pain. Per patient he has been on Oxycontin, oxycodone and Percocet in recent past, and has had exposure to morphine post-operatively for neck surgery. Focus of this visit is for treatment of new onset abdominal pain. Patient is due to get workup on pancreatic mass found this ad  Patient states abdominal pain started 3-4 weeks ago, describes pain as aching/sharp, occassionally radiating to mid back on L side. Pain eases after eating, is present daily, present most of day with  intermittent increases in severity. Rates pain 8-10/10, decreases to 6/10 after taking oral pain medication which is tolerable. Denies presence of nausea/vomiting with pain, admits to having poor appetite over past 6 months and 30 lb weight loss.  Has experienced constipation in past requiring laxatives for relief.  Patient also has concerns about cost of co-pay with medication, states he could not afford Oxycontin in the past so he just didn't get prescription refilled.   Most recently patient has been getting Oxycodone prescriptions from Dr Lonia Skinner (Neurologist) for chronic neck pain.   Symptom Management:   Pain/Mid-Epigastric: Currently Total Oxycodone Dose: 70 mg/24 hours, Morphine equivalent dose  105 mg. Decrease of 33% for cross tolerance. Plan for pain management:   Morphine 30 mg SR oral every 12 hours  Morphine IR 7.5 mg every 4 hours as needed  Patient Education:   Needs to continue daily Senakot-S one or twice per day  Dr Jerral Ralph not here tomorrow to speak with Dr Benjamine Mola who will be discharging physician for plan for refills of pain medication.  Patient educated on importance of not running out of narcotics and also symptoms of withdrawal syndrome.   4. Disposition: possible discharge to home tomorrow, with home health services   4. Psychosocial:Emotional support to patient regarding new found abdominal masss  5. Spiritual: declined intervention   Brief HPI: Patient is a 77 yo WM with past medical history of PVD, RA, COPD, previous neck surgery. Admitted 01/31/12 due to increasing SOB over past 2 weeks admitted for COPD exacerbation. 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    ROS:  + pain, + anxiety, + decreased appetite, denies n/v   PMH:  Past Medical History  Diagnosis Date  . Hyperlipidemia   . History of colon polyps   . Spinal stenosis   . Anxiety   . BPH (benign prostatic hypertrophy)   . ED (erectile dysfunction)   . Pernicious anemia   . Osteoporosis   . Hypertension   . Chest pain     "I've had it" (01/31/2012)   . Peripheral vascular disease     "right leg is 100% blocked; left leg has stent, still ~  75% blocked" (01/31/2012)  . Peripheral neuropathy     "bad" (01/31/2012)  . COPD (chronic obstructive pulmonary disease)   . Emphysema   . Pneumonia 1960's    "double" (01/31/2012)  . BRBPR (bright red blood per rectum)     "don't know what it's from; had some this week" (01/31/2012)  . Cervical spondylosis   . Rheumatoid arthritis   . Arthritis     "hands and feet" (01/31/2012)  . DDD (degenerative disc disease), lumbosacral     "S1; L5"  (01/31/2012)  . Chronic lower back pain   . Depression     "imagine I've got a mild depression w/what I've gone thru last month" (01/31/2012)  . Prostate cancer 2004    "had 40 treatments of radiation" (01/31/2012)  . Breast cancer in male     "left" (01/31/2012)  . Basal cell carcinoma of nasal tip     "     PSH: Past Surgical History  Procedure Date  . Basal cell carcinoma excision     Nose x 3  . Cholecystectomy ?1990's  . Wrist fusion     Right Wrist; "3 OR's; it's fused" (01/31/2012)  . Excisional hemorrhoidectomy 1960's  . Knee surgery     "right; X 6 surgeries; went in for simple cartilage OR; ended up w/fused knee" (01/31/2012)  . Iliac artery stent 12-07-09    Stent done by Dr. Allyson Sabal  . Total hip arthroplasty 08/16/2011    Procedure: TOTAL HIP ARTHROPLASTY ANTERIOR APPROACH;  Surgeon: Kathryne Hitch, MD;  Location: [facility] OR;  Service: Orthopedics;  Laterality: Right;  Right total hip replacement  . Cervical disc surgery     "7 total; ended w/a fusion" (01/31/2012)  . Breast lumpectomy 1990's    "left" (01/31/2012)   I have reviewed the FH and SH and  If appropriate update it with new information. Allergies  Allergen Reactions  . Lipitor (Atorvastatin Calcium) Other (See Comments)    Myalgias "don't remember how bad" (01/31/2012)  . Tiotropium Bromide Monohydrate Other (See Comments)    hoarseness   Scheduled Meds:   . ipratropium  0.5 mg Nebulization Q6H   And  . albuterol  2.5 mg Nebulization Q6H  . doxycycline  100 mg Oral Q12H  . escitalopram  10 mg Oral Daily  . feeding supplement  237 mL Oral BID BM  . heparin  5,000 Units Subcutaneous Q12H  . hydrOXYzine  25 mg Oral BID  . morphine  30 mg Oral Q12H  . multivitamin with minerals  1 tablet Oral Daily  . nicotine  14 mg Transdermal Daily  . pantoprazole  40 mg Oral Daily  . polyethylene glycol  17 g Oral BID  . predniSONE  40 mg Oral Q breakfast  . senna  1 tablet Oral QHS  . traZODone  100 mg  Oral QHS   Continuous Infusions:  PRN Meds:.ALPRAZolam, morphine, ondansetron    BP 119/63  Pulse 91  Temp 97.7 F (36.5 C) (Oral)  Resp 18  Ht 5\' 8"  (1.727 m)  Wt 40.5 kg (89 lb 4.6 oz)  BMI 13.58 kg/m2  SpO2 98%   PPS: 70-80%   Intake/Output Summary (Last 24 hours) at 02/03/12 1604 Last data filed at 02/03/12 0551  Gross per 24 hour  Intake 696.25 ml  Output      0 ml  Net 696.25 ml   EAV:WUJWJ  Stool Softner: ordered  Physical Exam:  General: alert, pleasant, thin male HEENT:  Anicteric, buccal mucosa moist Chest: fine rhonchi, respirations unlabored, no accessory muscle use at rest CVS: RRR Abdomen:BS audible Ext: muscle wasting lower extremities, brawny appearance of skin Neuro: oriented x 3  Labs: CBC    Component Value Date/Time   WBC 8.0 02/03/2012 0557   RBC 4.22 02/03/2012 0557   HGB 13.0 02/03/2012 0557   HCT 39.1 02/03/2012 0557   PLT 121* 02/03/2012 0557   MCV 92.7 02/03/2012 0557   MCH 30.8 02/03/2012 0557   MCHC 33.2 02/03/2012 0557   RDW 15.4 02/03/2012 0557   LYMPHSABS 0.7 01/31/2012 2006   MONOABS 0.3 01/31/2012 2006   EOSABS 0.0 01/31/2012 2006   BASOSABS 0.0 01/31/2012 2006    BMET    Component Value Date/Time   NA 140 02/03/2012 0557   K 3.4* 02/03/2012 0557   CL 98 02/03/2012 0557   CO2 36* 02/03/2012 0557   GLUCOSE 89 02/03/2012 0557   BUN 15 02/03/2012 0557   CREATININE 0.97 02/03/2012 0557   CALCIUM 9.4 02/03/2012 0557   GFRNONAA 79* 02/03/2012 0557   GFRAA >90 02/03/2012 0557    CMP     Component Value Date/Time   NA 140 02/03/2012 0557   K 3.4* 02/03/2012 0557   CL 98 02/03/2012 0557   CO2 36* 02/03/2012 0557   GLUCOSE 89 02/03/2012 0557   BUN 15 02/03/2012 0557   CREATININE 0.97 02/03/2012 0557   CALCIUM 9.4 02/03/2012 0557   PROT 5.7* 02/03/2012 0557   ALBUMIN 3.2* 02/03/2012 0557   AST 15 02/03/2012 0557   ALT 14 02/03/2012 0557   ALKPHOS 70 02/03/2012 0557   BILITOT 0.4  02/03/2012 0557   GFRNONAA 79* 02/03/2012 0557   GFRAA >90 02/03/2012 0557    CT ABD/Pelvis of the Head Reviewed/Impressions:01/26/12  IMPRESSION:  1. 4 mm right lower lobe pulmonary nodule. If the patient is at  high risk for bronchogenic carcinoma, follow-up chest CT at 1 year  is recommended. If the patient is at low risk, no follow-up is  needed. This recommendation follows the consensus statement:  Guidelines for Management of Small Pulmonary Nodules Detected on CT  Scans: A Statement from the Fleischner Society as published in  Radiology 2005; 237:395-400. Emphysema.  2. Cholecystectomy with progressive intra and extrahepatic biliary  ductal dilation. Small peri ampullary soft tissue density lesion  which may represent a small partially calcified stones or small  obstructing neoplasm. Correlation with bilirubin recommended.  Consider follow-up MRCP or ERCP. Non-emergent MRI should be  deferred until patient has been discharged for the acute illness,  and can optimally cooperate with positioning and breath-holding  instructions.  3. Nonobstructive 4 mm left interpolar renal collecting system  calculus. Probable small bilateral renal cysts.  4. Severe atherosclerosis. Thrombosis of the right common iliac  artery.  5. Stable benign adrenal nodules consistent with adenoma.   Time In Time Out Total Time Spent with Patient Total Overall Time  3:00p 4:00p 60 min 60 min    Greater than 50%  of this time was spent counseling and coordinating care related to the above assessment and plan.   Freddie Breech, CNS-C Palliative Medicine Team [facility] Team Phone: [phone] Pager: [phone]

## 2012-02-03 NOTE — Progress Notes (Signed)
Physical Therapy Treatment Patient Details Name: [name] MRN: [id number] DOB: Mar 24, 1934 Today's Date: 02/03/2012 Time: 1140-1200 PT Time Calculation (min): 20 min  PT Assessment / Plan / Recommendation Comments on Treatment Session  Pt adm with pancreatic mass.  Pt with good mobility but very anxious about being home alone.  Recommended to pt that he look into to option of [facility] at home.  Has expressed that he had home health in the past but was not pleased with it.    Follow Up Recommendations  Home health PT (If pt changes his mind and is agreeable for safety eval)     Does the patient have the potential to tolerate intense rehabilitation     Barriers to Discharge        Equipment Recommendations  None recommended by PT    Recommendations for Other Services    Frequency Min 3X/week   Plan      Precautions / Restrictions     Pertinent Vitals/Pain Pt c/o neuropathic pain in feet.    Mobility  Bed Mobility Supine to Sit: 7: Independent Sitting - Scoot to Edge of Bed: 7: Independent Sit to Supine: 7: Independent Transfers Sit to Stand: 6: Modified independent (Device/Increase time);With upper extremity assist;From bed;From toilet Stand to Sit: 6: Modified independent (Device/Increase time);With upper extremity assist;To bed;To toilet Ambulation/Gait Ambulation/Gait Assistance: 5: Supervision Ambulation Distance (Feet): 200 Feet Assistive device: Rolling walker Ambulation/Gait Assistance Details: Slight decr clearance of rt foot during swing today. Gait Pattern: Step-through pattern;Decreased stride length Gait velocity: decr    Exercises     PT Diagnosis:    PT Problem List:   PT Treatment Interventions:     PT Goals Acute Rehab PT Goals PT Goal: Sit to Stand - Progress: Met PT Goal: Stand to Sit - Progress: Met PT Goal: Ambulate - Progress: Progressing toward goal  Visit Information  Last PT Received On: 02/03/12 Assistance Needed: +1     Subjective Data  Subjective: "I had a bad night."   Cognition  Overall Cognitive Status: Appears within functional limits for tasks assessed/performed Arousal/Alertness: Awake/alert Orientation Level: Appears intact for tasks assessed Cognition - Other Comments: Pt repeats that he is afraid to be home alone.    Balance  Static Standing Balance Static Standing - Balance Support: Bilateral upper extremity supported Static Standing - Level of Assistance: 6: Modified independent (Device/Increase time)  End of Session PT - End of Session Activity Tolerance: Patient tolerated treatment well Patient left: in bed;with call bell/phone within reach;with bed alarm set Nurse Communication: Mobility status   GP     Izela Altier 02/03/2012, 1:40 PM  [facility] PT [phone]

## 2012-02-03 NOTE — Progress Notes (Signed)
[facility]  Patient Status: New  [facility] is providing the following services: RN, PT and MSW  If patient discharges after hours, please call [phone].   [name] 02/03/2012, 5:11 PM

## 2012-02-03 NOTE — Progress Notes (Signed)
[facility] PROGRESS NOTE  [name] WNU:[id number] DOB: [birth year] DOA: 01/31/2012 PCP: Thora Lance, MD  Assessment/Plan:   Acute Hyperkalemia (12/19)  Etiology uncertain-?? From meds? K++ climbed from 4.7 on 12/17 to 6.3 on 12/19 without K++ supplementation. Given kayexalate x 2 doses, d50 and insulin.  Briefly placed on a Bicarb drip. K 3.4 (12/20).  Will monitor overnight and recheck in am. Wellbutrin, buspar discontinued, and replaced with Lexapro and Hydroxyzine.  Abdominal pain / Weight Loss / Pancreatic Mass Ampullary mass being evaluated by [facility].   Will schedule outpatient EUS with Biopsy. Pulmonary Clearance given for endoscopic procedure-EUS Weight loss 150 -> 110 over the course of several months.  Unable to eat.  Nutrition consult completed. Now on long acting oxycontin 15 mg BID, and use 10 mg of Oxycodone for breakthrough pain.Titrate as necessary  Acute on COPD Appreciate pulmonary recommendations. Nebs/Steroids/doxycycline  Lungs with improved rhonchi.  No wheeze Chest CT negative for lung cancer. Has been on prednisone 40 mg po for several weeks prior to admission.  Anxiety / Depression Psychiatry consulted. Patient with anxiety.  Recent loss of his wife.  Feelings of impending death. Started on wellbutrin, buspar, and trazodone.  Due to hyperkalemia.  Wellbutrin and buspar discontinued. Hydroxyzine and lexapro started 12/20 after d/w Psych  Chronic Pain Severe PVD with chronic occlusion of right lower extremity (PT eval completed) Cervical spondylosis DDD L5 - S1 Now on long acting oxycontin 15 mg BID, and use 10 mg of Oxycodone for breakthrough pain. Patient able ambulate short distances slowly with a walker Called a Palliative consult for pain and symptom management.  GERD Continue PPI  HIstory of Prostate Ca/Breast Ca Quiet.  No issues currently  Tobacco abuse 1/2 PPD.  Nicotine patch placed.  DVT  prophylaxis Prophylactic  heparin   Code Status: full Family Communication:  Disposition Plan: inpatient to monitor hyperkalemia.   Consultants:  PCCM:  Dr. Marchelle Gearing  Psych:  Dr. Ferol Luz  Palliative Medicine for Pain and symptom management.  Procedures:    Antibiotics:  Doxycycline  HPI/Subjective: Reports vomiting multiple times overnight.  Now with severe back pain.  Objective: Filed Vitals:   02/02/12 0713 02/02/12 1455 02/02/12 2257 02/03/12 0500  BP: 142/72 122/89 147/82 145/75  Pulse:  61 75 69  Temp:  98.5 F (36.9 C) 97.9 F (36.6 C) 98.3 F (36.8 C)  TempSrc:  Oral Oral Oral  Resp:  16 16 16   Height:      Weight:    40.5 kg (89 lb 4.6 oz)  SpO2:  98% 97% 95%    Intake/Output Summary (Last 24 hours) at 02/03/12 1225 Last data filed at 02/03/12 0551  Gross per 24 hour  Intake 696.25 ml  Output      0 ml  Net 696.25 ml   Filed Weights   02/01/12 0603 02/02/12 0500 02/03/12 0500  Weight: 40.1 kg (88 lb 6.5 oz) 40.6 kg (89 lb 8.1 oz) 40.5 kg (89 lb 4.6 oz)    Exam:   General: Sleeping, awakens easily.  Appears in mild distress  Cardiovascular: RRR, no M/R/G  Respiratory:  No accessory muscle use. BS improving.  Still with some rhonchi.  Abdomen: Thin, + BS, non distended, no masses  Musculo-skeletal:  Able to move all 4 extremities  Psych:  "I don't know if I'm going to make it".  Patient describes feelings of impending death when home alone.  Data Reviewed: Basic Metabolic Panel:  Lab 02/03/12 0347 02/02/12 1649 02/02/12  1357 02/02/12 0758 02/01/12 0922 01/31/12 2006  NA 140 137 136 136 133* --  K 3.4* 5.7* 5.6* 6.3* 5.5* --  CL 98 96 95* 97 93* --  CO2 36* 29 31 32 30 --  GLUCOSE 89 209* 129* 108* 170* --  BUN 15 22 22  24* 28* --  CREATININE 0.97 1.04 1.06 1.09 1.20 --  CALCIUM 9.4 10.0 10.3 10.1 10.0 --  MG -- -- -- -- 2.4 2.4  PHOS -- -- -- -- 3.4 4.5   Liver Function Tests:  Lab 02/03/12 0557 01/31/12 2006  AST 15 23   ALT 14 16  ALKPHOS 70 94  BILITOT 0.4 0.6  PROT 5.7* 7.5  ALBUMIN 3.2* 4.2    Lab 01/31/12 2006  LIPASE 17  AMYLASE 66   CBC:  Lab 02/03/12 0557 02/01/12 0922 01/31/12 2006  WBC 8.0 7.7 7.4  NEUTROABS -- -- 6.5  HGB 13.0 13.9 14.1  HCT 39.1 40.8 41.0  MCV 92.7 90.9 90.1  PLT 121* 134* 148*   Cardiac Enzymes:  Lab 02/02/12 1357 02/01/12 0922 02/01/12 0159 01/31/12 2007  CKTOTAL 26 -- -- --  CKMB -- -- -- --  CKMBINDEX -- -- -- --  TROPONINI -- <0.30 <0.30 <0.30   BNP (last 3 results)  Basename 01/31/12 2007  PROBNP 170.0   Studies: Ct Chest W Contrast  02/02/2012  *RADIOLOGY REPORT*  Clinical Data: Cough.  Chest pain.  Shortness of breath.  Personal history of prostate and skin cancer.  CT CHEST WITH CONTRAST  Technique:  Multidetector CT imaging of the chest was performed following the standard protocol during bolus administration of intravenous contrast.  Contrast: 75mL OMNIPAQUE IOHEXOL 300 MG/ML  SOLN  Comparison: None.  Findings: Severe pulmonary emphysema is noted.  No suspicious pulmonary nodules or masses are identified.  There is no evidence of pulmonary infiltrate or central endobronchial obstruction.  No evidence of pleural or pericardial effusion.  No evidence of mediastinal or hilar masses.  No adenopathy seen elsewhere within the thorax.  No suspicious bone lesions identified.  The adrenal glands are normal in appearance.  IMPRESSION:  Severe pulmonary emphysema.  No active disease.   Original Report Authenticated By: Myles Rosenthal, M.D.     Scheduled Meds:    . ipratropium  0.5 mg Nebulization Q6H   And  . albuterol  2.5 mg Nebulization Q6H  . doxycycline  100 mg Oral Q12H  . escitalopram  10 mg Oral Daily  . feeding supplement  237 mL Oral BID BM  . heparin  5,000 Units Subcutaneous Q12H  . hydrOXYzine  25 mg Oral BID  . multivitamin with minerals  1 tablet Oral Daily  . nicotine  14 mg Transdermal Daily  . OxyCODONE  15 mg Oral Q12H  . pantoprazole   40 mg Oral Daily  . polyethylene glycol  17 g Oral BID  . predniSONE  30 mg Oral Q breakfast  . senna  1 tablet Oral QHS  . traZODone  100 mg Oral QHS   Continuous Infusions:   Principal Problem:  *Pancreatic mass Active Problems:  Chronic total occlusion of artery of the extremities  Depression  Failure to thrive  Cachexia  COPD exacerbation  Weight loss    Time spent: 45 min.    Conley Canal  [facility] Pager [phone]. If 8PM-8AM, please contact night-coverage at [email or web address], password [facility] 02/03/2012, 12:25 PM  LOS: 3 days     Attending - Hives seen and examined  the patient, I agree with the above assessment and plan. Patient had persistent hyperkalemia yesterday. Patient required insulin along with D50, and numerous doses of Kayexalate and a bicarbonate infusion overnight. Potassium has subsequently returned to normal. Patient did have a few episodes of vomiting last night, but none today. His abdomen is soft. He still has some wheezing on his lung exam. Patient is very anxious and seems overwhelmed by his cancer diagnoses. We appreciate psychiatry assistance in managing anxiety and depression issues, pain is still a big component here as well. Patient would benefit from a palliative care consultation, for symptom management. He will continue on steroids, nebulized bronchodilators and doxycycline. He would require better anxiety and pain control prior to discharge. After discussion with [facility] gastroenterology, EUS and pancreatic biopsy would be done as an outpatient after his pulmonary issues have stabilized.  S Syd Newsome

## 2012-02-04 LAB — BASIC METABOLIC PANEL
CO2: 36 mEq/L — ABNORMAL HIGH (ref 19–32)
GFR calc non Af Amer: 58 mL/min — ABNORMAL LOW (ref >90)
Glucose, Bld: 79 mg/dL (ref 70–99)
Potassium: 4.6 mEq/L (ref 3.5–5.1)
Sodium: 138 mEq/L (ref 135–145)

## 2012-02-04 LAB — CBC
Hemoglobin: 12.3 g/dL — ABNORMAL LOW (ref 13.0–17.0)
MCH: 31.1 pg (ref 26.0–34.0)
MCV: 92.4 fL (ref 78.0–100.0)
RBC: 3.95 MIL/uL — ABNORMAL LOW (ref 4.22–5.81)

## 2012-02-04 NOTE — Progress Notes (Signed)
[facility] PROGRESS NOTE  CAP MASSI ZOX:[id number] DOB: [birth year] DOA: 01/31/2012 PCP: Thora Lance, MD  Assessment/Plan: Acute Hyperkalemia (12/19)  Etiology uncertain-?? From meds?  K++ climbed from 4.7 on 12/17 to 6.3 on 12/19 without K++ supplementation.  Given kayexalate x 2 doses, d50 and insulin. Briefly placed on a Bicarb drip.  K 3.4 (12/20).   Wellbutrin, buspar discontinued, and replaced with Lexapro and Hydroxyzine.   Abdominal pain / Weight Loss / Pancreatic Mass  Ampullary mass being evaluated by [facility]. Will schedule outpatient EUS with Biopsy.  Pulmonary Clearance given for endoscopic procedure-EUS  Weight loss 150 -> 110 over the course of several months. Unable to eat. Nutrition consult completed.  Now on long acting oxycontin 15 mg BID, and use 10 mg of Oxycodone for breakthrough pain.Titrate as necessary   Acute on COPD  Appreciate pulmonary recommendations.  Nebs/Steroids/doxycycline  Lungs with improved rhonchi. No wheeze  Chest CT negative for lung cancer.  Has been on prednisone 40 mg po for several weeks prior to admission.   Anxiety / Depression  Psychiatry consulted.  Patient with anxiety. Recent loss of his wife. Feelings of impending death.  Started on wellbutrin, buspar, and trazodone. Due to hyperkalemia. Wellbutrin and buspar discontinued.  Hydroxyzine and lexapro started 12/20 after d/w Psych   Chronic Pain  Severe PVD with chronic occlusion of right lower extremity (PT eval completed)  Cervical spondylosis  DDD L5 - S1  Now on long acting oxycontin 15 mg BID, and use 10 mg of Oxycodone for breakthrough pain.  Patient able ambulate short distances slowly with a walker  Called a Palliative consult for pain and symptom management.   GERD  Continue PPI   HIstory of Prostate Ca/Breast Ca  Quiet. No issues currently   Tobacco abuse  1/2 PPD. Nicotine patch placed.   Code Status: full Family Communication:  patient at bedside Disposition Plan: home sunday   Consultants:  Psych  palliative care  Procedures:    Antibiotics:    HPI/Subjective: Feeling tired C/o abd pain   Objective: Filed Vitals:   02/03/12 2222 02/04/12 0127 02/04/12 0523 02/04/12 0747  BP:  107/61 134/66   Pulse: 70 68 66   Temp:  97.7 F (36.5 C) 98.2 F (36.8 C)   TempSrc:  Oral Oral   Resp: 18 18 20    Height:      Weight:   41.7 kg (91 lb 14.9 oz)   SpO2: 95% 95% 95% 96%   No intake or output data in the 24 hours ending 02/04/12 1149 Filed Weights   02/02/12 0500 02/03/12 0500 02/04/12 0523  Weight: 40.6 kg (89 lb 8.1 oz) 40.5 kg (89 lb 4.6 oz) 41.7 kg (91 lb 14.9 oz)    Exam:   General:  Anxious, frail male  Cardiovascular: rrr  Respiratory: clear anterior  Abdomen: +BS, soft, NT  Data Reviewed: Basic Metabolic Panel:  Lab 02/04/12 8119 02/03/12 0557 02/02/12 1649 02/02/12 1357 02/02/12 0758 02/01/12 0922 01/31/12 2006  NA 138 140 137 136 136 -- --  K 4.6 3.4* 5.7* 5.6* 6.3* -- --  CL 95* 98 96 95* 97 -- --  CO2 36* 36* 29 31 32 -- --  GLUCOSE 79 89 209* 129* 108* -- --  BUN 20 15 22 22  24* -- --  CREATININE 1.19 0.97 1.04 1.06 1.09 -- --  CALCIUM 9.4 9.4 10.0 10.3 10.1 -- --  MG -- -- -- -- -- 2.4 2.4  PHOS -- -- -- -- --  3.4 4.5   Liver Function Tests:  Lab 02/03/12 0557 01/31/12 2006  AST 15 23  ALT 14 16  ALKPHOS 70 94  BILITOT 0.4 0.6  PROT 5.7* 7.5  ALBUMIN 3.2* 4.2    Lab 01/31/12 2006  LIPASE 17  AMYLASE 66   No results found for this basename: AMMONIA:5 in the last 168 hours CBC:  Lab 02/04/12 0610 02/03/12 0557 02/01/12 0922 01/31/12 2006  WBC 6.9 8.0 7.7 7.4  NEUTROABS -- -- -- 6.5  HGB 12.3* 13.0 13.9 14.1  HCT 36.5* 39.1 40.8 41.0  MCV 92.4 92.7 90.9 90.1  PLT 105* 121* 134* 148*   Cardiac Enzymes:  Lab 02/02/12 1357 02/01/12 0922 02/01/12 0159 01/31/12 2007  CKTOTAL 26 -- -- --  CKMB -- -- -- --  CKMBINDEX -- -- -- --  TROPONINI --  <0.30 <0.30 <0.30   BNP (last 3 results)  Basename 01/31/12 2007  PROBNP 170.0   CBG: No results found for this basename: GLUCAP:5 in the last 168 hours  Recent Results (from the past 240 hour(s))  CULTURE, BLOOD (ROUTINE X 2)     Status: Normal   Collection Time   01/31/12  7:30 PM      Component Value Range Status Comment   Specimen Description BLOOD RIGHT ARM   Final    Special Requests BOTTLES DRAWN AEROBIC AND ANAEROBIC 10CC EACH   Final    Culture  Setup Time 02/01/2012 01:56   Final    Culture     Final    Value: STAPHYLOCOCCUS SPECIES (COAGULASE NEGATIVE)     Note: THE SIGNIFICANCE OF ISOLATING THIS ORGANISM FROM A SINGLE SET OF BLOOD CULTURES WHEN MULTIPLE SETS ARE DRAWN IS UNCERTAIN. PLEASE NOTIFY THE MICROBIOLOGY DEPARTMENT WITHIN ONE WEEK IF SPECIATION AND SENSITIVITIES ARE REQUIRED.     Note: Gram Stain Report Called to,Read Back By and Verified With: SONIA COLUMBRE ON 02/01/2012 AT 9:39P BY WILEJ   Report Status 02/03/2012 FINAL   Final   CULTURE, BLOOD (ROUTINE X 2)     Status: Normal (Preliminary result)   Collection Time   01/31/12  7:53 PM      Component Value Range Status Comment   Specimen Description BLOOD RIGHT ARM   Final    Special Requests BOTTLES DRAWN AEROBIC AND ANAEROBIC 10CC EACH   Final    Culture  Setup Time 02/01/2012 01:56   Final    Culture     Final    Value:        BLOOD CULTURE RECEIVED NO GROWTH TO DATE CULTURE WILL BE HELD FOR 5 DAYS BEFORE ISSUING A FINAL NEGATIVE REPORT   Report Status PENDING   Incomplete   URINE CULTURE     Status: Normal   Collection Time   01/31/12 11:48 PM      Component Value Range Status Comment   Specimen Description URINE, CLEAN CATCH   Final    Special Requests NONE   Final    Culture  Setup Time 02/01/2012 00:13   Final    Colony Count NO GROWTH   Final    Culture NO GROWTH   Final    Report Status 02/02/2012 FINAL   Final      Studies: No results found.  Scheduled Meds:   . ipratropium  0.5 mg  Nebulization Q6H   And  . albuterol  2.5 mg Nebulization Q6H  . doxycycline  100 mg Oral Q12H  . escitalopram  10 mg Oral Daily  .  feeding supplement  237 mL Oral BID BM  . heparin  5,000 Units Subcutaneous Q12H  . hydrOXYzine  25 mg Oral BID  . morphine  30 mg Oral Q12H  . multivitamin with minerals  1 tablet Oral Daily  . nicotine  14 mg Transdermal Daily  . pantoprazole  40 mg Oral Daily  . polyethylene glycol  17 g Oral BID  . predniSONE  40 mg Oral Q breakfast  . senna  1 tablet Oral QHS  . traZODone  100 mg Oral QHS   Continuous Infusions:   Principal Problem:  *Pancreatic mass Active Problems:  Chronic total occlusion of artery of the extremities  Depression  Failure to thrive  Cachexia  COPD exacerbation  Weight loss  Pain localized to upper abdomen    Time spent: 35    [facility], JESSICA  [facility] Pager [phone]. If 8PM-8AM, please contact night-coverage at [email or web address], password [facility] 02/04/2012, 11:49 AM  LOS: 4 days

## 2012-02-05 MED ORDER — FLUCONAZOLE 100 MG PO TABS
100.0000 mg | ORAL_TABLET | Freq: Every day | ORAL | Status: DC
  Administered 2012-02-06: 100 mg via ORAL
  Filled 2012-02-05: qty 1

## 2012-02-05 MED ORDER — FLUCONAZOLE 200 MG PO TABS
200.0000 mg | ORAL_TABLET | Freq: Once | ORAL | Status: AC
  Administered 2012-02-05: 200 mg via ORAL
  Filled 2012-02-05: qty 1

## 2012-02-05 MED ORDER — ALBUTEROL SULFATE (5 MG/ML) 0.5% IN NEBU
2.5000 mg | INHALATION_SOLUTION | Freq: Three times a day (TID) | RESPIRATORY_TRACT | Status: DC
  Administered 2012-02-05 – 2012-02-06 (×2): 2.5 mg via RESPIRATORY_TRACT
  Filled 2012-02-05 (×3): qty 0.5

## 2012-02-05 MED ORDER — IPRATROPIUM BROMIDE 0.02 % IN SOLN
0.5000 mg | Freq: Three times a day (TID) | RESPIRATORY_TRACT | Status: DC
  Administered 2012-02-05 – 2012-02-06 (×2): 0.5 mg via RESPIRATORY_TRACT
  Filled 2012-02-05 (×3): qty 2.5

## 2012-02-05 MED ORDER — GUAIFENESIN ER 600 MG PO TB12
1200.0000 mg | ORAL_TABLET | Freq: Two times a day (BID) | ORAL | Status: DC
  Administered 2012-02-05 – 2012-02-06 (×2): 1200 mg via ORAL
  Filled 2012-02-05 (×3): qty 2

## 2012-02-05 MED ORDER — NYSTATIN 100000 UNIT/ML MT SUSP
5.0000 mL | Freq: Four times a day (QID) | OROMUCOSAL | Status: DC
  Administered 2012-02-05 – 2012-02-06 (×6): 500000 [IU] via ORAL
  Filled 2012-02-05 (×8): qty 5

## 2012-02-05 MED ORDER — ALBUTEROL SULFATE (5 MG/ML) 0.5% IN NEBU: 2.5000 mg | INHALATION_SOLUTION | RESPIRATORY_TRACT | Status: DC | PRN

## 2012-02-05 MED ORDER — MORPHINE SULFATE ER 30 MG PO TBCR
45.0000 mg | EXTENDED_RELEASE_TABLET | Freq: Two times a day (BID) | ORAL | Status: DC
  Administered 2012-02-06 (×2): 45 mg via ORAL
  Filled 2012-02-05 (×2): qty 1

## 2012-02-05 NOTE — Progress Notes (Signed)
Patient complained that throat feels swollen.  Upon assessment his throat is red, slightly swollen with white spots on his tongue and tonsils.  Meds given.  Will continue to monitor patient.

## 2012-02-05 NOTE — Progress Notes (Signed)
[facility] PROGRESS NOTE  [name] GEX:[id number] DOB: [birth year] DOA: 01/31/2012 PCP: Thora Lance, MD  Assessment/Plan: Acute Hyperkalemia (12/19)  Etiology uncertain-?? From meds?  K++ climbed from 4.7 on 12/17 to 6.3 on 12/19 without K++ supplementation.  Given kayexalate x 2 doses, d50 and insulin. Briefly placed on a Bicarb drip.  K 3.4 (12/20).   Wellbutrin, buspar discontinued, and replaced with Lexapro and Hydroxyzine.   Abdominal pain / Weight Loss / Pancreatic Mass  Ampullary mass being evaluated by [facility]. Will schedule outpatient EUS with Biopsy.  Pulmonary Clearance given for endoscopic procedure-EUS  Weight loss 150 -> 110 over the course of several months. Unable to eat. Nutrition consult completed.  Now on long acting oxycontin 15 mg BID, and use 10 mg of Oxycodone for breakthrough pain.Titrate as necessary   Acute on COPD  Appreciate pulmonary recommendations.  Nebs/Steroids/doxycycline  Lungs with improved rhonchi. Mild wheezing  Has been on prednisone 40 mg po for several weeks prior to admission.   Anxiety / Depression  Psychiatry consulted.  Patient with anxiety. Recent loss of his wife. Feelings of impending death.  Started on wellbutrin, buspar, and trazodone. Due to hyperkalemia. Wellbutrin and buspar discontinued.  Hydroxyzine and lexapro started 12/20 after d/w Psych   Chronic Pain  Severe PVD with chronic occlusion of right lower extremity (PT eval completed)  Cervical spondylosis  DDD L5 - S1  Now on long acting oxycontin 15 mg BID, and use 10 mg of Oxycodone for breakthrough pain.  Patient able ambulate short distances slowly with a walker  Called a Palliative consult for pain and symptom management.   GERD  Continue PPI   HIstory of Prostate Ca/Breast Ca  Quiet. No issues currently   Tobacco abuse  1/2 PPD. Nicotine patch placed.  Thrush -diflucan  -nystantin  Code Status: full Family Communication:  patient at bedside Disposition Plan: home Monday   Consultants:  Psych  palliative care  Procedures:    Antibiotics:    HPI/Subjective: Feeling tired/weak C/o pain in throat- diff swallowing   Objective: Filed Vitals:   02/04/12 1420 02/04/12 2047 02/04/12 2135 02/05/12 0453  BP:   114/67 127/63  Pulse:   82 48  Temp:   97.7 F (36.5 C) 97.5 F (36.4 C)  TempSrc:   Oral Oral  Resp:   18 18  Height:      Weight:    40.2 kg (88 lb 10 oz)  SpO2: 97% 98% 97% 94%    Intake/Output Summary (Last 24 hours) at 02/05/12 0832 Last data filed at 02/05/12 0600  Gross per 24 hour  Intake    480 ml  Output      0 ml  Net    480 ml   Filed Weights   02/03/12 0500 02/04/12 0523 02/05/12 0453  Weight: 40.5 kg (89 lb 4.6 oz) 41.7 kg (91 lb 14.9 oz) 40.2 kg (88 lb 10 oz)    Exam:   General:  Anxious, frail male  Cardiovascular: rrr  Respiratory: +wheezing  Abdomen: +BS, soft, NT  Data Reviewed: Basic Metabolic Panel:  Lab 02/04/12 0102 02/03/12 0557 02/02/12 1649 02/02/12 1357 02/02/12 0758 02/01/12 0922 01/31/12 2006  NA 138 140 137 136 136 -- --  K 4.6 3.4* 5.7* 5.6* 6.3* -- --  CL 95* 98 96 95* 97 -- --  CO2 36* 36* 29 31 32 -- --  GLUCOSE 79 89 209* 129* 108* -- --  BUN 20 15 22 22  24* -- --  CREATININE 1.19 0.97 1.04 1.06 1.09 -- --  CALCIUM 9.4 9.4 10.0 10.3 10.1 -- --  MG -- -- -- -- -- 2.4 2.4  PHOS -- -- -- -- -- 3.4 4.5   Liver Function Tests:  Lab 02/03/12 0557 01/31/12 2006  AST 15 23  ALT 14 16  ALKPHOS 70 94  BILITOT 0.4 0.6  PROT 5.7* 7.5  ALBUMIN 3.2* 4.2    Lab 01/31/12 2006  LIPASE 17  AMYLASE 66   No results found for this basename: AMMONIA:5 in the last 168 hours CBC:  Lab 02/04/12 0610 02/03/12 0557 02/01/12 0922 01/31/12 2006  WBC 6.9 8.0 7.7 7.4  NEUTROABS -- -- -- 6.5  HGB 12.3* 13.0 13.9 14.1  HCT 36.5* 39.1 40.8 41.0  MCV 92.4 92.7 90.9 90.1  PLT 105* 121* 134* 148*   Cardiac Enzymes:  Lab 02/02/12 1357  02/01/12 0922 02/01/12 0159 01/31/12 2007  CKTOTAL 26 -- -- --  CKMB -- -- -- --  CKMBINDEX -- -- -- --  TROPONINI -- <0.30 <0.30 <0.30   BNP (last 3 results)  Basename 01/31/12 2007  PROBNP 170.0   CBG: No results found for this basename: GLUCAP:5 in the last 168 hours  Recent Results (from the past 240 hour(s))  CULTURE, BLOOD (ROUTINE X 2)     Status: Normal   Collection Time   01/31/12  7:30 PM      Component Value Range Status Comment   Specimen Description BLOOD RIGHT ARM   Final    Special Requests BOTTLES DRAWN AEROBIC AND ANAEROBIC 10CC EACH   Final    Culture  Setup Time 02/01/2012 01:56   Final    Culture     Final    Value: STAPHYLOCOCCUS SPECIES (COAGULASE NEGATIVE)     Note: THE SIGNIFICANCE OF ISOLATING THIS ORGANISM FROM A SINGLE SET OF BLOOD CULTURES WHEN MULTIPLE SETS ARE DRAWN IS UNCERTAIN. PLEASE NOTIFY THE MICROBIOLOGY DEPARTMENT WITHIN ONE WEEK IF SPECIATION AND SENSITIVITIES ARE REQUIRED.     Note: Gram Stain Report Called to,Read Back By and Verified With: SONIA COLUMBRE ON 02/01/2012 AT 9:39P BY WILEJ   Report Status 02/03/2012 FINAL   Final   CULTURE, BLOOD (ROUTINE X 2)     Status: Normal (Preliminary result)   Collection Time   01/31/12  7:53 PM      Component Value Range Status Comment   Specimen Description BLOOD RIGHT ARM   Final    Special Requests BOTTLES DRAWN AEROBIC AND ANAEROBIC 10CC EACH   Final    Culture  Setup Time 02/01/2012 01:56   Final    Culture     Final    Value:        BLOOD CULTURE RECEIVED NO GROWTH TO DATE CULTURE WILL BE HELD FOR 5 DAYS BEFORE ISSUING A FINAL NEGATIVE REPORT   Report Status PENDING   Incomplete   URINE CULTURE     Status: Normal   Collection Time   01/31/12 11:48 PM      Component Value Range Status Comment   Specimen Description URINE, CLEAN CATCH   Final    Special Requests NONE   Final    Culture  Setup Time 02/01/2012 00:13   Final    Colony Count NO GROWTH   Final    Culture NO GROWTH   Final     Report Status 02/02/2012 FINAL   Final      Studies: No results found.  Scheduled Meds:    .  ipratropium  0.5 mg Nebulization Q6H   And  . albuterol  2.5 mg Nebulization Q6H  . doxycycline  100 mg Oral Q12H  . escitalopram  10 mg Oral Daily  . feeding supplement  237 mL Oral BID BM  . fluconazole  200 mg Oral Once   Followed by  . fluconazole  100 mg Oral Daily  . heparin  5,000 Units Subcutaneous Q12H  . hydrOXYzine  25 mg Oral BID  . morphine  30 mg Oral Q12H  . multivitamin with minerals  1 tablet Oral Daily  . nicotine  14 mg Transdermal Daily  . nystatin  5 mL Oral QID  . pantoprazole  40 mg Oral Daily  . polyethylene glycol  17 g Oral BID  . predniSONE  40 mg Oral Q breakfast  . senna  1 tablet Oral QHS  . traZODone  100 mg Oral QHS   Continuous Infusions:   Principal Problem:  *Pancreatic mass Active Problems:  Chronic total occlusion of artery of the extremities  Depression  Failure to thrive  Cachexia  COPD exacerbation  Weight loss  Pain localized to upper abdomen    Time spent: 25    Marlin Canary  [facility] Pager [phone]. If 8PM-8AM, please contact night-coverage at [email or web address], password [facility] 02/05/2012, 8:32 AM  LOS: 5 days

## 2012-02-06 MED ORDER — PANTOPRAZOLE SODIUM 40 MG PO TBEC: 40.0000 mg | DELAYED_RELEASE_TABLET | Freq: Every day | ORAL | Status: DC

## 2012-02-06 MED ORDER — POLYETHYLENE GLYCOL 3350 17 G PO PACK: 17.0000 g | PACK | Freq: Two times a day (BID) | ORAL | Status: DC

## 2012-02-06 MED ORDER — GUAIFENESIN ER 600 MG PO TB12: 1200.0000 mg | ORAL_TABLET | Freq: Two times a day (BID) | ORAL | Status: DC

## 2012-02-06 MED ORDER — PREDNISONE 20 MG PO TABS: 40.0000 mg | ORAL_TABLET | Freq: Every day | ORAL | Status: DC

## 2012-02-06 MED ORDER — IPRATROPIUM BROMIDE 0.02 % IN SOLN: 0.5000 mg | Freq: Three times a day (TID) | RESPIRATORY_TRACT | Status: DC

## 2012-02-06 MED ORDER — MORPHINE SULFATE 15 MG PO TABS: 7.5000 mg | ORAL_TABLET | ORAL | Status: DC | PRN

## 2012-02-06 MED ORDER — ALPRAZOLAM 1 MG PO TABS: 1.0000 mg | ORAL_TABLET | Freq: Every day | ORAL | Status: DC

## 2012-02-06 MED ORDER — MORPHINE SULFATE ER 15 MG PO TBCR: 45.0000 mg | EXTENDED_RELEASE_TABLET | Freq: Two times a day (BID) | ORAL | Status: DC

## 2012-02-06 MED ORDER — ALBUTEROL SULFATE (5 MG/ML) 0.5% IN NEBU: 2.5000 mg | INHALATION_SOLUTION | Freq: Three times a day (TID) | RESPIRATORY_TRACT | Status: DC

## 2012-02-06 MED ORDER — NICOTINE 14 MG/24HR TD PT24: 1.0000 | MEDICATED_PATCH | Freq: Every day | TRANSDERMAL | Status: DC

## 2012-02-06 MED ORDER — NYSTATIN 100000 UNIT/ML MT SUSP: 5.0000 mL | Freq: Four times a day (QID) | OROMUCOSAL | Status: DC

## 2012-02-06 MED ORDER — HYDROXYZINE HCL 25 MG PO TABS: 25.0000 mg | ORAL_TABLET | Freq: Two times a day (BID) | ORAL | Status: DC

## 2012-02-06 MED ORDER — FLUCONAZOLE 100 MG PO TABS: 100.0000 mg | ORAL_TABLET | Freq: Every day | ORAL | Status: DC

## 2012-02-06 MED ORDER — DOXYCYCLINE HYCLATE 100 MG PO TABS: 100.0000 mg | ORAL_TABLET | Freq: Two times a day (BID) | ORAL | Status: DC

## 2012-02-06 MED ORDER — ESCITALOPRAM OXALATE 10 MG PO TABS: 10.0000 mg | ORAL_TABLET | Freq: Every day | ORAL | Status: DC

## 2012-02-06 NOTE — Progress Notes (Signed)
Clinical Social Work  CSW spoke with PT who is recommending SNF. CSW met with patient at bedside to discuss SNF option. Patient reports he is undecided regarding SNF vs HH. Patient reports he does not want to be at SNF over the holidays but is worried about being home alone. Patient is aware of copays which is also affecting his decision. Patient agreeable for CSW to fax out information while he thinks about decision.   CSW provided patient with SNF list and explained process. CSW completed FL2 and faxed out. CSW will follow up with bed offers.  [place], [place] [phone]

## 2012-02-06 NOTE — Progress Notes (Signed)
Pt seen asleep in bed, no concern at this time.

## 2012-02-06 NOTE — Progress Notes (Signed)
Pt prepared for d/c to SNF. IV d/c'd. Skin intact except as most recently charted. Vitals are stable. Report called to receiving facility. Pt to be transported by ambulance service. 

## 2012-02-06 NOTE — Discharge Summary (Signed)
Physician Discharge Summary  [name] ZOX:[id number] DOB: 1934/10/03 DOA: 01/31/2012  PCP: Thora Lance, MD  Admit date: 01/31/2012 Discharge date: 02/06/2012  Time spent: 35 minutes  Recommendations for Outpatient Follow-up:  1. Doxy for 3 more days 2. Diflucan/nystin for 14 more days 3. outpatient EUS with Biopsy.  Discharge Diagnoses:  Principal Problem:  *Pancreatic mass Active Problems:  Chronic total occlusion of artery of the extremities  Depression  Failure to thrive  Cachexia  COPD exacerbation  Weight loss  Pain localized to upper abdomen   Discharge Condition: improved  Diet recommendation: cardiac  Filed Weights   02/04/12 0523 02/05/12 0453 02/06/12 0425  Weight: 41.7 kg (91 lb 14.9 oz) 40.2 kg (88 lb 10 oz) 41.1 kg (90 lb 9.7 oz)    History of present illness:  77 year old, 60 pack smoker. Referred for dyspnea by Dr Lenoria Farrier of [facility] for copd seen on cxr and dyspnea. He lives by himself after wife died. Estranged from Kids (sort of). He is not clear about his symptoms and there are multiple and very hard to sort out all  He had right hip fracture and s/p hip replacement by DR Magnus Ivan in July 2013 (uneventful post op). Since then multiple symptoms. Says he feels he is dying. Says he is scared of going to sleep because he feels he will be dead. HE is constantly worried and scared. Also no energy, abdominal pain, rapid weight loss (25# since July 213, 2# since yesterday) and rapid loss of independence of functionality. Feels rapid muscle loss. Also, poor appetitie. All this within past 3 weeks. Saw Dr Dulce Sellar yesterday 12/161/3 for abdominal pain. GI notes indicae patient has biliary dilatation with ampullary mass and is in need of EUS but needs pulmonary clearance. In terms of respiratory issues: asymptomatic till 3 weeks ago per hx. HE says till Weeks ago no cough and no dyspnea and quite functional. And since then developed above issues along with cough,  shortness of breath and greenish sputum all new  He also admits to significant depression and anxiety. HADS screening score 18 for anxiety and 14 for depression - which is strongly positive  However, his symptoms are too complex and multiple to give pulmonayr clearance. Hiis social situation is too complex and lot of issues need to be addrssed. He might be in AECOPD. BEst he gets admitted. I d/[facility] C[facility] Benton [facility]. PCCM will admit for 01/31/12. [facility] will assume primary 02/01/12   Hospital Course:  Acute Hyperkalemia (12/19)  Etiology uncertain-?? From meds?  K++ climbed from 4.7 on 12/17 to 6.3 on 12/19 without K++ supplementation.  Given kayexalate x 2 doses, d50 and insulin. Briefly placed on a Bicarb drip.  K 3.4 (12/20).  Wellbutrin, buspar discontinued, and replaced with Lexapro and Hydroxyzine.   Abdominal pain / Weight Loss / Pancreatic Mass  Ampullary mass being evaluated by [facility].  outpatient EUS with Biopsy.  Pulmonary Clearance given for endoscopic procedure-EUS  Weight loss 150 -> 110 over the course of several months. Unable to eat. Nutrition consult completed.    Acute on COPD  Appreciate pulmonary recommendations.  Nebs/Steroids/doxycycline  Lungs with improved rhonchi. Mild wheezing  Has been on prednisone 40 mg po for several weeks prior to admission can wean off starting 12/26   Anxiety / Depression  Psychiatry consulted.  Patient with anxiety. Recent loss of his wife. Feelings of impending death.  Started on wellbutrin, buspar, and trazodone. Due to hyperkalemia. Wellbutrin and  buspar discontinued.  Hydroxyzine and lexapro started 12/20 after d/w Psych   Chronic Pain  Severe PVD with chronic occlusion of right lower extremity (PT eval completed)  Cervical spondylosis  DDD L5 - S1  Now on long acting oxycontin  and use  Oxycodone for breakthrough pain.  Patient able ambulate short distances slowly with a walker  Palliative consult for pain and  symptom management.   GERD  Continue PPI   HIstory of Prostate Ca/Breast Ca  Quiet. No issues currently   Tobacco abuse  1/2 PPD. Nicotine patch placed.   Thrush  -diflucan  -nystantin  BC 1/2 card neg staph- most likely a contaminant  Consultations:  Palliative care  psych  Discharge Exam: Filed Vitals:   02/05/12 2137 02/05/12 2230 02/06/12 0421 02/06/12 0425  BP: 116/71  115/64   Pulse: 68  54   Temp: 98.2 F (36.8 C)  97.5 F (36.4 C)   TempSrc: Oral  Axillary   Resp: 18  18   Height:      Weight:    41.1 kg (90 lb 9.7 oz)  SpO2: 94% 94% 94%     General: A+Ox3, NAD Cardiovascular: rrr Respiratory: distant BS x2  Discharge Instructions  Discharge Orders    Future Appointments: Provider: Department: Dept Phone: Center:   02/27/2012 3:00 PM Kalman Shan, MD [facility] [phone] None     Future Orders Please Complete By Expires   Diet - low sodium heart healthy      Increase activity slowly      Discharge instructions      Comments:   diflucan for 14 more days along with nystatin Doxy for 3 more days       Medication List     As of 02/06/2012 12:26 PM    STOP taking these medications         ADVAIR DISKUS 250-50 MCG/DOSE Aepb   Generic drug: Fluticasone-Salmeterol      albuterol (2.5 MG/3ML) 0.083% nebulizer solution   Commonly known as: PROVENTIL      feeding supplement Liqd      oxyCODONE-acetaminophen 5-325 MG per tablet   Commonly known as: PERCOCET/ROXICET      TAKE these medications         albuterol (5 MG/ML) 0.5% nebulizer solution   Commonly known as: PROVENTIL   Take 0.5 mLs (2.5 mg total) by nebulization 3 (three) times daily.      ALPRAZolam 1 MG tablet   Commonly known as: XANAX   Take 1 tablet (1 mg total) by mouth at bedtime.      doxycycline 100 MG tablet   Commonly known as: VIBRA-TABS   Take 1 tablet (100 mg total) by mouth every 12 (twelve) hours.      escitalopram 10 MG tablet   Commonly  known as: LEXAPRO   Take 1 tablet (10 mg total) by mouth daily.      famotidine 20 MG tablet   Commonly known as: PEPCID   Take 1 tablet (20 mg total) by mouth 2 (two) times daily.      fluconazole 100 MG tablet   Commonly known as: DIFLUCAN   Take 1 tablet (100 mg total) by mouth daily.      guaiFENesin 600 MG 12 hr tablet   Commonly known as: MUCINEX   Take 2 tablets (1,200 mg total) by mouth 2 (two) times daily.      hydrOXYzine 25 MG tablet   Commonly known as: ATARAX/VISTARIL  Take 1 tablet (25 mg total) by mouth 2 (two) times daily.      ipratropium 0.02 % nebulizer solution   Commonly known as: ATROVENT   Take 2.5 mLs (0.5 mg total) by nebulization 3 (three) times daily.      morphine 15 MG 12 hr tablet   Commonly known as: MS CONTIN   Take 3 tablets (45 mg total) by mouth every 12 (twelve) hours.      morphine 15 MG tablet   Commonly known as: MSIR   Take 0.5 tablets (7.5 mg total) by mouth every 4 (four) hours as needed.      multivitamin tablet   Take 1 tablet by mouth daily.      nicotine 14 mg/24hr patch   Commonly known as: NICODERM CQ - dosed in mg/24 hours   Place 1 patch onto the skin daily.      nystatin 100000 UNIT/ML suspension   Commonly known as: MYCOSTATIN   Take 5 mLs (500,000 Units total) by mouth 4 (four) times daily.      pantoprazole 40 MG tablet   Commonly known as: PROTONIX   Take 1 tablet (40 mg total) by mouth daily.      polyethylene glycol packet   Commonly known as: MIRALAX / GLYCOLAX   Take 17 g by mouth 2 (two) times daily.      predniSONE 20 MG tablet   Commonly known as: DELTASONE   Take 2 tablets (40 mg total) by mouth daily with breakfast.      rosuvastatin 20 MG tablet   Commonly known as: CRESTOR   Take 20 mg by mouth every morning.           Follow-up Information    Follow up with [facility], MD. On 02/27/2012. (3:00pm )    Contact information:   [address] [place] [address] [phone]            The results of significant diagnostics from this hospitalization (including imaging, microbiology, ancillary and laboratory) are listed below for reference.    Significant Diagnostic Studies: Dg Chest 2 View  02/01/2012  *RADIOLOGY REPORT*  Clinical Data: Shortness of breath.  CHEST - 2 VIEW  Comparison: January 26, 2012.  Findings: Cardiomediastinal silhouette appears normal. Hyperexpansion of the lungs is noted consistent with chronic obstructive pulmonary disease.  No acute pulmonary disease is noted.  IMPRESSION: No acute cardiopulmonary abnormality seen.   Original Report Authenticated By: [name],  M.D.    Dg Chest 2 View  01/26/2012  *RADIOLOGY REPORT*  Clinical Data: Shortness of breath.  CHEST - 2 VIEW  Comparison: Plain films of the chest 08/14/2011.  Findings: The lungs are severely emphysematous.  No focal airspace disease or evidence of pulmonary edema is identified.  There is no pneumothorax or pleural fluid.  Heart size normal.  IMPRESSION: Severe emphysema without acute disease.   Original Report Authenticated By: Holley Dexter, M.D.    Ct Chest W Contrast  02/02/2012  *RADIOLOGY REPORT*  Clinical Data: Cough.  Chest pain.  Shortness of breath.  Personal history of prostate and skin cancer.  CT CHEST WITH CONTRAST  Technique:  Multidetector CT imaging of the chest was performed following the standard protocol during bolus administration of intravenous contrast.  Contrast: 75mL OMNIPAQUE IOHEXOL 300 MG/ML  SOLN  Comparison: None.  Findings: Severe pulmonary emphysema is noted.  No suspicious pulmonary nodules or masses are identified.  There is no evidence of pulmonary infiltrate or central  endobronchial obstruction.  No evidence of pleural or pericardial effusion.  No evidence of mediastinal or hilar masses.  No adenopathy seen elsewhere within the thorax.  No suspicious bone lesions identified.  The adrenal glands are normal in appearance.  IMPRESSION:  Severe  pulmonary emphysema.  No active disease.   Original Report Authenticated By: Myles Rosenthal, M.D.    Ct Abdomen Pelvis W Contrast  01/26/2012  *RADIOLOGY REPORT*  Clinical Data: Short of breath.  Cough.  CT ABDOMEN AND PELVIS WITH CONTRAST  Technique:  Multidetector CT imaging of the abdomen and pelvis was performed following the standard protocol during bolus administration of intravenous contrast.  Contrast: OMNIPAQUE IOHEXOL 300 MG/ML  SOLN  Comparison: CT 11/08/2007.  Findings: Lung Bases: Attenuation with Bosselation of the hemidiaphragms is present posteriorly.  Emphysema.  4 mm pulmonary nodule is present in the right lower lobe (image number 10 series 3).  Mild subpleural fibrosis in the left lower lobe.  Liver:  Intra and extrahepatic biliary ductal dilation is present. There is a small low attenuation lesion in the right hepatic lobe (image number 22 series 2) that probably represents a tiny cyst. There is no mass lesion identified.  Spleen:  Normal.  Gallbladder:  Cholecystectomy.  Common bile duct:  Massive dilation of the common bile duct, measuring up to 19 mm.  No radiopaque retained stones identified. There is a soft tissue density structure near the ampulla.  This could represent a small neoplasm or partially calcified stone. Correlation with bilirubin is recommended.  Pancreas:  Mild pancreatic ductal dilation.  No pancreatic mass lesion.  Adrenal glands:  Bilateral adrenal nodules appears similar to the comparison exam.  Kidneys:  Normal enhancement and excretion of contrast.  Tiny low density lesions probably represent small cysts but are too small to characterize.  4 mm calculus in the left interpolar collecting system.  Ureters appear within normal limits.  No ureteral calculi.  Stomach:  Within normal limits.  Small bowel:  Normal.  No mesenteric adenopathy.  No obstruction or inflammatory changes.  Colon:   Normal appendix.  Ascending colon appears normal. Transverse colon normal.   Splenic flexure decompressed. Rectosigmoid appears within normal limits. Sigmoid diverticulosis. Small perirectal rim enhancing fluid collection with gas is present (image number 88 series 5) which could represent a small fistula/diverticulum or perirectal abscess.  Clinically correlate. Abscess was identified in this region on prior examination and the lesion is much smaller than on the prior study.  Pelvic Genitourinary:  Hysterectomy.  Scatter artifact is present from right total hip arthroplasty.  Bones:  No aggressive osseous lesions.  Healed left obturator ring fractures.  Osteopenia.  Vasculature: Atherosclerosis.  Aortic mural plaque is present. There is a calcified penetrating atherosclerotic ulcer in the right side of the infrarenal abdominal aorta (image 31 series 2).  No acute aortic findings.  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  IMPRESSION: 1.  4 mm right lower lobe pulmonary nodule. If the patient is at high risk for bronchogenic carcinoma, follow-up chest CT at 1 year is recommended.  If the patient is at low risk, no follow-up is needed.  This recommendation follows the consensus statement: Guidelines for Management of Small Pulmonary Nodules Detected on CT Scans:  A Statement from the Fleischner Society as published in Radiology 2005; 237:395-400.  Emphysema. 2.  Cholecystectomy with progressive intra and extrahepatic biliary ductal dilation. Small peri ampullary soft tissue density lesion which  may represent a small partially calcified stones or small obstructing neoplasm.  Correlation with bilirubin recommended. Consider follow-up MRCP or ERCP. Non-emergent MRI should be deferred until patient has been discharged for the acute illness, and can optimally cooperate with positioning and breath-holding instructions. 3.  Nonobstructive 4 mm left interpolar renal collecting system calculus.  Probable small bilateral renal  cysts. 4.  Severe atherosclerosis.  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 5. Stable benign adrenal nodules consistent with adenoma.   Original Report Authenticated By: Andreas Newport, M.D.     Microbiology: Recent Results (from the past 240 hour(s))  CULTURE, BLOOD (ROUTINE X 2)     Status: Normal   Collection Time   01/31/12  7:30 PM      Component Value Range Status Comment   Specimen Description BLOOD RIGHT ARM   Final    Special Requests BOTTLES DRAWN AEROBIC AND ANAEROBIC 10CC EACH   Final    Culture  Setup Time 02/01/2012 01:56   Final    Culture     Final    Value: STAPHYLOCOCCUS SPECIES (COAGULASE NEGATIVE)     Note: THE SIGNIFICANCE OF ISOLATING THIS ORGANISM FROM A SINGLE SET OF BLOOD CULTURES WHEN MULTIPLE SETS ARE DRAWN IS UNCERTAIN. PLEASE NOTIFY THE MICROBIOLOGY DEPARTMENT WITHIN ONE WEEK IF SPECIATION AND SENSITIVITIES ARE REQUIRED.     Note: Gram Stain Report Called to,Read Back By and Verified With: SONIA COLUMBRE ON 02/01/2012 AT 9:39P BY WILEJ   Report Status 02/03/2012 FINAL   Final   CULTURE, BLOOD (ROUTINE X 2)     Status: Normal (Preliminary result)   Collection Time   01/31/12  7:53 PM      Component Value Range Status Comment   Specimen Description BLOOD RIGHT ARM   Final    Special Requests BOTTLES DRAWN AEROBIC AND ANAEROBIC 10CC EACH   Final    Culture  Setup Time 02/01/2012 01:56   Final    Culture     Final    Value:        BLOOD CULTURE RECEIVED NO GROWTH TO DATE CULTURE WILL BE HELD FOR 5 DAYS BEFORE ISSUING A FINAL NEGATIVE REPORT   Report Status PENDING   Incomplete   URINE CULTURE     Status: Normal   Collection Time   01/31/12 11:48 PM      Component Value Range Status Comment   Specimen Description URINE, CLEAN CATCH   Final    Special Requests NONE   Final    Culture  Setup Time 02/01/2012 00:13   Final    Colony Count NO GROWTH   Final    Culture NO GROWTH   Final    Report Status 02/02/2012 FINAL   Final      Labs: Basic Metabolic  Panel:  Lab 02/04/12 0610 02/03/12 0557 02/02/12 1649 02/02/12 1357 02/02/12 0758 02/01/12 0922 01/31/12 2006  NA 138 140 137 136 136 -- --  K 4.6 3.4* 5.7* 5.6* 6.3* -- --  CL 95* 98 96 95* 97 -- --  CO2 36* 36* 29 31 32 -- --  GLUCOSE 79 89 209* 129* 108* -- --  BUN 20 15 22 22  24* -- --  CREATININE 1.19 0.97 1.04 1.06 1.09 -- --  CALCIUM 9.4 9.4 10.0 10.3 10.1 -- --  MG -- -- -- -- -- 2.4 2.4  PHOS -- -- -- -- -- 3.4 4.5   Liver Function Tests:  Lab 02/03/12 0557 01/31/12 2006  AST 15 23  ALT 14 16  ALKPHOS 70 94  BILITOT 0.4 0.6  PROT 5.7* 7.5  ALBUMIN 3.2* 4.2    Lab 01/31/12 2006  LIPASE 17  AMYLASE 66   No results found for this basename: AMMONIA:5 in the last 168 hours CBC:  Lab 02/04/12 0610 02/03/12 0557 02/01/12 0922 01/31/12 2006  WBC 6.9 8.0 7.7 7.4  NEUTROABS -- -- -- 6.5  HGB 12.3* 13.0 13.9 14.1  HCT 36.5* 39.1 40.8 41.0  MCV 92.4 92.7 90.9 90.1  PLT 105* 121* 134* 148*   Cardiac Enzymes:  Lab 02/02/12 1357 02/01/12 0922 02/01/12 0159 01/31/12 2007  CKTOTAL 26 -- -- --  CKMB -- -- -- --  CKMBINDEX -- -- -- --  TROPONINI -- <0.30 <0.30 <0.30   BNP: BNP (last 3 results)  Basename 01/31/12 2007  PROBNP 170.0   CBG: No results found for this basename: GLUCAP:5 in the last 168 hours     Signed:  Marlin Canary  [facility] 02/06/2012, 12:26 PM

## 2012-02-06 NOTE — Progress Notes (Signed)
Clinical Social Work Department CLINICAL SOCIAL WORK PLACEMENT NOTE 02/06/2012  Patient:  JAMEY, HARMAN  Account Number:  [id number] Admit date:  01/31/2012  Clinical Social Worker:  Unk Lightning, LCSW  Date/time:  02/06/2012 10:00 AM  Clinical Social Work is seeking post-discharge placement for this patient at the following level of care:   SKILLED NURSING   (*CSW will update this form in Epic as items are completed)   02/06/2012  Patient/family provided with [facility] Department of Clinical Social Work's list of facilities offering this level of care within the geographic area requested by the patient (or if unable, by the patient's family).  02/06/2012  Patient/family informed of their freedom to choose among providers that offer the needed level of care, that participate in Medicare, Medicaid or managed care program needed by the patient, have an available bed and are willing to accept the patient.  02/06/2012  Patient/family informed of [facility]' ownership interest in [facility], as well as of the fact that they are under no obligation to receive care at this facility.  PASARR submitted to EDS on existing # PASARR number received from E[employer] on   FL2 transmitted to all facilities in geographic area requested by pt/family on  02/06/2012 FL2 transmitted to all facilities within larger geographic area on   Patient informed that his/her managed care company has contracts with or will negotiate with  certain facilities, including the following:     Patient/family informed of bed offers received:   Patient chooses bed at  Physician recommends and patient chooses bed at    Patient to be transferred to  on   Patient to be transferred to facility by   The following physician request were entered in Epic:   Additional Comments:

## 2012-02-06 NOTE — Progress Notes (Signed)
Physical Therapy Treatment Patient Details Name: [name] MRN: [id number] DOB: [birth year] Today's Date: 02/06/2012 Time: 4098-1191 PT Time Calculation (min): 8 min  PT Assessment / Plan / Recommendation Comments on Treatment Session  Pt adm with pancreatic mass.  Pt with decr mobility due to painful feet and deconditioning.  Pt now will need SNF since his mobility has declined and he has no support at home.    Follow Up Recommendations  SNF     Does the patient have the potential to tolerate intense rehabilitation     Barriers to Discharge        Equipment Recommendations  None recommended by PT    Recommendations for Other Services    Frequency Min 3X/week   Plan Discharge plan needs to be updated;Frequency remains appropriate    Precautions / Restrictions Precautions Precautions: Fall   Pertinent Vitals/Pain 10/10 bil foot pain    Mobility  Bed Mobility Supine to Sit: 7: Independent Sitting - Scoot to Edge of Bed: 7: Independent Sit to Supine: 7: Independent Transfers Sit to Stand: 4: Min assist;With upper extremity assist;From bed Stand to Sit: 4: Min assist;With upper extremity assist;To bed Details for Transfer Assistance: Pt needed assist for balance and to raise hips due to painful wt bearing on feet. Ambulation/Gait Ambulation/Gait Assistance: 4: Min assist Ambulation Distance (Feet): 100 Feet Assistive device: Rolling walker Ambulation/Gait Assistance Details: Pt with difficulty clearing rt foot during swing.  Rt foot scuffing the ground putting pt at risk to fall. Gait Pattern: Step-through pattern;Decreased stride length;Antalgic Gait velocity: decr    Exercises     PT Diagnosis:    PT Problem List:   PT Treatment Interventions:     PT Goals Acute Rehab PT Goals PT Goal: Sit to Stand - Progress: Not progressing PT Goal: Stand to Sit - Progress: Not progressing PT Goal: Ambulate - Progress: Not progressing  Visit Information  Last PT Received  On: 02/06/12 Assistance Needed: +1    Subjective Data  Subjective: Pt c/o severe bil neuropathic foot pain.   Cognition  Overall Cognitive Status: Appears within functional limits for tasks assessed/performed Behavior During Session: [facility] for tasks performed    Balance  Static Standing Balance Static Standing - Balance Support: Bilateral upper extremity supported (on walker) Static Standing - Level of Assistance: 5: Stand by assistance  End of Session PT - End of Session Activity Tolerance: Patient limited by pain Patient left: in bed;with call bell/phone within reach Nurse Communication: Mobility status   GP     [facility] 02/06/2012, 10:00 AM  [facility] PT [phone]

## 2012-02-07 LAB — CULTURE, BLOOD (ROUTINE X 2): Culture: NO GROWTH

## 2012-02-14 ENCOUNTER — Institutional Professional Consult (permissible substitution): Payer: Medicare Other | Admitting: Pulmonary Disease

## 2012-02-21 ENCOUNTER — Encounter (HOSPITAL_COMMUNITY): Payer: Self-pay | Admitting: *Deleted

## 2012-02-21 ENCOUNTER — Encounter (HOSPITAL_COMMUNITY): Payer: Self-pay | Admitting: Pharmacy Technician

## 2012-02-21 NOTE — Pre-Procedure Instructions (Addendum)
Your procedure is scheduled on:Wednesday, February 22, 2012 Report to [facility] Admitting AV:[id number] Spoke with the Director of Nursing for [facility] Mrs. Olu re: faxing pre procedure instructions for Endoscopy procedure for anesthesiology department per nursing history and ,medication intake.   Call this number if you have problems morning of your procedure:[phone]  Follow all bowel prep instructions per your doctor's orders.  Do not eat or drink anything after midnight the night before your procedure. You may brush your teeth, rinse out your mouth, but no water, no food, no chewing gum, no mints, no candies, no chewing tobacco.     Take these medicines the morning of your procedure with A SIP OF WATER:Pepcid,Pantopanzole,Prednisone,may take Morphine IR if needed, use Albuterol.   Please make arrangements for a responsible person to drive you home after the procedure. You cannot go home by cab/taxi. We recommend you have someone with you at home the first 24 hours after your procedure. Driver for procedure is son Shella Maxim to meet him and transportation staff at [facility]  LEAVE ALL VALUABLES, JEWELRY, BILLFOLD AT HOME.  NO DENTURES, CONTACT LENSES ALLOWED IN THE ENDOSCOPY ROOM.   YOU MAY WEAR DEODORANT, PLEASE REMOVE ALL JEWELRY, WATCHES RINGS, BODY PIERCINGS AND LEAVE AT HOME.   WOMEN: NO MAKE-UP, LOTIONS PERFUMES

## 2012-02-22 ENCOUNTER — Encounter (HOSPITAL_COMMUNITY): Payer: Self-pay | Admitting: *Deleted

## 2012-02-22 ENCOUNTER — Encounter (HOSPITAL_COMMUNITY)
Admission: RE | Disposition: A | Discharge status: Home / Self Care | Payer: Self-pay | Source: Ambulatory Visit | Attending: Gastroenterology

## 2012-02-22 ENCOUNTER — Ambulatory Visit (HOSPITAL_COMMUNITY): Payer: Medicare Other | Admitting: Anesthesiology

## 2012-02-22 ENCOUNTER — Encounter (HOSPITAL_COMMUNITY): Payer: Self-pay | Admitting: Anesthesiology

## 2012-02-22 ENCOUNTER — Ambulatory Visit (HOSPITAL_COMMUNITY)
Admission: RE | Admit: 2012-02-22 | Discharge: 2012-02-22 | Disposition: A | Discharge status: Home / Self Care | Payer: Medicare Other | Source: Ambulatory Visit | Attending: Gastroenterology | Admitting: Gastroenterology

## 2012-02-22 DIAGNOSIS — R109 Unspecified abdominal pain: Secondary | ICD-10-CM | POA: Insufficient documentation

## 2012-02-22 DIAGNOSIS — R634 Abnormal weight loss: Secondary | ICD-10-CM | POA: Insufficient documentation

## 2012-02-22 DIAGNOSIS — K838 Other specified diseases of biliary tract: Secondary | ICD-10-CM | POA: Insufficient documentation

## 2012-02-22 DIAGNOSIS — R935 Abnormal findings on diagnostic imaging of other abdominal regions, including retroperitoneum: Secondary | ICD-10-CM | POA: Insufficient documentation

## 2012-02-22 HISTORY — PX: ESOPHAGOGASTRODUODENOSCOPY: SHX5428

## 2012-02-22 HISTORY — PX: EUS: SHX5427

## 2012-02-22 HISTORY — DX: Gastro-esophageal reflux disease without esophagitis: K21.9

## 2012-02-22 LAB — COMPREHENSIVE METABOLIC PANEL
Alkaline Phosphatase: 84 U/L (ref 39–117)
BUN: 19 mg/dL (ref 6–23)
CO2: 32 mEq/L (ref 19–32)
Chloride: 93 mEq/L — ABNORMAL LOW (ref 96–112)
Creatinine, Ser: 1.09 mg/dL (ref 0.50–1.35)
GFR calc Af Amer: 75 mL/min — ABNORMAL LOW (ref >90)
GFR calc non Af Amer: 64 mL/min — ABNORMAL LOW (ref >90)
Glucose, Bld: 110 mg/dL — ABNORMAL HIGH (ref 70–99)
Potassium: 4.6 mEq/L (ref 3.5–5.1)
Total Bilirubin: 0.3 mg/dL (ref 0.3–1.2)

## 2012-02-22 LAB — CANCER ANTIGEN 19-9: CA 19-9: 16.3 U/mL — ABNORMAL LOW (ref <35.0)

## 2012-02-22 SURGERY — EGD (ESOPHAGOGASTRODUODENOSCOPY)
Anesthesia: Monitor Anesthesia Care

## 2012-02-22 MED ORDER — PROPOFOL 10 MG/ML IV EMUL
INTRAVENOUS | Status: DC | PRN
  Administered 2012-02-22: 100 ug/kg/min via INTRAVENOUS

## 2012-02-22 MED ORDER — PROMETHAZINE HCL 25 MG/ML IJ SOLN: 6.2500 mg | INTRAMUSCULAR | Status: DC | PRN

## 2012-02-22 MED ORDER — LIDOCAINE HCL (CARDIAC) 20 MG/ML IV SOLN
INTRAVENOUS | Status: DC | PRN
  Administered 2012-02-22: 75 mg via INTRAVENOUS

## 2012-02-22 MED ORDER — FENTANYL CITRATE 0.05 MG/ML IJ SOLN
INTRAMUSCULAR | Status: DC | PRN
  Administered 2012-02-22 (×2): 50 ug via INTRAVENOUS

## 2012-02-22 MED ORDER — LACTATED RINGERS IV SOLN
INTRAVENOUS | Status: DC | PRN
  Administered 2012-02-22: 11:00:00 via INTRAVENOUS

## 2012-02-22 MED ORDER — LACTATED RINGERS IV SOLN: INTRAVENOUS | Status: DC

## 2012-02-22 MED ORDER — BUTAMBEN-TETRACAINE-BENZOCAINE 2-2-14 % EX AERO
INHALATION_SPRAY | CUTANEOUS | Status: DC | PRN
  Administered 2012-02-22: 2 via TOPICAL

## 2012-02-22 MED ORDER — SODIUM CHLORIDE 0.9 % IV SOLN: INTRAVENOUS | Status: DC

## 2012-02-22 MED ORDER — MIDAZOLAM HCL 5 MG/5ML IJ SOLN
INTRAMUSCULAR | Status: DC | PRN
  Administered 2012-02-22 (×2): 0.5 mg via INTRAVENOUS

## 2012-02-22 NOTE — Anesthesia Preprocedure Evaluation (Addendum)
Anesthesia Evaluation  Patient identified by MRN, date of birth, ID band Patient awake    Reviewed: Allergy & Precautions, H&P , NPO status , Patient's Chart, lab work & pertinent test results, reviewed documented beta blocker date and time   History of Anesthesia Complications Negative for: history of anesthetic complications  Airway Mallampati: II  Neck ROM: Full    Dental  (+) Missing, Edentulous Upper, Partial Lower and Dental Advisory Given   Pulmonary pneumonia -, COPD COPD inhaler, Current Smoker,  breath sounds clear to auscultation        Cardiovascular hypertension, + Peripheral Vascular Disease (Iliac artery stent) negative cardio ROS  Rhythm:Regular Rate:Normal     Neuro/Psych PSYCHIATRIC DISORDERS Anxiety Depression  Neuromuscular disease    GI/Hepatic Neg liver ROS,   Endo/Other    Renal/GU negative Renal ROS   Prostate CA, Rdx    Musculoskeletal  (+) Arthritis -, Rheumatoid disorders,    Abdominal   Peds  Hematology   Anesthesia Other Findings   Reproductive/Obstetrics                         Anesthesia Physical Anesthesia Plan  ASA: III  Anesthesia Plan: MAC   Post-op Pain Management:    Induction: Intravenous  Airway Management Planned: Simple Face Mask  Additional Equipment:   Intra-op Plan:   Post-operative Plan:   Informed Consent: I have reviewed the patients History and Physical, chart, labs and discussed the procedure including the risks, benefits and alternatives for the proposed anesthesia with the patient or authorized representative who has indicated his/her understanding and acceptance.   Dental advisory given  Plan Discussed with: CRNA  Anesthesia Plan Comments:         Anesthesia Quick Evaluation

## 2012-02-22 NOTE — Anesthesia Postprocedure Evaluation (Signed)
Anesthesia Post Note  Patient: [name]  Procedure(s) Performed: Procedure(s) (LRB): ESOPHAGOGASTRODUODENOSCOPY (EGD) (N/A) UPPER ENDOSCOPIC ULTRASOUND (EUS) RADIAL (N/A)  Anesthesia type: MAC  Patient location: PACU  Post pain: Pain level controlled  Post assessment: Post-op Vital signs reviewed  Last Vitals:  Filed Vitals:   02/22/12 1250  BP: 146/71  Temp:   Resp: 18    Post vital signs: Reviewed  Level of consciousness: sedated  Complications: No apparent anesthesia complications

## 2012-02-22 NOTE — Transfer of Care (Signed)
Immediate Anesthesia Transfer of Care Note  Patient: [name]  Procedure(s) Performed: Procedure(s) (LRB) with comments: ESOPHAGOGASTRODUODENOSCOPY (EGD) (N/A) UPPER ENDOSCOPIC ULTRASOUND (EUS) RADIAL (N/A)  Patient Location: PACU  Anesthesia Type:MAC  Level of Consciousness: sedated  Airway & Oxygen Therapy: Patient Spontanous Breathing and Patient connected to nasal cannula oxygen  Post-op Assessment: Report given to PACU RN and Post -op Vital signs reviewed and stable  Post vital signs: stable  Complications: No apparent anesthesia complications

## 2012-02-22 NOTE — H&P (Signed)
Patient interval history reviewed.  Patient examined again.  There has been no change from documented H/P dated 02/20/12 (scanned into chart from our office) except as documented above.  Assessment:  1.  Unintentional weight loss. 2.  Dilated bile duct (normal LFTs). 3.  Abnormal CT scan (possible soft tissue in region of ampulla or pancreatic head).  Plan:  1.  Endoscopy with possible endoscopic ultrasound (with possible fine needle aspiration). 2.  Risks (bleeding, infection, bowel perforation that could require surgery, sedation-related changes in cardiopulmonary systems), benefits (identification and possible treatment of source of symptoms, exclusion of certain causes of symptoms), and alternatives (watchful waiting, radiographic imaging studies, empiric medical treatment) of upper endoscopy (EGD) were explained to patient in detail and she wishes to proceed.

## 2012-02-22 NOTE — Op Note (Signed)
[facility] [address] [place], [address]   ENDOSCOPIC ULTRASOUND PROCEDURE REPORT  PATIENT: Grantley, Savage  MR#: [id number] BIRTHDATE: 07-04-1934  GENDER: Male ENDOSCOPIST: Willis Modena, MD REFERRED BY:  Blair Heys, M.D. PROCEDURE DATE:  02/22/2012 PROCEDURE:   Upper EUS ASA CLASS:      Class III INDICATIONS:   1.  abdominal pain; dilated bile duct; abnormal CT abdomen. MEDICATIONS: MAC sedation, administered by CRNA and Cetacaine spray x 2  DESCRIPTION OF PROCEDURE:   After the risks benefits and alternatives of the procedure were  explained, informed consent was obtained. The patient was then placed in the left, lateral, decubitus postion and IV sedation was administered. Throughout the procedure, the patients blood pressure, pulse and oxygen saturations were monitored continuously.  Under direct visualization, the     endoscope was introduced through the mouth and advanced to the second portion of the duodenum .  Water was used as necessary to provide an acoustic interface.  Upon completion of the imaging, water was removed and the patient was sent to the recovery room in satisfactory condition.    FINDINGS:      EGD:  Ampullary orifice a bit erythematous, but no obvious ampullary mass on upper endoscopy; EGD otherwise normal. EUS:  Radial scope used.  Bile duct and pancreatic duct diffusely dilated.  No bile duct wall thickening and no bile duct stones seen. I did not discern any obvious pancreatic mass.  There did appear to be a bit of very subtle periampullary/peripancreatic soft tissue either growing within the distal bile duct (not favored) or extrinsically compressing the bile duct (favored).  Extensive atherosclerosis.  No adenopathy.  IMPRESSION:     As above.  No obvious explanation for patient's symptoms identified.  Periampullary soft tissue prominence is quite subtle and I felt risks of biopsy far outweighed the  benefits. Unclear source of symptoms.  Considerations including latent malignancy versus chronic mesenteric ischemia.  RECOMMENDATIONS:     1.  Watch for potential complications of procedure. 2.  Labs today:  CMP, Ca 19-9, CEA 3.  Consider PET-CT scan to further evaluate patient's weight loss. 4.  Outpatient follow-up with [facility] once above testing done.   _______________________________ eSigned:  Willis Modena, MD 02/22/2012 12:19 PM   CC:

## 2012-02-22 NOTE — Preoperative (Signed)
Beta Blockers   Reason not to administer Beta Blockers:Not Applicable 

## 2012-02-23 ENCOUNTER — Encounter (HOSPITAL_COMMUNITY): Payer: Self-pay | Admitting: Gastroenterology

## 2012-02-26 NOTE — Consult Note (Signed)
Agree with above 

## 2012-02-27 ENCOUNTER — Other Ambulatory Visit (HOSPITAL_COMMUNITY): Payer: Self-pay | Admitting: Gastroenterology

## 2012-02-27 ENCOUNTER — Ambulatory Visit (INDEPENDENT_AMBULATORY_CARE_PROVIDER_SITE_OTHER): Payer: Medicare Other | Admitting: Internal Medicine

## 2012-02-27 ENCOUNTER — Other Ambulatory Visit: Payer: Medicare Other

## 2012-02-27 ENCOUNTER — Encounter: Payer: Self-pay | Admitting: Internal Medicine

## 2012-02-27 VITALS — BP 112/64 | HR 87 | Temp 97.6°F | Ht 68.0 in | Wt 113.4 lb

## 2012-02-27 DIAGNOSIS — R109 Unspecified abdominal pain: Secondary | ICD-10-CM

## 2012-02-27 DIAGNOSIS — R634 Abnormal weight loss: Secondary | ICD-10-CM

## 2012-02-27 DIAGNOSIS — J449 Chronic obstructive pulmonary disease, unspecified: Secondary | ICD-10-CM

## 2012-02-27 NOTE — Progress Notes (Signed)
Subjective:    Patient ID: [name], male    DOB: [birth year], 78 y.o.   MRN: [id number]  HPI Followup copd - midst of depression, Gi issues (pancreatic mass ?), anxiety, smoking, ortho surgery, deconditioning, social isolation  Now better  - weight, mood, conditioning. Now in SNF rehab. COPD is stable. Will be dc'ed home in few days with home PT. He has not active resp complaints that are worse. Overall he is getting better. GI workup in progress; some pancreatic mass. PET scan pending per hx. He is on pureed diet. Uptodate with flu and pneumovax. He is off smoking since going to [employer]. COPD CAT score is 32    reports that he quit smoking about 3 weeks ago. His smoking use included Cigarettes. He has a 60 pack-year smoking history. He has never used smokeless tobacco.    CAT COPD Symptom & Quality of Life Score (GSK trademark) 0 is no burden. 5 is highest burden 02/27/2012   Never Cough -> Cough all the time 2  No phlegm in chest -> Chest is full of phlegm 3  No chest tightness -> Chest feels very tight 4  No dyspnea for 1 flight stairs/hill -> Very dyspneic for 1 flight of stairs 5  No limitations for ADL at home -> Very limited with ADL at home 5  Confident leaving home -> Not at all confident leaving home 5  Sleep soundly -> Do not sleep soundly because of lung condition 4  Lots of Energy -> No energy at all 4  TOTAL Score (max 40)  32      Review of Systems  Constitutional: Negative for fever and unexpected weight change.  HENT: Negative for ear pain, nosebleeds, congestion, sore throat, rhinorrhea, sneezing, trouble swallowing, dental problem, postnasal drip and sinus pressure.   Eyes: Negative for redness and itching.  Respiratory: Positive for shortness of breath. Negative for cough, chest tightness and wheezing.   Cardiovascular: Negative for palpitations and leg swelling.  Gastrointestinal: Negative for nausea and vomiting.  Genitourinary: Negative for dysuria.    Musculoskeletal: Negative for joint swelling.  Skin: Negative for rash.  Neurological: Negative for headaches.  Hematological: Does not bruise/bleed easily.  Psychiatric/Behavioral: Negative for dysphoric mood. The patient is not nervous/anxious.        Objective:   Physical Exam  Nursing note and vitals reviewed. Constitutional: He is oriented to person, place, and time. He appears well-developed and well-nourished. No distress.       Body mass index is 17.24 kg/(m^2). Deconditioned looking male but looking much better   HENT:  Head: Normocephalic and atraumatic.  Right Ear: External ear normal.  Left Ear: External ear normal.  Mouth/Throat: Oropharynx is clear and moist. No oropharyngeal exudate.  Eyes: Conjunctivae normal and EOM are normal. Pupils are equal, round, and reactive to light. Right eye exhibits no discharge. Left eye exhibits no discharge. No scleral icterus.  Neck: Normal range of motion. Neck supple. No JVD present. No tracheal deviation present. No thyromegaly present.  Cardiovascular: Normal rate, regular rhythm and intact distal pulses.  Exam reveals no gallop and no friction rub.   No murmur heard. Pulmonary/Chest: Effort normal and breath sounds normal. No respiratory distress. He has no wheezes. He has no rales. He exhibits no tenderness.       barrell chest  Abdominal: Soft. Bowel sounds are normal. He exhibits no distension and no mass. There is no tenderness. There is no rebound and no guarding.  Musculoskeletal:  Normal range of motion. He exhibits no edema and no tenderness.       Rt LE issues  Lymphadenopathy:    He has no cervical adenopathy.  Neurological: He is alert and oriented to person, place, and time. He has normal reflexes. No cranial nerve deficit. Coordination normal.       Has walker  Skin: Skin is warm and dry. No rash noted. He is not diaphoretic. No erythema. No pallor.  Psychiatric: He has a normal mood and affect. His behavior is  normal. Judgment and thought content normal.       Positive mood. Depressive affect resolved          Assessment & Plan:

## 2012-02-27 NOTE — Patient Instructions (Addendum)
Glad you are better Copd disease state is stable Continue current medication regimen Continue PT/rehab Do alpha 1 genetic test for copd today 02/27/2012 Return in 3 months with pft test at followup to see severity of disease

## 2012-02-28 NOTE — Assessment & Plan Note (Signed)
Glad you are better Copd disease state is stable Continue current medication regimen Continue PT/rehab Do alpha 1 genetic test for copd today 02/27/2012 Return in 3 months with pft test at followup to see severity of disea

## 2012-03-03 LAB — ALPHA-1 ANTITRYPSIN PHENOTYPE: A-1 Antitrypsin: 149 mg/dL (ref 83–199)

## 2012-03-05 ENCOUNTER — Encounter (HOSPITAL_COMMUNITY)
Admission: RE | Admit: 2012-03-05 | Discharge: 2012-03-05 | Disposition: A | Discharge status: Home / Self Care | Payer: Medicare Other | Source: Ambulatory Visit | Attending: Gastroenterology | Admitting: Gastroenterology

## 2012-03-05 DIAGNOSIS — R109 Unspecified abdominal pain: Secondary | ICD-10-CM | POA: Insufficient documentation

## 2012-03-05 DIAGNOSIS — R634 Abnormal weight loss: Secondary | ICD-10-CM | POA: Insufficient documentation

## 2012-03-05 DIAGNOSIS — K838 Other specified diseases of biliary tract: Secondary | ICD-10-CM | POA: Insufficient documentation

## 2012-03-05 DIAGNOSIS — R935 Abnormal findings on diagnostic imaging of other abdominal regions, including retroperitoneum: Secondary | ICD-10-CM | POA: Insufficient documentation

## 2012-03-05 MED ORDER — FLUDEOXYGLUCOSE F - 18 (FDG) INJECTION
16.8000 | Freq: Once | INTRAVENOUS | Status: AC | PRN
  Administered 2012-03-05: 16.8 via INTRAVENOUS

## 2012-05-20 ENCOUNTER — Telehealth: Payer: Self-pay | Admitting: Internal Medicine

## 2012-05-20 NOTE — Telephone Encounter (Signed)
Alpha 15 Feb 2012  - levels normal. One gene is normal is M. 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 Therefore, important he stay quit with smoking and never go back otherwise at risk for accelerated loss of lung function. IF he has kids who smoke, they need to know that and need to talk to their doctor to get themselves tsted for alpha 1 to make sure they have not inherited the bad genes   Dr. Kalman Shan, M.D., [facility].C.P Pulmonary and Critical Care Medicine Staff Physician [facility] Pager: [phone], If no answer or between  15:00h - 7:00h: call [phone]  05/20/2012 11:16 AM

## 2012-05-21 NOTE — Telephone Encounter (Signed)
Pt aware of results. [name], CMA  

## 2012-05-23 ENCOUNTER — Other Ambulatory Visit: Payer: Self-pay

## 2012-05-23 MED ORDER — OXYCODONE HCL 30 MG PO TABS: 30.0000 mg | ORAL_TABLET | ORAL | Status: DC | PRN

## 2012-05-23 NOTE — Telephone Encounter (Signed)
Patient called to request refill on Roxicodone.  He would like to pick it up when ready.

## 2012-06-20 ENCOUNTER — Other Ambulatory Visit: Payer: Self-pay

## 2012-06-20 MED ORDER — OXYCODONE HCL 30 MG PO TABS: 30.0000 mg | ORAL_TABLET | ORAL | Status: DC | PRN

## 2012-06-20 NOTE — Telephone Encounter (Signed)
Patient called requesting a refill on Oxycodone.  He would like to pick it up when it's ready.  Call back number [phone].

## 2012-06-25 ENCOUNTER — Ambulatory Visit (INDEPENDENT_AMBULATORY_CARE_PROVIDER_SITE_OTHER): Payer: Medicare Other | Admitting: Internal Medicine

## 2012-06-25 ENCOUNTER — Encounter: Payer: Self-pay | Admitting: Internal Medicine

## 2012-06-25 VITALS — BP 100/60 | HR 65 | Temp 97.7°F | Ht 68.0 in | Wt 116.0 lb

## 2012-06-25 DIAGNOSIS — J449 Chronic obstructive pulmonary disease, unspecified: Secondary | ICD-10-CM

## 2012-06-25 DIAGNOSIS — F329 Major depressive disorder, single episode, unspecified: Secondary | ICD-10-CM

## 2012-06-25 LAB — PULMONARY FUNCTION TEST
DL/VA: 1.9 ml/min/mmHg/L
DLCO unc % pred: 36 %
FEF2575-%Pred-Post: 20 %
FEV1-Post: 1.17 L
FEV1-Pre: 1.11 L
FEV1FVC-%Pred-Pre: 46 %
FEV6-Post: 2.74 L
FEV6FVC-%Change-Post: 1 %
FEV6FVC-%Pred-Pre: 85 %
Pre FEV6/FVC Ratio: 79 %

## 2012-06-25 MED ORDER — SERTRALINE HCL 50 MG PO TABS: 50.0000 mg | ORAL_TABLET | Freq: Every day | ORAL | Status: DC

## 2012-06-25 NOTE — Patient Instructions (Addendum)
#  DEpression/Anxiety  - start zoloft 50mg daily  - return in 4 weeks to see NP and depending on course will increase dose  #COPD  - severe copd  - contnue advair - start spiriva  1 puff daily ; learn technique  = use nebulizer only if needed  #Followup 1 month fu with NP to do med calendar and review depression 

## 2012-06-25 NOTE — Progress Notes (Signed)
Subjective:    Patient ID: [name], male    DOB: 1934-11-29, 78 y.o.   MRN: [id number]  HPI Followup copd - midst of depression, Gi issues (pancreatic mass ?), anxiety, smoking, ortho surgery, deconditioning, social isolation  Now better  - weight, mood, conditioning. Now in SNF rehab. COPD is stable. Will be dc'ed home in few days with home PT. He has not active resp complaints that are worse. Overall he is getting better. GI workup in progress; some pancreatic mass. PET scan pending per hx. He is on pureed diet. Uptodate with flu and pneumovax. He is off smoking since going to [employer]. COPD CAT score is 32    reports that he quit smoking about 3 weeks ago. His smoking use included Cigarettes. He has a 60 pack-year smoking history. He has never used smokeless tobacco.  REC Glad you are better  Copd disease state is stable  Continue current medication regimen  Continue PT/rehab  Do alpha 1 genetic test for copd today 02/27/2012 - >LIKELY MS with a level 149mg % Return in 3 months with pft test at followup to see severity of disease   OV 06/25/2012  Followup for COPD. In terms of COPD he is doing well. Symptoms are stable. COPD cat score is 28 and improved from 32 and January 2014. His weight is also improved. Spirometry 06/25/12"  fev1 Pos BD fev1 1.17L/42% (+5% bd response), FVC 3.4L/88%, R 34, DLCO 36% and is consistent with Gold stage III COPD. He is on Symbicort not on Spiriva and is using nebulizers randomly   His main issues that he appears without any motivation. I cannot figure it is depressed anxious. He spent most of the time in the office complaining about social isolation and the feeling that he has been disowned by his kids. I checked the HADS depression and anxiety scale and he scored indeterminate for both the overall he admits to some amount of poor mood  Past, Family, Social reviewed: no change since last visit    CAT COPD Symptom & Quality of Life Score (GSK  trademark) 0 is no burden. 5 is highest burden 02/27/2012  06/25/2012   Never Cough -> Cough all the time 2 1  No phlegm in chest -> Chest is full of phlegm 3 3  No chest tightness -> Chest feels very tight 4 3  No dyspnea for 1 flight stairs/hill -> Very dyspneic for 1 flight of stairs 5 5  No limitations for ADL at home -> Very limited with ADL at home 5 5  Confident leaving home -> Not at all confident leaving home 5 3  Sleep soundly -> Do not sleep soundly because of lung condition 4 4  Lots of Energy -> No energy at all 4 4  TOTAL Score (max 40)  32 28  BMI  Body mass index is 17.64 kg/(m^2).   weight 113$ Filed Weights   06/25/12 1617  Weight: 116 lb (52.617 kg)            Review of Systems  Constitutional: Negative for fever and unexpected weight change.  HENT: Positive for trouble swallowing. Negative for ear pain, nosebleeds, congestion, sore throat, rhinorrhea, sneezing, dental problem, postnasal drip and sinus pressure.   Eyes: Negative for redness and itching.  Respiratory: Positive for shortness of breath. Negative for cough, chest tightness and wheezing.   Cardiovascular: Negative for palpitations and leg swelling.  Gastrointestinal: Negative for nausea and vomiting.  Genitourinary: Negative for dysuria.  Musculoskeletal: Negative for joint swelling.  Skin: Negative for rash.  Neurological: Negative for headaches.  Hematological: Does not bruise/bleed easily.  Psychiatric/Behavioral: Negative for dysphoric mood. The patient is not nervous/anxious.    Current outpatient prescriptions:albuterol (PROVENTIL) (5 MG/ML) 0.5% nebulizer solution, Take 2.5 mg by nebulization 3 (three) times daily. , Disp: , Rfl: ;  ALPRAZolam (XANAX) 1 MG tablet, Take 1 mg by mouth at bedtime as needed., Disp: , Rfl: ;  ipratropium (ATROVENT) 0.02 % nebulizer solution, Take 0.5 mg by nebulization 4 (four) times daily., Disp: , Rfl:  oxycodone (ROXICODONE) 30 MG immediate release tablet,  Take 1 tablet (30 mg total) by mouth every 3 (three) hours as needed. MUST LAST 28 DAYS, Disp: 200 tablet, Rfl: 0;  predniSONE (DELTASONE) 10 MG tablet, Take 20 mg by mouth daily., Disp: , Rfl: ;  rosuvastatin (CRESTOR) 20 MG tablet, Take 20 mg by mouth every morning., Disp: , Rfl:      Objective:   Physical Exam Nursing note and vitals reviewed. Constitutional: He is oriented to person, place, and time. He appears well-developed and well-nourished. No distress.    Body mass index is 17.64 kg/(m^2).  Deconditioned looking male but looking much better   HENT:  Head: Normocephalic and atraumatic.  Right Ear: External ear normal.  Left Ear: External ear normal.  Mouth/Throat: Oropharynx is clear and moist. No oropharyngeal exudate.  Eyes: Conjunctivae normal and EOM are normal. Pupils are equal, round, and reactive to light. Right eye exhibits no discharge. Left eye exhibits no discharge. No scleral icterus.  Neck: Normal range of motion. Neck supple. No JVD present. No tracheal deviation present. No thyromegaly present.  Cardiovascular: Normal rate, regular rhythm and intact distal pulses.  Exam reveals no gallop and no friction rub.   No murmur heard. Pulmonary/Chest: Effort normal and breath sounds normal. No respiratory distress. He has no wheezes. He has no rales. He exhibits no tenderness.       barrell chest  Abdominal: Soft. Bowel sounds are normal. He exhibits no distension and no mass. There is no tenderness. There is no rebound and no guarding.  Musculoskeletal: Normal range of motion. He exhibits no edema and no tenderness.       Rt LE issues  Lymphadenopathy:    He has no cervical adenopathy.  Neurological: He is alert and oriented to person, place, and time. He has normal reflexes. No cranial nerve deficit. Coordination normal.       Has walker  Skin: Skin is warm and dry. No rash noted. He is not diaphoretic. No erythema. No pallor.  Psychiatric: He has a normal mood and  affect. His behavior is normal. Judgment and thought content normal.       Positive mood. Depressive affect resolved           Assessment & Plan:

## 2012-06-25 NOTE — Progress Notes (Signed)
Spirometry before and after and DLCO  Done today pt unable to do lung volumes.

## 2012-07-04 NOTE — Assessment & Plan Note (Signed)
I suspect his depression and anxiety. His insurance issues. So start him on Zoloft 50 mg once a day recheck HADS score in 1 month and consider increasing it 100 mg daily

## 2012-07-04 NOTE — Assessment & Plan Note (Signed)
#  DEpression/Anxiety  - start zoloft 50mg  daily  - return in 4 weeks to see NP and depending on course will increase dose  #COPD  - severe copd  - contnue advair - start spiriva  1 puff daily ; learn technique  = use nebulizer only if needed  #Followup 1 month fu with NP to do med calendar and review depression

## 2012-07-11 ENCOUNTER — Encounter: Payer: Self-pay | Admitting: Internal Medicine

## 2012-07-17 ENCOUNTER — Other Ambulatory Visit: Payer: Self-pay

## 2012-07-17 MED ORDER — OXYCODONE HCL 30 MG PO TABS: 30.0000 mg | ORAL_TABLET | ORAL | Status: DC | PRN

## 2012-07-17 NOTE — Telephone Encounter (Signed)
Patient called requesting refill on Oxycodone.  Call back number [phone] or [phone]

## 2012-07-19 ENCOUNTER — Telehealth: Payer: Self-pay | Admitting: Internal Medicine

## 2012-07-19 MED ORDER — TIOTROPIUM BROMIDE MONOHYDRATE 18 MCG IN CAPS: 18.0000 ug | ORAL_CAPSULE | Freq: Every day | RESPIRATORY_TRACT | Status: DC

## 2012-07-19 NOTE — Telephone Encounter (Signed)
Pt was seen by MR on 06/25/12 with the following pt instructions:  Patient Instructions    #DEpression/Anxiety  - start zoloft 50mg  daily  - return in 4 weeks to see NP and depending on course will increase dose  #COPD  - severe copd  - contnue advair  - start spiriva 1 puff daily ; learn technique  = use nebulizer only if needed  #Followup  1 month fu with NP to do med calendar and review depression   -----  Called, spoke with pt.  He would like a spiriva sample.  1 sample placed at front for pick up.  Pt aware.  He is also aware of pending OV with TP on August 02, 2012.  He voiced no further questions or concerns at this time.

## 2012-08-02 ENCOUNTER — Encounter: Payer: Self-pay | Admitting: Adult Health

## 2012-08-02 ENCOUNTER — Ambulatory Visit (INDEPENDENT_AMBULATORY_CARE_PROVIDER_SITE_OTHER): Payer: Medicare Other | Admitting: Adult Health

## 2012-08-02 VITALS — BP 118/72 | HR 81 | Temp 98.1°F | Ht 69.0 in | Wt 121.0 lb

## 2012-08-02 DIAGNOSIS — J449 Chronic obstructive pulmonary disease, unspecified: Secondary | ICD-10-CM

## 2012-08-02 MED ORDER — TIOTROPIUM BROMIDE MONOHYDRATE 18 MCG IN CAPS: 18.0000 ug | ORAL_CAPSULE | Freq: Every day | RESPIRATORY_TRACT | Status: DC

## 2012-08-02 NOTE — Patient Instructions (Addendum)
Continue on current regimen  Follow med calendar closely and bring to each visit.  follow up Dr. Marchelle Gearing in 6 -8 weeks and As needed

## 2012-08-07 NOTE — Progress Notes (Signed)
Subjective:    Patient ID: [name], male    DOB: 03-09-1934, 78 y.o.   MRN: [id number]  HPI Followup copd - midst of depression, Gi issues (pancreatic mass ?), anxiety, smoking, ortho surgery, deconditioning, social isolation  Now better  - weight, mood, conditioning. Now in SNF rehab. COPD is stable. Will be dc'ed home in few days with home PT. He has not active resp complaints that are worse. Overall he is getting better. GI workup in progress; some pancreatic mass. PET scan pending per hx. He is on pureed diet. Uptodate with flu and pneumovax. He is off smoking since going to [employer]. COPD CAT score is 32    reports that he quit smoking about 3 weeks ago. His smoking use included Cigarettes. He has a 60 pack-year smoking history. He has never used smokeless tobacco.  REC Glad you are better  Copd disease state is stable  Continue current medication regimen  Continue PT/rehab  Do alpha 1 genetic test for copd today 02/27/2012 - >LIKELY MS with a level 149mg % Return in 3 months with pft test at followup to see severity of disease   OV 06/25/2012  Followup for COPD. In terms of COPD he is doing well. Symptoms are stable. COPD cat score is 28 and improved from 32 and January 2014. His weight is also improved. Spirometry 06/25/12"  fev1 Pos BD fev1 1.17L/42% (+5% bd response), FVC 3.4L/88%, R 34, DLCO 36% and is consistent with Gold stage III COPD. He is on Symbicort not on Spiriva and is using nebulizers randomly   His main issues that he appears without any motivation. I cannot figure it is depressed anxious. He spent most of the time in the office complaining about social isolation and the feeling that he has been disowned by his kids. I checked the HADS depression and anxiety scale and he scored indeterminate for both the overall he admits to some amount of poor mood  Past, Family, Social reviewed: no change since last visit    CAT COPD Symptom & Quality of Life Score (GSK  trademark) 0 is no burden. 5 is highest burden 02/27/2012  06/25/2012   Never Cough -> Cough all the time 2 1  No phlegm in chest -> Chest is full of phlegm 3 3  No chest tightness -> Chest feels very tight 4 3  No dyspnea for 1 flight stairs/hill -> Very dyspneic for 1 flight of stairs 5 5  No limitations for ADL at home -> Very limited with ADL at home 5 5  Confident leaving home -> Not at all confident leaving home 5 3  Sleep soundly -> Do not sleep soundly because of lung condition 4 4  Lots of Energy -> No energy at all 4 4  TOTAL Score (max 40)  32 28  BMI  Body mass index is 17.64 kg/(m^2).   weight 113$ Filed Weights   06/25/12 1617  Weight: 116 lb (52.617 kg)       08/02/12 Follow up and med review  Returns for follow up and med review  We reviewed all his meds and organized them into a med calendar with pt education  Last ov started on Spiriva and Zoloft.  Has seen some improvement w/ anxiety  Breathing has good and bad days.  Encouraged on smoking cessation  No chest pain, edema, orthopnea or hemotpysis.  Wt is steady , no n/v.   Review of Systems  Constitutional: Negative for fever and unexpected  weight change.  HENT: Positive for trouble swallowing. Negative for ear pain, nosebleeds, congestion, sore throat, rhinorrhea, sneezing, dental problem, postnasal drip and sinus pressure.   Eyes: Negative for redness and itching.  Respiratory: Positive for shortness of breath. Negative for cough, chest tightness and wheezing.   Cardiovascular: Negative for palpitations and leg swelling.  Gastrointestinal: Negative for nausea and vomiting.  Genitourinary: Negative for dysuria.  Musculoskeletal: Negative for joint swelling.  Skin: Negative for rash.  Neurological: Negative for headaches.  Hematological: Does not bruise/bleed easily.  Psychiatric/Behavioral: Negative for dysphoric mood. The patient is not nervous/anxious.    Current outpatient prescriptions:albuterol  (PROVENTIL) (5 MG/ML) 0.5% nebulizer solution, Take 2.5 mg by nebulization 3 (three) times daily. , Disp: , Rfl: ;  ALPRAZolam (XANAX) 1 MG tablet, Take 1 mg by mouth at bedtime as needed., Disp: , Rfl: ;  ipratropium (ATROVENT) 0.02 % nebulizer solution, Take 0.5 mg by nebulization 4 (four) times daily., Disp: , Rfl:  oxycodone (ROXICODONE) 30 MG immediate release tablet, Take 1 tablet (30 mg total) by mouth every 3 (three) hours as needed. MUST LAST 28 DAYS, Disp: 200 tablet, Rfl: 0;  predniSONE (DELTASONE) 10 MG tablet, Take 20 mg by mouth daily., Disp: , Rfl: ;  rosuvastatin (CRESTOR) 20 MG tablet, Take 20 mg by mouth every morning., Disp: , Rfl:      Objective:   Physical Exam Nursing note and vitals reviewed. Constitutional: He is oriented to person, place, and time. He appears well-developed and well-nourished. No distress.    Body mass index is 17.64 kg/(m^2).  Deconditioned looking male but looking much better   HENT:  Head: Normocephalic and atraumatic.  Right Ear: External ear normal.  Left Ear: External ear normal.  Mouth/Throat: Oropharynx is clear and moist. No oropharyngeal exudate.  Eyes: Conjunctivae normal and EOM are normal. Pupils are equal, round, and reactive to light. Right eye exhibits no discharge. Left eye exhibits no discharge. No scleral icterus.  Neck: Normal range of motion. Neck supple. No JVD present. No tracheal deviation present. No thyromegaly present.  Cardiovascular: Normal rate, regular rhythm and intact distal pulses.  Exam reveals no gallop and no friction rub.   No murmur heard. Pulmonary/Chest: Effort normal and breath sounds normal. No respiratory distress. He has no wheezes. He has no rales. He exhibits no tenderness.       barrell chest  Abdominal: Soft. Bowel sounds are normal. He exhibits no distension and no mass. There is no tenderness. There is no rebound and no guarding.  Musculoskeletal: Normal range of motion. He exhibits no edema and no  tenderness.       Rt LE issues  Lymphadenopathy:    He has no cervical adenopathy.  Neurological: He is alert and oriented to person, place, and time. He has normal reflexes. No cranial nerve deficit. Coordination normal.       Has walker  Skin: Skin is warm and dry. No rash noted. He is not diaphoretic. No erythema. No pallor.  Psychiatric: He has a normal mood and affect. His behavior is normal. Judgment and thought content normal.       Positive mood. Depressive affect resolved           Assessment & Plan:

## 2012-08-07 NOTE — Assessment & Plan Note (Addendum)
Compensated on present regimen Smoking cessation discussed  Patient's medications were reviewed today and patient education was given. Computerized medication calendar was adjusted/completed  Plan  Continue on current regimen  Follow med calendar closely and bring to each visit.  follow up Dr. Marchelle Gearing in 6 -8 weeks and As needed

## 2012-08-13 ENCOUNTER — Other Ambulatory Visit: Payer: Self-pay

## 2012-08-13 MED ORDER — OXYCODONE HCL 30 MG PO TABS: 30.0000 mg | ORAL_TABLET | ORAL | Status: DC | PRN

## 2012-08-13 NOTE — Telephone Encounter (Signed)
Patient called, left message.  He is requesting a refill on Oxycodone.  (Today is the 28th day)  Call back number [phone] or [phone].

## 2012-08-23 NOTE — Addendum Note (Signed)
Addended by: Boone Master E on: 08/23/2012 04:58 PM   Modules accepted: Orders

## 2012-08-29 ENCOUNTER — Telehealth: Payer: Self-pay | Admitting: Internal Medicine

## 2012-08-29 MED ORDER — TIOTROPIUM BROMIDE MONOHYDRATE 18 MCG IN CAPS: 18.0000 ug | ORAL_CAPSULE | Freq: Every day | RESPIRATORY_TRACT | Status: DC

## 2012-08-29 NOTE — Telephone Encounter (Signed)
Samples at front, pt is aware. [name], CMA  

## 2012-09-04 ENCOUNTER — Other Ambulatory Visit: Payer: Self-pay | Admitting: Neurology

## 2012-09-10 ENCOUNTER — Other Ambulatory Visit: Payer: Self-pay

## 2012-09-10 MED ORDER — OXYCODONE HCL 30 MG PO TABS: 30.0000 mg | ORAL_TABLET | ORAL | Status: DC | PRN

## 2012-09-10 NOTE — Telephone Encounter (Signed)
Patient called requesting a refill on Oxycodone.  He would like to pick it up today if possible.

## 2012-09-13 ENCOUNTER — Encounter: Payer: Self-pay | Admitting: Internal Medicine

## 2012-09-13 ENCOUNTER — Ambulatory Visit (INDEPENDENT_AMBULATORY_CARE_PROVIDER_SITE_OTHER): Payer: Medicare Other | Admitting: Internal Medicine

## 2012-09-13 VITALS — BP 130/70 | HR 69 | Temp 97.7°F | Ht 68.0 in | Wt 119.8 lb

## 2012-09-13 DIAGNOSIS — F329 Major depressive disorder, single episode, unspecified: Secondary | ICD-10-CM

## 2012-09-13 DIAGNOSIS — J449 Chronic obstructive pulmonary disease, unspecified: Secondary | ICD-10-CM

## 2012-09-13 NOTE — Progress Notes (Signed)
Subjective:    Patient ID: [name], male    DOB: 1934-12-04, 78 y.o.   MRN: [id number]  HPI Followup copd - midst of depression, Gi issues (pancreatic mass ?), anxiety, smoking, ortho surgery, deconditioning, social isolation  Now better  - weight, mood, conditioning. Now in SNF rehab. COPD is stable. Will be dc'ed home in few days with home PT. He has not active resp complaints that are worse. Overall he is getting better. GI workup in progress; some pancreatic mass. PET scan pending per hx. He is on pureed diet. Uptodate with flu and pneumovax. He is off smoking since going to [employer]. COPD CAT score is 32    reports that he quit smoking about 3 weeks ago. His smoking use included Cigarettes. He has a 60 pack-year smoking history. He has never used smokeless tobacco.  REC Glad you are better  Copd disease state is stable  Continue current medication regimen  Continue PT/rehab  Do alpha 1 genetic test for copd today 02/27/2012 - >LIKELY MS with a level 149mg % Return in 3 months with pft test at followup to see severity of disease   OV 06/25/2012  Followup for COPD. In terms of COPD he is doing well. Symptoms are stable. COPD cat score is 28 and improved from 32 and January 2014. His weight is also improved. Spirometry 06/25/12"  fev1 Pos BD fev1 1.17L/42% (+5% bd response), FVC 3.4L/88%, R 34, DLCO 36% and is consistent with Gold stage III COPD. He is on Symbicort not on Spiriva and is using nebulizers randomly   His main issues that he appears without any motivation. I cannot figure it is depressed anxious. He spent most of the time in the office complaining about social isolation and the feeling that he has been disowned by his kids. I checked the HADS depression and anxiety scale and he scored indeterminate for both the overall he admits to some amount of poor mood  Past, Family, Social reviewed: no change since last visit  REC #DEpression/Anxiety  - start zoloft 50mg  daily   - return in 4 weeks to see NP and depending on course will increase dose  #COPD  - severe copd  - contnue advair - start spiriva  1 puff daily ; learn technique  = use nebulizer only if needed  #Followup 1 month fu with NP to do med calendar and review depression  OV 09/13/2012  Fu copd and depression  - He did med calendar with NP. OVerall COPD stab;e CAT score 27. Symptoms detailed below. He wants to feel better but unable to do rehab due to gait issues. WE discused new mdi like breo or tudorza but $ are an issue. So does not want to try. He has gained weight  - depression: he is not sure he is better but seems to be smiling more    CAT COPD Symptom & Quality of Life Score (GSK trademark) 0 is no burden. 5 is highest burden 02/27/2012  06/25/2012  09/13/2012   Never Cough -> Cough all the time 2 1 2   No phlegm in chest -> Chest is full of phlegm 3 3 3   No chest tightness -> Chest feels very tight 4 3 2   No dyspnea for 1 flight stairs/hill -> Very dyspneic for 1 flight of stairs 5 5 4   No limitations for ADL at home -> Very limited with ADL at home 5 5 5   Confident leaving home -> Not at all confident leaving  home 5 3 4   Sleep soundly -> Do not sleep soundly because of lung condition 4 4 4   Lots of Energy -> No energy at all 4 4 3   TOTAL Score (max 40)  32 28 27  BMI  Body mass index is 17.64 kg/(m^2).  Body mass index is 18.22 kg/(m^2).   weight 113$ Filed Weights   06/25/12 1617  Weight: 116 lb (52.617 kg)    Filed Weights   09/13/12 1703  Weight: 119 lb 12.8 oz (54.341 kg)         Review of Systems  Constitutional: Negative for fever and unexpected weight change.  HENT: Positive for trouble swallowing. Negative for ear pain, nosebleeds, congestion, sore throat, rhinorrhea, sneezing, dental problem, postnasal drip and sinus pressure.   Eyes: Negative for redness and itching.  Respiratory: Positive for shortness of breath. Negative for cough, chest tightness and  wheezing.   Cardiovascular: Negative for palpitations and leg swelling.  Gastrointestinal: Negative for nausea and vomiting.  Genitourinary: Negative for dysuria.  Musculoskeletal: Negative for joint swelling.  Skin: Negative for rash.  Neurological: Negative for headaches.  Hematological: Does not bruise/bleed easily.  Psychiatric/Behavioral: Negative for dysphoric mood. The patient is not nervous/anxious.    Current outpatient prescriptions:albuterol (PROVENTIL) (2.5 MG/3ML) 0.083% nebulizer solution, Take 2.5 mg by nebulization every 4 (four) hours as needed for wheezing or shortness of breath., Disp: , Rfl: ;  ALPRAZolam (XANAX) 1 MG tablet, Take 1 mg by mouth at bedtime. , Disp: , Rfl: ;  diazepam (VALIUM) 5 MG tablet, Take 5 mg by mouth every 8 (eight) hours as needed for anxiety., Disp: , Rfl:  Fluticasone-Salmeterol (ADVAIR) 250-50 MCG/DOSE AEPB, Inhale 1 puff into the lungs every 12 (twelve) hours., Disp: , Rfl: ;  hydrOXYzine (ATARAX/VISTARIL) 25 MG tablet, Take 25 mg by mouth 2 (two) times daily as needed for anxiety., Disp: , Rfl: ;  oxycodone (ROXICODONE) 30 MG immediate release tablet, Take 1 tablet (30 mg total) by mouth every 3 (three) hours as needed. MUST LAST 28 DAYS, Disp: 200 tablet, Rfl: 0 predniSONE (DELTASONE) 5 MG tablet, TAKE 2 TABLETS BY MOUTH ONCE DAILY, Disp: 60 tablet, Rfl: 3;  rosuvastatin (CRESTOR) 20 MG tablet, Take 20 mg by mouth at bedtime. , Disp: , Rfl: ;  sertraline (ZOLOFT) 50 MG tablet, Take 1 tablet (50 mg total) by mouth daily., Disp: 30 tablet, Rfl: 0;  tiotropium (SPIRIVA) 18 MCG inhalation capsule, Place 1 capsule (18 mcg total) into inhaler and inhale daily., Disp: 30 capsule, Rfl: 0      Objective:   Physical Exam Nursing note and vitals reviewed. Constitutional: He is oriented to person, place, and time. He appears well-developed and well-nourished. No distress.  Body mass index is 18.22 kg/(m^2).    HENT:  Head: Normocephalic and atraumatic.   Right Ear: External ear normal.  Left Ear: External ear normal.  Mouth/Throat: Oropharynx is clear and moist. No oropharyngeal exudate.  Eyes: Conjunctivae normal and EOM are normal. Pupils are equal, round, and reactive to light. Right eye exhibits no discharge. Left eye exhibits no discharge. No scleral icterus.  Neck: Normal range of motion. Neck supple. No JVD present. No tracheal deviation present. No thyromegaly present.  Cardiovascular: Normal rate, regular rhythm and intact distal pulses.  Exam reveals no gallop and no friction rub.   No murmur heard. Pulmonary/Chest: Effort normal and breath sounds normal. No respiratory distress. He has no wheezes. He has no rales. He exhibits no tenderness.  barrell chest  Abdominal: Soft. Bowel sounds are normal. He exhibits no distension and no mass. There is no tenderness. There is no rebound and no guarding.  Musculoskeletal: Normal range of motion. He exhibits no edema and no tenderness.       Rt LE issues  Lymphadenopathy:    He has no cervical adenopathy.  Neurological: He is alert and oriented to person, place, and time. He has normal reflexes. No cranial nerve deficit. Coordination normal.       Has walker  Skin: Skin is warm and dry. No rash noted. He is not diaphoretic. No erythema. No pallor.  Psychiatric: He has a normal mood and affect. His behavior is normal. Judgment and thought content normal.       Positive mood. Depressive affect resolved          Assessment & Plan:

## 2012-09-13 NOTE — Patient Instructions (Addendum)
#  DEpression/Anxiety - continue zoloft at current dose  - at fu wil see need to increase zoloft   #COPD  - severe copd; stable.  - If you need to optimize symptoms more it would mean more expensive inhalers  - So for now,   - contnue advair  - continue spiriva  1 puff daily ;   - use nebulizer only if needed - FLu shot in fall  #Followup  6 months or sooner if needed Call or come sooner if any health problems

## 2012-09-19 NOTE — Assessment & Plan Note (Signed)
#  COPD  - severe copd; stable.  - If you need to optimize symptoms more it would mean more expensive inhalers  - So for now,   - contnue advair  - continue spiriva  1 puff daily ;   - use nebulizer only if needed - FLu shot in fall

## 2012-09-19 NOTE — Assessment & Plan Note (Signed)
DEpression/Anxiety - continue zoloft at current dose  - at fu wil see need to increase zoloft    #Followup  6 months or sooner if needed Call or come sooner if any health problems

## 2012-10-05 ENCOUNTER — Other Ambulatory Visit: Payer: Self-pay

## 2012-10-05 MED ORDER — OXYCODONE HCL 30 MG PO TABS: 30.0000 mg | ORAL_TABLET | ORAL | Status: DC | PRN

## 2012-10-05 NOTE — Telephone Encounter (Signed)
Patient called requesting a refill on Oxycodone.  He would like to pick up the Rx on Monday.  Call back number [phone].

## 2012-10-16 ENCOUNTER — Telehealth: Payer: Self-pay | Admitting: Internal Medicine

## 2012-10-16 MED ORDER — TIOTROPIUM BROMIDE MONOHYDRATE 18 MCG IN CAPS: 18.0000 ug | ORAL_CAPSULE | Freq: Every day | RESPIRATORY_TRACT | Status: DC

## 2012-10-16 NOTE — Telephone Encounter (Signed)
Samples are up front for pick up. Pt is aware. 

## 2012-10-24 ENCOUNTER — Encounter: Payer: Self-pay | Admitting: Neurology

## 2012-10-29 ENCOUNTER — Other Ambulatory Visit: Payer: Self-pay

## 2012-10-29 ENCOUNTER — Other Ambulatory Visit: Payer: Self-pay | Admitting: Neurology

## 2012-10-29 MED ORDER — DIAZEPAM 5 MG PO TABS: ORAL_TABLET | ORAL | Status: DC

## 2012-10-29 NOTE — Telephone Encounter (Signed)
Patient called requesting a refill on Diazepam.  He would like the Rx sent To [facility].  Dr Anne Hahn is out of the office, forwarding request to Dr Marjory Lies [facility]

## 2012-10-30 NOTE — Telephone Encounter (Signed)
Rx signed and faxed.

## 2012-11-06 ENCOUNTER — Encounter: Payer: Self-pay | Admitting: Neurology

## 2012-11-06 ENCOUNTER — Ambulatory Visit (INDEPENDENT_AMBULATORY_CARE_PROVIDER_SITE_OTHER): Payer: Medicare Other | Admitting: Neurology

## 2012-11-06 VITALS — BP 149/68 | HR 67 | Wt 121.0 lb

## 2012-11-06 DIAGNOSIS — G63 Polyneuropathy in diseases classified elsewhere: Secondary | ICD-10-CM | POA: Insufficient documentation

## 2012-11-06 DIAGNOSIS — M47817 Spondylosis without myelopathy or radiculopathy, lumbosacral region: Secondary | ICD-10-CM

## 2012-11-06 HISTORY — DX: Polyneuropathy in diseases classified elsewhere: G63

## 2012-11-06 HISTORY — DX: Spondylosis without myelopathy or radiculopathy, lumbosacral region: M47.817

## 2012-11-06 MED ORDER — OXYCODONE HCL 30 MG PO TABS: 30.0000 mg | ORAL_TABLET | ORAL | Status: DC | PRN

## 2012-11-06 NOTE — Progress Notes (Signed)
Reason for visit: Chronic neck and low back pain  [name] is an 78 y.o. male  History of present illness:  Mr. [name] is a 78 year old right-handed white male with a history of a chronic pain syndrome. The patient has had prior cervical spine surgery, and chronic neck pain. The patient has peripheral vascular disease and a peripheral neuropathy, and chronic low back pain. The patient has been on daily narcotic dosing without full benefit of pain. The patient continues to indicate that his pain is unbearable. The patient is not sleeping well, and he continues to have issues with imbalance. The patient has occasional falls, even while using his walker. The patient returns to this office for an evaluation. The patient has been able to maintain his weight since last seen.   Past Medical History  Diagnosis Date  . Hyperlipidemia   . History of colon polyps   . Spinal stenosis   . Anxiety   . BPH (benign prostatic hypertrophy)   . ED (erectile dysfunction)   . Osteoporosis   . Hypertension   . Chest pain     "I've had it" (01/31/2012)   . Peripheral vascular disease     "right leg is 100% blocked; left leg has stent, still ~ 75% blocked" (01/31/2012)  . Peripheral neuropathy     "bad" (01/31/2012)  . COPD (chronic obstructive pulmonary disease)   . Emphysema   . Pneumonia 1960's    "double" (01/31/2012)  . BRBPR (bright red blood per rectum)     "don't know what it's from; had some this week" (01/31/2012)  . Cervical spondylosis   . Rheumatoid arthritis(714.0)   . Arthritis     "hands and feet" (01/31/2012)  . DDD (degenerative disc disease), lumbosacral     "S1; L5" (01/31/2012)  . Chronic lower back pain   . Depression     "imagine I've got a mild depression w/what I've gone thru last month" (01/31/2012)  . Prostate cancer 2004    "had 40 treatments of radiation" (01/31/2012)  . Breast cancer in male     "left" (01/31/2012)  . Basal cell carcinoma of nasal tip     "   . Pernicious anemia   . GERD (gastroesophageal reflux disease)   . Polyneuropathy in other diseases classified elsewhere 11/06/2012  . Lumbosacral spondylosis without myelopathy 11/06/2012    Past Surgical History  Procedure Laterality Date  . Basal cell carcinoma excision      Nose x 3  . Cholecystectomy  ?1990's  . Wrist fusion      Right Wrist; "3 OR's; it's fused" (01/31/2012)  . Excisional hemorrhoidectomy  1960's  . Knee surgery      "right; X 6 surgeries; went in for simple cartilage OR; ended up w/fused knee" (01/31/2012)  . Iliac artery stent  12-07-09    Stent done by Dr. Allyson Sabal  . Total hip arthroplasty  08/16/2011    Procedure: TOTAL HIP ARTHROPLASTY ANTERIOR APPROACH;  Surgeon: Kathryne Hitch, MD;  Location: [facility] OR;  Service: Orthopedics;  Laterality: Right;  Right total hip replacement  . Cervical disc surgery      "7 total; ended w/a fusion" (01/31/2012)  . Esophagogastroduodenoscopy  02/22/2012    Procedure: ESOPHAGOGASTRODUODENOSCOPY (EGD);  Surgeon: Adien Kimmel Modena, MD;  Location: [facility];  Service: Endoscopy;  Laterality: N/A;  . Eus  02/22/2012    Procedure: UPPER ENDOSCOPIC ULTRASOUND (EUS) RADIAL;  Surgeon: Bartow Zylstra Modena, MD;  Location: [facility] ENDOSCOPY;  Service: Endoscopy;  Laterality: N/A;  . Hernia repair    . Transurethral resection of prostate      Family History  Problem Relation Age of Onset  . Hypertension Mother   . Cancer Mother     colon cancer  . Stroke Mother   . Aneurysm Father     abdominal aortic  . Heart disease Brother     Social history:  reports that he quit smoking about 9 months ago. His smoking use included Cigarettes. He has a 12 pack-year smoking history. He has never used smokeless tobacco. He reports that he does not drink alcohol or use illicit drugs.    Allergies  Allergen Reactions  . Lipitor [Atorvastatin Calcium] Other (See Comments)    Myalgias "don't remember how bad" (01/31/2012)  . Penicillins   . Tiotropium  Bromide Monohydrate Other (See Comments)    hoarseness    Medications:  Current Outpatient Prescriptions on File Prior to Visit  Medication Sig Dispense Refill  . albuterol (PROVENTIL) (2.5 MG/3ML) 0.083% nebulizer solution Take 2.5 mg by nebulization every 4 (four) hours as needed for wheezing or shortness of breath.      . ALPRAZolam (XANAX) 1 MG tablet Take 1 mg by mouth at bedtime.       . diazepam (VALIUM) 5 MG tablet One tablet po twice during the day and two tabs po at night  120 tablet  0  . Fluticasone-Salmeterol (ADVAIR) 250-50 MCG/DOSE AEPB Inhale 1 puff into the lungs every 12 (twelve) hours.      . hydrOXYzine (ATARAX/VISTARIL) 25 MG tablet Take 25 mg by mouth 2 (two) times daily as needed for anxiety.      . predniSONE (DELTASONE) 5 MG tablet TAKE 2 TABLETS BY MOUTH ONCE DAILY  60 tablet  3  . rosuvastatin (CRESTOR) 20 MG tablet Take 20 mg by mouth at bedtime.       . sertraline (ZOLOFT) 50 MG tablet Take 1 tablet (50 mg total) by mouth daily.  30 tablet  0  . tiotropium (SPIRIVA) 18 MCG inhalation capsule Place 1 capsule (18 mcg total) into inhaler and inhale daily.  60 capsule  0   No current facility-administered medications on file prior to visit.    ROS:  Out of a complete 14 system review of symptoms, the patient complains only of the following symptoms, and all other reviewed systems are negative.  Weight loss, fatigue Swelling in the legs Difficulty swallowing Blurring of vision Shortness of breath Urination problems Easy bruising Feeling cold Joint pain, joint swelling, achy muscles Weakness Depression, insomnia, change in appetite, restless legs  Blood pressure 149/68, pulse 67, weight 121 lb (54.885 kg).  Physical Exam  General: The patient is alert and cooperative at the time of the examination.  Neuromuscular: The right leg is fused at the knee.  Skin: No significant peripheral edema is noted.   Neurologic Exam  Cranial nerves: Facial  symmetry is present. Speech is normal, no aphasia or dysarthria is noted. Extraocular movements are full. Visual fields are full.  Motor: The patient has good strength in all 4 extremities.  Coordination: The patient has good finger-nose-finger and heel-to-shin bilaterally, with the exception that he cannot perform heel-to-shin with the right leg secondary to the fusion at the knee.  Gait and station: The patient walks with a walker. The patient has fairly good stability while walking, gait is slightly wide-based.  Reflexes: Deep tendon reflexes are symmetric, but are depressed.   Assessment/Plan:  1. Chronic cervical and  lumbosacral spine discomfort  2. Peripheral neuropathy  3. Gait disorder  4. Peripheral vascular disease  The patient has ongoing pain issues. The patient will be sent for an epidural steroid injection of the lumbosacral spine to see if this helps his back and leg pain. The patient wants to go up on his narcotic medications, but he is already on fairly maximal doses. The patient will followup through this office in 6 months. A prescription was given for his oxycodone today.  Marlan Palau MD 11/06/2012 7:05 PM  [facility] [address] [place], [place] [address]  Phone [phone] Fax [phone]

## 2012-11-07 ENCOUNTER — Other Ambulatory Visit: Payer: Self-pay | Admitting: Neurology

## 2012-11-07 DIAGNOSIS — G8929 Other chronic pain: Secondary | ICD-10-CM

## 2012-11-15 ENCOUNTER — Emergency Department (HOSPITAL_COMMUNITY)
Admission: EM | Admit: 2012-11-15 | Discharge: 2012-11-15 | Disposition: A | Discharge status: Home / Self Care | Payer: Medicare Other | Attending: Emergency Medicine | Admitting: Emergency Medicine

## 2012-11-15 ENCOUNTER — Emergency Department (HOSPITAL_COMMUNITY): Payer: Medicare Other

## 2012-11-15 ENCOUNTER — Encounter (HOSPITAL_COMMUNITY): Payer: Self-pay | Admitting: Emergency Medicine

## 2012-11-15 DIAGNOSIS — S8990XA Unspecified injury of unspecified lower leg, initial encounter: Secondary | ICD-10-CM | POA: Insufficient documentation

## 2012-11-15 DIAGNOSIS — Z860100 Personal history of colon polyps, unspecified: Secondary | ICD-10-CM | POA: Insufficient documentation

## 2012-11-15 DIAGNOSIS — Z853 Personal history of malignant neoplasm of breast: Secondary | ICD-10-CM | POA: Insufficient documentation

## 2012-11-15 DIAGNOSIS — T148XXA Other injury of unspecified body region, initial encounter: Secondary | ICD-10-CM

## 2012-11-15 DIAGNOSIS — R296 Repeated falls: Secondary | ICD-10-CM | POA: Insufficient documentation

## 2012-11-15 DIAGNOSIS — S0180XA Unspecified open wound of other part of head, initial encounter: Secondary | ICD-10-CM | POA: Insufficient documentation

## 2012-11-15 DIAGNOSIS — J4489 Other specified chronic obstructive pulmonary disease: Secondary | ICD-10-CM | POA: Insufficient documentation

## 2012-11-15 DIAGNOSIS — F411 Generalized anxiety disorder: Secondary | ICD-10-CM | POA: Insufficient documentation

## 2012-11-15 DIAGNOSIS — Z8719 Personal history of other diseases of the digestive system: Secondary | ICD-10-CM | POA: Insufficient documentation

## 2012-11-15 DIAGNOSIS — Z87891 Personal history of nicotine dependence: Secondary | ICD-10-CM | POA: Insufficient documentation

## 2012-11-15 DIAGNOSIS — Y9301 Activity, walking, marching and hiking: Secondary | ICD-10-CM | POA: Insufficient documentation

## 2012-11-15 DIAGNOSIS — E785 Hyperlipidemia, unspecified: Secondary | ICD-10-CM | POA: Insufficient documentation

## 2012-11-15 DIAGNOSIS — IMO0002 Reserved for concepts with insufficient information to code with codable children: Secondary | ICD-10-CM

## 2012-11-15 DIAGNOSIS — Z8546 Personal history of malignant neoplasm of prostate: Secondary | ICD-10-CM | POA: Insufficient documentation

## 2012-11-15 DIAGNOSIS — S79919A Unspecified injury of unspecified hip, initial encounter: Secondary | ICD-10-CM | POA: Insufficient documentation

## 2012-11-15 DIAGNOSIS — Z87448 Personal history of other diseases of urinary system: Secondary | ICD-10-CM | POA: Insufficient documentation

## 2012-11-15 DIAGNOSIS — J449 Chronic obstructive pulmonary disease, unspecified: Secondary | ICD-10-CM | POA: Insufficient documentation

## 2012-11-15 DIAGNOSIS — F3289 Other specified depressive episodes: Secondary | ICD-10-CM | POA: Insufficient documentation

## 2012-11-15 DIAGNOSIS — Z8701 Personal history of pneumonia (recurrent): Secondary | ICD-10-CM | POA: Insufficient documentation

## 2012-11-15 DIAGNOSIS — I10 Essential (primary) hypertension: Secondary | ICD-10-CM | POA: Insufficient documentation

## 2012-11-15 DIAGNOSIS — Z79899 Other long term (current) drug therapy: Secondary | ICD-10-CM | POA: Insufficient documentation

## 2012-11-15 DIAGNOSIS — T07XXXA Unspecified multiple injuries, initial encounter: Secondary | ICD-10-CM

## 2012-11-15 DIAGNOSIS — F329 Major depressive disorder, single episode, unspecified: Secondary | ICD-10-CM | POA: Insufficient documentation

## 2012-11-15 DIAGNOSIS — Z8669 Personal history of other diseases of the nervous system and sense organs: Secondary | ICD-10-CM | POA: Insufficient documentation

## 2012-11-15 DIAGNOSIS — Z880 Allergy status to penicillin: Secondary | ICD-10-CM | POA: Insufficient documentation

## 2012-11-15 DIAGNOSIS — Z8739 Personal history of other diseases of the musculoskeletal system and connective tissue: Secondary | ICD-10-CM | POA: Insufficient documentation

## 2012-11-15 DIAGNOSIS — S20219A Contusion of unspecified front wall of thorax, initial encounter: Secondary | ICD-10-CM | POA: Insufficient documentation

## 2012-11-15 DIAGNOSIS — S0993XA Unspecified injury of face, initial encounter: Secondary | ICD-10-CM | POA: Insufficient documentation

## 2012-11-15 DIAGNOSIS — Y92009 Unspecified place in unspecified non-institutional (private) residence as the place of occurrence of the external cause: Secondary | ICD-10-CM | POA: Insufficient documentation

## 2012-11-15 DIAGNOSIS — Z862 Personal history of diseases of the blood and blood-forming organs and certain disorders involving the immune mechanism: Secondary | ICD-10-CM | POA: Insufficient documentation

## 2012-11-15 MED ORDER — SILVER SULFADIAZINE 1 % EX CREA: TOPICAL_CREAM | Freq: Every day | CUTANEOUS | Status: DC

## 2012-11-15 MED ORDER — HYDROMORPHONE HCL PF 2 MG/ML IJ SOLN
2.0000 mg | Freq: Once | INTRAMUSCULAR | Status: AC
  Administered 2012-11-15: 2 mg via INTRAMUSCULAR
  Filled 2012-11-15: qty 1

## 2012-11-15 NOTE — ED Provider Notes (Signed)
CSN: [id number]     Arrival date & time 11/15/12  1916 History   First MD Initiated Contact with Patient 11/15/12 1919     Chief Complaint  Patient presents with  . Fall  . Chin laceration    (Consider location/radiation/quality/duration/timing/severity/associated sxs/prior Treatment) HPI Comments: Patient presents with laceration after sustaining a fall. He states he was walking into his house and he reached the door handle which he missed any fell forward hitting his chin on a table inside the house. He denies any loss of consciousness. He has a laceration to his chin which has been bleeding since the incident. He has some tenderness in his neck and his lower back. He states his low back pain is chronic and unchanged from his baseline. He also has some pain to his left foot and his right hip. He's had a right hip replacement. He states his tetanus shot is up-to-date. He denies any nausea vomiting.  Patient is a 78 y.o. male presenting with fall.  Fall Pertinent negatives include no chest pain, no abdominal pain, no headaches and no shortness of breath.    Past Medical History  Diagnosis Date  . Hyperlipidemia   . History of colon polyps   . Spinal stenosis   . Anxiety   . BPH (benign prostatic hypertrophy)   . ED (erectile dysfunction)   . Osteoporosis   . Hypertension   . Chest pain     "I've had it" (01/31/2012)   . Peripheral vascular disease     "right leg is 100% blocked; left leg has stent, still ~ 75% blocked" (01/31/2012)  . Peripheral neuropathy     "bad" (01/31/2012)  . COPD (chronic obstructive pulmonary disease)   . Emphysema   . Pneumonia 1960's    "double" (01/31/2012)  . BRBPR (bright red blood per rectum)     "don't know what it's from; had some this week" (01/31/2012)  . Cervical spondylosis   . Rheumatoid arthritis(714.0)   . Arthritis     "hands and feet" (01/31/2012)  . DDD (degenerative disc disease), lumbosacral     "S1; L5" (01/31/2012)  . Chronic  lower back pain   . Depression     "imagine I've got a mild depression w/what I've gone thru last month" (01/31/2012)  . Prostate cancer 2004    "had 40 treatments of radiation" (01/31/2012)  . Breast cancer in male     "left" (01/31/2012)  . Basal cell carcinoma of nasal tip     "  . Pernicious anemia   . GERD (gastroesophageal reflux disease)   . Polyneuropathy in other diseases classified elsewhere 11/06/2012  . Lumbosacral spondylosis without myelopathy 11/06/2012   Past Surgical History  Procedure Laterality Date  . Basal cell carcinoma excision      Nose x 3  . Cholecystectomy  ?1990's  . Wrist fusion      Right Wrist; "3 OR's; it's fused" (01/31/2012)  . Excisional hemorrhoidectomy  1960's  . Knee surgery      "right; X 6 surgeries; went in for simple cartilage OR; ended up w/fused knee" (01/31/2012)  . Iliac artery stent  12-07-09    Stent done by Dr. Allyson Sabal  . Total hip arthroplasty  08/16/2011    Procedure: TOTAL HIP ARTHROPLASTY ANTERIOR APPROACH;  Surgeon: Kathryne Hitch, MD;  Location: [facility] OR;  Service: Orthopedics;  Laterality: Right;  Right total hip replacement  . Cervical disc surgery      "7 total; ended w/a fusion" (  01/31/2012)  . Esophagogastroduodenoscopy  02/22/2012    Procedure: ESOPHAGOGASTRODUODENOSCOPY (EGD);  Surgeon: Willis Modena, MD;  Location: [facility];  Service: Endoscopy;  Laterality: N/A;  . Eus  02/22/2012    Procedure: UPPER ENDOSCOPIC ULTRASOUND (EUS) RADIAL;  Surgeon: Willis Modena, MD;  Location: [facility] ENDOSCOPY;  Service: Endoscopy;  Laterality: N/A;  . Hernia repair    . Transurethral resection of prostate     Family History  Problem Relation Age of Onset  . Hypertension Mother   . Cancer Mother     colon cancer  . Stroke Mother   . Aneurysm Father     abdominal aortic  . Heart disease Brother    History  Substance Use Topics  . Smoking status: Former Smoker -- 0.20 packs/day for 60 years    Types: Cigarettes    Quit date:  01/31/2012  . Smokeless tobacco: Never Used  . Alcohol Use: No     Comment: 01/31/2012 "heavy drinker 35 years ago; don't drink anymore at all now; had a beer ~ 10 yr ago"    Review of Systems  Constitutional: Negative for fever, chills, diaphoresis and fatigue.  HENT: Positive for neck pain. Negative for congestion, rhinorrhea and sneezing.   Eyes: Negative.   Respiratory: Negative for cough, chest tightness and shortness of breath.   Cardiovascular: Negative for chest pain and leg swelling.  Gastrointestinal: Negative for nausea, vomiting, abdominal pain, diarrhea and blood in stool.  Genitourinary: Negative for frequency, hematuria, flank pain and difficulty urinating.  Musculoskeletal: Positive for back pain and arthralgias.  Skin: Positive for wound. Negative for rash.  Neurological: Negative for dizziness, speech difficulty, weakness, numbness and headaches.    Allergies  Lipitor; Penicillins; and Tiotropium bromide monohydrate  Home Medications   Current Outpatient Rx  Name  Route  Sig  Dispense  Refill  . albuterol (PROVENTIL) (2.5 MG/3ML) 0.083% nebulizer solution   Nebulization   Take 2.5 mg by nebulization every 4 (four) hours as needed for wheezing or shortness of breath.         . ALPRAZolam (XANAX) 1 MG tablet   Oral   Take 1 mg by mouth at bedtime.          . diazepam (VALIUM) 5 MG tablet      One tablet po twice during the day and two tabs po at night   120 tablet   0     Pharmacy Fax [phone]   . Fluticasone-Salmeterol (ADVAIR) 250-50 MCG/DOSE AEPB   Inhalation   Inhale 1 puff into the lungs every 12 (twelve) hours.         . hydrOXYzine (ATARAX/VISTARIL) 25 MG tablet   Oral   Take 25 mg by mouth 2 (two) times daily as needed for anxiety.         Marland Kitchen oxycodone (ROXICODONE) 30 MG immediate release tablet   Oral   Take 1 tablet (30 mg total) by mouth every 3 (three) hours as needed. MUST LAST 28 DAYS   200 tablet   0   . predniSONE  (DELTASONE) 5 MG tablet      TAKE 2 TABLETS BY MOUTH ONCE DAILY   60 tablet   3   . rosuvastatin (CRESTOR) 20 MG tablet   Oral   Take 20 mg by mouth at bedtime.          . sertraline (ZOLOFT) 50 MG tablet   Oral   Take 1 tablet (50 mg total) by mouth daily.   30  tablet   0   . tiotropium (SPIRIVA) 18 MCG inhalation capsule   Inhalation   Place 1 capsule (18 mcg total) into inhaler and inhale daily.   60 capsule   0   . traZODone (DESYREL) 50 MG tablet   Oral   Take 50 mg by mouth at bedtime.         . silver sulfADIAZINE (SILVADENE) 1 % cream   Topical   Apply topically daily.   50 g   0    BP 144/74  Pulse 72  Temp(Src) 98.3 F (36.8 C) (Oral)  Resp 20  SpO2 96% Physical Exam  Constitutional: He is oriented to person, place, and time. He appears well-developed and well-nourished.  HENT:  Head: Normocephalic and atraumatic.  Patient is a 2 cm laceration to the chin. There is no underlying bony tenderness. There is no intraoral wounds.  Eyes: Pupils are equal, round, and reactive to light.  Neck:  Patient stiffness in his neck which is chronic due to cervical fusions. He has some tenderness diffusely along the cervical spine. No step-offs or deformities are noted. There is no tenderness to the thoracic or lumbosacral spine.  Cardiovascular: Normal rate, regular rhythm and normal heart sounds.   Pulmonary/Chest: Effort normal and breath sounds normal. No respiratory distress. He has no wheezes. He has no rales. He exhibits no tenderness.  There is a small area of ecchymosis to the right chest wall but there is no underlying bony tenderness.  Abdominal: Soft. Bowel sounds are normal. There is no tenderness. There is no rebound and no guarding.  Musculoskeletal: Normal range of motion. He exhibits no edema.  Patient has some mild pain in range of motion of the right hip and some pain on palpation of the left foot. There is no pain to the left ankle or knee. He has  an abrasion overlying the left knee but there is no underlying bony tenderness. There is no pain on range of motion of the left hip. There is a large skin tear to the right forearm and a small skin tear to the right hand there is no underlying bony tenderness to the right upper extremity.  Lymphadenopathy:    He has no cervical adenopathy.  Neurological: He is alert and oriented to person, place, and time.  Skin: Skin is warm and dry. No rash noted.  Psychiatric: He has a normal mood and affect.    ED Course  LACERATION REPAIR Date/Time: 11/15/2012 9:34 PM Performed by: Pancho Rushing Authorized by: Rolan Bucco Consent: Verbal consent obtained. Risks and benefits: risks, benefits and alternatives were discussed Consent given by: patient Body area: head/neck Location details: chin Laceration length: 2 cm Tendon involvement: none Nerve involvement: none Vascular damage: no Anesthesia: local infiltration Local anesthetic: lidocaine 1% with epinephrine Anesthetic total: 3 ml Patient sedated: no Preparation: Patient was prepped and draped in the usual sterile fashion. Irrigation solution: saline Irrigation method: syringe Amount of cleaning: standard Debridement: none Degree of undermining: none Skin closure: 5-0 Prolene Number of sutures: 7 Technique: simple Approximation: close Approximation difficulty: simple Patient tolerance: Patient tolerated the procedure well with no immediate complications. Comments: Patient initially had some ongoing bleeding from the wound.  Clotting powder was placed in the wound and pressure was applied.  This controlled the bleeding. The wound did not appear to go down to bone. It was not a through and through laceration.   (including critical care time) Labs Review Labs Reviewed - No data to  display Imaging Review Dg Hip Complete Right  11/15/2012   *RADIOLOGY REPORT*  Clinical Data: Fall, right hip and left foot pain  RIGHT HIP - COMPLETE 2+  VIEW  Comparison: Prior radiographs of the pelvis and right hip 08/14/2011  Findings: Surgical changes of right total hip arthroplasty without evidence of hardware complication.  No evidence of periprosthetic fracture.  The femoral head component is located.  Healed fracture the left inferior pubic ramus.  Surgical clips project over the pubic symphysis, query prior mastectomy procedure.  A metallic stent projects over the expected location of the left common iliac artery.  The left hip is unremarkable.  IMPRESSION:  1.  No acute fracture or malalignment. 2.  Right total hip arthroplasty without evidence of periprosthetic fracture or hardware complication.   Original Report Authenticated By: Malachy Moan, M.D.   Ct Cervical Spine Wo Contrast  11/15/2012   *RADIOLOGY REPORT*  Clinical Data: Fall, neck pain  CT CERVICAL SPINE WITHOUT CONTRAST  Technique:  Multidetector CT imaging of the cervical spine was performed. Multiplanar CT image reconstructions were also generated.  Comparison: None.  Findings: There are no fractures.  There is reverse lordosis. There is an anterior compression plate at W0-9 with rudimentary and degenerated disc at this level.  There is disc space fusion at C3-4 and C5-6.  C6-7 level is also fused.  The C7-T1 level shows mild to moderate disc bulge with vacuum phenomenon.  There is no prevertebral soft tissue swelling.  IMPRESSION: The C4-5 level demonstrates the presence of a compression plate. There is multilevel disc space bony fusion.  There are no fractures.   Original Report Authenticated By: Esperanza Heir, M.D.   Dg Foot Complete Left  11/15/2012   *RADIOLOGY REPORT*  Clinical Data: Fall, foot pain  LEFT FOOT - COMPLETE 3+ VIEW  Comparison: None.  Findings: Three radiographs the foot demonstrate no acute fracture, malalignment or focal soft tissue swelling.  The bones are mildly osteopenic.  No lytic or blastic osseous lesion.  Mild degenerative changes in the great toe MTP  joint.  IMPRESSION: No acute fracture or malalignment.   Original Report Authenticated By: Malachy Moan, M.D.    MDM   1. Laceration   2. Multiple contusions   3. Multiple skin tears    Patient's laceration measured.. Tetanus is up-to-date. There is no evidence of fracture. He was given a shot of Dilantin here in the ED for pain and he has chronic pain medications at home to use. He was advised to followup with his doctor in 10-14 days for suture removal or sooner for any worsening symptoms or signs of infection.    Rolan Bucco, MD 11/15/12 2136

## 2012-11-15 NOTE — ED Notes (Signed)
Per EMS: While walking into the door of his house, pt reached out for door, missed door, and fell and hit chin on table. 2 inch laceration to chin, bleeding controlled until pt starts talking. Pt also has skin tears to right arm and left knee. Hx of osteoporosis, hip replacement that didn't take, COPD. Pt has had 7 surgeries on his neck, fusion, so pt can't move neck. Pain to both knees, both ankles, right arm and chin.

## 2012-11-15 NOTE — ED Notes (Signed)
Bed: WA08 Expected date:  Expected time:  Means of arrival:  Comments: ems- 78 yo, fall, laceration to shin, bleeding controlled

## 2012-12-03 ENCOUNTER — Other Ambulatory Visit: Payer: Self-pay

## 2012-12-03 MED ORDER — OXYCODONE HCL 30 MG PO TABS: 30.0000 mg | ORAL_TABLET | ORAL | Status: DC | PRN

## 2012-12-03 NOTE — Telephone Encounter (Signed)
Patient called requesting a refill on Oxycodone.  He would like to pick up the Rx when it's ready.  Call back number [phone] or [phone].

## 2012-12-28 ENCOUNTER — Other Ambulatory Visit: Payer: Self-pay | Admitting: Diagnostic Neuroimaging

## 2012-12-28 ENCOUNTER — Telehealth: Payer: Self-pay | Admitting: Neurology

## 2012-12-28 ENCOUNTER — Other Ambulatory Visit: Payer: Self-pay | Admitting: Neurology

## 2012-12-28 MED ORDER — OXYCODONE HCL 30 MG PO TABS: 30.0000 mg | ORAL_TABLET | ORAL | Status: DC | PRN

## 2012-12-28 NOTE — Telephone Encounter (Signed)
Called patient to inform him that his prescription was ready to be picked up and patient verbalized understanding and stated that he would be picking up his prescription at the front desk.

## 2013-01-06 ENCOUNTER — Other Ambulatory Visit: Payer: Self-pay | Admitting: Neurology

## 2013-01-23 ENCOUNTER — Ambulatory Visit (INDEPENDENT_AMBULATORY_CARE_PROVIDER_SITE_OTHER): Payer: Medicare Other | Admitting: Internal Medicine

## 2013-01-23 ENCOUNTER — Ambulatory Visit (INDEPENDENT_AMBULATORY_CARE_PROVIDER_SITE_OTHER)
Admission: RE | Admit: 2013-01-23 | Discharge: 2013-01-23 | Disposition: A | Discharge status: Home / Self Care | Payer: Medicare Other | Source: Ambulatory Visit | Attending: Internal Medicine | Admitting: Internal Medicine

## 2013-01-23 ENCOUNTER — Encounter: Payer: Self-pay | Admitting: Internal Medicine

## 2013-01-23 VITALS — BP 160/90 | HR 66 | Ht 69.0 in | Wt 124.4 lb

## 2013-01-23 DIAGNOSIS — J189 Pneumonia, unspecified organism: Secondary | ICD-10-CM

## 2013-01-23 DIAGNOSIS — J449 Chronic obstructive pulmonary disease, unspecified: Secondary | ICD-10-CM

## 2013-01-23 DIAGNOSIS — J441 Chronic obstructive pulmonary disease with (acute) exacerbation: Secondary | ICD-10-CM

## 2013-01-23 DIAGNOSIS — J4489 Other specified chronic obstructive pulmonary disease: Secondary | ICD-10-CM

## 2013-01-23 MED ORDER — AZITHROMYCIN 250 MG PO TABS: ORAL_TABLET | ORAL | Status: DC

## 2013-01-23 NOTE — Patient Instructions (Addendum)
Plan A = automatic =  Advair / spiriva each  Am and advair again in evening  Plan B = backup = Only use your albuterol as a rescue medication to be used if you can't catch your breath by resting or doing a relaxed purse lip breathing pattern.  - The less you use it, the better it will work when you need it. - Ok to use up to every 4 hours if you need start needing more than twice daily increase the prednisone to 20 mg per day until you don't need it so much  - Don't leave home without it !!  (think of it like your spare tire for your car)   zpak should turn the mucus clear and if not we'll need to get you something stronger.  For cough/ congestion > mucinex dm up to 1200 mg every 12 hours   Please remember to go to the x-ray department downstairs for your tests - we will call you with the results when they are available.  Please schedule a follow up office visit in 4 weeks, sooner if needed to see Ramaswamy.

## 2013-01-23 NOTE — Progress Notes (Signed)
Subjective:    Patient ID: ISHMAIL MCMANAMON, male    DOB: 11-17-1934, 78 y.o.   MRN: [id number]  HPI Followup copd - midst of depression, Gi issues (pancreatic mass ?), anxiety, smoking, ortho surgery, deconditioning, social isolation  Now better  - weight, mood, conditioning. Now in SNF rehab. COPD is stable. Will be dc'ed home in few days with home PT. He has not active resp complaints that are worse. Overall he is getting better. GI workup in progress; some pancreatic mass. PET scan pending per hx. He is on pureed diet. Uptodate with flu and pneumovax. He is off smoking since going to [employer]. COPD CAT score is 32    reports that he quit smoking about 3 weeks ago. His smoking use included Cigarettes. He has a 60 pack-year smoking history. He has never used smokeless tobacco.  REC Glad you are better  Copd disease state is stable  Continue current medication regimen  Continue PT/rehab  Do alpha 1 genetic test for copd today 02/27/2012 - >LIKELY MS with a level 149mg % Return in 3 months with pft test at followup to see severity of disease   OV 06/25/2012  Followup for COPD. In terms of COPD he is doing well. Symptoms are stable. COPD cat score is 28 and improved from 32 and January 2014. His weight is also improved. Spirometry 06/25/12"  fev1 Pos BD fev1 1.17L/42% (+5% bd response), FVC 3.4L/88%, R 34, DLCO 36% and is consistent with Gold stage III COPD. He is on Symbicort not on Spiriva and is using nebulizers randomly   His main issues that he appears without any motivation. I cannot figure it is depressed anxious. He spent most of the time in the office complaining about social isolation and the feeling that he has been disowned by his kids. I checked the HADS depression and anxiety scale and he scored indeterminate for both the overall he admits to some amount of poor mood  Past, Family, Social reviewed: no change since last visit  REC #DEpression/Anxiety  - start zoloft 50mg  daily  - return in 4 weeks to see NP and depending on course will increase dose  #COPD  - severe copd  - contnue advair - start spiriva  1 puff daily ; learn technique  = use nebulizer only if needed  #Followup 1 month fu with NP to do med calendar and review depression   01/23/2013  Acute  ov/Teresa Lemmerman re: copd/ polypharmacy no med calenar  Chief Complaint  Patient presents with  . Acute Visit    sob MR pt, worsening x1 month. Pain across mid area when breathing  prednisone 5 mg 2 each am, mucus has turned darker, thicker, esp in am and more difficult to cough up sputum - onset of sob/ cough   insidious and minimally progressive. Confused with meds/ timing especially (uses spiriva in evening)   Cp is more of a generalized anterior discomfort, better p saba   No obvious day to day or daytime variabilty or assoc  subjective wheeze overt sinus or hb symptoms. No unusual exp hx or h/o childhood pna/ asthma or knowledge of premature birth.  Sleeping ok without nocturnal  or early am exacerbation  of respiratory  c/o's or need for noct saba. Also denies any obvious fluctuation of symptoms with weather or environmental changes or other aggravating or alleviating factors except as outlined above   Current Medications, Allergies, Complete Past Medical History, Past Surgical History, Family History, and Social History were  reviewed in [facility] electronic medical record.  ROS  The following are not active complaints unless bolded sore throat, dysphagia, dental problems, itching, sneezing,  nasal congestion or excess/ purulent secretions, ear ache,   fever, chills, sweats, unintended wt loss, pleuritic or exertional cp, hemoptysis,  orthopnea pnd or leg swelling, presyncope, palpitations, heartburn, abdominal pain, anorexia, nausea, vomiting, diarrhea  or change in bowel or urinary habits, change in stools or urine, dysuria,hematuria,  rash, arthralgias, visual complaints, headache, numbness weakness or  ataxia or problems with walking (has walker)   or coordination,  change in mood/affect or memory.                       Objective:   Physical Exam  Disheveled amb wm nad  Wt Readings from Last 3 Encounters:  01/23/13 124 lb 6.4 oz (56.427 kg)  11/06/12 121 lb (54.885 kg)  04/27/12 117 lb 8 oz (53.298 kg)     HEENT mild turbinate edema.  Oropharynx no thrush or excess pnd or cobblestoning.  No JVD or cervical adenopathy. Mild accessory muscle hypertrophy. Trachea midline, nl thryroid. Chest was hyperinflated by percussion with diminished breath sounds and moderate increased exp time without wheeze. Hoover sign positive at mid inspiration. Regular rate and rhythm without murmur gallop or rub or increase P2 or edema.  Abd: no hsm, nl excursion. Ext warm without cyanosis or clubbing.        .          cxr 01/23/13 The cardiac silhouette, mediastinal and hilar contours are within  normal limits and stable. Advanced emphysematous changes and  pulmonary scarring. Increased bibasilar density. Suspect overlying  lingular pneumonia.       Assessment & Plan:

## 2013-01-24 ENCOUNTER — Other Ambulatory Visit: Payer: Self-pay

## 2013-01-24 MED ORDER — OXYCODONE HCL 30 MG PO TABS: 30.0000 mg | ORAL_TABLET | ORAL | Status: DC | PRN

## 2013-01-24 NOTE — Progress Notes (Signed)
Quick Note:  Spoke with pt and notified of results per Dr. Wert. Pt verbalized understanding and denied any questions.  ______ 

## 2013-01-24 NOTE — Telephone Encounter (Signed)
Patient called requesting a refill on Oxycodone.  Today is the 28th day since Rx was last written.  He would like to pick it up when it's ready.  Call back number [phone].

## 2013-01-25 ENCOUNTER — Telehealth: Payer: Self-pay | Admitting: *Deleted

## 2013-01-25 DIAGNOSIS — J189 Pneumonia, unspecified organism: Secondary | ICD-10-CM | POA: Insufficient documentation

## 2013-01-25 NOTE — Telephone Encounter (Signed)
Called patient and left message that his prescription was ready to be picked up and if he has any other problems, questions or concerns to call the office. °

## 2013-01-25 NOTE — Telephone Encounter (Signed)
error 

## 2013-01-25 NOTE — Assessment & Plan Note (Signed)
Not clearly in lingula but could be RML as well > already received zpak at ov but if not doing better by 12/12 plan to rx levaquin 750 x 5 days and f/u in one week.

## 2013-01-25 NOTE — Assessment & Plan Note (Signed)
DDX of  difficult airways managment all start with A and  include Adherence, Ace Inhibitors, Acid Reflux, Active Sinus Disease, Alpha 1 Antitripsin deficiency, Anxiety masquerading as Airways dz,  ABPA,  allergy(esp in young), Aspiration (esp in elderly), Adverse effects of DPI,  Active smokers, plus two Bs  = Bronchiectasis and Beta blocker use..and one C= CHF   Adherence is always the initial "prime suspect" and is a multilayered concern that requires a "trust but verify" approach in every patient - starting with knowing how to use medications, especially inhalers, correctly, keeping up with refills and understanding the fundamental difference between maintenance and prns vs those medications only taken for a very short course and then stopped and not refilled.  - has med calendar not using - confused with timing of meds and how to use prns  See instructions for specific recommendations which were reviewed directly with the patient who was given a copy with highlighter outlining the key components.

## 2013-01-25 NOTE — Telephone Encounter (Signed)
Message copied by Christen Butter on Fri Jan 25, 2013  2:59 PM ------      Message from: Sandrea Hughs B      Created: Fri Jan 25, 2013  6:34 AM       See if better if not levaquin 750 x 5 days            Either way Needs f/u with Tammy with all meds and the calendar she gave him if he can find it end of next week for repeat cxr  ------

## 2013-01-28 ENCOUNTER — Telehealth: Payer: Self-pay | Admitting: Internal Medicine

## 2013-01-28 NOTE — Telephone Encounter (Signed)
Last OV 01/23/13 w/ MW: Patient Instructions      Plan A = automatic =  Advair / spiriva each  Am and advair again in evening  Plan B = backup = Only use your albuterol as a rescue medication to be used if you can't catch your breath by resting or doing a relaxed purse lip breathing pattern.   - The less you use it, the better it will work when you need it. - Ok to use up to every 4 hours if you need start needing more than twice daily increase the prednisone to 20 mg per day until you don't need it so much   - Don't leave home without it !!  (think of it like your spare tire for your car)  zpak should turn the mucus clear and if not we'll need to get you something stronger. For cough/ congestion > mucinex dm up to 1200 mg every 12 hours  Please remember to go to the x-ray department downstairs for your tests - we will call you with the results when they are available. Please schedule a follow up office visit in 4 weeks, sooner if needed to see Ramaswamy.    ---  lmtcb X1 FOR PT

## 2013-01-29 ENCOUNTER — Ambulatory Visit (INDEPENDENT_AMBULATORY_CARE_PROVIDER_SITE_OTHER): Payer: Medicare Other | Admitting: Internal Medicine

## 2013-01-29 ENCOUNTER — Ambulatory Visit (INDEPENDENT_AMBULATORY_CARE_PROVIDER_SITE_OTHER)
Admission: RE | Admit: 2013-01-29 | Discharge: 2013-01-29 | Disposition: A | Discharge status: Home / Self Care | Payer: Medicare Other | Source: Ambulatory Visit | Attending: Internal Medicine | Admitting: Internal Medicine

## 2013-01-29 ENCOUNTER — Encounter: Payer: Self-pay | Admitting: Internal Medicine

## 2013-01-29 ENCOUNTER — Other Ambulatory Visit: Payer: Self-pay

## 2013-01-29 VITALS — BP 126/80 | HR 71 | Temp 98.0°F | Ht 69.0 in | Wt 124.6 lb

## 2013-01-29 DIAGNOSIS — J189 Pneumonia, unspecified organism: Secondary | ICD-10-CM

## 2013-01-29 DIAGNOSIS — J449 Chronic obstructive pulmonary disease, unspecified: Secondary | ICD-10-CM

## 2013-01-29 MED ORDER — PREDNISONE (PAK) 5 MG PO TABS: ORAL_TABLET | ORAL | Status: DC

## 2013-01-29 MED ORDER — LEVOFLOXACIN 500 MG PO TABS: 500.0000 mg | ORAL_TABLET | Freq: Every day | ORAL | Status: DC

## 2013-01-29 MED ORDER — DIAZEPAM 5 MG PO TABS: ORAL_TABLET | ORAL | Status: DC

## 2013-01-29 NOTE — Patient Instructions (Signed)
Increase prednisone to 4 daily until better then 2 daily  levaquin 500 mg daily x 7 days   For breathing difficulty: use your albuterol neb as a rescue medication to be used if you are short of breath  - Ok to use up to every 4 hours if you must but call for immediate appointment if use goes up over your usual need  For thick mucus/ congestion  mucinex or mucinex dm up to 1200 mg every 12 hours   Please remember to go to the   x-ray department downstairs for your tests - we will call you with the results when they are available.

## 2013-01-29 NOTE — Assessment & Plan Note (Signed)
Very poor insight into meds re maint vs prn,  Already tried med calendar but not using, really looks to be nearing snf status   For now try 20 mg pred per day until better then resume 10 mg daily     Each maintenance medication was reviewed in detail including most importantly the difference between maintenance and as needed and under what circumstances the prns are to be used.  Please see instructions for details which were reviewed in writing and the patient given a copy.

## 2013-01-29 NOTE — Telephone Encounter (Signed)
Pt seen on 01-23-13. He states he finished  zpak yesterday. He states that he is not any better. He is c/o productive cough with green phlegm, increased SOB, and chest tightness. Appt set for today at 4:30pm. Carron Curie, CMA

## 2013-01-29 NOTE — Assessment & Plan Note (Signed)
cxr shows clearing, prob because this was atx, not pna, as hx suggests as well.  Problem is he's still reporting purulent sputum p zpak  rec levaquin x 7 days, mucinex/ prn nebs  See instructions for specific recommendations which were reviewed directly with the patient who was given a copy with highlighter outlining the key components.

## 2013-01-29 NOTE — Progress Notes (Signed)
Subjective:    Patient ID: [name], male    DOB: 06/21/1934, 78 y.o.   MRN: [id number]  HPI Followup copd - midst of depression, Gi issues (pancreatic mass ?), anxiety, smoking, ortho surgery, deconditioning, social isolation  Now better  - weight, mood, conditioning. Now in SNF rehab. COPD is stable. Will be dc'ed home in few days with home PT. He has not active resp complaints that are worse. Overall he is getting better. GI workup in progress; some pancreatic mass. PET scan pending per hx. He is on pureed diet. Uptodate with flu and pneumovax. He is off smoking since going to [employer]. COPD CAT score is 32    reports that he quit smoking about 3 weeks ago. His smoking use included Cigarettes. He has a 60 pack-year smoking history. He has never used smokeless tobacco.  REC Glad you are better  Copd disease state is stable  Continue current medication regimen  Continue PT/rehab  Do alpha 1 genetic test for copd today 02/27/2012 - >LIKELY MS with a level 149mg % Return in 3 months with pft test at followup to see severity of disease   OV 06/25/2012  Followup for COPD. In terms of COPD he is doing well. Symptoms are stable. COPD cat score is 28 and improved from 32 and January 2014. His weight is also improved. Spirometry 06/25/12"  fev1 Pos BD fev1 1.17L/42% (+5% bd response), FVC 3.4L/88%, R 34, DLCO 36% and is consistent with Gold stage III COPD. He is on Symbicort not on Spiriva and is using nebulizers randomly   His main issues that he appears without any motivation. I cannot figure it is depressed anxious. He spent most of the time in the office complaining about social isolation and the feeling that he has been disowned by his kids. I checked the HADS depression and anxiety scale and he scored indeterminate for both the overall he admits to some amount of poor mood  Past, Family, Social reviewed: no change since last visit  REC #DEpression/Anxiety  - start zoloft 50mg  daily  - return in 4 weeks to see NP and depending on course will increase dose  #COPD  - severe copd  - contnue advair - start spiriva  1 puff daily ; learn technique  = use nebulizer only if needed  #Followup 1 month fu with NP to do med calendar and review depression   01/23/2013  Acute  ov/Monika Chestang re: copd/ polypharmacy no med calendar  Chief Complaint  Patient presents with  . Acute Visit    sob MR pt, worsening x1 month. Pain across mid area when breathing  prednisone 5 mg 2 each am, mucus has turned darker, thicker, esp in am and more difficult to cough up sputum - onset of sob/ cough   insidious and minimally progressive. Confused with meds/ timing especially (uses spiriva in evening)  rec Plan A = automatic =  Advair / spiriva each  Am and advair again in evening Plan B = backup = Only use your albuterol as a rescue medication zpak should turn the mucus clear and if not we'll need to get you something stronger. For cough/ congestion > mucinex dm up to 1200 mg every 12 hours    01/29/2013 f/u ov/Bernie Fobes re:  Copd/ ? Lingular pna Chief Complaint  Patient presents with  . Acute Visit    Pt c/o SOB progressively worse for the past wk. He states he is coughing less but wheezing more the past couple  of days.   Not using neb atl all x 2 days , not using mucinex.  Mucus still dark yellow p completed zpak Still maintained on pred 10 mg daily       No obvious day to day or daytime variabilty or assoc cp  subjective wheeze overt sinus or hb symptoms. No unusual exp hx or h/o childhood pna/ asthma or knowledge of premature birth.  Sleeping ok without nocturnal  or early am exacerbation  of respiratory  c/o's or need for noct saba. Also denies any obvious fluctuation of symptoms with weather or environmental changes or other aggravating or alleviating factors except as outlined above   Current Medications, Allergies, Complete Past Medical History, Past Surgical History, Family History, and  Social History were reviewed in [employer] record.  ROS  The following are not active complaints unless bolded sore throat, dysphagia, dental problems, itching, sneezing,  nasal congestion or excess/ purulent secretions, ear ache,   fever, chills, sweats, unintended wt loss, pleuritic or exertional cp, hemoptysis,  orthopnea pnd or leg swelling, presyncope, palpitations, heartburn, abdominal pain, anorexia, nausea, vomiting, diarrhea  or change in bowel or urinary habits, change in stools or urine, dysuria,hematuria,  rash, arthralgias, visual complaints, headache, numbness weakness or ataxia or problems with walking (has walker)   or coordination,  change in mood/affect or memory.                       Objective:   Physical Exam  Disheveled amb wm nad rambling from one complaint to the next s answering specific questions before shifting to another body part  01/29/2013      124  Wt Readings from Last 3 Encounters:  01/23/13 124 lb 6.4 oz (56.427 kg)  11/06/12 121 lb (54.885 kg)  04/27/12 117 lb 8 oz (53.298 kg)     HEENT mild turbinate edema.  Oropharynx no thrush or excess pnd or cobblestoning.  No JVD or cervical adenopathy. Mild accessory muscle hypertrophy. Trachea midline, nl thryroid. Chest was hyperinflated by percussion with diminished breath sounds and moderate increased exp time without wheeze. Hoover sign positive at mid inspiration. Regular rate and rhythm without murmur gallop or rub or increase P2 or edema.  Abd: no hsm, nl excursion. Ext warm without cyanosis or clubbing.        .          cxr 01/23/13 The cardiac silhouette, mediastinal and hilar contours are within  normal limits and stable. Advanced emphysematous changes and  pulmonary scarring. Increased bibasilar density. Suspect overlying  lingular pneumonia.    CXR  01/29/2013 : COPD changes with scattered interstitial prominence and left basilar scarring. No acute  infiltrate, pleural effusion, or pneumothorax. No definite lingular infiltrate identified.        Assessment & Plan:

## 2013-01-30 NOTE — Progress Notes (Signed)
Quick Note:  Spoke with pt and notified of results per Dr. Wert. Pt verbalized understanding and denied any questions.  ______ 

## 2013-01-30 NOTE — Telephone Encounter (Signed)
Rx faxed to [facility] at [phone].

## 2013-01-30 NOTE — Progress Notes (Signed)
Quick Note:  LMTCB ______ 

## 2013-02-04 ENCOUNTER — Other Ambulatory Visit: Payer: Self-pay | Admitting: Neurology

## 2013-02-04 ENCOUNTER — Telehealth: Payer: Self-pay | Admitting: Internal Medicine

## 2013-02-04 DIAGNOSIS — M545 Low back pain: Secondary | ICD-10-CM

## 2013-02-04 NOTE — Telephone Encounter (Signed)
All I can recommend at this point is stop advair and just use the neb every 4 hours, if not better go to er

## 2013-02-04 NOTE — Telephone Encounter (Signed)
Pt is aware of MW recs by message. Due to our office closing in the next 10 minutes, I did not want to leave a message for him to call us back and our office be closed.

## 2013-02-04 NOTE — Telephone Encounter (Signed)
Pt reports increased SOB, slight cough and wheezing. Doesn't feel any better since seeing MW on 01/29/13. Has one day left of Levaquin and is taking Prednisone 40mg  and Mucinex as directed with no relief. Wants to know what to do now, he's scared to go to sleep with the way his breathing is.  MW - please advise. Thanks.

## 2013-02-21 ENCOUNTER — Other Ambulatory Visit: Payer: Self-pay | Admitting: *Deleted

## 2013-02-21 MED ORDER — OXYCODONE HCL 30 MG PO TABS: 30.0000 mg | ORAL_TABLET | ORAL | Status: DC | PRN

## 2013-02-21 NOTE — Telephone Encounter (Signed)
Called patient and let him know Rx ready for pick up

## 2013-02-22 ENCOUNTER — Telehealth: Payer: Self-pay | Admitting: Internal Medicine

## 2013-02-22 MED ORDER — FLUTICASONE-SALMETEROL 250-50 MCG/DOSE IN AEPB: 1.0000 | INHALATION_SPRAY | Freq: Two times a day (BID) | RESPIRATORY_TRACT | Status: DC

## 2013-02-22 MED ORDER — TIOTROPIUM BROMIDE MONOHYDRATE 18 MCG IN CAPS: 18.0000 ug | ORAL_CAPSULE | Freq: Every day | RESPIRATORY_TRACT | Status: DC

## 2013-02-22 NOTE — Telephone Encounter (Signed)
Spoke with pt and aware samples left for pick up of spiriva and advair. Nothing further needed

## 2013-02-26 ENCOUNTER — Ambulatory Visit: Payer: Medicare Other | Admitting: Internal Medicine

## 2013-03-18 ENCOUNTER — Telehealth: Payer: Self-pay | Admitting: Neurology

## 2013-03-18 MED ORDER — OXYCODONE HCL 30 MG PO TABS: 30.0000 mg | ORAL_TABLET | ORAL | Status: DC | PRN

## 2013-03-18 NOTE — Telephone Encounter (Signed)
Patient is requesting a refill on Oxycodone.  #200 last prescribed on 01/08.  Patient told front desk he only has one pill remaining because he took extra because of all of his problems.  I called the patient back to try and get clarification of his problems, but got no answer.  Would you like to refill early?  Please advise.  Thank you.

## 2013-03-18 NOTE — Telephone Encounter (Signed)
Pt called in and stated he needed his Oxycodone prescription refilled.  He stated that he has had to take more because of all of his problems.  He only has 1 pill left.  Please call when ready to pick up

## 2013-03-18 NOTE — Telephone Encounter (Signed)
I called the patient. He is about out of his pain medications, associated with an increase in the leg pain.  The prescription is 3 days early. I will refill early, but the patient needs to be careful about overusing the medications.

## 2013-03-19 ENCOUNTER — Other Ambulatory Visit: Payer: Self-pay | Admitting: Neurology

## 2013-03-19 NOTE — Telephone Encounter (Signed)
Called patient and left message informing him that his Rx was ready to be picked up at the front desk and if he has any other problems, questions or concerns to call the office.

## 2013-03-20 ENCOUNTER — Other Ambulatory Visit: Payer: Self-pay | Admitting: Internal Medicine

## 2013-03-25 IMAGING — CT NM PET TUM IMG INITIAL (PI) SKULL BASE T - THIGH
6 series · 25 of 25 positions shown · IV contrast (350 OM)
Comparison: CT abdomen 01/26/2012

CLINICAL DATA: Initial treatment strategy for weight loss and
biliary obstruction.

NUCLEAR MEDICINE PET SKULL BASE TO THIGH
Fasting Blood Glucose:  95
TECHNIQUE: 16.8 mCi F-18 FDG was injected intravenously. CT data
was obtained and used for attenuation correction and anatomic
localization only.  (This was not acquired as a diagnostic CT
examination.) Additional exam technical data entered on
technologist worksheet.

[Series 1: pet ac · axial · 3.3mm · 4.69mm/px · z∈[-884,-14]mm · 5 of 267 slices shown]
[im 1/267]
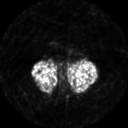
[im 67/267]
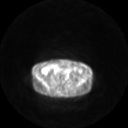
[im 134/267]
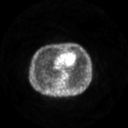
[im 200/267]
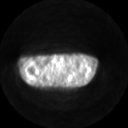
[im 267/267]
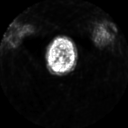

[Series 2: pet nac · axial · 3.3mm · 4.69mm/px · z∈[-884,-14]mm · 6 of 267 slices shown]
[im 1/267]
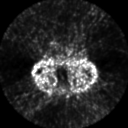
[im 54/267]
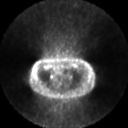
[im 107/267]
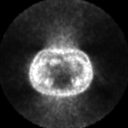
[im 160/267]
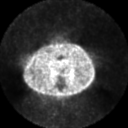
[im 213/267]
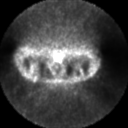
[im 267/267]
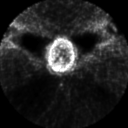

[Series 2: ct images · axial · 3.8mm · 0.98mm/px · z∈[-884,-14]mm · 5 of 267 slices shown]
[im 1/267]
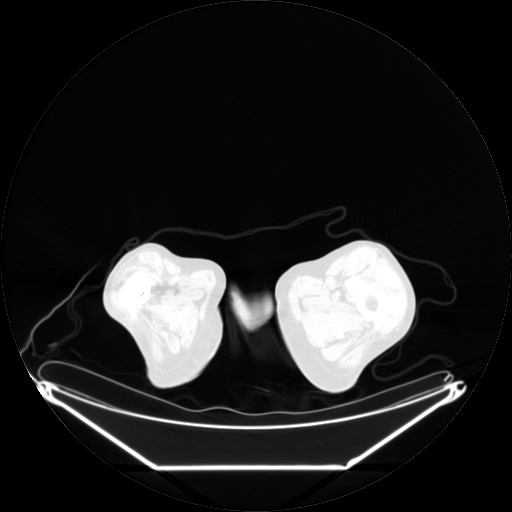
[im 67/267]
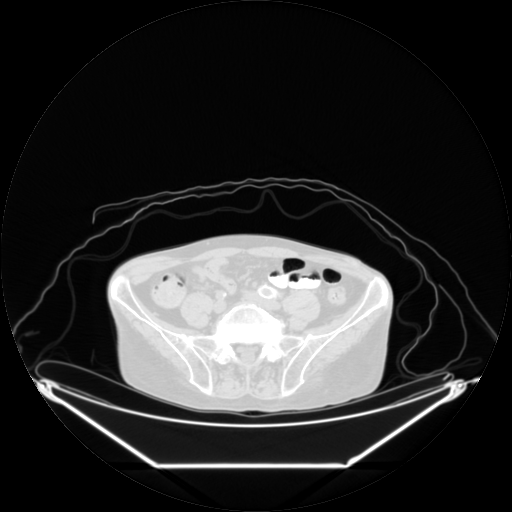
[im 134/267]
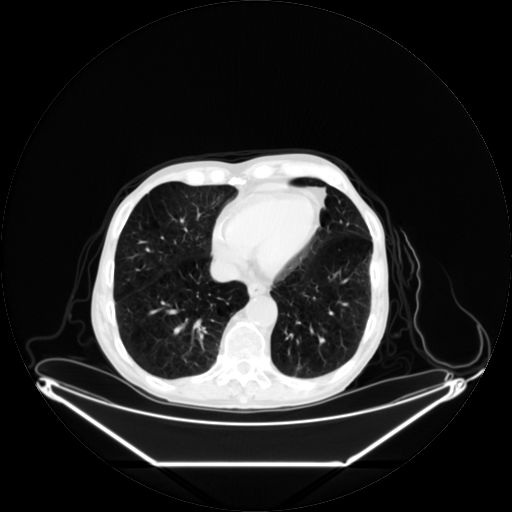
[im 200/267]
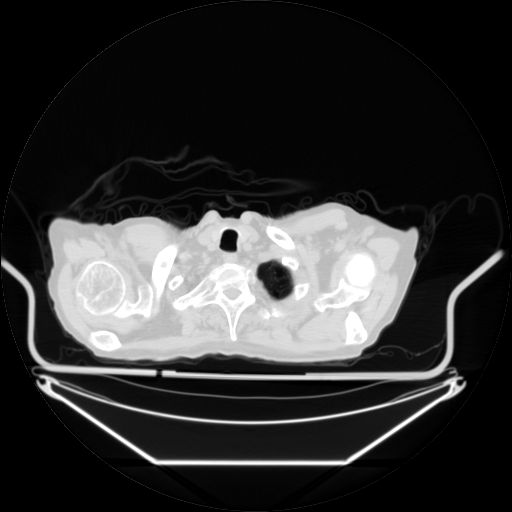
[im 267/267  brain]
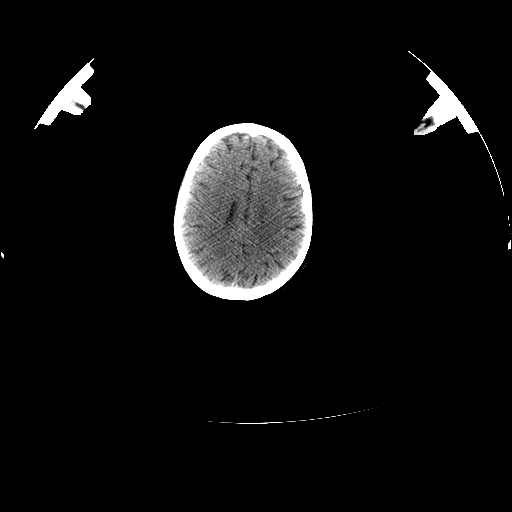

[Series 123: mip · coronal · 3.3mm · 4.69mm/px · 1 of 30 slices shown]
[im 1/30]
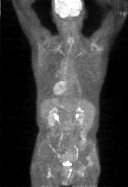

[Series 151: reformatted · axial · 3.3mm · 3.91mm/px · z∈[-884,-14]mm · 6 of 267 slices shown (1 of 2)]
[im 1/267]
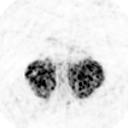
[im 54/267]
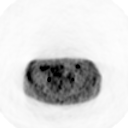
[im 107/267]
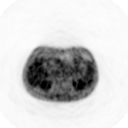
[im 160/267]
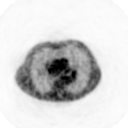
[im 213/267]
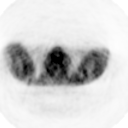
[im 267/267]
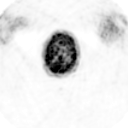

[Series 153: reformatted · coronal · 4.7mm · 6.98mm/px · 2 of 77 slices shown (2 of 2)]
[im 1/77]
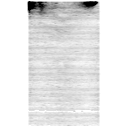
[im 77/77]
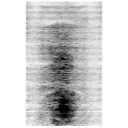

[25 of 25 positions shown; findings below may reference images not displayed]

FINDINGS: Neck: No hypermetabolic lymph nodes in the neck.

Chest:  No hypermetabolic mediastinal or hilar nodes.  No
suspicious pulmonary nodules on the CT scan.

Abdomen/Pelvis:  No abnormal hypermetabolic activity within the
liver, pancreas, adrenal glands, or spleen.  No hypermetabolic
lymph nodes in the abdomen or pelvis.

Again demonstrated intrahepatic and  extrahepatic biliary duct
dilatation without obstructing or hypermetabolic lesion.

Skeleton:  No focal hypermetabolic activity to suggest skeletal
metastasis.
IMPRESSION: No evidence of primary malignancy by FDG PET scan.

## 2013-04-15 ENCOUNTER — Other Ambulatory Visit: Payer: Self-pay | Admitting: Neurology

## 2013-04-15 MED ORDER — OXYCODONE HCL 30 MG PO TABS: 30.0000 mg | ORAL_TABLET | ORAL | Status: DC | PRN

## 2013-04-15 NOTE — Telephone Encounter (Signed)
Pt called regarding prescription for oxycodone (ROXICODONE) 30 MG immediate release tablet. Please call when it is ready.  Thank you

## 2013-04-16 ENCOUNTER — Telehealth: Payer: Self-pay | Admitting: Neurology

## 2013-04-16 NOTE — Telephone Encounter (Signed)
Called patient and left message informing him that his Rx was ready to be picked up at the front desk and if he has any other problems, questions or concerns to call the office.

## 2013-04-16 NOTE — Telephone Encounter (Signed)
Called patient for more information on problems that he is having, lt VM message

## 2013-04-17 NOTE — Telephone Encounter (Signed)
Spoke with patient and he said that his neuropathy is bad , can't walk, sores on both legs, red spots, not sleeping and feet are so cold, has a sore on the right leg that hurts to  touch

## 2013-04-17 NOTE — Telephone Encounter (Signed)
Called and left VM message for more clarification about the problems he is having

## 2013-04-17 NOTE — Telephone Encounter (Signed)
I called the patient, left a message. I am concerned that given the increase in the pain and the development of sores or ulcers on the leg that the patient is having increasing problems with peripheral vascular disease. The patient has known issues in this regard. This certainly would increase the discomfort. I'll try to call the patient back later.

## 2013-04-18 NOTE — Telephone Encounter (Signed)
I called the patient again, left message again. The patient is to call me if he is having ongoing issues. If the patient is developing sores on his legs, he may need to be seen again for evaluation of his circulation in the legs as he does have a history of peripheral vascular disease. The patient will call me again if needed.

## 2013-04-20 ENCOUNTER — Encounter (HOSPITAL_COMMUNITY): Payer: Self-pay | Admitting: Emergency Medicine

## 2013-04-20 ENCOUNTER — Inpatient Hospital Stay (HOSPITAL_COMMUNITY)
Admission: EM | Admit: 2013-04-20 | Discharge: 2013-05-07 | DRG: 853 | Disposition: A | Discharge status: Skilled Nursing Facility | Payer: Medicare Other | Attending: Vascular Surgery | Admitting: Vascular Surgery

## 2013-04-20 DIAGNOSIS — R1310 Dysphagia, unspecified: Secondary | ICD-10-CM | POA: Diagnosis present

## 2013-04-20 DIAGNOSIS — K208 Other esophagitis without bleeding: Secondary | ICD-10-CM | POA: Diagnosis present

## 2013-04-20 DIAGNOSIS — K264 Chronic or unspecified duodenal ulcer with hemorrhage: Secondary | ICD-10-CM | POA: Diagnosis present

## 2013-04-20 DIAGNOSIS — B3781 Candidal esophagitis: Secondary | ICD-10-CM | POA: Diagnosis present

## 2013-04-20 DIAGNOSIS — A419 Sepsis, unspecified organism: Principal | ICD-10-CM | POA: Diagnosis present

## 2013-04-20 DIAGNOSIS — N400 Enlarged prostate without lower urinary tract symptoms: Secondary | ICD-10-CM | POA: Diagnosis present

## 2013-04-20 DIAGNOSIS — M069 Rheumatoid arthritis, unspecified: Secondary | ICD-10-CM | POA: Diagnosis present

## 2013-04-20 DIAGNOSIS — J189 Pneumonia, unspecified organism: Secondary | ICD-10-CM

## 2013-04-20 DIAGNOSIS — E785 Hyperlipidemia, unspecified: Secondary | ICD-10-CM | POA: Diagnosis present

## 2013-04-20 DIAGNOSIS — K922 Gastrointestinal hemorrhage, unspecified: Secondary | ICD-10-CM

## 2013-04-20 DIAGNOSIS — J4489 Other specified chronic obstructive pulmonary disease: Secondary | ICD-10-CM | POA: Diagnosis present

## 2013-04-20 DIAGNOSIS — Z880 Allergy status to penicillin: Secondary | ICD-10-CM

## 2013-04-20 DIAGNOSIS — A048 Other specified bacterial intestinal infections: Secondary | ICD-10-CM | POA: Diagnosis present

## 2013-04-20 DIAGNOSIS — D696 Thrombocytopenia, unspecified: Secondary | ICD-10-CM

## 2013-04-20 DIAGNOSIS — F32A Depression, unspecified: Secondary | ICD-10-CM

## 2013-04-20 DIAGNOSIS — R1010 Upper abdominal pain, unspecified: Secondary | ICD-10-CM

## 2013-04-20 DIAGNOSIS — L0390 Cellulitis, unspecified: Secondary | ICD-10-CM

## 2013-04-20 DIAGNOSIS — R059 Cough, unspecified: Secondary | ICD-10-CM | POA: Clinically undetermined

## 2013-04-20 DIAGNOSIS — I10 Essential (primary) hypertension: Secondary | ICD-10-CM | POA: Diagnosis present

## 2013-04-20 DIAGNOSIS — L98499 Non-pressure chronic ulcer of skin of other sites with unspecified severity: Secondary | ICD-10-CM | POA: Diagnosis present

## 2013-04-20 DIAGNOSIS — Z8249 Family history of ischemic heart disease and other diseases of the circulatory system: Secondary | ICD-10-CM

## 2013-04-20 DIAGNOSIS — F419 Anxiety disorder, unspecified: Secondary | ICD-10-CM

## 2013-04-20 DIAGNOSIS — I739 Peripheral vascular disease, unspecified: Secondary | ICD-10-CM | POA: Diagnosis present

## 2013-04-20 DIAGNOSIS — Z823 Family history of stroke: Secondary | ICD-10-CM

## 2013-04-20 DIAGNOSIS — Z888 Allergy status to other drugs, medicaments and biological substances status: Secondary | ICD-10-CM

## 2013-04-20 DIAGNOSIS — R197 Diarrhea, unspecified: Secondary | ICD-10-CM

## 2013-04-20 DIAGNOSIS — D500 Iron deficiency anemia secondary to blood loss (chronic): Secondary | ICD-10-CM | POA: Diagnosis present

## 2013-04-20 DIAGNOSIS — W19XXXA Unspecified fall, initial encounter: Secondary | ICD-10-CM

## 2013-04-20 DIAGNOSIS — R55 Syncope and collapse: Secondary | ICD-10-CM

## 2013-04-20 DIAGNOSIS — L03119 Cellulitis of unspecified part of limb: Secondary | ICD-10-CM

## 2013-04-20 DIAGNOSIS — J449 Chronic obstructive pulmonary disease, unspecified: Secondary | ICD-10-CM

## 2013-04-20 DIAGNOSIS — G609 Hereditary and idiopathic neuropathy, unspecified: Secondary | ICD-10-CM | POA: Diagnosis present

## 2013-04-20 DIAGNOSIS — D62 Acute posthemorrhagic anemia: Secondary | ICD-10-CM

## 2013-04-20 DIAGNOSIS — Z85828 Personal history of other malignant neoplasm of skin: Secondary | ICD-10-CM

## 2013-04-20 DIAGNOSIS — R636 Underweight: Secondary | ICD-10-CM | POA: Diagnosis present

## 2013-04-20 DIAGNOSIS — Z87891 Personal history of nicotine dependence: Secondary | ICD-10-CM

## 2013-04-20 DIAGNOSIS — R532 Functional quadriplegia: Secondary | ICD-10-CM | POA: Diagnosis present

## 2013-04-20 DIAGNOSIS — IMO0002 Reserved for concepts with insufficient information to code with codable children: Secondary | ICD-10-CM

## 2013-04-20 DIAGNOSIS — S7290XA Unspecified fracture of unspecified femur, initial encounter for closed fracture: Secondary | ICD-10-CM

## 2013-04-20 DIAGNOSIS — K8689 Other specified diseases of pancreas: Secondary | ICD-10-CM

## 2013-04-20 DIAGNOSIS — M47817 Spondylosis without myelopathy or radiculopathy, lumbosacral region: Secondary | ICD-10-CM

## 2013-04-20 DIAGNOSIS — G63 Polyneuropathy in diseases classified elsewhere: Secondary | ICD-10-CM

## 2013-04-20 DIAGNOSIS — E43 Unspecified severe protein-calorie malnutrition: Secondary | ICD-10-CM | POA: Diagnosis present

## 2013-04-20 DIAGNOSIS — R05 Cough: Secondary | ICD-10-CM | POA: Clinically undetermined

## 2013-04-20 DIAGNOSIS — L02419 Cutaneous abscess of limb, unspecified: Secondary | ICD-10-CM | POA: Diagnosis present

## 2013-04-20 DIAGNOSIS — D519 Vitamin B12 deficiency anemia, unspecified: Secondary | ICD-10-CM | POA: Diagnosis not present

## 2013-04-20 DIAGNOSIS — L97209 Non-pressure chronic ulcer of unspecified calf with unspecified severity: Secondary | ICD-10-CM | POA: Diagnosis present

## 2013-04-20 DIAGNOSIS — J441 Chronic obstructive pulmonary disease with (acute) exacerbation: Secondary | ICD-10-CM

## 2013-04-20 DIAGNOSIS — R1313 Dysphagia, pharyngeal phase: Secondary | ICD-10-CM | POA: Diagnosis present

## 2013-04-20 DIAGNOSIS — I7092 Chronic total occlusion of artery of the extremities: Secondary | ICD-10-CM | POA: Diagnosis present

## 2013-04-20 DIAGNOSIS — Z681 Body mass index (BMI) 19 or less, adult: Secondary | ICD-10-CM

## 2013-04-20 DIAGNOSIS — E876 Hypokalemia: Secondary | ICD-10-CM | POA: Diagnosis present

## 2013-04-20 DIAGNOSIS — L0291 Cutaneous abscess, unspecified: Secondary | ICD-10-CM

## 2013-04-20 DIAGNOSIS — Z96649 Presence of unspecified artificial hip joint: Secondary | ICD-10-CM

## 2013-04-20 DIAGNOSIS — R64 Cachexia: Secondary | ICD-10-CM

## 2013-04-20 DIAGNOSIS — R634 Abnormal weight loss: Secondary | ICD-10-CM

## 2013-04-20 DIAGNOSIS — R5381 Other malaise: Secondary | ICD-10-CM | POA: Diagnosis present

## 2013-04-20 DIAGNOSIS — F329 Major depressive disorder, single episode, unspecified: Secondary | ICD-10-CM

## 2013-04-20 DIAGNOSIS — Z8546 Personal history of malignant neoplasm of prostate: Secondary | ICD-10-CM

## 2013-04-20 HISTORY — DX: Repeated falls: R29.6

## 2013-04-20 HISTORY — DX: Cellulitis of right lower limb: L03.115

## 2013-04-20 LAB — BASIC METABOLIC PANEL
BUN: 26 mg/dL — AB (ref 6–23)
CO2: 27 mEq/L (ref 19–32)
Calcium: 9.9 mg/dL (ref 8.4–10.5)
Chloride: 88 mEq/L — ABNORMAL LOW (ref 96–112)
Creatinine, Ser: 1.23 mg/dL (ref 0.50–1.35)
GFR calc Af Amer: 64 mL/min — ABNORMAL LOW (ref >90)
GFR, EST NON AFRICAN AMERICAN: 55 mL/min — AB (ref >90)
Glucose, Bld: 151 mg/dL — ABNORMAL HIGH (ref 70–99)
Potassium: 3.9 mEq/L (ref 3.7–5.3)
SODIUM: 132 meq/L — AB (ref 137–147)

## 2013-04-20 LAB — CBC
HCT: 42.9 % (ref 39.0–52.0)
HEMOGLOBIN: 15.6 g/dL (ref 13.0–17.0)
MCH: 34.4 pg — AB (ref 26.0–34.0)
MCHC: 36.4 g/dL — ABNORMAL HIGH (ref 30.0–36.0)
MCV: 94.7 fL (ref 78.0–100.0)
Platelets: 127 10*3/uL — ABNORMAL LOW (ref 150–400)
RBC: 4.53 MIL/uL (ref 4.22–5.81)
RDW: 13.7 % (ref 11.5–15.5)
WBC: 14.6 10*3/uL — ABNORMAL HIGH (ref 4.0–10.5)

## 2013-04-20 MED ORDER — ALBUTEROL SULFATE (2.5 MG/3ML) 0.083% IN NEBU
2.5000 mg | INHALATION_SOLUTION | RESPIRATORY_TRACT | Status: DC | PRN
  Administered 2013-04-25: 20:00:00 2.5 mg via RESPIRATORY_TRACT
  Filled 2013-04-20: qty 3

## 2013-04-20 MED ORDER — PREDNISOLONE 5 MG PO TABS
5.0000 mg | ORAL_TABLET | Freq: Every day | ORAL | Status: DC
  Administered 2013-04-20 – 2013-05-07 (×17): 5 mg via ORAL
  Filled 2013-04-20 (×19): qty 1

## 2013-04-20 MED ORDER — SODIUM CHLORIDE 0.9 % IV SOLN
INTRAVENOUS | Status: DC
  Administered 2013-04-20: 75 mL/h via INTRAVENOUS
  Administered 2013-04-21: 14:00:00 via INTRAVENOUS
  Administered 2013-04-22: 75 mL/h via INTRAVENOUS
  Administered 2013-04-22 – 2013-04-24 (×3): via INTRAVENOUS
  Administered 2013-04-24: 75 mL/h via INTRAVENOUS
  Administered 2013-04-26: 07:00:00 via INTRAVENOUS

## 2013-04-20 MED ORDER — SERTRALINE HCL 50 MG PO TABS
50.0000 mg | ORAL_TABLET | Freq: Every day | ORAL | Status: DC
  Administered 2013-04-20 – 2013-05-07 (×15): 50 mg via ORAL
  Filled 2013-04-20 (×18): qty 1

## 2013-04-20 MED ORDER — DIAZEPAM 5 MG PO TABS: 10.0000 mg | ORAL_TABLET | Freq: Every day | ORAL | Status: DC

## 2013-04-20 MED ORDER — DIAZEPAM 5 MG PO TABS: 5.0000 mg | ORAL_TABLET | ORAL | Status: DC

## 2013-04-20 MED ORDER — HYDROMORPHONE HCL PF 1 MG/ML IJ SOLN
1.0000 mg | INTRAMUSCULAR | Status: DC | PRN
  Administered 2013-04-21 – 2013-04-22 (×5): 1 mg via INTRAVENOUS
  Filled 2013-04-20 (×6): qty 1

## 2013-04-20 MED ORDER — VANCOMYCIN HCL IN DEXTROSE 1-5 GM/200ML-% IV SOLN
1000.0000 mg | Freq: Once | INTRAVENOUS | Status: AC
  Administered 2013-04-20: 1000 mg via INTRAVENOUS
  Filled 2013-04-20: qty 200

## 2013-04-20 MED ORDER — ROSUVASTATIN CALCIUM 20 MG PO TABS
20.0000 mg | ORAL_TABLET | Freq: Every day | ORAL | Status: DC
  Administered 2013-04-21 – 2013-04-26 (×5): 20 mg via ORAL
  Filled 2013-04-20 (×7): qty 1

## 2013-04-20 MED ORDER — TRAZODONE HCL 50 MG PO TABS
50.0000 mg | ORAL_TABLET | Freq: Every day | ORAL | Status: DC
  Administered 2013-04-20 – 2013-05-06 (×16): 50 mg via ORAL
  Filled 2013-04-20 (×19): qty 1

## 2013-04-20 MED ORDER — HYDROMORPHONE HCL PF 1 MG/ML IJ SOLN
1.0000 mg | Freq: Once | INTRAMUSCULAR | Status: AC
  Administered 2013-04-20: 1 mg via INTRAVENOUS
  Filled 2013-04-20: qty 1

## 2013-04-20 MED ORDER — MOMETASONE FURO-FORMOTEROL FUM 100-5 MCG/ACT IN AERO
2.0000 | INHALATION_SPRAY | Freq: Two times a day (BID) | RESPIRATORY_TRACT | Status: DC
  Administered 2013-04-20 – 2013-05-07 (×29): 2 via RESPIRATORY_TRACT
  Filled 2013-04-20 (×3): qty 8.8

## 2013-04-20 MED ORDER — HYDROXYZINE HCL 25 MG PO TABS
25.0000 mg | ORAL_TABLET | Freq: Two times a day (BID) | ORAL | Status: DC | PRN
  Administered 2013-04-26: 10:00:00 25 mg via ORAL
  Filled 2013-04-20: qty 1

## 2013-04-20 MED ORDER — ALPRAZOLAM 0.5 MG PO TABS
1.0000 mg | ORAL_TABLET | Freq: Every day | ORAL | Status: DC
  Administered 2013-04-20 – 2013-04-23 (×4): 1 mg via ORAL
  Filled 2013-04-20 (×4): qty 2

## 2013-04-20 MED ORDER — TIOTROPIUM BROMIDE MONOHYDRATE 18 MCG IN CAPS
18.0000 ug | ORAL_CAPSULE | Freq: Every day | RESPIRATORY_TRACT | Status: DC
Start: 2013-04-21 — End: 2013-05-07
  Administered 2013-04-22 – 2013-05-07 (×14): 18 ug via RESPIRATORY_TRACT
  Filled 2013-04-20 (×3): qty 5

## 2013-04-20 MED ORDER — HEPARIN SODIUM (PORCINE) 5000 UNIT/ML IJ SOLN
5000.0000 [IU] | Freq: Three times a day (TID) | INTRAMUSCULAR | Status: DC
  Administered 2013-04-20 – 2013-04-26 (×16): 5000 [IU] via SUBCUTANEOUS
  Filled 2013-04-20 (×22): qty 1

## 2013-04-20 MED ORDER — VANCOMYCIN HCL IN DEXTROSE 750-5 MG/150ML-% IV SOLN
750.0000 mg | INTRAVENOUS | Status: DC
  Administered 2013-04-21 – 2013-04-23 (×3): 750 mg via INTRAVENOUS
  Filled 2013-04-20 (×3): qty 150

## 2013-04-20 MED ORDER — DIAZEPAM 5 MG PO TABS
10.0000 mg | ORAL_TABLET | Freq: Every day | ORAL | Status: DC
  Administered 2013-04-21 – 2013-04-22 (×2): 10 mg via ORAL
  Filled 2013-04-20 (×2): qty 2

## 2013-04-20 MED ORDER — DIAZEPAM 5 MG PO TABS
5.0000 mg | ORAL_TABLET | Freq: Every day | ORAL | Status: DC
  Administered 2013-04-20 – 2013-04-22 (×3): 5 mg via ORAL
  Filled 2013-04-20 (×3): qty 1

## 2013-04-20 MED ORDER — ACETAMINOPHEN 650 MG RE SUPP
650.0000 mg | Freq: Four times a day (QID) | RECTAL | Status: DC | PRN
  Administered 2013-04-20: 650 mg via RECTAL
  Filled 2013-04-20 (×2): qty 1

## 2013-04-20 NOTE — ED Provider Notes (Signed)
CSN: [id number]     Arrival date & time 04/20/13  1713 History   First MD Initiated Contact with Patient 04/20/13 1737     Chief Complaint  Patient presents with  . bi-lateral leg pain      (Consider location/radiation/quality/duration/timing/severity/associated sxs/prior Treatment) The history is provided by the patient and a relative.   Patient here with right lower extremity swelling and erythema erythema x1 week. Symptoms has been progressively worse. Patient also notes pain to the area is well characterized as sharp and worse with standing or movement. Denies any pleuritic chest pain. Denies any recent injury. No fever or chills. No treatment used prior to arrival. Nothing makes his symptoms better Past Medical History  Diagnosis Date  . Hyperlipidemia   . History of colon polyps   . Spinal stenosis   . Anxiety   . BPH (benign prostatic hypertrophy)   . ED (erectile dysfunction)   . Osteoporosis   . Hypertension   . Chest pain     "I've had it" (01/31/2012)   . Peripheral vascular disease     "right leg is 100% blocked; left leg has stent, still ~ 75% blocked" (01/31/2012)  . Peripheral neuropathy     "bad" (01/31/2012)  . COPD (chronic obstructive pulmonary disease)   . Emphysema   . Pneumonia 1960's    "double" (01/31/2012)  . BRBPR (bright red blood per rectum)     "don't know what it's from; had some this week" (01/31/2012)  . Cervical spondylosis   . Rheumatoid arthritis(714.0)   . Arthritis     "hands and feet" (01/31/2012)  . DDD (degenerative disc disease), lumbosacral     "S1; L5" (01/31/2012)  . Chronic lower back pain   . Depression     "imagine I've got a mild depression w/what I've gone thru last month" (01/31/2012)  . Prostate cancer 2004    "had 40 treatments of radiation" (01/31/2012)  . Breast cancer in male     "left" (01/31/2012)  . Basal cell carcinoma of nasal tip     "  . Pernicious anemia   . GERD (gastroesophageal reflux disease)   .  Polyneuropathy in other diseases classified elsewhere 11/06/2012  . Lumbosacral spondylosis without myelopathy 11/06/2012   Past Surgical History  Procedure Laterality Date  . Basal cell carcinoma excision      Nose x 3  . Cholecystectomy  ?1990's  . Wrist fusion      Right Wrist; "3 OR's; it's fused" (01/31/2012)  . Excisional hemorrhoidectomy  1960's  . Knee surgery      "right; X 6 surgeries; went in for simple cartilage OR; ended up w/fused knee" (01/31/2012)  . Iliac artery stent  12-07-09    Stent done by Dr. Gwenlyn Found  . Total hip arthroplasty  08/16/2011    Procedure: TOTAL HIP ARTHROPLASTY ANTERIOR APPROACH;  Surgeon: Mcarthur Rossetti, MD;  Location: [facility];  Service: Orthopedics;  Laterality: Right;  Right total hip replacement  . Cervical disc surgery      "7 total; ended w/a fusion" (01/31/2012)  . Esophagogastroduodenoscopy  02/22/2012    Procedure: ESOPHAGOGASTRODUODENOSCOPY (EGD);  Surgeon: Arta Silence, MD;  Location: [facility];  Service: Endoscopy;  Laterality: N/A;  . Eus  02/22/2012    Procedure: UPPER ENDOSCOPIC ULTRASOUND (EUS) RADIAL;  Surgeon: Arta Silence, MD;  Location: [facility];  Service: Endoscopy;  Laterality: N/A;  . Hernia repair    . Transurethral resection of prostate     Family History  Problem Relation Age of Onset  . Hypertension Mother   . Cancer Mother     colon cancer  . Stroke Mother   . Aneurysm Father     abdominal aortic  . Heart disease Brother    History  Substance Use Topics  . Smoking status: Former Smoker -- 0.20 packs/day for 60 years    Types: Cigarettes    Quit date: 01/31/2012  . Smokeless tobacco: Never Used  . Alcohol Use: No     Comment: 01/31/2012 "heavy drinker 35 years ago; don't drink anymore at all now; had a beer ~ 10 yr ago"    Review of Systems  All other systems reviewed and are negative.      Allergies  Lipitor; Penicillins; and Tiotropium bromide monohydrate  Home Medications   Current  Outpatient Rx  Name  Route  Sig  Dispense  Refill  . albuterol (PROVENTIL) (2.5 MG/3ML) 0.083% nebulizer solution   Nebulization   Take 2.5 mg by nebulization every 4 (four) hours as needed for wheezing or shortness of breath.         . ALPRAZolam (XANAX) 1 MG tablet   Oral   Take 1 mg by mouth at bedtime.          . diazepam (VALIUM) 5 MG tablet      One tablet twice during the day and two tablets at night   120 tablet   5     Pharmacy Fax [phone]   . Fluticasone-Salmeterol (ADVAIR) 250-50 MCG/DOSE AEPB   Inhalation   Inhale 1 puff into the lungs every 12 (twelve) hours.   2 each   0   . hydrOXYzine (ATARAX/VISTARIL) 25 MG tablet   Oral   Take 25 mg by mouth 2 (two) times daily as needed for anxiety.         Marland Kitchen levofloxacin (LEVAQUIN) 500 MG tablet   Oral   Take 1 tablet (500 mg total) by mouth daily.   7 tablet   0   . oxycodone (ROXICODONE) 30 MG immediate release tablet   Oral   Take 1 tablet (30 mg total) by mouth every 3 (three) hours as needed. MUST LAST 28 DAYS   200 tablet   0     Please call patient for pick up   . predniSONE (DELTASONE) 5 MG tablet      TAKE 2 TABLETS BY MOUTH EVERY DAY   60 tablet   6   . predniSONE (DELTASONE) 5 MG tablet      Take as directed   100 tablet   0   . rosuvastatin (CRESTOR) 20 MG tablet   Oral   Take 20 mg by mouth at bedtime.          . sertraline (ZOLOFT) 50 MG tablet   Oral   Take 1 tablet (50 mg total) by mouth daily.   30 tablet   0   . silver sulfADIAZINE (SILVADENE) 1 % cream   Topical   Apply topically daily.   50 g   0   . tiotropium (SPIRIVA) 18 MCG inhalation capsule   Inhalation   Place 1 capsule (18 mcg total) into inhaler and inhale daily.   20 capsule   0   . traZODone (DESYREL) 50 MG tablet   Oral   Take 50 mg by mouth at bedtime.          BP 161/76  Pulse 108  Temp(Src) 99.4 F (37.4 C) (Oral)  Resp  24  SpO2 100% Physical Exam  Nursing note and vitals  reviewed. Constitutional: He is oriented to person, place, and time. He appears well-developed and well-nourished.  Non-toxic appearance. No distress.  HENT:  Head: Normocephalic and atraumatic.  Eyes: Conjunctivae, EOM and lids are normal. Pupils are equal, round, and reactive to light.  Neck: Normal range of motion. Neck supple. No tracheal deviation present. No mass present.  Cardiovascular: Regular rhythm and normal heart sounds.  Tachycardia present.  Exam reveals no gallop.   No murmur heard. Pulmonary/Chest: Effort normal and breath sounds normal. No stridor. No respiratory distress. He has no decreased breath sounds. He has no wheezes. He has no rhonchi. He has no rales.  Abdominal: Soft. Normal appearance and bowel sounds are normal. He exhibits no distension. There is no tenderness. There is no rebound and no CVA tenderness.  Musculoskeletal: Normal range of motion. He exhibits no edema and no tenderness.       Legs: Neurological: He is alert and oriented to person, place, and time. He has normal strength. No cranial nerve deficit or sensory deficit. GCS eye subscore is 4. GCS verbal subscore is 5. GCS motor subscore is 6.  Skin: Skin is warm and dry. No abrasion and no rash noted.  Psychiatric: He has a normal mood and affect. His speech is normal and behavior is normal.    ED Course  Procedures (including critical care time) Labs Review Labs Reviewed  CBC  BASIC METABOLIC PANEL   Imaging Review No results found.   EKG Interpretation None      MDM   Final diagnoses:  None    Patient started on vancomycin and will be admitted to the medicine service    Leota Jacobsen, MD 04/20/13 [phone]

## 2013-04-20 NOTE — ED Notes (Signed)
The pt is here with bi-lateral leg pain for one week.   Rt leg is worse.  The rt leg is red swollen and he has open lesions on that leg that are draining.  Difficulty walking

## 2013-04-20 NOTE — Progress Notes (Signed)
ANTIBIOTIC CONSULT NOTE - INITIAL  Pharmacy Consult for vancomycin Indication: cellulitis  Allergies  Allergen Reactions  . Lipitor [Atorvastatin Calcium] Other (See Comments)    Myalgias "don't remember how bad" (01/31/2012)  . Penicillins Other (See Comments)    Passed out    Patient Measurements: Weight: 56.5 kg  Vital Signs: Temp: 98 F (36.7 C) (03/07 2010) Temp src: Oral (03/07 2010) BP: 149/71 mmHg (03/07 2000) Pulse Rate: 100 (03/07 2000) Labs:  Recent Labs  04/20/13 1750  WBC 14.6*  HGB 15.6  PLT 127*  CREATININE 1.23     Microbiology: No results found for this or any previous visit (from the past 720 hour(s)).  Medical History: Past Medical History  Diagnosis Date  . Hyperlipidemia   . History of colon polyps   . Spinal stenosis   . Anxiety   . BPH (benign prostatic hypertrophy)   . ED (erectile dysfunction)   . Osteoporosis   . Hypertension   . Chest pain     "I've had it" (01/31/2012)   . Peripheral vascular disease     "right leg is 100% blocked; left leg has stent, still ~ 75% blocked" (01/31/2012)  . Peripheral neuropathy     "bad" (01/31/2012)  . COPD (chronic obstructive pulmonary disease)   . Emphysema   . Pneumonia 1960's    "double" (01/31/2012)  . BRBPR (bright red blood per rectum)     "don't know what it's from; had some this week" (01/31/2012)  . Cervical spondylosis   . Rheumatoid arthritis(714.0)   . Arthritis     "hands and feet" (01/31/2012)  . DDD (degenerative disc disease), lumbosacral     "S1; L5" (01/31/2012)  . Chronic lower back pain   . Depression     "imagine I've got a mild depression w/what I've gone thru last month" (01/31/2012)  . Prostate cancer 2004    "had 40 treatments of radiation" (01/31/2012)  . Breast cancer in male     "left" (01/31/2012)  . Basal cell carcinoma of nasal tip     "  . Pernicious anemia   . GERD (gastroesophageal reflux disease)   . Polyneuropathy in other diseases classified  elsewhere 11/06/2012  . Lumbosacral spondylosis without myelopathy 11/06/2012    Medications:  See med list  Assessment: 79 year old male with R lower extremity swelling and erythema x 1 week.  Goal of Therapy:  Vancomycin trough level 10-15 mcg/ml  Plan:  Vancomycin 750 mg IV q24h VT as needed Follow clinical course  Hughes Better, PharmD, BCPS Clinical Pharmacist Pager: [phone] 04/20/2013 8:31 PM

## 2013-04-20 NOTE — H&P (Signed)
[facility] History and Physical  [name] [id number] DOB: 1934/08/23 DOA: 04/20/2013  Referring physician: EDP PCP: Simona Huh, MD   Chief Complaint: RLE cellulitis   HPI: [name] is a 79 y.o. male who presents to the ED with RLE swelling and erythema x 1 week.  Progressively worsening.  Worse with movement.  There are ulcers present around his ankle.  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  Review of Systems: Systems reviewed.  As above, otherwise negative  Past Medical History  Diagnosis Date  . Hyperlipidemia   . History of colon polyps   . Spinal stenosis   . Anxiety   . BPH (benign prostatic hypertrophy)   . ED (erectile dysfunction)   . Osteoporosis   . Hypertension   . Chest pain     "I've had it" (01/31/2012)   . Peripheral vascular disease     "right leg is 100% blocked; left leg has stent, still ~ 75% blocked" (01/31/2012)  . Peripheral neuropathy     "bad" (01/31/2012)  . COPD (chronic obstructive pulmonary disease)   . Emphysema   . Pneumonia 1960's    "double" (01/31/2012)  . BRBPR (bright red blood per rectum)     "don't know what it's from; had some this week" (01/31/2012)  . Cervical spondylosis   . Rheumatoid arthritis(714.0)   . Arthritis     "hands and feet" (01/31/2012)  . DDD (degenerative disc disease), lumbosacral     "S1; L5" (01/31/2012)  . Chronic lower back pain   . Depression     "imagine I've got a mild depression w/what I've gone thru last month" (01/31/2012)  . Prostate cancer 2004    "had 40 treatments of radiation" (01/31/2012)  . Breast cancer in male     "left" (01/31/2012)  . Basal cell carcinoma of nasal tip     "  . Pernicious anemia   . GERD (gastroesophageal reflux disease)   . Polyneuropathy in other diseases classified elsewhere 11/06/2012  . Lumbosacral spondylosis without myelopathy  11/06/2012   Past Surgical History  Procedure Laterality Date  . Basal cell carcinoma excision      Nose x 3  . Cholecystectomy  ?1990's  . Wrist fusion      Right Wrist; "3 OR's; it's fused" (01/31/2012)  . Excisional hemorrhoidectomy  1960's  . Knee surgery      "right; X 6 surgeries; went in for simple cartilage OR; ended up w/fused knee" (01/31/2012)  . Iliac artery stent  12-07-09    Stent done by Dr. Gwenlyn Found  . Total hip arthroplasty  08/16/2011    Procedure: TOTAL HIP ARTHROPLASTY ANTERIOR APPROACH;  Surgeon: Mcarthur Rossetti, MD;  Location: [place];  Service: Orthopedics;  Laterality: Right;  Right total hip replacement  . Cervical disc surgery      "7 total; ended w/a fusion" (01/31/2012)  . Esophagogastroduodenoscopy  02/22/2012    Procedure: ESOPHAGOGASTRODUODENOSCOPY (EGD);  Surgeon: Arta Silence, MD;  Location: [facility];  Service: Endoscopy;  Laterality: N/A;  . Eus  02/22/2012    Procedure: UPPER ENDOSCOPIC ULTRASOUND (EUS) RADIAL;  Surgeon: Arta Silence, MD;  Location: [facility];  Service: Endoscopy;  Laterality: N/A;  . Hernia repair    . Transurethral resection of prostate     Social History:  reports that he quit smoking about 14 months ago. His smoking use  included Cigarettes. He has a 12 pack-year smoking history. He has never used smokeless tobacco. He reports that he does not drink alcohol or use illicit drugs.  Allergies  Allergen Reactions  . Lipitor [Atorvastatin Calcium] Other (See Comments)    Myalgias "don't remember how bad" (01/31/2012)  . Penicillins Other (See Comments)    Passed out    Family History  Problem Relation Age of Onset  . Hypertension Mother   . Cancer Mother     colon cancer  . Stroke Mother   . Aneurysm Father     abdominal aortic  . Heart disease Brother      Prior to Admission medications   Medication Sig Start Date End Date Taking? Authorizing Provider  albuterol (PROVENTIL) (2.5 MG/3ML) 0.083% nebulizer solution  Take 2.5 mg by nebulization every 4 (four) hours as needed for wheezing or shortness of breath.   Yes Historical Provider, MD  ALPRAZolam Duanne Moron) 1 MG tablet Take 1 mg by mouth at bedtime.    Yes Historical Provider, MD  diazepam (VALIUM) 5 MG tablet One tablet twice during the day and two tablets at night 01/29/13  Yes Kathrynn Ducking, MD  Fluticasone-Salmeterol (ADVAIR) 250-50 MCG/DOSE AEPB Inhale 1 puff into the lungs every 12 (twelve) hours. 02/22/13  Yes Tanda Rockers, MD  hydrOXYzine (ATARAX/VISTARIL) 25 MG tablet Take 25 mg by mouth 2 (two) times daily as needed for anxiety.   Yes Historical Provider, MD  oxycodone (ROXICODONE) 30 MG immediate release tablet Take 1 tablet (30 mg total) by mouth every 3 (three) hours as needed. MUST LAST 28 DAYS 04/15/13  Yes Kathrynn Ducking, MD  prednisoLONE 5 MG TABS tablet Take 5 mg by mouth daily.   Yes Historical Provider, MD  rosuvastatin (CRESTOR) 20 MG tablet Take 20 mg by mouth at bedtime.    Yes Historical Provider, MD  silver sulfADIAZINE (SILVADENE) 1 % cream Apply topically daily. 11/15/12  Yes Malvin Johns, MD  tiotropium (SPIRIVA) 18 MCG inhalation capsule Place 1 capsule (18 mcg total) into inhaler and inhale daily. 02/22/13  Yes Tanda Rockers, MD  traZODone (DESYREL) 50 MG tablet Take 50 mg by mouth at bedtime.   Yes Historical Provider, MD  sertraline (ZOLOFT) 50 MG tablet Take 1 tablet (50 mg total) by mouth daily. 06/25/12   Brand Males, MD   Physical Exam: Filed Vitals:   04/20/13 1900  BP: 157/66  Pulse: 106  Temp:   Resp: 25    BP 157/66  Pulse 106  Temp(Src) 99.4 F (37.4 C) (Oral)  Resp 25  SpO2 91%  General Appearance:    Alert, oriented, no distress, appears stated age  Head:    Normocephalic, atraumatic  Eyes:    PERRL, EOMI, sclera non-icteric        Nose:   Nares without drainage or epistaxis. Mucosa, turbinates normal  Throat:   Moist mucous membranes. Oropharynx without erythema or exudate.  Neck:   Supple.  No carotid bruits.  No thyromegaly.  No lymphadenopathy.   Back:     No CVA tenderness, no spinal tenderness  Lungs:     Clear to auscultation bilaterally, without wheezes, rhonchi or rales  Chest wall:    No tenderness to palpitation  Heart:    Regular rate and rhythm without murmurs, gallops, rubs  Abdomen:     Soft, non-tender, nondistended, normal bowel sounds, no organomegaly  Genitalia:    deferred  Rectal:    deferred  Extremities:  No clubbing, cyanosis or edema.  Pulses:   2+ and symmetric all extremities Absent in RLE (not new finding, had absent femoral pulse even back in 2013 during Dr. Luther Parody evaluation)  Skin:   RLE lower leg cellulitis and ankle ulcer.  Lymph nodes:   Cervical, supraclavicular, and axillary nodes normal  Neurologic:   CNII-XII intact. Normal strength, sensation and reflexes      throughout    Labs on Admission:  Basic Metabolic Panel:  Recent Labs Lab 04/20/13 1750  NA 132*  K 3.9  CL 88*  CO2 27  GLUCOSE 151*  BUN 26*  CREATININE 1.23  CALCIUM 9.9   Liver Function Tests: No results found for this basename: AST, ALT, ALKPHOS, BILITOT, PROT, ALBUMIN,  in the last 168 hours No results found for this basename: LIPASE, AMYLASE,  in the last 168 hours No results found for this basename: AMMONIA,  in the last 168 hours CBC:  Recent Labs Lab 04/20/13 1750  WBC 14.6*  HGB 15.6  HCT 42.9  MCV 94.7  PLT 127*   Cardiac Enzymes: No results found for this basename: CKTOTAL, CKMB, CKMBINDEX, TROPONINI,  in the last 168 hours  BNP (last 3 results) No results found for this basename: PROBNP,  in the last 8760 hours CBG: No results found for this basename: GLUCAP,  in the last 168 hours  Radiological Exams on Admission: No results found.  EKG: Independently reviewed.  Assessment/Plan Active Problems:   Chronic total occlusion of artery of the extremities   Cellulitis   Sepsis   1. Cellulitis and sepsis - on IVF, vancomycin, repeat  labs in AM. 2. 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 Donnetta Hutching.  Likely will need vascular surgery consult to see if they have anything to offer.    Code Status: Full Code  Family Communication: No family in room Disposition Plan: Admit to inpatient   Time spent: 70 min  Kahlin Mark M. [facility] Pager [phone]  If 7AM-7PM, please contact the day team taking care of the patient [email or web address] Password [email or web address] 04/20/2013, 8:02 PM

## 2013-04-21 DIAGNOSIS — J449 Chronic obstructive pulmonary disease, unspecified: Secondary | ICD-10-CM

## 2013-04-21 DIAGNOSIS — F3289 Other specified depressive episodes: Secondary | ICD-10-CM

## 2013-04-21 DIAGNOSIS — F329 Major depressive disorder, single episode, unspecified: Secondary | ICD-10-CM

## 2013-04-21 LAB — BASIC METABOLIC PANEL
BUN: 27 mg/dL — AB (ref 6–23)
CO2: 25 mEq/L (ref 19–32)
Calcium: 9 mg/dL (ref 8.4–10.5)
Chloride: 95 mEq/L — ABNORMAL LOW (ref 96–112)
Creatinine, Ser: 1.26 mg/dL (ref 0.50–1.35)
GFR calc Af Amer: 62 mL/min — ABNORMAL LOW (ref >90)
GFR, EST NON AFRICAN AMERICAN: 54 mL/min — AB (ref >90)
Glucose, Bld: 94 mg/dL (ref 70–99)
POTASSIUM: 4 meq/L (ref 3.7–5.3)
Sodium: 137 mEq/L (ref 137–147)

## 2013-04-21 LAB — CBC
HEMATOCRIT: 38.4 % — AB (ref 39.0–52.0)
Hemoglobin: 13.5 g/dL (ref 13.0–17.0)
MCH: 33.3 pg (ref 26.0–34.0)
MCHC: 35.2 g/dL (ref 30.0–36.0)
MCV: 94.8 fL (ref 78.0–100.0)
PLATELETS: 98 10*3/uL — AB (ref 150–400)
RBC: 4.05 MIL/uL — ABNORMAL LOW (ref 4.22–5.81)
RDW: 13.8 % (ref 11.5–15.5)
WBC: 12.2 10*3/uL — ABNORMAL HIGH (ref 4.0–10.5)

## 2013-04-21 LAB — HEPATIC FUNCTION PANEL
ALT: 10 U/L (ref 0–53)
AST: 44 U/L — ABNORMAL HIGH (ref 0–37)
Albumin: 2.5 g/dL — ABNORMAL LOW (ref 3.5–5.2)
Alkaline Phosphatase: 79 U/L (ref 39–117)
BILIRUBIN DIRECT: 0.6 mg/dL — AB (ref 0.0–0.3)
BILIRUBIN INDIRECT: 0.6 mg/dL (ref 0.3–0.9)
Total Bilirubin: 1.2 mg/dL (ref 0.3–1.2)
Total Protein: 5.9 g/dL — ABNORMAL LOW (ref 6.0–8.3)

## 2013-04-21 NOTE — Progress Notes (Signed)
[facility] PROGRESS NOTE  [name] TIR:[id number] DOB: May 14, 1934 DOA: 04/20/2013 PCP: Simona Huh, MD  Assessment/Plan: 1.Cellulitis with draining L. 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 Leg pain, and still draining  Objective: Filed Vitals:   04/21/13 1321  BP: 117/50  Pulse: 90  Temp: 99.3 F (37.4 C)  Resp:    No intake or output data in the 24 hours ending 04/21/13 1843 Filed Weights   04/20/13 2012  Weight: 56.5 kg (124 lb 9 oz)    Exam:  General: alert & oriented x 3 In NAD Cardiovascular: RRR, nl S1 s2 Respiratory: CTAB Abdomen: soft +BS NT/ND, no masses palpable Extremities: R. Lower leg +edema, +erythema and open small wound draining purulent discharge above lat. Malleolus and long bullous lesion medially on lower leg, markedly tender to palpation.     Data Reviewed: Basic Metabolic Panel:  Recent Labs Lab 04/20/13 1750 04/21/13 0615  NA 132* 137  K 3.9 4.0  CL 88* 95*  CO2 27 25  GLUCOSE 151* 94  BUN 26* 27*  CREATININE 1.23 1.26  CALCIUM 9.9 9.0   Liver Function Tests:  Recent Labs Lab 04/21/13 0615  AST 44*  ALT 10  ALKPHOS 79  BILITOT 1.2  PROT 5.9*  ALBUMIN 2.5*   No results found for this basename: LIPASE, AMYLASE,  in the last 168 hours No results found for this basename: AMMONIA,  in the last 168 hours CBC:  Recent Labs Lab 04/20/13 1750 04/21/13 0615  WBC 14.6* 12.2*  HGB 15.6 13.5  HCT 42.9 38.4*  MCV 94.7 94.8  PLT 127* 98*   Cardiac Enzymes: No results found for  this basename: CKTOTAL, CKMB, CKMBINDEX, TROPONINI,  in the last 168 hours BNP (last 3 results) No results found for this basename: PROBNP,  in the last 8760 hours CBG: No results found for this basename: GLUCAP,  in the last 168 hours  No results found for this or any previous visit (from the past 240 hour(s)).   Studies: No results found.  Scheduled Meds: . ALPRAZolam  1 mg Oral QHS  . diazepam  10 mg Oral q1800  . diazepam  5 mg Oral Daily  . heparin  5,000 Units Subcutaneous 3 times per day  . mometasone-formoterol  2 puff Inhalation BID  . prednisoLONE  5 mg Oral Q breakfast  . rosuvastatin  20 mg Oral q1800  . sertraline  50 mg Oral Daily  . tiotropium  18 mcg Inhalation Daily  . traZODone  50 mg Oral QHS  . vancomycin  750 mg Intravenous Q24H   Continuous Infusions: . sodium chloride 75 mL/hr at 04/21/13 1412    Active Problems:   Chronic total occlusion of artery of the extremities   Cellulitis   Sepsis    Time spent: [facility] Pager [phone]. If 7PM-7AM, please contact night-coverage at [email or web address], password [facility] 04/21/2013, 6:43 PM  LOS: 1 day

## 2013-04-22 DIAGNOSIS — F411 Generalized anxiety disorder: Secondary | ICD-10-CM

## 2013-04-22 DIAGNOSIS — I739 Peripheral vascular disease, unspecified: Secondary | ICD-10-CM

## 2013-04-22 DIAGNOSIS — L98499 Non-pressure chronic ulcer of skin of other sites with unspecified severity: Secondary | ICD-10-CM

## 2013-04-22 MED ORDER — OXYCODONE HCL 5 MG PO TABS
5.0000 mg | ORAL_TABLET | ORAL | Status: DC | PRN
  Administered 2013-04-24 – 2013-04-27 (×6): 5 mg via ORAL
  Filled 2013-04-22 (×7): qty 1

## 2013-04-22 MED ORDER — HYDROMORPHONE HCL PF 1 MG/ML IJ SOLN
1.0000 mg | INTRAMUSCULAR | Status: DC | PRN
  Administered 2013-04-24 – 2013-04-27 (×12): 1 mg via INTRAVENOUS
  Administered 2013-04-28 (×4): 2 mg via INTRAVENOUS
  Administered 2013-04-28: 1 mg via INTRAVENOUS
  Administered 2013-04-29: 2 mg via INTRAVENOUS
  Administered 2013-04-29: 1 mg via INTRAVENOUS
  Administered 2013-04-29 – 2013-04-30 (×7): 2 mg via INTRAVENOUS
  Administered 2013-05-01 (×2): 1 mg via INTRAVENOUS
  Administered 2013-05-01 (×2): 2 mg via INTRAVENOUS
  Administered 2013-05-02 (×5): 1 mg via INTRAVENOUS
  Administered 2013-05-02: 2 mg via INTRAVENOUS
  Administered 2013-05-03: 1 mg via INTRAVENOUS
  Administered 2013-05-03: 2 mg via INTRAVENOUS
  Filled 2013-04-22 (×4): qty 2
  Filled 2013-04-22: qty 1
  Filled 2013-04-22: qty 2
  Filled 2013-04-22: qty 1
  Filled 2013-04-22: qty 2
  Filled 2013-04-22 (×2): qty 1
  Filled 2013-04-22 (×2): qty 2
  Filled 2013-04-22 (×2): qty 1
  Filled 2013-04-22: qty 2
  Filled 2013-04-22 (×9): qty 1
  Filled 2013-04-22 (×3): qty 2
  Filled 2013-04-22: qty 1
  Filled 2013-04-22: qty 2
  Filled 2013-04-22: qty 1
  Filled 2013-04-22: qty 2
  Filled 2013-04-22: qty 1
  Filled 2013-04-22 (×6): qty 2
  Filled 2013-04-22: qty 1
  Filled 2013-04-22: qty 2

## 2013-04-22 NOTE — Progress Notes (Signed)
Utilization review completed.  

## 2013-04-22 NOTE — Consult Note (Signed)
I have examined the patient, reviewed and agree with above. Patient seen by me in the office 2 years ago. At that time he had pain from the top of his head to the bottom of his feet of unclear etiology with chronic back issues and chronic pain issues. He has a long history of peripheral vascular occlusive disease. He is status post left iliac angioplasty number of years ago with good durable result. He has known chronic occlusion of his right iliac system. He had not been having any symptoms related to this. He is now related with unusual picture of cellulitis and ulceration over his entire calf. He has a normal 2+ left dorsalis pedis pulse left femoral pulse. I do not palpate a right femoral pulse. He does have very good Doppler flow at the anterior tibial posterior tibial and peroneal arteries at the foot. Would suspect that this would allow healing with this level of flow. Concern regarding the nonhealing ulcer. Agree with current antibiotic treatment. Will need arteriogram when infection is resolving to determine if he is a candidate for extra-anatomic bypass. Would require femoral to femoral bypass for flow to the right leg. Will follow with you.  Arita Severtson, MD 04/22/2013 1:09 PM

## 2013-04-22 NOTE — Progress Notes (Signed)
[facility] PROGRESS NOTE  [name] YBO:[id number] DOB: 1934/05/16 DOA: 04/20/2013 PCP: Simona Huh, MD  Assessment/Plan: 1.Cellulitis with draining L. Leg wound and sepsis - -continue vanc, blood cultures to date with no growth -Appreciate Dr. 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 Hermina Barters >> patient will need arteriogram when infection is resolved to determine if his a candidate for extra-anatomic bypass   Code Status: full Family Communication: none at bedisde Disposition Plan: pending clinical course   Consultants:  Vascular surgery- Dr Donnetta Hutching to see  Procedures:  none  Antibiotics:  vanc started on 3/7   HPI/Subjective: C/o increased L. Leg pain, and still draining  Objective: Filed Vitals:   04/22/13 0531  BP: 128/61  Pulse: 89  Temp: 99.1 F (37.3 C)  Resp:     Intake/Output Summary (Last 24 hours) at 04/22/13 1359 Last data filed at 04/21/13 2300  Gross per 24 hour  Intake    960 ml  Output      0 ml  Net    960 ml   Filed Weights   04/20/13 2012  Weight: 56.5 kg (124 lb 9 oz)    Exam:  General: alert & oriented x 3 In NAD Cardiovascular: RRR, nl S1 s2 Respiratory: CTAB Abdomen: soft +BS NT/ND, no masses palpable Extremities: R. Lower leg decreasing edema, +erythema and open small wound with decreasing purulent drainage above lat. Malleolus and calf area medially now with ulceration, markedly tender to palpation.     Data Reviewed: Basic Metabolic Panel:  Recent Labs Lab 04/20/13 1750 04/21/13 0615  NA 132* 137  K 3.9 4.0  CL 88* 95*  CO2 27 25  GLUCOSE 151* 94  BUN 26* 27*  CREATININE 1.23 1.26  CALCIUM 9.9 9.0   Liver Function Tests:  Recent Labs Lab 04/21/13 0615  AST 44*  ALT 10  ALKPHOS 79  BILITOT 1.2  PROT 5.9*  ALBUMIN 2.5*   No results found for this  basename: LIPASE, AMYLASE,  in the last 168 hours No results found for this basename: AMMONIA,  in the last 168 hours CBC:  Recent Labs Lab 04/20/13 1750 04/21/13 0615  WBC 14.6* 12.2*  HGB 15.6 13.5  HCT 42.9 38.4*  MCV 94.7 94.8  PLT 127* 98*   Cardiac Enzymes: No results found for this basename: CKTOTAL, CKMB, CKMBINDEX, TROPONINI,  in the last 168 hours BNP (last 3 results) No results found for this basename: PROBNP,  in the last 8760 hours CBG: No results found for this basename: GLUCAP,  in the last 168 hours  Recent Results (from the past 240 hour(s))  CULTURE, BLOOD (ROUTINE X 2)     Status: None   Collection Time    04/20/13  9:15 PM      Result Value Ref Range Status   Specimen Description BLOOD RIGHT HAND   Final   Special Requests BOTTLES DRAWN AEROBIC ONLY 4CC   Final   Culture  Setup Time     Final   Value: 04/21/2013 13:08     Performed at [facility]   Culture     Final   Value:        BLOOD CULTURE RECEIVED NO GROWTH TO DATE CULTURE WILL BE HELD FOR 5 DAYS BEFORE ISSUING A FINAL NEGATIVE REPORT     Performed at [facility]   Report Status PENDING   Incomplete  CULTURE, BLOOD (ROUTINE X 2)     Status: None   Collection Time    04/20/13  9:26 PM      Result Value Ref Range Status   Specimen Description BLOOD LEFT HAND   Final   Special Requests BOTTLES DRAWN AEROBIC ONLY 3CC   Final   Culture  Setup Time     Final   Value: 04/21/2013 13:08     Performed at [facility]   Culture     Final   Value:        BLOOD CULTURE RECEIVED NO GROWTH TO DATE CULTURE WILL BE HELD FOR 5 DAYS BEFORE ISSUING A FINAL NEGATIVE REPORT     Performed at [facility]   Report Status PENDING   Incomplete     Studies: No results found.  Scheduled Meds: . ALPRAZolam  1 mg Oral QHS  . diazepam  10 mg Oral q1800  . diazepam  5 mg Oral Daily  . heparin  5,000 Units Subcutaneous 3 times per day  . mometasone-formoterol  2 puff Inhalation BID   . prednisoLONE  5 mg Oral Q breakfast  . rosuvastatin  20 mg Oral q1800  . sertraline  50 mg Oral Daily  . tiotropium  18 mcg Inhalation Daily  . traZODone  50 mg Oral QHS  . vancomycin  750 mg Intravenous Q24H   Continuous Infusions: . sodium chloride 75 mL/hr (04/22/13 0549)    Active Problems:   Chronic total occlusion of artery of the extremities   Cellulitis   Sepsis    Time spent: [facility] Pager [phone]. If 7PM-7AM, please contact night-coverage at [email or web address], password [facility] 04/22/2013, 1:59 PM  LOS: 2 days

## 2013-04-22 NOTE — Consult Note (Signed)
[facility] NOTE   MRN : [id number]  Reason for Consult: Right leg ulcers with known iliac occlusion Referring Physician: Etta Quill, DO   History of Present Illness: 79 y/o male with chief complaint of right leg redness, sores and pain.  He states the redness appeared 1 week ago.  He has had no new injury to his right leg.  The lateral ulcer has been present for a long time.  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  He is being treated for sepsis and is on Vancomycin.       Current Facility-Administered Medications  Medication Dose Route Frequency Provider Last Rate Last Dose  . 0.9 %  sodium chloride infusion   Intravenous Continuous Etta Quill, DO 75 mL/hr at 04/22/13 0549 75 mL/hr at 04/22/13 0549  . acetaminophen (TYLENOL) suppository 650 mg  650 mg Rectal Q6H PRN Dianne Dun, NP   650 mg at 04/20/13 2302  . albuterol (PROVENTIL) (2.5 MG/3ML) 0.083% nebulizer solution 2.5 mg  2.5 mg Nebulization Q4H PRN Etta Quill, DO      . ALPRAZolam Duanne Moron) tablet 1 mg  1 mg Oral QHS Etta Quill, DO   1 mg at 04/21/13 2305  . diazepam (VALIUM) tablet 10 mg  10 mg Oral q1800 Etta Quill, DO   10 mg at 04/21/13 8182  . diazepam (VALIUM) tablet 5 mg  5 mg Oral Daily Etta Quill, DO   5 mg at 04/21/13 1009  . heparin injection 5,000 Units  5,000 Units Subcutaneous 3 times per day Etta Quill, DO   5,000 Units at 04/22/13 9937  . HYDROmorphone (DILAUDID) injection 1 mg  1 mg Intravenous Q4H PRN Etta Quill, DO   1 mg at 04/22/13 0543  . hydrOXYzine (ATARAX/VISTARIL) tablet 25 mg  25 mg Oral BID PRN Etta Quill, DO      . mometasone-formoterol (DULERA) 100-5 MCG/ACT inhaler 2 puff  2 puff Inhalation BID Etta Quill, DO   2 puff at 04/22/13 1008  . prednisoLONE tablet 5 mg  5 mg Oral Q breakfast Etta Quill, DO   5 mg at 04/22/13 0815  . rosuvastatin (CRESTOR) tablet 20 mg  20 mg Oral q1800 Etta Quill, DO   20 mg at 04/21/13 2304  . sertraline (ZOLOFT) tablet 50 mg  50 mg Oral Daily Etta Quill, DO   50 mg at 04/21/13 1009  . tiotropium (SPIRIVA) inhalation capsule 18 mcg  18 mcg Inhalation Daily Etta Quill, DO   18 mcg at 04/22/13 1008  . traZODone (DESYREL) tablet 50 mg  50 mg Oral QHS Etta Quill, DO   50 mg at 04/20/13 2319  . vancomycin (VANCOCIN) IVPB 750 mg/150 ml premix  750 mg Intravenous Q24H Etta Quill, DO   750 mg at 04/21/13 1855    Pt meds include: Statin :Yes Betablocker: No ASA: No Other anticoagulants/antiplatelets: none  Past Medical History  Diagnosis Date  . Hyperlipidemia   . History of colon polyps   . Spinal stenosis   . Anxiety   . BPH (benign prostatic hypertrophy)   . ED (erectile dysfunction)   . Osteoporosis   . Hypertension   . Chest pain     "I've had it" (01/31/2012)   . Peripheral vascular disease     "right leg is 100% blocked; left leg has stent, still ~ 75% blocked" (01/31/2012)  .  Peripheral neuropathy     "bad" (01/31/2012)  . COPD (chronic obstructive pulmonary disease)   . Emphysema   . Pneumonia 1960's    "double" (01/31/2012)  . BRBPR (bright red blood per rectum)     "don't know what it's from; had some this week" (01/31/2012)  . Cervical spondylosis   . Rheumatoid arthritis(714.0)   . Arthritis     "hands and feet" (01/31/2012)  . DDD (degenerative disc disease), lumbosacral     "S1; L5" (01/31/2012)  . Chronic lower back pain   . Depression     "imagine I've got a mild depression w/what I've gone thru last month" (01/31/2012)  . Prostate cancer 2004    "had 40 treatments of radiation" (01/31/2012)  . Breast cancer in male     "left" (01/31/2012)  . Basal cell carcinoma of nasal tip     "  . Pernicious anemia   . GERD (gastroesophageal reflux disease)   . Polyneuropathy in other diseases classified elsewhere 11/06/2012  . Lumbosacral spondylosis without myelopathy 11/06/2012    Past Surgical  History  Procedure Laterality Date  . Basal cell carcinoma excision      Nose x 3  . Cholecystectomy  ?1990's  . Wrist fusion      Right Wrist; "3 OR's; it's fused" (01/31/2012)  . Excisional hemorrhoidectomy  1960's  . Knee surgery      "right; X 6 surgeries; went in for simple cartilage OR; ended up w/fused knee" (01/31/2012)  . Iliac artery stent  12-07-09    Stent done by Dr. Gwenlyn Found  . Total hip arthroplasty  08/16/2011    Procedure: TOTAL HIP ARTHROPLASTY ANTERIOR APPROACH;  Surgeon: Mcarthur Rossetti, MD;  Location: [facility];  Service: Orthopedics;  Laterality: Right;  Right total hip replacement  . Cervical disc surgery      "7 total; ended w/a fusion" (01/31/2012)  . Esophagogastroduodenoscopy  02/22/2012    Procedure: ESOPHAGOGASTRODUODENOSCOPY (EGD);  Surgeon: Arta Silence, MD;  Location: [facility];  Service: Endoscopy;  Laterality: N/A;  . Eus  02/22/2012    Procedure: UPPER ENDOSCOPIC ULTRASOUND (EUS) RADIAL;  Surgeon: Arta Silence, MD;  Location: [facility];  Service: Endoscopy;  Laterality: N/A;  . Hernia repair    . Transurethral resection of prostate      Social History History  Substance Use Topics  . Smoking status: Former Smoker -- 0.20 packs/day for 60 years    Types: Cigarettes    Quit date: 01/31/2012  . Smokeless tobacco: Never Used  . Alcohol Use: No     Comment: 01/31/2012 "heavy drinker 35 years ago; don't drink anymore at all now; had a beer ~ 10 yr ago"    Family History Family History  Problem Relation Age of Onset  . Hypertension Mother   . Cancer Mother     colon cancer  . Stroke Mother   . Aneurysm Father     abdominal aortic  . Heart disease Brother     Allergies  Allergen Reactions  . Lipitor [Atorvastatin Calcium] Other (See Comments)    Myalgias "don't remember how bad" (01/31/2012)  . Penicillins Other (See Comments)    Passed out     REVIEW OF SYSTEMS  General: [ ]  Weight loss, [ ]  Fever, [ ]  chills Neurologic: [ ]   Dizziness, [ ]  Blackouts, [ ]  Seizure [ ]  Stroke, [ ]  "Mini stroke", [ ]  Slurred speech, [ ]  Temporary blindness; [ ]  weakness in arms or legs, [ ]  Hoarseness [ ]   Dysphagia Cardiac: [ ]  Chest pain/pressure, [ ]  Shortness of breath at rest [ ]  Shortness of breath with exertion, [ ]  Atrial fibrillation or irregular heartbeat  Vascular: [ ]  Pain in legs with walking, [ ]  Pain in legs at rest, [ ]  Pain in legs at night,  [ ]  Non-healing ulcer, [ ]  Blood clot in vein/DVT,   Pulmonary: [ ]  Home oxygen, [ ]  Productive cough, [ ]  Coughing up blood, [ ]  Asthma,  [ ]  Wheezing [ ]  COPD Musculoskeletal:  [ ]  Arthritis, [ ]  Low back pain, [ ]  Joint pain Hematologic: [ ]  Easy Bruising, [ ]  Anemia; [ ]  Hepatitis Gastrointestinal: [ ]  Blood in stool, [ ]  Gastroesophageal Reflux/heartburn, Urinary: [ ]  chronic Kidney disease, [ ]  on HD - [ ]  MWF or [ ]  TTHS, [ ]  Burning with urination, [ ]  Difficulty urinating Skin: [ ]  Rashes, [ ]  Wounds Psychological: [ ]  Anxiety, [ ]  Depression  Physical Examination Filed Vitals:   04/21/13 1321 04/21/13 2101 04/22/13 0531 04/22/13 1009  BP: 117/50 110/56 128/61   Pulse: 90 100 89   Temp: 99.3 F (37.4 C) 98.5 F (36.9 C) 99.1 F (37.3 C)   TempSrc: Oral Oral Rectal   Resp:      Height:      Weight:      SpO2: 92% 94% 93% 95%   Body mass index is 18.45 kg/(m^2).  General:  WDWN in NAD Gait: uses walker at home and does not bear weight on the right  HENT: WNL Eyes: Pupils equal Pulmonary: normal non-labored breathing , without Rales, rhonchi,  wheezing Cardiac: RRR, without  Murmurs, rubs or gallops; No carotid bruits Abdomen: soft, NT, no masses Skin:Right LE rubar with blister formation on the posterior leg below the knee.  Lateral full thickness ulcer no active drainage.  No heel ulcer, but very tender to palpation.  Vascular Exam/Pulses:Doppler DP/PT 2+, Femoral left > right palpable.    Musculoskeletal: no muscle wasting or atrophy; no edema on the  left.     Positive ankle contracture plantar flexion on the right, right knee fusion, well healed scars on the right knee.    Neurologic: A&O X 3; Appropriate Affect ;  SENSATION: normal; MOTOR FUNCTION: 5/5 Symmetric Speech is fluent/normal   Significant Diagnostic Studies: CBC Lab Results  Component Value Date   WBC 12.2* 04/21/2013   HGB 13.5 04/21/2013   HCT 38.4* 04/21/2013   MCV 94.8 04/21/2013   PLT 98* 04/21/2013    BMET    Component Value Date/Time   NA 137 04/21/2013 0615   K 4.0 04/21/2013 0615   CL 95* 04/21/2013 0615   CO2 25 04/21/2013 0615   GLUCOSE 94 04/21/2013 0615   BUN 27* 04/21/2013 0615   CREATININE 1.26 04/21/2013 0615   CALCIUM 9.0 04/21/2013 0615   GFRNONAA 54* 04/21/2013 0615   GFRAA 62* 04/21/2013 0615   Estimated Creatinine Clearance: 39.9 ml/min (by C-G formula based on Cr of 1.26).  COAG Lab Results  Component Value Date   INR 0.97 01/31/2012   INR 2.70* 08/18/2011   INR 5.77* 08/18/2011     Non-Invasive Vascular Imaging: ABI pending  ASSESSMENT/PLAN:   Chronic right lower extremity ulcer, with increased pain and erythema cellulitis  ABI ordered. He has chronic occluded right iliac artery.  He may need a angiogram and possible intervention stent verses by pass.  We will follow along.   Laurence [facility] 04/22/2013 10:56 AM

## 2013-04-22 NOTE — Consult Note (Addendum)
WOC wound consult note Reason for Consult: WOC consult requested for RLE wounds.  Pt is reported to have "100%  occlusion to right leg, according to notes from 2013."  Primary team has requested VVS consult. Please defer to their recommendations regarding topical treatment and re-consult if further assistance is needed.  Thank-you,  Julien Girt MSN, [place], [place], [place], [place]

## 2013-04-23 LAB — CBC
HCT: 37.4 % — ABNORMAL LOW (ref 39.0–52.0)
Hemoglobin: 13.3 g/dL (ref 13.0–17.0)
MCH: 33.3 pg (ref 26.0–34.0)
MCHC: 35.6 g/dL (ref 30.0–36.0)
MCV: 93.5 fL (ref 78.0–100.0)
PLATELETS: 129 10*3/uL — AB (ref 150–400)
RBC: 4 MIL/uL — ABNORMAL LOW (ref 4.22–5.81)
RDW: 13.7 % (ref 11.5–15.5)
WBC: 14.2 10*3/uL — AB (ref 4.0–10.5)

## 2013-04-23 LAB — BASIC METABOLIC PANEL
BUN: 24 mg/dL — ABNORMAL HIGH (ref 6–23)
CHLORIDE: 100 meq/L (ref 96–112)
CO2: 22 mEq/L (ref 19–32)
Calcium: 9 mg/dL (ref 8.4–10.5)
Creatinine, Ser: 0.81 mg/dL (ref 0.50–1.35)
GFR calc Af Amer: >90 mL/min (ref >90)
GFR calc non Af Amer: 84 mL/min — ABNORMAL LOW (ref >90)
Glucose, Bld: 88 mg/dL (ref 70–99)
Potassium: 3.4 mEq/L — ABNORMAL LOW (ref 3.7–5.3)
SODIUM: 140 meq/L (ref 137–147)

## 2013-04-23 LAB — PROTIME-INR
INR: 1.16 (ref 0.00–1.49)
Prothrombin Time: 14.6 seconds (ref 11.6–15.2)

## 2013-04-23 LAB — VANCOMYCIN, TROUGH: Vancomycin Tr: 6.4 ug/mL — ABNORMAL LOW (ref 10.0–20.0)

## 2013-04-23 MED ORDER — POTASSIUM CHLORIDE CRYS ER 20 MEQ PO TBCR
40.0000 meq | EXTENDED_RELEASE_TABLET | Freq: Once | ORAL | Status: DC
  Filled 2013-04-23: qty 2

## 2013-04-23 MED ORDER — VANCOMYCIN HCL IN DEXTROSE 750-5 MG/150ML-% IV SOLN
750.0000 mg | Freq: Two times a day (BID) | INTRAVENOUS | Status: DC
  Administered 2013-04-24 – 2013-04-26 (×6): 750 mg via INTRAVENOUS
  Filled 2013-04-23 (×9): qty 150

## 2013-04-23 NOTE — Progress Notes (Signed)
ANTIBIOTIC CONSULT: VANCOMYCIN INDICATION: CELLULITIS LOWER LEFT LEG  Vancomycin trough: 6.4 on 750 mg q24 hrs. Goal vanc trough: 10-15  Pt's renal function has improved lately so a vanc trough was checked. It came back lower than the desired goal of 10-15. Will increase the Vancomycin dose to 750 mg q12 hrs.Will recheck a trough when appropriate.  Arrie Senate, PharmD

## 2013-04-23 NOTE — Progress Notes (Signed)
[facility] PROGRESS NOTE  [name] QIW:[id number] DOB: [birth year] DOA: 04/20/2013 PCP: Simona Huh, MD  Assessment/Plan: 1.Cellulitis with draining L. Leg wound with calf ulceration and sepsis syndrome -continue vanc, blood cultures to date with no growth -Appreciate Dr. 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 Hermina Barters >>  arteriogram today to determine if his a candidate for extra-anatomic bypass  -follow and consult PT when appropriate 3.Hypokalemia -replace k  Code Status: full Family Communication: none at bedisde Disposition Plan: pending clinical course   Consultants:  Vascular surgery- Dr Donnetta Hutching  Procedures:  none  Antibiotics:  vanc started on 3/7   HPI/Subjective: Remains with increased L. Leg pain.  Objective: Filed Vitals:   04/23/13 1311  BP: 171/78  Pulse: 86  Temp: 97.3 F (36.3 C)  Resp: 18    Intake/Output Summary (Last 24 hours) at 04/23/13 1326 Last data filed at 04/22/13 1800  Gross per 24 hour  Intake      0 ml  Output    500 ml  Net   -500 ml   Filed Weights   04/20/13 2012  Weight: 56.5 kg (124 lb 9 oz)    Exam:  General: awake & oriented x 3 In NAD Cardiovascular: RRR, nl S1 s2 Respiratory: CTAB Abdomen: soft +BS NT/ND, no masses palpable Extremities: R. Lower leg decreasing edema, +erythema and open small wound with decreasing purulent drainage above lat. Malleolus and all of calf area with ulceration, markedly tender to palpation.     Data Reviewed: Basic Metabolic Panel:  Recent Labs Lab 04/20/13 1750 04/21/13 0615 04/23/13 0730  NA 132* 137 140  K 3.9 4.0 3.4*  CL 88* 95* 100  CO2 27 25 22   GLUCOSE 151* 94 88  BUN 26* 27* 24*  CREATININE 1.23 1.26 0.81  CALCIUM 9.9 9.0 9.0   Liver Function Tests:  Recent  Labs Lab 04/21/13 0615  AST 44*  ALT 10  ALKPHOS 79  BILITOT 1.2  PROT 5.9*  ALBUMIN 2.5*   No results found for this basename: LIPASE, AMYLASE,  in the last 168 hours No results found for this basename: AMMONIA,  in the last 168 hours CBC:  Recent Labs Lab 04/20/13 1750 04/21/13 0615 04/23/13 0730  WBC 14.6* 12.2* 14.2*  HGB 15.6 13.5 13.3  HCT 42.9 38.4* 37.4*  MCV 94.7 94.8 93.5  PLT 127* 98* 129*   Cardiac Enzymes: No results found for this basename: CKTOTAL, CKMB, CKMBINDEX, TROPONINI,  in the last 168 hours BNP (last 3 results) No results found for this basename: PROBNP,  in the last 8760 hours CBG: No results found for this basename: GLUCAP,  in the last 168 hours  Recent Results (from the past 240 hour(s))  CULTURE, BLOOD (ROUTINE X 2)     Status: None   Collection Time    04/20/13  9:15 PM      Result Value Ref Range Status   Specimen Description BLOOD RIGHT HAND   Final   Special Requests BOTTLES DRAWN AEROBIC ONLY 4CC   Final   Culture  Setup Time     Final   Value: 04/21/2013 13:08     Performed at [facility]   Culture     Final   Value:        BLOOD CULTURE RECEIVED NO GROWTH TO DATE CULTURE WILL BE HELD FOR  5 DAYS BEFORE ISSUING A FINAL NEGATIVE REPORT     Performed at [facility]   Report Status PENDING   Incomplete  CULTURE, BLOOD (ROUTINE X 2)     Status: None   Collection Time    04/20/13  9:26 PM      Result Value Ref Range Status   Specimen Description BLOOD LEFT HAND   Final   Special Requests BOTTLES DRAWN AEROBIC ONLY 3CC   Final   Culture  Setup Time     Final   Value: 04/21/2013 13:08     Performed at [facility]   Culture     Final   Value:        BLOOD CULTURE RECEIVED NO GROWTH TO DATE CULTURE WILL BE HELD FOR 5 DAYS BEFORE ISSUING A FINAL NEGATIVE REPORT     Performed at [facility]   Report Status PENDING   Incomplete     Studies: No results found.  Scheduled Meds: . ALPRAZolam  1 mg  Oral QHS  . diazepam  10 mg Oral q1800  . diazepam  5 mg Oral Daily  . heparin  5,000 Units Subcutaneous 3 times per day  . mometasone-formoterol  2 puff Inhalation BID  . prednisoLONE  5 mg Oral Q breakfast  . rosuvastatin  20 mg Oral q1800  . sertraline  50 mg Oral Daily  . tiotropium  18 mcg Inhalation Daily  . traZODone  50 mg Oral QHS  . vancomycin  750 mg Intravenous Q24H   Continuous Infusions: . sodium chloride 75 mL/hr at 04/22/13 2229    Active Problems:   Chronic total occlusion of artery of the extremities   Cellulitis   Sepsis    Time spent: [facility] Pager [phone]. If 7PM-7AM, please contact night-coverage at [email or web address], password [facility] 04/23/2013, 1:26 PM  LOS: 3 days

## 2013-04-23 NOTE — Progress Notes (Addendum)
Subjective: Interval History: none.. sleeping this morning. Continues to report right leg pain when awakened.  Objective: Vital signs in last 24 hours: Temp:  [97.3 F (36.3 C)-98.1 F (36.7 C)] 97.6 F (36.4 C) (03/10 0515) Pulse Rate:  [89-93] 89 (03/10 0515) Resp:  [18-20] 20 (03/10 0515) BP: (141-166)/(69-74) 166/73 mmHg (03/10 0515) SpO2:  [94 %-99 %] 95 % (03/10 0515)  Intake/Output from previous day: 03/09 0701 - 03/10 0700 In: 0  Out: 500 [Urine:500] Intake/Output this shift:    Slightly less swelling in right calf. Still marked erythema. No change in the lateral distal calf wound.  Lab Results:  Recent Labs  04/20/13 1750 04/21/13 0615  WBC 14.6* 12.2*  HGB 15.6 13.5  HCT 42.9 38.4*  PLT 127* 98*   BMET  Recent Labs  04/20/13 1750 04/21/13 0615  NA 132* 137  K 3.9 4.0  CL 88* 95*  CO2 27 25  GLUCOSE 151* 94  BUN 26* 27*  CREATININE 1.23 1.26  CALCIUM 9.9 9.0    Studies/Results: No results found. Anti-infectives: Anti-infectives   Start     Dose/Rate Route Frequency Ordered Stop   04/21/13 1900  vancomycin (VANCOCIN) IVPB 750 mg/150 ml premix     750 mg 150 mL/hr over 60 Minutes Intravenous Every 24 hours 04/20/13 2034     04/20/13 1800  vancomycin (VANCOCIN) IVPB 1000 mg/200 mL premix     1,000 mg 200 mL/hr over 60 Minutes Intravenous  Once 04/20/13 1748 04/20/13 1924      Assessment/Plan: s/p * No surgery found * Hemodynamically stable. Remains afebrile. Still with the marked erythema. We will plan arteriogram for evaluation of right leg arterial flow. Will check CBC been at an INR pre-angio    LOS: 3 days   [name] 04/23/2013, 6:49 AM

## 2013-04-23 NOTE — Evaluation (Signed)
Clinical/Bedside Swallow Evaluation Patient Details  Name: [name] MRN: [id number] Date of Birth: [birth year]  Today's Date: 04/23/2013 Time: 5638-9373 SLP Time Calculation (min): 15 min  Past Medical History:  Past Medical History  Diagnosis Date  . Hyperlipidemia   . History of colon polyps   . Spinal stenosis   . Anxiety   . BPH (benign prostatic hypertrophy)   . ED (erectile dysfunction)   . Osteoporosis   . Hypertension   . Chest pain     "I've had it" (01/31/2012)   . Peripheral vascular disease     "right leg is 100% blocked; left leg has stent, still ~ 75% blocked" (01/31/2012)  . Peripheral neuropathy     "bad" (01/31/2012)  . COPD (chronic obstructive pulmonary disease)   . Emphysema   . Pneumonia 1960's    "double" (01/31/2012)  . BRBPR (bright red blood per rectum)     "don't know what it's from; had some this week" (01/31/2012)  . Cervical spondylosis   . Rheumatoid arthritis(714.0)   . Arthritis     "hands and feet" (01/31/2012)  . DDD (degenerative disc disease), lumbosacral     "S1; L5" (01/31/2012)  . Chronic lower back pain   . Depression     "imagine I've got a mild depression w/what I've gone thru last month" (01/31/2012)  . Prostate cancer 2004    "had 40 treatments of radiation" (01/31/2012)  . Breast cancer in male     "left" (01/31/2012)  . Basal cell carcinoma of nasal tip     "  . Pernicious anemia   . GERD (gastroesophageal reflux disease)   . Polyneuropathy in other diseases classified elsewhere 11/06/2012  . Lumbosacral spondylosis without myelopathy 11/06/2012   Past Surgical History:  Past Surgical History  Procedure Laterality Date  . Basal cell carcinoma excision      Nose x 3  . Cholecystectomy  ?1990's  . Wrist fusion      Right Wrist; "3 OR's; it's fused" (01/31/2012)  . Excisional hemorrhoidectomy  1960's  . Knee surgery      "right; X 6 surgeries; went in for simple cartilage OR; ended up w/fused knee" (01/31/2012)   . Iliac artery stent  12-07-09    Stent done by Dr. Gwenlyn Found  . Total hip arthroplasty  08/16/2011    Procedure: TOTAL HIP ARTHROPLASTY ANTERIOR APPROACH;  Surgeon: Mcarthur Rossetti, MD;  Location: [facility];  Service: Orthopedics;  Laterality: Right;  Right total hip replacement  . Cervical disc surgery      "7 total; ended w/a fusion" (01/31/2012)  . Esophagogastroduodenoscopy  02/22/2012    Procedure: ESOPHAGOGASTRODUODENOSCOPY (EGD);  Surgeon: Arta Silence, MD;  Location: [facility];  Service: Endoscopy;  Laterality: N/A;  . Eus  02/22/2012    Procedure: UPPER ENDOSCOPIC ULTRASOUND (EUS) RADIAL;  Surgeon: Arta Silence, MD;  Location: [facility];  Service: Endoscopy;  Laterality: N/A;  . Hernia repair    . Transurethral resection of prostate     HPI:  79 y/o male with chief complaint of right leg redness, sores and pain. x Cellulitis with draining L. Leg wound and sepsis .  RN reports increased coughing with meds, swallow eval ordered. Per chart, pt was alert and oriented x3 on 3/9.    Assessment / Plan / Recommendation Clinical Impression  Pt with poor PO toleration this am.  He is disoriented, having difficulty following commands, and maintained eyes closed for duration of assessment.  No focal deficits, but  with variable attention to bolus, delayed swallow initiation, and immediate congested, wet coughing associated with ice chips and minimal water.  Consumed purees with milder symptoms of dysphagia but no overt signs of aspiration.  Pt not sufficiently alert to attempt mechanical solids.   Pt with likely acute reversible dysphagia related to changes in mental status.  Given poor attention and state of alertness today, recommend continuing NPO except for meds crushed in puree.  SLP will follow for improved tolerance.      Aspiration Risk  Severe    Diet Recommendation NPO except meds   Medication Administration: Crushed with puree Postural Changes and/or Swallow Maneuvers: Seated  upright 90 degrees    Other  Recommendations Oral Care Recommendations: Oral care Q4 per protocol Other Recommendations: Have oral suction available   Follow Up Recommendations   (tba)    Frequency and Duration min 2x/week  1 week       SLP Swallow Goals     Swallow Study Prior Functional Status       General H Type of Study: Bedside swallow evaluation Previous Swallow Assessment: none per records Diet Prior to this Study: NPO Temperature Spikes Noted: No Respiratory Status: Nasal cannula History of Recent Intubation: No Behavior/Cognition: Confused;Lethargic Oral Cavity - Dentition: Missing dentition Self-Feeding Abilities: Needs assist Patient Positioning: Upright in bed Baseline Vocal Quality: Clear Volitional Cough: Cognitively unable to elicit Volitional Swallow: Unable to elicit    Oral/Motor/Sensory Function Overall Oral Motor/Sensory Function: Appears within functional limits for tasks assessed   Ice Chips Ice chips: Impaired Presentation: Spoon Pharyngeal Phase Impairments: Suspected delayed Swallow;Cough - Immediate   Thin Liquid Thin Liquid: Impaired Presentation: Cup Oral Phase Impairments: Poor awareness of bolus Pharyngeal  Phase Impairments: Suspected delayed Swallow;Multiple swallows;Cough - Immediate    Nectar Thick Nectar Thick Liquid: Not tested   Honey Thick Honey Thick Liquid: Not tested   Puree Puree: Impaired Presentation: Spoon Oral Phase Impairments: Poor awareness of bolus Pharyngeal Phase Impairments: Multiple swallows   Solid  Berthe Oley L. [place], [place] CCC/SLP Pager [phone]     Solid: Not tested       Juan Quam Laurice 04/23/2013,9:50 AM

## 2013-04-23 NOTE — Progress Notes (Signed)
ANTIBIOTIC CONSULT NOTE - Follow up Pharmacy Consult for vancomycin Indication: cellulitis  Allergies  Allergen Reactions  . Lipitor [Atorvastatin Calcium] Other (See Comments)    Myalgias "don't remember how bad" (01/31/2012)  . Penicillins Other (See Comments)    Passed out     Patient Measurements: Height: 5' 8.9" (175 cm) Weight: 124 lb 9 oz (56.5 kg) IBW/kg (Calculated) : 70.47  Vital Signs: Temp: 99.5 F (37.5 C) (03/10 1330) Temp src: Oral (03/10 1330) BP: 145/58 mmHg (03/10 1330) Pulse Rate: 95 (03/10 1330) Intake/Output from previous day: 03/09 0701 - 03/10 0700 In: 0  Out: 500 [Urine:500] Intake/Output from this shift: Total I/O In: 240 [P.O.:240] Out: 1 [Stool:1   Labs:  Recent Labs  04/20/13 1750 04/21/13 0615 04/23/13 0730  WBC 14.6* 12.2* 14.2*  HGB 15.6 13.5 13.3  PLT 127* 98* 129*  CREATININE 1.23 1.26 0.81   Estimated Creatinine Clearance: 62 ml/min (by C-G formula based on Cr of 0.81). No results found for this basename: VANCOTROUGH, VANCOPEAK, VANCORANDOM, [place], GENTPEAK, GENTRANDOM, TOBRATROUGH, TOBRAPEAK, TOBRARND, AMIKACINPEAK, AMIKACINTROU, AMIKACIN,  in the last 72 hours      Microbiology: Recent Results (from the past 720 hour(s))  CULTURE, BLOOD (ROUTINE X 2)     Status: None   Collection Time    04/20/13  9:15 PM      Result Value Ref Range Status   Specimen Description BLOOD RIGHT HAND   Final   Special Requests BOTTLES DRAWN AEROBIC ONLY 4CC   Final   Culture  Setup Time     Final   Value: 04/21/2013 13:08     Performed at [facility]   Culture     Final   Value:        BLOOD CULTURE RECEIVED NO GROWTH TO DATE CULTURE WILL BE HELD FOR 5 DAYS BEFORE ISSUING A FINAL NEGATIVE REPORT     Performed at [facility]   Report Status PENDING   Incomplete  CULTURE, BLOOD (ROUTINE X 2)     Status: None   Collection Time    04/20/13  9:26 PM      Result Value Ref Range Status   Specimen Description BLOOD  LEFT HAND   Final   Special Requests BOTTLES DRAWN AEROBIC ONLY 3CC   Final   Culture  Setup Time     Final   Value: 04/21/2013 13:08     Performed at [facility]   Culture     Final   Value:        BLOOD CULTURE RECEIVED NO GROWTH TO DATE CULTURE WILL BE HELD FOR 5 DAYS BEFORE ISSUING A FINAL NEGATIVE REPORT     Performed at [facility]   Report Status PENDING   Incomplete    Anti-infectives   Start     Dose/Rate Route Frequency Ordered Stop   04/21/13 1900  vancomycin (VANCOCIN) IVPB 750 mg/150 ml premix     750 mg 150 mL/hr over 60 Minutes Intravenous Every 24 hours 04/20/13 2034     04/20/13 1800  vancomycin (VANCOCIN) IVPB 1000 mg/200 mL premix     1,000 mg 200 mL/hr over 60 Minutes Intravenous  Once 04/20/13 1748 04/20/13 1924      Medical History: Past Medical History  Diagnosis Date  . Hyperlipidemia   . History of colon polyps   . Spinal stenosis   . Anxiety   . BPH (benign prostatic hypertrophy)   . ED (erectile dysfunction)   .  Osteoporosis   . Hypertension   . Chest pain     "I've had it" (01/31/2012)   . Peripheral vascular disease     "right leg is 100% blocked; left leg has stent, still ~ 75% blocked" (01/31/2012)  . Peripheral neuropathy     "bad" (01/31/2012)  . COPD (chronic obstructive pulmonary disease)   . Emphysema   . Pneumonia 1960's    "double" (01/31/2012)  . BRBPR (bright red blood per rectum)     "don't know what it's from; had some this week" (01/31/2012)  . Cervical spondylosis   . Rheumatoid arthritis(714.0)   . Arthritis     "hands and feet" (01/31/2012)  . DDD (degenerative disc disease), lumbosacral     "S1; L5" (01/31/2012)  . Chronic lower back pain   . Depression     "imagine I've got a mild depression w/what I've gone thru last month" (01/31/2012)  . Prostate cancer 2004    "had 40 treatments of radiation" (01/31/2012)  . Breast cancer in male     "left" (01/31/2012)  . Basal cell carcinoma of nasal tip      "  . Pernicious anemia   . GERD (gastroesophageal reflux disease)   . Polyneuropathy in other diseases classified elsewhere 11/06/2012  . Lumbosacral spondylosis without myelopathy 11/06/2012    Medications:  See med list  Assessment: 79 year old male on  Day #4 IV vancomycin for R lower extremity cellulitis with draining R. leg wound and sepsis.  Tc = 99.5, Tm 99.5, WBC 14.2. SCr down to 0.81 < 1.26.  04/20/13 Blood cultures x 2 no growth to date.   Goal of Therapy:  Vancomycin trough level 10-15 mcg/ml  Plan:  Continue Vancomycin 750 mg IV q24h Vancomycin trough tonight at 18:30 Follow clinical course   Nicole Cella, RPh Clinical Pharmacist Pager: [phone] 04/23/2013 2:42 PM

## 2013-04-24 ENCOUNTER — Inpatient Hospital Stay (HOSPITAL_COMMUNITY): Payer: Medicare Other

## 2013-04-24 ENCOUNTER — Encounter (HOSPITAL_COMMUNITY)
Admission: EM | Disposition: A | Discharge status: Skilled Nursing Facility | Payer: Self-pay | Source: Home / Self Care | Attending: Vascular Surgery

## 2013-04-24 DIAGNOSIS — R05 Cough: Secondary | ICD-10-CM | POA: Clinically undetermined

## 2013-04-24 DIAGNOSIS — R059 Cough, unspecified: Secondary | ICD-10-CM | POA: Clinically undetermined

## 2013-04-24 DIAGNOSIS — R1310 Dysphagia, unspecified: Secondary | ICD-10-CM | POA: Diagnosis present

## 2013-04-24 DIAGNOSIS — R197 Diarrhea, unspecified: Secondary | ICD-10-CM | POA: Diagnosis not present

## 2013-04-24 LAB — BASIC METABOLIC PANEL
BUN: 24 mg/dL — AB (ref 6–23)
CHLORIDE: 102 meq/L (ref 96–112)
CO2: 16 meq/L — AB (ref 19–32)
Calcium: 8.8 mg/dL (ref 8.4–10.5)
Creatinine, Ser: 0.71 mg/dL (ref 0.50–1.35)
GFR calc Af Amer: >90 mL/min (ref >90)
GFR calc non Af Amer: 89 mL/min — ABNORMAL LOW (ref >90)
GLUCOSE: 90 mg/dL (ref 70–99)
POTASSIUM: 3.4 meq/L — AB (ref 3.7–5.3)
Sodium: 138 mEq/L (ref 137–147)

## 2013-04-24 LAB — CBC
HEMATOCRIT: 39.3 % (ref 39.0–52.0)
Hemoglobin: 13.8 g/dL (ref 13.0–17.0)
MCH: 32.7 pg (ref 26.0–34.0)
MCHC: 35.1 g/dL (ref 30.0–36.0)
MCV: 93.1 fL (ref 78.0–100.0)
Platelets: 151 10*3/uL (ref 150–400)
RBC: 4.22 MIL/uL (ref 4.22–5.81)
RDW: 14 % (ref 11.5–15.5)
WBC: 12.2 10*3/uL — ABNORMAL HIGH (ref 4.0–10.5)

## 2013-04-24 LAB — PROTIME-INR
INR: 1.13 (ref 0.00–1.49)
Prothrombin Time: 14.3 seconds (ref 11.6–15.2)

## 2013-04-24 SURGERY — ABDOMINAL AORTAGRAM
Anesthesia: LOCAL

## 2013-04-24 MED ORDER — POTASSIUM CHLORIDE 10 MEQ/100ML IV SOLN
10.0000 meq | INTRAVENOUS | Status: AC
  Administered 2013-04-24 (×4): 10 meq via INTRAVENOUS
  Filled 2013-04-24 (×4): qty 100

## 2013-04-24 MED ORDER — CIPROFLOXACIN IN D5W 400 MG/200ML IV SOLN
400.0000 mg | Freq: Two times a day (BID) | INTRAVENOUS | Status: DC
  Administered 2013-04-24 – 2013-05-05 (×22): 400 mg via INTRAVENOUS
  Filled 2013-04-24 (×26): qty 200

## 2013-04-24 MED ORDER — LABETALOL HCL 5 MG/ML IV SOLN
10.0000 mg | Freq: Once | INTRAVENOUS | Status: AC
  Administered 2013-04-24: 10 mg via INTRAVENOUS
  Filled 2013-04-24: qty 4

## 2013-04-24 MED ORDER — HYDRALAZINE HCL 20 MG/ML IJ SOLN
10.0000 mg | Freq: Once | INTRAMUSCULAR | Status: AC
  Administered 2013-04-24: 10 mg via INTRAVENOUS
  Filled 2013-04-24: qty 1

## 2013-04-24 MED ORDER — RESOURCE THICKENUP CLEAR PO POWD
ORAL | Status: DC | PRN
  Filled 2013-04-24: qty 125

## 2013-04-24 NOTE — Progress Notes (Signed)
Speech Language Pathology Treatment: Dysphagia  Patient Details Name: [name] MRN: [id number] DOB: 17-Apr-1934 Today's Date: 04/24/2013 Time: 6286-3817 SLP Time Calculation (min): 18 min  Assessment / Plan / Recommendation Clinical Impression  F/u after 3/10 clinical swallow evaluation.  Pt with improved alertness today; son present.  Continues to present with congested cough after consumption of thin liquids.  Purees, honey-thick tolerated with no overt coughing, but pt c/o "food sticking." Son reports hx of a "tight throat." Pt was seen by GI last year with recs for endoscopy, but no records of its completion could be found.  Pt apparently eats purees at home; son attributes this to difficulty swallowing, but his description of deficits sounds more like difficulty chewing.  Given continuing dysphagia symptoms, recommend proceeding next date with MBS to further elucidate source of problems.     HPI HPI: 79 y/o male with chief complaint of right leg redness, sores and pain.Dx Cellulitis with draining L. Leg wound and sepsis .  RN reports increased coughing with meds, swallow eval completed 3/10; pt lethargic, NPO recommended at that time.   Pertinent Vitals SP02 98% on [place]  SLP Plan  MBS    Recommendations Diet recommendations: Dysphagia 1 (puree);Honey-thick liquid Medication Administration: Crushed with puree Postural Changes and/or Swallow Maneuvers: Seated upright 90 degrees              Oral Care Recommendations: Oral care Q4 per protocol Plan: MBS   Troye Hiemstra L. Tivis Ringer, [place] CCC/SLP Pager [phone]      Juan Quam Laurice 04/24/2013, 3:33 PM

## 2013-04-24 NOTE — Progress Notes (Signed)
Chart reviewed.  [facility] PROGRESS NOTE  [name] XBM:[id number] DOB: 1935/01/16 DOA: 04/20/2013 PCP: Simona Huh, MD  Assessment/Plan: 1.Cellulitis with draining L. Leg wound with calf ulceration and sepsis syndrome Still intensely erythematous. Add cipro. PCN allergic -Appreciate Dr. 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 3.Hypokalemia -replace k  Diarrhea: check for C diff  Cough: rhonchorous breath sounds on exam. Check CXR.  Chronic dysphagia: pt reports he is on pureed diet at home. Currently npo per speech recs. Await re-evaluation. Was reportedly lethargic during eval yesterday. Now awake  COPD (severe, per patient): no wheeze  Pt is on multiple sedating meds, including valium and xanax, IV and oral pain medications.  Will simplify regimen and decrease as able.  Still somewhat groggy.  Debility: patient lives alone and gets around by walker.  Very frail. Will likely need ST SNF. Pt is agreeable. [place].  Will consult PT  Code Status: full Family Communication: son at bedside Disposition Plan: probable SNF   Consultants:  Vascular surgery- Dr Donnetta Hutching  Procedures:  none  Antibiotics:  vanc started on 3/7   cipro 3/11  HPI/Subjective: Has had several loose stools over the past few days. Denies N,V. No abd pain or bloating. Cough productive of clear sputum. Denies dyspnea  Objective: Filed Vitals:   04/24/13 0959  BP: 170/79  Pulse: 92  Temp: 97.6 F (36.4 C)  Resp: 18    Intake/Output Summary (Last 24 hours) at 04/24/13 1314 Last data filed at 04/24/13 0654  Gross per 24 hour  Intake 1802.5 ml  Output      1 ml  Net 1801.5 ml   Filed Weights   04/20/13 2012  Weight: 56.5 kg (124 lb 9 oz)    Exam:  General: awake. Weak appearing. coughing Cardiovascular: RRR, nl S1 s2 Respiratory: bilateral rhonchi no  wheeze or rales Abdomen: soft +BS NT/ND, no masses palpable Extremities: R leg bright red. Ulcer lateral malleolus area. Blistering medially  Data Reviewed: Basic Metabolic Panel:  Recent Labs Lab 04/20/13 1750 04/21/13 0615 04/23/13 0730 04/24/13 0600  NA 132* 137 140 138  K 3.9 4.0 3.4* 3.4*  CL 88* 95* 100 102  CO2 27 25 22  16*  GLUCOSE 151* 94 88 90  BUN 26* 27* 24* 24*  CREATININE 1.23 1.26 0.81 0.71  CALCIUM 9.9 9.0 9.0 8.8   Liver Function Tests:  Recent Labs Lab 04/21/13 0615  AST 44*  ALT 10  ALKPHOS 79  BILITOT 1.2  PROT 5.9*  ALBUMIN 2.5*   No results found for this basename: LIPASE, AMYLASE,  in the last 168 hours No results found for this basename: AMMONIA,  in the last 168 hours CBC:  Recent Labs Lab 04/20/13 1750 04/21/13 0615 04/23/13 0730 04/24/13 0600  WBC 14.6* 12.2* 14.2* 12.2*  HGB 15.6 13.5 13.3 13.8  HCT 42.9 38.4* 37.4* 39.3  MCV 94.7 94.8 93.5 93.1  PLT 127* 98* 129* 151   Cardiac Enzymes: No results found for this basename: CKTOTAL, CKMB, CKMBINDEX, TROPONINI,  in the last 168 hours BNP (last 3 results) No results found for this basename: PROBNP,  in the last 8760 hours CBG: No results found for this basename: GLUCAP,  in the last 168 hours  Recent Results (from the past 240 hour(s))  CULTURE, BLOOD (ROUTINE X 2)     Status: None   Collection Time    04/20/13  9:15 PM      Result Value Ref Range Status   Specimen Description BLOOD RIGHT HAND   Final   Special Requests BOTTLES DRAWN AEROBIC ONLY 4CC   Final   Culture  Setup Time     Final   Value: 04/21/2013 13:08     Performed at [facility]   Culture     Final   Value:        BLOOD CULTURE RECEIVED NO GROWTH TO DATE CULTURE WILL BE HELD FOR 5 DAYS BEFORE ISSUING A FINAL NEGATIVE REPORT     Performed at [facility]   Report Status PENDING   Incomplete  CULTURE, BLOOD (ROUTINE X 2)     Status: None   Collection Time    04/20/13  9:26 PM      Result  Value Ref Range Status   Specimen Description BLOOD LEFT HAND   Final   Special Requests BOTTLES DRAWN AEROBIC ONLY 3CC   Final   Culture  Setup Time     Final   Value: 04/21/2013 13:08     Performed at [facility]   Culture     Final   Value:        BLOOD CULTURE RECEIVED NO GROWTH TO DATE CULTURE WILL BE HELD FOR 5 DAYS BEFORE ISSUING A FINAL NEGATIVE REPORT     Performed at [facility]   Report Status PENDING   Incomplete     Studies: No results found.  Scheduled Meds: . ALPRAZolam  1 mg Oral QHS  . diazepam  10 mg Oral q1800  . diazepam  5 mg Oral Daily  . heparin  5,000 Units Subcutaneous 3 times per day  . mometasone-formoterol  2 puff Inhalation BID  . potassium chloride  40 mEq Oral Once  . prednisoLONE  5 mg Oral Q breakfast  . rosuvastatin  20 mg Oral q1800  . sertraline  50 mg Oral Daily  . tiotropium  18 mcg Inhalation Daily  . traZODone  50 mg Oral QHS  . vancomycin  750 mg Intravenous Q12H   Continuous Infusions: . sodium chloride 75 mL/hr at 04/24/13 3013   Time spent: 35 min  [facility] Pager [phone]. If 7PM-7AM, please contact night-coverage at [email or web address], password [facility] 04/24/2013, 1:14 PM  LOS: 4 days

## 2013-04-25 ENCOUNTER — Encounter (HOSPITAL_COMMUNITY): Payer: Self-pay | Admitting: General Practice

## 2013-04-25 ENCOUNTER — Encounter (HOSPITAL_COMMUNITY)
Admission: EM | Disposition: A | Discharge status: Skilled Nursing Facility | Payer: Self-pay | Source: Home / Self Care | Attending: Vascular Surgery

## 2013-04-25 ENCOUNTER — Inpatient Hospital Stay (HOSPITAL_COMMUNITY): Payer: Medicare Other

## 2013-04-25 DIAGNOSIS — R1310 Dysphagia, unspecified: Secondary | ICD-10-CM

## 2013-04-25 HISTORY — PX: ABDOMINAL AORTAGRAM: SHX5454

## 2013-04-25 HISTORY — PX: AORTOGRAM: SHX6300

## 2013-04-25 LAB — CLOSTRIDIUM DIFFICILE BY PCR: Toxigenic C. Difficile by PCR: NEGATIVE

## 2013-04-25 SURGERY — ABDOMINAL AORTAGRAM
Anesthesia: LOCAL

## 2013-04-25 MED ORDER — DIAZEPAM 2 MG PO TABS
2.0000 mg | ORAL_TABLET | Freq: Every day | ORAL | Status: DC
  Administered 2013-04-26 – 2013-04-27 (×2): 2 mg via ORAL
  Filled 2013-04-25 (×2): qty 1

## 2013-04-25 MED ORDER — HEPARIN (PORCINE) IN NACL 2-0.9 UNIT/ML-% IJ SOLN
INTRAMUSCULAR | Status: AC
  Filled 2013-04-25: qty 1000

## 2013-04-25 MED ORDER — LIDOCAINE HCL (PF) 1 % IJ SOLN
INTRAMUSCULAR | Status: AC
  Filled 2013-04-25: qty 30

## 2013-04-25 MED ORDER — HYDRALAZINE HCL 20 MG/ML IJ SOLN
INTRAMUSCULAR | Status: AC
  Filled 2013-04-25: qty 1

## 2013-04-25 MED ORDER — MIDAZOLAM HCL 2 MG/2ML IJ SOLN
INTRAMUSCULAR | Status: AC
  Filled 2013-04-25: qty 2

## 2013-04-25 MED ORDER — FENTANYL CITRATE 0.05 MG/ML IJ SOLN
INTRAMUSCULAR | Status: AC
  Filled 2013-04-25: qty 2

## 2013-04-25 NOTE — Care Management Note (Signed)
04/25/13 11:00am Ricki Miller, RN BSN Case Manager [phone] patient confused, weak. Right leg with iliac occlusion. Went for arteriogram this AM. Patient will likely need shortterm rehab. Lives alone. Has son and daughter for support.Social worker is aware.Family wants [facility].

## 2013-04-25 NOTE — Progress Notes (Signed)
CM informed CSW that due to Patient's debilitated state patient would likely need SNF. Patient has not worked with PT due to illness. Patient's family indicated that the patient would like to go to [facility]. CSW was unable to see patient due to a procedure. Patient is now being transferred to another unit. CSW will speak with the CSW on the new unit to make them aware.     Rhea Pink, MSW, [place].

## 2013-04-25 NOTE — Progress Notes (Addendum)
Patient just had a bowel movement. Cath Lab called and notified Crystal.

## 2013-04-25 NOTE — Procedures (Signed)
Objective Swallowing Evaluation: Modified Barium Swallowing Study  Patient Details  Name: [name] MRN: [id number] Date of Birth: 1934/10/11  Today's Date: 04/25/2013 Time: 4098-1191 SLP Time Calculation (min): 20 min  Past Medical History:  Past Medical History  Diagnosis Date  . Hyperlipidemia   . History of colon polyps   . Spinal stenosis   . Anxiety   . BPH (benign prostatic hypertrophy)   . ED (erectile dysfunction)   . Osteoporosis   . Hypertension   . Chest pain     "I've had it" (01/31/2012)   . Peripheral vascular disease     "right leg is 100% blocked; left leg has stent, still ~ 75% blocked" (01/31/2012)  . Peripheral neuropathy     "bad" (01/31/2012)  . COPD (chronic obstructive pulmonary disease)   . Emphysema   . Pneumonia 1960's    "double" (01/31/2012)  . BRBPR (bright red blood per rectum)     "don't know what it's from; had some this week" (01/31/2012)  . Cervical spondylosis   . Rheumatoid arthritis(714.0)   . Arthritis     "hands and feet" (01/31/2012)  . DDD (degenerative disc disease), lumbosacral     "S1; L5" (01/31/2012)  . Chronic lower back pain   . Depression     "imagine I've got a mild depression w/what I've gone thru last month" (01/31/2012)  . Prostate cancer 2004    "had 40 treatments of radiation" (01/31/2012)  . Breast cancer in male     "left" (01/31/2012)  . Basal cell carcinoma of nasal tip     "  . Pernicious anemia   . GERD (gastroesophageal reflux disease)   . Polyneuropathy in other diseases classified elsewhere 11/06/2012  . Lumbosacral spondylosis without myelopathy 11/06/2012   Past Surgical History:  Past Surgical History  Procedure Laterality Date  . Basal cell carcinoma excision      Nose x 3  . Cholecystectomy  ?1990's  . Wrist fusion      Right Wrist; "3 OR's; it's fused" (01/31/2012)  . Excisional hemorrhoidectomy  1960's  . Knee surgery      "right; X 6 surgeries; went in for simple cartilage OR; ended  up w/fused knee" (01/31/2012)  . Iliac artery stent  12-07-09    Stent done by Dr. Gwenlyn Found  . Total hip arthroplasty  08/16/2011    Procedure: TOTAL HIP ARTHROPLASTY ANTERIOR APPROACH;  Surgeon: Mcarthur Rossetti, MD;  Location: [facility];  Service: Orthopedics;  Laterality: Right;  Right total hip replacement  . Cervical disc surgery      "7 total; ended w/a fusion" (01/31/2012)  . Esophagogastroduodenoscopy  02/22/2012    Procedure: ESOPHAGOGASTRODUODENOSCOPY (EGD);  Surgeon: Arta Silence, MD;  Location: [facility];  Service: Endoscopy;  Laterality: N/A;  . Eus  02/22/2012    Procedure: UPPER ENDOSCOPIC ULTRASOUND (EUS) RADIAL;  Surgeon: Arta Silence, MD;  Location: [facility];  Service: Endoscopy;  Laterality: N/A;  . Hernia repair    . Transurethral resection of prostate     HPI:  79 y/o male with chief complaint of right leg redness, sores and pain.Dx Cellulitis with draining L. Leg wound and sepsis .  History of ACDF, significant COPD/emphesema, cervical spondylosis.  RN reports increased coughing with meds, swallow eval ordered.  CXR  Mild increased interstitial markings suggesting the possibility of interstitial pneumonitis.     Assessment / Plan / Recommendation Clinical Impression  Dysphagia Diagnosis: Mild oral phase dysphagia;Mild pharyngeal phase dysphagia;Moderate pharyngeal phase dysphagia  Clinical impression: Pt. exhibited mild oral impairment characterized by prolonged mastication likely due to dentures not present (lower plate broken).  Pharyngeal phase evidenced by sensory deficits that is characteristic of pt.'s with COPD.  Decreased pharyngeal sensation led to late swallow initiating at pyriform sinsues with flash laryngeal penetration of cup sip thin.  Moderate amount of aspiration with straw sips thin given increased velocity and sip size with reflexive cough.  SLP recommending Dys 2 diet texture and thin liquids, NO straws, pills whole in applesauce, small sips sitting  upright and rest breaks when needed.  ST will continue to follow.     Treatment Recommendation  Therapy as outlined in treatment plan below    Diet Recommendation Dysphagia 2 (Fine chop);Thin liquid   Liquid Administration via: No straw;Cup Medication Administration: Whole meds with puree Supervision: Patient able to self feed Compensations: Slow rate;Small sips/bites (rest breaks) Postural Changes and/or Swallow Maneuvers: Seated upright 90 degrees    Other  Recommendations Oral Care Recommendations: Oral care BID   Follow Up Recommendations  None    Frequency and Duration min 2x/week  1 week   Pertinent Vitals/Pain WDL            Reason for Referral Objectively evaluate swallowing function   Oral Phase Oral Preparation/Oral Phase Oral Phase: Impaired Oral - Solids Oral - Regular: Delayed oral transit;Weak lingual manipulation   Pharyngeal Phase Pharyngeal Phase Pharyngeal Phase: Impaired Pharyngeal - Nectar Pharyngeal - Nectar Teaspoon: Delayed swallow initiation;Premature spillage to pyriform sinuses Pharyngeal - Nectar Cup: Delayed swallow initiation;Premature spillage to pyriform sinuses Pharyngeal - Thin Pharyngeal - Thin Teaspoon: Delayed swallow initiation;Premature spillage to pyriform sinuses Pharyngeal - Thin Cup: Delayed swallow initiation;Premature spillage to pyriform sinuses;Penetration/Aspiration during swallow Penetration/Aspiration details (thin cup): Material enters airway, remains ABOVE vocal cords then ejected out Pharyngeal - Thin Straw: Penetration/Aspiration during swallow;Delayed swallow initiation;Premature spillage to pyriform sinuses;Moderate aspiration;Reduced airway/laryngeal closure Penetration/Aspiration details (thin straw): Material enters airway, passes BELOW cords and not ejected out despite cough attempt by patient Pharyngeal - Solids Pharyngeal - Regular: Within functional limits  Cervical Esophageal Phase    GO    Cervical  Esophageal Phase Cervical Esophageal Phase: [facility]         [place]ran[place] Mon[employer] [phone]  04/25/2013

## 2013-04-25 NOTE — Progress Notes (Signed)
PT Cancellation Note  Patient Details Name: [name] MRN: [id number] DOB: 29-Sep-1934   Cancelled Treatment:    Reason Eval/Treat Not Completed: Patient at procedure or test/unavailable   Duncan Dull 04/25/2013, 10:56 AM Alben Deeds, PT DPT  [phone]

## 2013-04-25 NOTE — Progress Notes (Signed)
Chart reviewed.  [facility] PROGRESS NOTE  [name] NOI:[id number] DOB: May 06, 1934 DOA: 04/20/2013 PCP: Simona Huh, MD  Assessment/Plan: 1.Cellulitis with draining L. Leg wound with calf ulceration and sepsis syndrome Still intensely erythematous. Continue current. 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 Will order prn imodium  Cough: CXR shows nonspecific findings  Chronic dysphagia: modified diet per speech  COPD (severe, per patient): no wheeze  Debility: patient lives alone and gets around by walker.  Very frail. Will likely need ST SNF. Pt is agreeable. [place].  PT eval pending  Code Status: full Family Communication: son at bedside Disposition Plan: probable SNF   Consultants:  Vascular surgery- Dr Donnetta Hutching  Procedures:  arteriogram  Antibiotics:  vanc started on 3/7   cipro 3/11  HPI/Subjective: Leg pain continues. Per rn, still with loose stool. Poor PO intake  Objective: Filed Vitals:   04/25/13 2020  BP:   Pulse: 106  Temp:   Resp: 18    Intake/Output Summary (Last 24 hours) at 04/25/13 2123 Last data filed at 04/25/13 2000  Gross per 24 hour  Intake 3182.5 ml  Output      0 ml  Net 3182.5 ml   Filed Weights   04/20/13 2012  Weight: 56.5 kg (124 lb 9 oz)    Exam:  General: awake. Weak appearing.  Cardiovascular: RRR, nl S1 s2 Respiratory: less rhonchi Abdomen: soft +BS NT/ND, no masses palpable Extremities: R leg bright red. Ulcer lateral malleolus area. Blistering medially, serous drainage  Data Reviewed: Basic Metabolic Panel:  Recent Labs Lab 04/20/13 1750 04/21/13 0615 04/23/13 0730 04/24/13 0600  NA 132* 137 140 138  K 3.9 4.0 3.4* 3.4*  CL 88* 95* 100 102  CO2 27 25 22  16*  GLUCOSE 151* 94 88 90  BUN 26* 27* 24* 24*  CREATININE 1.23 1.26 0.81 0.71  CALCIUM 9.9 9.0 9.0 8.8   Liver Function  Tests:  Recent Labs Lab 04/21/13 0615  AST 44*  ALT 10  ALKPHOS 79  BILITOT 1.2  PROT 5.9*  ALBUMIN 2.5*   No results found for this basename: LIPASE, AMYLASE,  in the last 168 hours No results found for this basename: AMMONIA,  in the last 168 hours CBC:  Recent Labs Lab 04/20/13 1750 04/21/13 0615 04/23/13 0730 04/24/13 0600  WBC 14.6* 12.2* 14.2* 12.2*  HGB 15.6 13.5 13.3 13.8  HCT 42.9 38.4* 37.4* 39.3  MCV 94.7 94.8 93.5 93.1  PLT 127* 98* 129* 151   Cardiac Enzymes: No results found for this basename: CKTOTAL, CKMB, CKMBINDEX, TROPONINI,  in the last 168 hours BNP (last 3 results) No results found for this basename: PROBNP,  in the last 8760 hours CBG: No results found for this basename: GLUCAP,  in the last 168 hours  Recent Results (from the past 240 hour(s))  CULTURE, BLOOD (ROUTINE X 2)     Status: None   Collection Time    04/20/13  9:15 PM      Result Value Ref Range Status   Specimen Description BLOOD RIGHT HAND   Final   Special Requests BOTTLES DRAWN AEROBIC ONLY 4CC   Final   Culture  Setup Time     Final   Value: 04/21/2013 13:08     Performed at [facility]   Culture     Final   Value:  BLOOD CULTURE RECEIVED NO GROWTH TO DATE CULTURE WILL BE HELD FOR 5 DAYS BEFORE ISSUING A FINAL NEGATIVE REPORT     Performed at [facility]   Report Status PENDING   Incomplete  CULTURE, BLOOD (ROUTINE X 2)     Status: None   Collection Time    04/20/13  9:26 PM      Result Value Ref Range Status   Specimen Description BLOOD LEFT HAND   Final   Special Requests BOTTLES DRAWN AEROBIC ONLY 3CC   Final   Culture  Setup Time     Final   Value: 04/21/2013 13:08     Performed at [facility]   Culture     Final   Value:        BLOOD CULTURE RECEIVED NO GROWTH TO DATE CULTURE WILL BE HELD FOR 5 DAYS BEFORE ISSUING A FINAL NEGATIVE REPORT     Performed at [facility]   Report Status PENDING   Incomplete  CLOSTRIDIUM  DIFFICILE BY PCR     Status: None   Collection Time    04/25/13  1:59 PM      Result Value Ref Range Status   C difficile by pcr NEGATIVE  NEGATIVE Final     Studies: Dg Chest Port 1 View  04/24/2013   CLINICAL DATA Cough and congestion.  EXAM PORTABLE CHEST - 1 VIEW  COMPARISON DG CHEST 2 VIEW dated 01/29/2013; CT CHEST W/CM dated 02/02/2012; DG CHEST 2 VIEW dated 02/01/2012  FINDINGS Mediastinum and hilar structures are normal. Increased interstitial markings noted. Pneumonitis could present in this fashion. Underlying COPD most likely present. Changes of pleural parenchymal scarring noted. Heart size and pulmonary vascularity normal. No pneumothorax. No acute osseous abnormality. Degenerative changes both shoulders and thoracic spine.  IMPRESSION 1. Mild increased interstitial markings suggesting the possibility of interstitial pneumonitis.  2. COPD.  SIGNATURE  Electronically Signed   By: Marcello Moores  Register   On: 04/24/2013 15:14   Dg Swallowing Func-speech Pathology  04/25/2013   Houston Siren, CCC-SLP     04/25/2013 10:47 AM Objective Swallowing Evaluation: Modified Barium Swallowing Study   Patient Details  Name: [name] MRN: [id number] Date of Birth: Jun 26, 1934  Today's Date: 04/25/2013 Time: 0865-7846 SLP Time Calculation (min): 20 min  Past Medical History:  Past Medical History  Diagnosis Date  . Hyperlipidemia   . History of colon polyps   . Spinal stenosis   . Anxiety   . BPH (benign prostatic hypertrophy)   . ED (erectile dysfunction)   . Osteoporosis   . Hypertension   . Chest pain     "I've had it" (01/31/2012)   . Peripheral vascular disease     "right leg is 100% blocked; left leg has stent, still ~ 75%  blocked" (01/31/2012)  . Peripheral neuropathy     "bad" (01/31/2012)  . COPD (chronic obstructive pulmonary disease)   . Emphysema   . Pneumonia 1960's    "double" (01/31/2012)  . BRBPR (bright red blood per rectum)     "don't know what it's from; had some this week" (01/31/2012)   . Cervical spondylosis   . Rheumatoid arthritis(714.0)   . Arthritis     "hands and feet" (01/31/2012)  . DDD (degenerative disc disease), lumbosacral     "S1; L5" (01/31/2012)  . Chronic lower back pain   . Depression     "imagine I've got a mild depression w/what I've gone thru last  month" (  01/31/2012)  . Prostate cancer 2004    "had 40 treatments of radiation" (01/31/2012)  . Breast cancer in male     "left" (01/31/2012)  . Basal cell carcinoma of nasal tip     "  . Pernicious anemia   . GERD (gastroesophageal reflux disease)   . Polyneuropathy in other diseases classified elsewhere 11/06/2012   . Lumbosacral spondylosis without myelopathy 11/06/2012   Past Surgical History:  Past Surgical History  Procedure Laterality Date  . Basal cell carcinoma excision      Nose x 3  . Cholecystectomy  ?1990's  . Wrist fusion      Right Wrist; "3 OR's; it's fused" (01/31/2012)  . Excisional hemorrhoidectomy  1960's  . Knee surgery      "right; X 6 surgeries; went in for simple cartilage OR; ended  up w/fused knee" (01/31/2012)  . Iliac artery stent  12-07-09    Stent done by Dr. Gwenlyn Found  . Total hip arthroplasty  08/16/2011    Procedure: TOTAL HIP ARTHROPLASTY ANTERIOR APPROACH;  Surgeon:  Mcarthur Rossetti, MD;  Location: [facility];  Service:  Orthopedics;  Laterality: Right;  Right total hip replacement  . Cervical disc surgery      "7 total; ended w/a fusion" (01/31/2012)  . Esophagogastroduodenoscopy  02/22/2012    Procedure: ESOPHAGOGASTRODUODENOSCOPY (EGD);  Surgeon: Arta Silence, MD;  Location: [facility];  Service: Endoscopy;   Laterality: N/A;  . Eus  02/22/2012    Procedure: UPPER ENDOSCOPIC ULTRASOUND (EUS) RADIAL;  Surgeon:  Arta Silence, MD;  Location: [facility];  Service: Endoscopy;   Laterality: N/A;  . Hernia repair    . Transurethral resection of prostate     HPI:  79 y/o male with chief complaint of right leg redness, sores and  pain.Dx Cellulitis with draining L. Leg wound and sepsis .   History of ACDF,  significant COPD/emphesema, cervical  spondylosis.  RN reports increased coughing with meds, swallow  eval ordered.  CXR  Mild increased interstitial markings  suggesting the possibility of interstitial pneumonitis.     Assessment / Plan / Recommendation Clinical Impression  Dysphagia Diagnosis: Mild oral phase dysphagia;Mild pharyngeal  phase dysphagia;Moderate pharyngeal phase dysphagia Clinical impression: Pt. exhibited mild oral impairment  characterized by prolonged mastication likely due to dentures not  present (lower plate broken).  Pharyngeal phase evidenced by  sensory deficits that is characteristic of pt.'s with COPD.   Decreased pharyngeal sensation led to late swallow initiating at  pyriform sinsues with flash laryngeal penetration of cup sip  thin.  Moderate amount of aspiration with straw sips thin given  increased velocity and sip size with reflexive cough.  SLP  recommending Dys 2 diet texture and thin liquids, NO straws,  pills whole in applesauce, small sips sitting upright and rest  breaks when needed.  ST will continue to follow.     Treatment Recommendation  Therapy as outlined in treatment plan below    Diet Recommendation Dysphagia 2 (Fine chop);Thin liquid   Liquid Administration via: No straw;Cup Medication Administration: Whole meds with puree Supervision: Patient able to self feed Compensations: Slow rate;Small sips/bites (rest breaks) Postural Changes and/or Swallow Maneuvers: Seated upright 90  degrees    Other  Recommendations Oral Care Recommendations: Oral care BID   Follow Up Recommendations  None    Frequency and Duration min 2x/week  1 week   Pertinent Vitals/Pain WDL            Reason for Referral  Objectively evaluate swallowing function   Oral Phase Oral Preparation/Oral Phase Oral Phase: Impaired Oral - Solids Oral - Regular: Delayed oral transit;Weak lingual manipulation   Pharyngeal Phase Pharyngeal Phase Pharyngeal Phase: Impaired Pharyngeal - Nectar Pharyngeal - Nectar  Teaspoon: Delayed swallow  initiation;Premature spillage to pyriform sinuses Pharyngeal - Nectar Cup: Delayed swallow initiation;Premature  spillage to pyriform sinuses Pharyngeal - Thin Pharyngeal - Thin Teaspoon: Delayed swallow initiation;Premature  spillage to pyriform sinuses Pharyngeal - Thin Cup: Delayed swallow initiation;Premature  spillage to pyriform sinuses;Penetration/Aspiration during  swallow Penetration/Aspiration details (thin cup): Material enters  airway, remains ABOVE vocal cords then ejected out Pharyngeal - Thin Straw: Penetration/Aspiration during  swallow;Delayed swallow initiation;Premature spillage to pyriform  sinuses;Moderate aspiration;Reduced airway/laryngeal closure Penetration/Aspiration details (thin straw): Material enters  airway, passes BELOW cords and not ejected out despite cough  attempt by patient Pharyngeal - Solids Pharyngeal - Regular: Within functional limits  Cervical Esophageal Phase    GO    Cervical Esophageal Phase Cervical Esophageal Phase: [facility]         [place] Mon.Ed CCC-SLP Pager [phone]  04/25/2013    Scheduled Meds: . ciprofloxacin  400 mg Intravenous Q12H  . diazepam  5 mg Oral Daily  . heparin  5,000 Units Subcutaneous 3 times per day  . mometasone-formoterol  2 puff Inhalation BID  . potassium chloride  40 mEq Oral Once  . prednisoLONE  5 mg Oral Q breakfast  . rosuvastatin  20 mg Oral q1800  . sertraline  50 mg Oral Daily  . tiotropium  18 mcg Inhalation Daily  . traZODone  50 mg Oral QHS  . vancomycin  750 mg Intravenous Q12H   Continuous Infusions: . sodium chloride 75 mL/hr at 04/25/13 2000   Time spent: 35 min  [facility] Pager [phone]. If 7PM-7AM, please contact night-coverage at [email or web address], password [facility] 04/25/2013, 9:23 PM  LOS: 5 days

## 2013-04-25 NOTE — Op Note (Signed)
OPERATIVE NOTE   PROCEDURE: 1.  Left common femoral artery cannulation under ultrasound guidance 2.  Placement of catheter in aorta 3.  Aortogram 4.  Bilateral leg runoff in stations  PRE-OPERATIVE DIAGNOSIS: Right calf ulcers, known right common iliac artery chronic occlusion  POST-OPERATIVE DIAGNOSIS: same as above   SURGEON: Adele Barthel, MD  ANESTHESIA: conscious sedation  ESTIMATED BLOOD LOSS: 50 cc  CONTRAST: 200 cc  FINDING(S):  Images somewhat degraded with residual barium  Aorta: heavily diseased with multiple penetrating ulcers in infrarenal segment, mid-segment stenosis ~50%  Superior mesenteric artery: patent Celiac artery: patent Inferior mesenteric artery: hypertrophied artery feeds right leg   Right Left  RA patent patent  CIA Occluded Patent proximally with patent common iliac artery in distal portion   EIA Patent patent  IIA Patent, reconstituted via pelvic branches which retrograde perfuse external iliac artery  Patent  CFA Patent Patent  SFA Patent with 50% mid-segment stenosis Patent with 50-75% stenosis in mid-segment  PFA Patent Patent  Pop Patent with 75% stenosis at knee joint Patent  Trif Patent Appears patent but poor filling due to delay in blood flow compared to right leg  AT Patent proximal, become miniscule distally Patent but small  Pero Patent Patent but small  PT Patent Patent but small   SPECIMEN(S):  none  INDICATIONS:   [name] is a 79 y.o. male who presents with ulcers in right distal leg.  The patient presents for: aortogram and bilateral leg runoff.  I discussed with the patient the nature of angiographic procedures, especially the limited patencies of any endovascular intervention.  The patient is aware of that the risks of an angiographic procedure include but are not limited to: bleeding, infection, access site complications, renal failure, embolization, rupture of vessel, dissection, possible need for emergent surgical  intervention, possible need for surgical procedures to treat the patient's pathology, and stroke and death.  The patient is aware of the risks and agrees to proceed.  DESCRIPTION: After full informed consent was obtained from the patient, the patient was brought back to the angiography suite.  The patient was placed supine upon the angiography table and connected to monitoring equipment.  The patient was then given conscious sedation, the amounts of which are documented in the patient's chart.  The patient was prepped and drape in the standard fashion for an angiographic procedure.  At this point, attention was turned to the left groin.  Under ultrasound guidance, the left common femoral artery was cannulated with a 18 gauge needle.  The [facility] wire was passed up into the aorta.  The needle was exchanged for a 5-Fr sheath, which was advanced over the wire into the common femoral artery.  The dilator was then removed.  The Omniflush catheter was then loaded over the wire up to the level of L1.  The catheter was connected to the power injector circuit.  After de-airring and de-clotting the circuit, a power injector aortogram was completed.  I rotated the image intensifer steep RAO and did another aortic injection.  I then rotated the image intensifier  Steep LAO and dd another aortic injection.  I then start the bilateral leg runoff in stations.  I had to a do an additional injection with a steep RAO at the level of the common femoral artery in order to roll the prosthetic hip off the femoral arteries.  The findings for these studies are listed above.  The sheath was aspirated.  No clots were present  and the sheath was reloaded with heparinized saline.    COMPLICATIONS: none  CONDITION: stable  Adele Barthel, MD [facility] Office: [phone] Pager: [phone]  04/25/2013, 12:33 PM

## 2013-04-26 DIAGNOSIS — E43 Unspecified severe protein-calorie malnutrition: Secondary | ICD-10-CM | POA: Diagnosis present

## 2013-04-26 DIAGNOSIS — K922 Gastrointestinal hemorrhage, unspecified: Secondary | ICD-10-CM | POA: Diagnosis present

## 2013-04-26 LAB — HEMOGLOBIN AND HEMATOCRIT, BLOOD
HCT: 34.4 % — ABNORMAL LOW (ref 39.0–52.0)
HEMATOCRIT: 32.3 % — AB (ref 39.0–52.0)
HEMOGLOBIN: 11.7 g/dL — AB (ref 13.0–17.0)
Hemoglobin: 12.2 g/dL — ABNORMAL LOW (ref 13.0–17.0)

## 2013-04-26 LAB — BASIC METABOLIC PANEL
BUN: 25 mg/dL — AB (ref 6–23)
CO2: 25 mEq/L (ref 19–32)
CREATININE: 0.7 mg/dL (ref 0.50–1.35)
Calcium: 8.4 mg/dL (ref 8.4–10.5)
Chloride: 105 mEq/L (ref 96–112)
GFR calc Af Amer: >90 mL/min (ref >90)
GFR calc non Af Amer: 89 mL/min — ABNORMAL LOW (ref >90)
GLUCOSE: 152 mg/dL — AB (ref 70–99)
Potassium: 2.7 mEq/L — CL (ref 3.7–5.3)
Sodium: 141 mEq/L (ref 137–147)

## 2013-04-26 LAB — VANCOMYCIN, TROUGH: Vancomycin Tr: 18.8 ug/mL (ref 10.0–20.0)

## 2013-04-26 LAB — CBC
HEMATOCRIT: 32.9 % — AB (ref 39.0–52.0)
Hemoglobin: 11.9 g/dL — ABNORMAL LOW (ref 13.0–17.0)
MCH: 32.5 pg (ref 26.0–34.0)
MCHC: 36.2 g/dL — AB (ref 30.0–36.0)
MCV: 89.9 fL (ref 78.0–100.0)
Platelets: 196 10*3/uL (ref 150–400)
RBC: 3.66 MIL/uL — ABNORMAL LOW (ref 4.22–5.81)
RDW: 13.5 % (ref 11.5–15.5)
WBC: 9.9 10*3/uL (ref 4.0–10.5)

## 2013-04-26 LAB — OCCULT BLOOD X 1 CARD TO LAB, STOOL: Fecal Occult Bld: POSITIVE — AB

## 2013-04-26 LAB — MAGNESIUM: Magnesium: 1.8 mg/dL (ref 1.5–2.5)

## 2013-04-26 MED ORDER — POTASSIUM CHLORIDE CRYS ER 20 MEQ PO TBCR
40.0000 meq | EXTENDED_RELEASE_TABLET | Freq: Once | ORAL | Status: AC
  Administered 2013-04-26: 09:00:00 40 meq via ORAL
  Filled 2013-04-26: qty 2

## 2013-04-26 MED ORDER — VANCOMYCIN HCL 10 G IV SOLR
1250.0000 mg | INTRAVENOUS | Status: DC
  Administered 2013-04-27 – 2013-05-05 (×9): 1250 mg via INTRAVENOUS
  Filled 2013-04-26 (×9): qty 1250

## 2013-04-26 MED ORDER — LOPERAMIDE HCL 2 MG PO CAPS
2.0000 mg | ORAL_CAPSULE | ORAL | Status: DC | PRN
  Administered 2013-04-26: 2 mg via ORAL
  Filled 2013-04-26: qty 1

## 2013-04-26 MED ORDER — POTASSIUM CHLORIDE IN NACL 40-0.9 MEQ/L-% IV SOLN
INTRAVENOUS | Status: DC
  Administered 2013-04-26 – 2013-04-27 (×2): 75 mL/h via INTRAVENOUS
  Administered 2013-04-27: 100 mL/h via INTRAVENOUS
  Administered 2013-04-28: 01:00:00 via INTRAVENOUS
  Administered 2013-04-28: 75 mL/h via INTRAVENOUS
  Administered 2013-04-29: 06:00:00 via INTRAVENOUS
  Filled 2013-04-26 (×7): qty 1000

## 2013-04-26 MED ORDER — POTASSIUM CHLORIDE 10 MEQ/100ML IV SOLN
10.0000 meq | INTRAVENOUS | Status: AC
  Administered 2013-04-26 (×4): 10 meq via INTRAVENOUS
  Filled 2013-04-26 (×4): qty 100

## 2013-04-26 MED ORDER — ENSURE COMPLETE PO LIQD
237.0000 mL | Freq: Three times a day (TID) | ORAL | Status: DC
  Administered 2013-04-26 – 2013-04-27 (×2): 237 mL via ORAL
  Filled 2013-04-26 (×7): qty 237

## 2013-04-26 MED ORDER — PANTOPRAZOLE SODIUM 40 MG IV SOLR
40.0000 mg | Freq: Two times a day (BID) | INTRAVENOUS | Status: DC
  Administered 2013-04-26 – 2013-04-28 (×4): 40 mg via INTRAVENOUS
  Filled 2013-04-26 (×5): qty 40

## 2013-04-26 NOTE — Progress Notes (Signed)
Patient ID: [name], male   DOB: [birth year], 79 y.o.   MRN: [id number]  Discussed the arteriogram with the patient. Does show a right iliac occlusion. Interestingly he has very rapid flow down to his right foot with collaterals. Fortunately he does have a patent superficial femoral artery on the right. I do not feel this is what caused his right foot cellulitis or ulcers but certainly will make it more difficult for this to heal. He currently is just had a large maroon bowel movement it does appear to be having GI bleed. We need to sort out the GI issues prior to a femoral to femoral bypass. I did discuss the procedure with the patient. Fortunately he does have normal inflow into his left groin so should be able to have a fem-fem bypass when more stable from a medical standpoint. The right leg is improving with elevation and antibiotics. We'll see again on Monday. These call my partner over the weekend if there are issues from a vascular standpoint

## 2013-04-26 NOTE — Progress Notes (Signed)
Utilization Review Completed [name] J. Alfonzia Woolum, RN, BSN, NCM [phone]  

## 2013-04-26 NOTE — Progress Notes (Signed)
Speech Language Pathology Treatment: Dysphagia  Patient Details Name: [name] MRN: [id number] DOB: 26-Feb-1934 Today's Date: 04/26/2013 Time: 7737-3668 SLP Time Calculation (min): 23 min  Assessment / Plan / Recommendation Clinical Impression  Pt agreeable to aspiration precautions, initially needed total assist to recall, but stated independently at end of session. Pt tolerated small cup sips with assist, but does continue to have a baseline cough. Continue current diet. Will f/u once more for further education.    HPI HPI: 79 y/o male with chief complaint of right leg redness, sores and pain.Dx Cellulitis with draining L. Leg wound and sepsis .  History of ACDF, significant COPD/emphesema, cervical spondylosis.  RN reports increased coughing with meds, swallow eval ordered.  CXR  Mild increased interstitial markings suggesting the possibility of interstitial pneumonitis.   Pertinent Vitals NA  SLP Plan  Continue with current plan of care    Recommendations Diet recommendations: Dysphagia 2 (fine chop);Thin liquid Liquids provided via: No straw;Cup Medication Administration: Whole meds with puree Supervision: Patient able to self feed Compensations: Slow rate;Small sips/bites Postural Changes and/or Swallow Maneuvers: Seated upright 90 degrees              Oral Care Recommendations: Oral care BID Follow up Recommendations: None Plan: Continue with current plan of care    GO    [facility], MA CCC-SLP [phone]  Lynann Beaver 04/26/2013, 9:50 AM

## 2013-04-26 NOTE — Progress Notes (Addendum)
Called by RN about "coffee ground stool"  [facility] PROGRESS NOTE  [name] VEL:[id number] DOB: July 28, 1934 DOA: 04/20/2013 PCP: Charles Huh, MD  Assessment/Plan: Reported bloody stool: per patient bright red or maroon.  On rectal: dark but not melanotic. Sent for hemoccult.  H/H, BP and HR ok. Will d/c [place] heparin. Bid protonix. Serial h/h. Has had EGD and colonoscopy by Dr. Paulita [name].  Will consult him.  Clears for now  1.Cellulitis with draining L. Leg wound with calf ulceration and sepsis syndrome Less red today. Continue vanc and cipro  PAD: per vascular  3.Hypokalemia Replace PO and IV  Diarrhea: c diff negative. Will order prn imodium  Cough: CXR shows nonspecific findings  Chronic dysphagia: modified diet per speech  COPD (severe, per patient): no wheeze  Debility: SNF once medically stable  Code Status: full Family Communication: son 3/12 Disposition Plan: SNF   Consultants:  Vascular surgery- Dr [name]  Procedures:  arteriogram  Antibiotics:  vanc started on 3/7   cipro 3/11  HPI/Subjective: Leg pain continues. Bloody loose stool. No vomiting. Some "lower" abdominal pain.  Objective: Filed Vitals:   04/26/13 1300  BP: 141/67  Pulse:   Temp:   Resp:     Intake/Output Summary (Last 24 hours) at 04/26/13 1647 Last data filed at 04/26/13 1603  Gross per 24 hour  Intake 3892.5 ml  Output   1300 ml  Net 2592.5 ml   Filed Weights   04/20/13 2012 04/26/13 0434  Weight: 56.5 kg (124 lb 9 oz) 60.4 kg (133 lb 2.5 oz)    Exam:  General: awake. Weak appearing.  Cardiovascular: RRR, nl S1 s2 Respiratory: CTA without WRR, wet cough occasionally Abdomen: S, NT, ND Rectal: dark brown liquid stool. Internal hemorroids. Sent for hemoccult Extremities: erythema less intense, less indurated. Tender.  Data Reviewed: Basic Metabolic Panel:  Recent Labs Lab 04/20/13 1750 04/21/13 0615 04/23/13 0730 04/24/13 0600 04/26/13 0603  NA  132* 137 140 138 141  K 3.9 4.0 3.4* 3.4* 2.7*  CL 88* 95* 100 102 105  CO2 27 25 22  16* 25  GLUCOSE 151* 94 88 90 152*  BUN 26* 27* 24* 24* 25*  CREATININE 1.23 1.26 0.81 0.71 0.70  CALCIUM 9.9 9.0 9.0 8.8 8.4  MG  --   --   --   --  1.8   Liver Function Tests:  Recent Labs Lab 04/21/13 0615  AST 44*  ALT 10  ALKPHOS 79  BILITOT 1.2  PROT 5.9*  ALBUMIN 2.5*   No results found for this basename: LIPASE, AMYLASE,  in the last 168 hours No results found for this basename: AMMONIA,  in the last 168 hours CBC:  Recent Labs Lab 04/20/13 1750 04/21/13 0615 04/23/13 0730 04/24/13 0600 04/26/13 0603 04/26/13 1400  WBC 14.6* 12.2* 14.2* 12.2* 9.9  --   HGB 15.6 13.5 13.3 13.8 11.9* 12.2*  HCT 42.9 38.4* 37.4* 39.3 32.9* 34.4*  MCV 94.7 94.8 93.5 93.1 89.9  --   PLT 127* 98* 129* 151 196  --    Cardiac Enzymes: No results found for this basename: CKTOTAL, CKMB, CKMBINDEX, TROPONINI,  in the last 168 hours BNP (last 3 results) No results found for this basename: PROBNP,  in the last 8760 hours CBG: No results found for this basename: GLUCAP,  in the last 168 hours  Recent Results (from the past 240 hour(s))  CULTURE, BLOOD (ROUTINE X 2)     Status: None   Collection Time  04/20/13  9:15 PM      Result Value Ref Range Status   Specimen Description BLOOD RIGHT HAND   Final   Special Requests BOTTLES DRAWN AEROBIC ONLY 4CC   Final   Culture  Setup Time     Final   Value: 04/21/2013 13:08     Performed at [facility]   Culture     Final   Value:        BLOOD CULTURE RECEIVED NO GROWTH TO DATE CULTURE WILL BE HELD FOR 5 DAYS BEFORE ISSUING A FINAL NEGATIVE REPORT     Performed at [facility]   Report Status PENDING   Incomplete  CULTURE, BLOOD (ROUTINE X 2)     Status: None   Collection Time    04/20/13  9:26 PM      Result Value Ref Range Status   Specimen Description BLOOD LEFT HAND   Final   Special Requests BOTTLES DRAWN AEROBIC ONLY 3CC    Final   Culture  Setup Time     Final   Value: 04/21/2013 13:08     Performed at [facility]   Culture     Final   Value:        BLOOD CULTURE RECEIVED NO GROWTH TO DATE CULTURE WILL BE HELD FOR 5 DAYS BEFORE ISSUING A FINAL NEGATIVE REPORT     Performed at [facility]   Report Status PENDING   Incomplete  CLOSTRIDIUM DIFFICILE BY PCR     Status: None   Collection Time    04/25/13  1:59 PM      Result Value Ref Range Status   C difficile by pcr NEGATIVE  NEGATIVE Final     Studies: Dg Swallowing Func-speech Pathology  04/25/2013   [name], CCC-SLP     04/25/2013 10:47 AM Objective Swallowing Evaluation: Modified Barium Swallowing Study   Patient Details  Name: [name] MRN: [id number] Date of Birth: 11-Sep-1934  Today's Date: 04/25/2013 Time: 0093-8182 SLP Time Calculation (min): 20 min  Past Medical History:  Past Medical History  Diagnosis Date  . Hyperlipidemia   . History of colon polyps   . Spinal stenosis   . Anxiety   . BPH (benign prostatic hypertrophy)   . ED (erectile dysfunction)   . Osteoporosis   . Hypertension   . Chest pain     "I've had it" (01/31/2012)   . Peripheral vascular disease     "right leg is 100% blocked; left leg has stent, still ~ 75%  blocked" (01/31/2012)  . Peripheral neuropathy     "bad" (01/31/2012)  . COPD (chronic obstructive pulmonary disease)   . Emphysema   . Pneumonia 1960's    "double" (01/31/2012)  . BRBPR (bright red blood per rectum)     "don't know what it's from; had some this week" (01/31/2012)  . Cervical spondylosis   . Rheumatoid arthritis(714.0)   . Arthritis     "hands and feet" (01/31/2012)  . DDD (degenerative disc disease), lumbosacral     "S1; L5" (01/31/2012)  . Chronic lower back pain   . Depression     "imagine I've got a mild depression w/what I've gone thru last  month" (01/31/2012)  . Prostate cancer 2004    "had 40 treatments of radiation" (01/31/2012)  . Breast cancer in male     "left" (01/31/2012)  . Basal  cell carcinoma of nasal tip     "  . Pernicious  anemia   . GERD (gastroesophageal reflux disease)   . Polyneuropathy in other diseases classified elsewhere 11/06/2012   . Lumbosacral spondylosis without myelopathy 11/06/2012   Past Surgical History:  Past Surgical History  Procedure Laterality Date  . Basal cell carcinoma excision      Nose x 3  . Cholecystectomy  ?1990's  . Wrist fusion      Right Wrist; "3 OR's; it's fused" (01/31/2012)  . Excisional hemorrhoidectomy  1960's  . Knee surgery      "right; X 6 surgeries; went in for simple cartilage OR; ended  up w/fused knee" (01/31/2012)  . Iliac artery stent  12-07-09    Stent done by Dr. Gwenlyn Found  . Total hip arthroplasty  08/16/2011    Procedure: TOTAL HIP ARTHROPLASTY ANTERIOR APPROACH;  Surgeon:  Mcarthur Rossetti, MD;  Location: [facility];  Service:  Orthopedics;  Laterality: Right;  Right total hip replacement  . Cervical disc surgery      "7 total; ended w/a fusion" (01/31/2012)  . Esophagogastroduodenoscopy  02/22/2012    Procedure: ESOPHAGOGASTRODUODENOSCOPY (EGD);  Surgeon: Arta Silence, MD;  Location: [facility];  Service: Endoscopy;   Laterality: N/A;  . Eus  02/22/2012    Procedure: UPPER ENDOSCOPIC ULTRASOUND (EUS) RADIAL;  Surgeon:  Arta Silence, MD;  Location: [facility];  Service: Endoscopy;   Laterality: N/A;  . Hernia repair    . Transurethral resection of prostate     HPI:  79 y/o male with chief complaint of right leg redness, sores and  pain.Dx Cellulitis with draining L. Leg wound and sepsis .   History of ACDF, significant COPD/emphesema, cervical  spondylosis.  RN reports increased coughing with meds, swallow  eval ordered.  CXR  Mild increased interstitial markings  suggesting the possibility of interstitial pneumonitis.     Assessment / Plan / Recommendation Clinical Impression  Dysphagia Diagnosis: Mild oral phase dysphagia;Mild pharyngeal  phase dysphagia;Moderate pharyngeal phase dysphagia Clinical impression: Pt. exhibited mild oral  impairment  characterized by prolonged mastication likely due to dentures not  present (lower plate broken).  Pharyngeal phase evidenced by  sensory deficits that is characteristic of pt.'s with COPD.   Decreased pharyngeal sensation led to late swallow initiating at  pyriform sinsues with flash laryngeal penetration of cup sip  thin.  Moderate amount of aspiration with straw sips thin given  increased velocity and sip size with reflexive cough.  SLP  recommending Dys 2 diet texture and thin liquids, NO straws,  pills whole in applesauce, small sips sitting upright and rest  breaks when needed.  ST will continue to follow.     Treatment Recommendation  Therapy as outlined in treatment plan below    Diet Recommendation Dysphagia 2 (Fine chop);Thin liquid   Liquid Administration via: No straw;Cup Medication Administration: Whole meds with puree Supervision: Patient able to self feed Compensations: Slow rate;Small sips/bites (rest breaks) Postural Changes and/or Swallow Maneuvers: Seated upright 90  degrees    Other  Recommendations Oral Care Recommendations: Oral care BID   Follow Up Recommendations  None    Frequency and Duration min 2x/week  1 week   Pertinent Vitals/Pain WDL            Reason for Referral Objectively evaluate swallowing function   Oral Phase Oral Preparation/Oral Phase Oral Phase: Impaired Oral - Solids Oral - Regular: Delayed oral transit;Weak lingual manipulation   Pharyngeal Phase Pharyngeal Phase Pharyngeal Phase: Impaired Pharyngeal - Nectar Pharyngeal - Nectar Teaspoon: Delayed swallow  initiation;Premature spillage to pyriform sinuses Pharyngeal - Nectar Cup: Delayed swallow initiation;Premature  spillage to pyriform sinuses Pharyngeal - Thin Pharyngeal - Thin Teaspoon: Delayed swallow initiation;Premature  spillage to pyriform sinuses Pharyngeal - Thin Cup: Delayed swallow initiation;Premature  spillage to pyriform sinuses;Penetration/Aspiration during  swallow Penetration/Aspiration  details (thin cup): Material enters  airway, remains ABOVE vocal cords then ejected out Pharyngeal - Thin Straw: Penetration/Aspiration during  swallow;Delayed swallow initiation;Premature spillage to pyriform  sinuses;Moderate aspiration;Reduced airway/laryngeal closure Penetration/Aspiration details (thin straw): Material enters  airway, passes BELOW cords and not ejected out despite cough  attempt by patient Pharyngeal - Solids Pharyngeal - Regular: Within functional limits  Cervical Esophageal Phase    GO    Cervical Esophageal Phase Cervical Esophageal Phase: [facility]         Cranford Mon.Ed CCC-SLP Pager [phone]  04/25/2013    Scheduled Meds: . ciprofloxacin  400 mg Intravenous Q12H  . diazepam  2 mg Oral Daily  . feeding supplement (ENSURE COMPLETE)  237 mL Oral TID BM  . mometasone-formoterol  2 puff Inhalation BID  . pantoprazole (PROTONIX) IV  40 mg Intravenous Q12H  . potassium chloride  40 mEq Oral Once  . prednisoLONE  5 mg Oral Q breakfast  . rosuvastatin  20 mg Oral q1800  . sertraline  50 mg Oral Daily  . tiotropium  18 mcg Inhalation Daily  . traZODone  50 mg Oral QHS  . vancomycin  750 mg Intravenous Q12H   Continuous Infusions: . sodium chloride 75 mL/hr at 04/26/13 0700   Time spent: 35 min  [facility] Pager [phone]. If 7PM-7AM, please contact night-coverage at [email or web address], password [facility] 04/26/2013, 4:47 PM  LOS: 6 days

## 2013-04-26 NOTE — Progress Notes (Signed)
ANTIBIOTIC CONSULT NOTE - Follow up Pharmacy Consult for vancomycin Indication: cellulitis  Allergies  Allergen Reactions  . Lipitor [Atorvastatin Calcium] Other (See Comments)    Myalgias "don't remember how bad" (01/31/2012)  . Penicillins Other (See Comments)    Passed out     Patient Measurements: Height: 5' 8.9" (175 cm) Weight: 124 lb 9 oz (56.5 kg) IBW/kg (Calculated) : 70.47  Vital Signs: Temp: 99.5 F (37.5 C) (03/10 1330) Temp src: Oral (03/10 1330) BP: 145/58 mmHg (03/10 1330) Pulse Rate: 95 (03/10 1330) Intake/Output from previous day: 03/09 0701 - 03/10 0700 In: 0  Out: 500 [Urine:500] Intake/Output from this shift: Total I/O In: 240 [P.O.:240] Out: 1 [Stool:1   Labs:  Recent Labs  04/20/13 1750 04/21/13 0615 04/23/13 0730  WBC 14.6* 12.2* 14.2*  HGB 15.6 13.5 13.3  PLT 127* 98* 129*  CREATININE 1.23 1.26 0.81   Estimated Creatinine Clearance: 62 ml/min (by C-G formula based on Cr of 0.81). No results found for this basename: VANCOTROUGH, VANCOPEAK, VANCORANDOM, GENTTROUGH, GENTPEAK, GENTRANDOM, TOBRATROUGH, TOBRAPEAK, TOBRARND, AMIKACINPEAK, AMIKACINTROU, AMIKACIN,  in the last 72 hours   Assessment: 79 year old male on Day #7 IV vancomycin and D#3 IV cipro for R lower extremity cellulitis with draining R. S/p aortogram 3/12. Still intensely erythematous. Will continue abx. afeb, wbc 12.2 > 9.9, renal function stable, est. crcl ~ 65 ml/min  VANC 3/7> >  CIPRO 3/11 >>  3/10 VT 6.4 (drawn a little late) on 750 Q 24 > increased to 750 Q 12  3/7 BCx x 2 ngtd 3/12 C-diff PCR - neg  Goal of Therapy:  Vancomycin trough level 10-15 mcg/ml  Plan:  Continue Vancomycin 750 mg IV q12h Vancomycin trough tonight at 19:30 Follow clinical course and LOT  Maryanna Shape, PharmD, BCPS  Clinical Pharmacist  Pager: [phone]   04/26/2013 11:34 AM

## 2013-04-26 NOTE — Evaluation (Addendum)
Physical Therapy Evaluation Patient Details Name: [name] MRN: [id number] DOB: [birth year] Today's Date: 04/26/2013 Time: 0086-7619 PT Time Calculation (min): 23 min  PT Assessment / Plan / Recommendation History of Present Illness  [name] is a 79 y.o. male who presents to the ED with RLE swelling and erythema x 1 week.  Progressively worsening.  Worse with movement.  There are ulcers present around his ankle.  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 Pt with fused right knee also impacting mobility. Pt with decreased strength, function and activity tolerance unable to care for himself at home who will benefit from acute therapy to maximize mobility, independence and decrease burden of care. Pt incontinent of stool on arrival, unaware and total assist for pericare. Pt positioned in chair position in bed end of session with bil LE elevated. Will continue to follow and recommend lift for OOB currently.     PT Assessment  Patient needs continued PT services    Follow Up Recommendations  SNF;Supervision/Assistance - 24 hour    Does the patient have the potential to tolerate intense rehabilitation      Barriers to Discharge Decreased caregiver support      Equipment Recommendations       Recommendations for Other Services OT consult   Frequency Min 3X/week    Precautions / Restrictions Precautions Precautions: Fall Precaution Comments: right knee fused   Pertinent Vitals/Pain 4-6/10 RLE increased with touch, premedicated HR 94      Mobility  Bed Mobility Overal bed mobility: Needs Assistance Bed Mobility: Rolling;Sidelying to Sit;Sit to Supine Rolling: Max assist Sidelying to sit: Max assist Sit to supine: Max assist General bed mobility comments: cueing for use of rail, assist to rotate pelvis and elevate trunk from surface.  With return to supine assist of bil LE onto surface and 2 person max assist to scoot to [facility] Transfers Overall transfer level: Needs assistance Equipment used: Rolling walker (2 wheeled) Transfers: Sit to/from Stand Sit to Stand: From elevated surface;Mod assist General transfer comment: pt mod assist with weight on LLE to stand with assist of Rail and stabilized RW. Pt unable to bear weight on RLE, extend trunk in standing or pivot to chair. Pt stood grossly 20 sec prior to return to sitting    Exercises     PT Diagnosis: Difficulty walking;Generalized weakness;Acute pain  PT Problem List: Decreased strength;Decreased range of motion;Decreased knowledge of use of DME;Decreased activity tolerance;Decreased skin integrity;Decreased balance;Pain;Decreased mobility PT Treatment Interventions: DME instruction;Gait training;Balance training;Functional mobility training;Therapeutic activities;Therapeutic exercise;Patient/family education     PT Goals(Current goals can be found in the care plan section) Acute Rehab PT Goals Patient Stated Goal: be able to walk again PT Goal Formulation: With patient Time For Goal Achievement: 05/10/13 Potential to Achieve Goals: Fair  Visit Information  Last PT Received On: 04/26/13 Assistance Needed: +2 History of Present Illness: [name] is a 79 y.o. male who presents to the ED with RLE swelling and erythema x 1 week.  Progressively worsening.  Worse with movement.  There are ulcers present around his ankle.  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       Prior Functioning  Home Living Family/patient expects to be discharged to:: Skilled nursing facility Living Arrangements: Alone Available Help at Discharge:  Family;Available PRN/intermittently Type of Home: House Home Access: Level entry Home Layout: One level Home Equipment: Walker - 2 wheels Prior Function Level of Independence: Independent  with assistive device(s) Comments: pt states he has been sleeping in lift chair, sponge bathing and having increasing difficulty caring for himself at home Communication Communication: No difficulties    Cognition  Cognition Arousal/Alertness: Awake/alert Behavior During Therapy: WFL for tasks assessed/performed Overall Cognitive Status: Impaired/Different from baseline Area of Impairment: Safety/judgement Safety/Judgement: Decreased awareness of deficits General Comments: pt incontinent of stool and unaware.     Extremity/Trunk Assessment Upper Extremity Assessment Upper Extremity Assessment: Generalized weakness Lower Extremity Assessment Lower Extremity Assessment: Generalized weakness;RLE deficits/detail;LLE deficits/detail RLE Deficits / Details: right knee fused with hip strength grossly 2-/5 ROM limited by fusion and pain RLE: Unable to fully assess due to pain RLE Sensation: history of peripheral neuropathy LLE Deficits / Details: grossly 2+/5 Cervical / Trunk Assessment Cervical / Trunk Assessment: Normal   Balance Balance Overall balance assessment: Needs assistance Sitting-balance support: Feet supported;Bilateral upper extremity supported Sitting balance-Leahy Scale: Poor Standing balance-Leahy Scale: Zero  End of Session PT - End of Session Equipment Utilized During Treatment: Gait belt Activity Tolerance: Patient limited by pain Patient left: in bed;with nursing/sitter in room;with call bell/phone within reach Nurse Communication: Need for lift equipment;Mobility status;Precautions  GP     [name] [facility] 04/26/2013, 10:27 AM Elwyn Reach, [place]

## 2013-04-26 NOTE — Progress Notes (Signed)
INITIAL NUTRITION ASSESSMENT  DOCUMENTATION CODES Per approved criteria  -Severe malnutrition in the context of chronic illness -Underweight   INTERVENTION:  1.  Recommend consider appetite stimulant.   2.  Ensure Complete po TID, each supplement provides 350 kcal and 13 grams of protein  NUTRITION DIAGNOSIS: Malnutrition related to chronic illnes as evidenced by severe fat and muscle wasting and po intake </= 75% of his needs for >/= 1 month.   Goal: Pt to meet >/= 90% of their estimated nutrition needs   Monitor:  Diet advancement, PO intake, supplement acceptance, weight trend, labs  Reason for Assessment: Pt identified as at nutrition risk on the Malnutrition Screen Tool  79 y.o. male  Admitting Dx: <principal problem not specified>  ASSESSMENT: Pt admitted with RLE cellulitis getting progressively worse at home, pt with hx of right common iliac artery occlusion.  Pt lives alone and will need rehab after d/c.  Pt with hypokalemia and is on supplemental potassium.   Per pt he was in rehab about a year ago and was told that he could not swallow well and needed to be on a pureed diet. Pt has not been eating Breakfast or Lunch due to a poor appetite and pain. Pt has only ate mashed potatoes and yams for dinner for the last year since he lives alone and did not know what else to eat. Pt is willing to drink ensure but does not think he can eat due to very poor appetite. Pt refused Breakfast this am.   Nutrition Focused Physical Exam:  Subcutaneous Fat:  Orbital Region: WNL Upper Arm Region: severe wasting Thoracic and Lumbar Region: severe wasting  Muscle:  Temple Region: severe wasting Clavicle Bone Region: severe wasting Clavicle and Acromion Bone Region: severe wasting Scapular Bone Region: severe wasting Dorsal Hand: moderate wasting Patellar Region: severe wasting Anterior Thigh Region: severe wasting Posterior Calf Region: severe wasting  Edema: present -  cellulitis   Height: Ht Readings from Last 1 Encounters:  04/20/13 5' 8.9" (1.75 m)    Weight: Wt Readings from Last 1 Encounters:  04/26/13 133 lb 2.5 oz (60.4 kg)  Admission weight 124 lb (56.5 kg) Current weight likely elevated due to edema.   Ideal Body Weight: 72.7 kg   % Ideal Body Weight: 78%  Wt Readings from Last 10 Encounters:  04/26/13 133 lb 2.5 oz (60.4 kg)  04/26/13 133 lb 2.5 oz (60.4 kg)  04/26/13 133 lb 2.5 oz (60.4 kg)  01/29/13 124 lb 9.6 oz (56.518 kg)  01/23/13 124 lb 6.4 oz (56.427 kg)  11/06/12 121 lb (54.885 kg)  04/27/12 117 lb 8 oz (53.298 kg)  09/13/12 119 lb 12.8 oz (54.341 kg)  08/02/12 121 lb (54.885 kg)  06/25/12 116 lb (52.617 kg)    Usual Body Weight: 150 lb -  1 year ago  % Usual Body Weight: 83%  BMI:  18.3 - on admission   Estimated Nutritional Needs: Kcal: 1600-1800 Protein: 80-90 grams Fluid: > 1.6 L/day  Skin:  Cellulitis Wound on right mid calf  Diet Order: Dysphagia 2 with Thin Liquids Meal Completion: 0-25%  EDUCATION NEEDS: -No education needs identified at this time   Intake/Output Summary (Last 24 hours) at 04/26/13 1020 Last data filed at 04/26/13 0900  Gross per 24 hour  Intake 4022.5 ml  Output    900 ml  Net 3122.5 ml    Last BM: 3/13   Labs:   Recent Labs Lab 04/23/13 0730 04/24/13 0600 04/26/13 0603  NA 140 138 141  K 3.4* 3.4* 2.7*  CL 100 102 105  CO2 22 16* 25  BUN 24* 24* 25*  CREATININE 0.81 0.71 0.70  CALCIUM 9.0 8.8 8.4  MG  --   --  1.8  GLUCOSE 88 90 152*    CBG (last 3)  No results found for this basename: GLUCAP,  in the last 72 hours  Scheduled Meds: . ciprofloxacin  400 mg Intravenous Q12H  . diazepam  2 mg Oral Daily  . heparin  5,000 Units Subcutaneous 3 times per day  . mometasone-formoterol  2 puff Inhalation BID  . potassium chloride  10 mEq Intravenous Q1 Hr x 4  . potassium chloride  40 mEq Oral Once  . prednisoLONE  5 mg Oral Q breakfast  . rosuvastatin   20 mg Oral q1800  . sertraline  50 mg Oral Daily  . tiotropium  18 mcg Inhalation Daily  . traZODone  50 mg Oral QHS  . vancomycin  750 mg Intravenous Q12H    Continuous Infusions: . sodium chloride 75 mL/hr at 04/26/13 0700    Past Medical History  Diagnosis Date  . Hyperlipidemia   . History of colon polyps   . Spinal stenosis   . Anxiety   . BPH (benign prostatic hypertrophy)   . ED (erectile dysfunction)   . Osteoporosis   . Hypertension   . Chest pain     "I've had it" (01/31/2012)   . Peripheral vascular disease     "right leg is 100% blocked; left leg has stent, still ~ 75% blocked" (01/31/2012)  . Peripheral neuropathy     "bad" (01/31/2012)  . COPD (chronic obstructive pulmonary disease)   . Emphysema   . BRBPR (bright red blood per rectum)     "don't know what it's from; had some this week" (01/31/2012)  . Chronic lower back pain   . Pernicious anemia   . GERD (gastroesophageal reflux disease)   . Polyneuropathy in other diseases classified elsewhere 11/06/2012  . Prostate cancer 2004    "had 40 treatments of radiation" (01/31/2012)  . Breast cancer in male     "left" (01/31/2012)  . Basal cell carcinoma of nasal tip   . Pneumonia 1960's    "double"   . Cervical spondylosis   . Rheumatoid arthritis(714.0)   . Arthritis     "all over my body" (04/25/2013)  . DDD (degenerative disc disease), lumbosacral     "S1; L5" (01/31/2012)  . Lumbosacral spondylosis without myelopathy 11/06/2012  . Cellulitis of right leg   . Depression     "won't take RX though" (04/25/2013)  . Falls frequently     Past Surgical History  Procedure Laterality Date  . Basal cell carcinoma excision      Nose x 3  . Cholecystectomy  ?1990's  . Wrist fusion Right     "3 OR's; it's fused" (01/31/2012)  . Excisional hemorrhoidectomy  1960's  . Knee surgery Right      6 surgeries; went in for simple cartilage OR; ended up w/fused knee" (01/31/2012)  . Iliac artery stent  12-07-09     Stent done by Dr. Gwenlyn Found  . Total hip arthroplasty  08/16/2011    Procedure: TOTAL HIP ARTHROPLASTY ANTERIOR APPROACH;  Surgeon: Mcarthur Rossetti, MD;  Location: [facility];  Service: Orthopedics;  Laterality: Right;  Right total hip replacement  . Cervical disc surgery      "7 total; ended w/a fusion" (01/31/2012)  .  Esophagogastroduodenoscopy  02/22/2012    Procedure: ESOPHAGOGASTRODUODENOSCOPY (EGD);  Surgeon: Arta Silence, MD;  Location: [facility];  Service: Endoscopy;  Laterality: N/A;  . Eus  02/22/2012    Procedure: UPPER ENDOSCOPIC ULTRASOUND (EUS) RADIAL;  Surgeon: Arta Silence, MD;  Location: [facility];  Service: Endoscopy;  Laterality: N/A;  . Hernia repair    . Transurethral resection of prostate    . Aortogram  04/25/2013    [place], [place], [place] Pager [phone] After Hours Pager

## 2013-04-26 NOTE — Progress Notes (Signed)
ANTIBIOTIC CONSULT NOTE - Follow up Pharmacy Consult for vancomycin Indication: cellulitis  Allergies  Allergen Reactions  . Lipitor [Atorvastatin Calcium] Other (See Comments)    Myalgias "don't remember how bad" (01/31/2012)  . Penicillins Other (See Comments)    Passed out     Patient Measurements: Height: 5' 8.9" (175 cm) Weight: 124 lb 9 oz (56.5 kg) IBW/kg (Calculated) : 70.47  Vital Signs: Temp: 99.5 F (37.5 C) (03/10 1330) Temp src: Oral (03/10 1330) BP: 145/58 mmHg (03/10 1330) Pulse Rate: 95 (03/10 1330) Intake/Output from previous day: 03/09 0701 - 03/10 0700 In: 0  Out: 500 [Urine:500] Intake/Output from this shift: Total I/O In: 240 [P.O.:240] Out: 1 [Stool:1   Labs:  Recent Labs  04/20/13 1750 04/21/13 0615 04/23/13 0730  WBC 14.6* 12.2* 14.2*  HGB 15.6 13.5 13.3  PLT 127* 98* 129*  CREATININE 1.23 1.26 0.81   Estimated Creatinine Clearance: 62 ml/min (by C-G formula based on Cr of 0.81). No results found for this basename: VANCOTROUGH, VANCOPEAK, VANCORANDOM, GENTTROUGH, GENTPEAK, GENTRANDOM, TOBRATROUGH, TOBRAPEAK, TOBRARND, AMIKACINPEAK, AMIKACINTROU, AMIKACIN,  in the last 72 hours   Assessment: 79 year old male on Day #7 IV vancomycin and D#3 IV cipro for R lower extremity cellulitis with draining R. S/p aortogram 3/12. Still intensely erythematous. Will continue abx. afeb, wbc 12.2 > 9.9, renal function stable, est. crcl ~ 65 ml/min  VANC 3/7> >  CIPRO 3/11 >>  3/10 VT 6.4 (drawn a little late) on 750 Q 24 > increased to 750 Q 12  3/7 BCx x 2 ngtd 3/12 C-diff PCR - neg  Goal of Therapy:  Vancomycin trough level 10-15 mcg/ml  Plan:  Continue Vancomycin 750 mg IV q12h Vancomycin trough tonight at 19:30 Follow clinical course and LOT  Maryanna Shape, PharmD, BCPS  Clinical Pharmacist  Pager: [phone]   04/26/2013 8:32 PM   Addendum:  Vancomycin trough is 18.8 which is above the goal for cellulitis.  Will change dose to 1250mg  IV  daily.  Heide Guile, PharmD, BCPS Clinical Pharmacist Pager [phone]

## 2013-04-27 ENCOUNTER — Encounter (HOSPITAL_COMMUNITY)
Admission: EM | Disposition: A | Discharge status: Skilled Nursing Facility | Payer: Self-pay | Source: Home / Self Care | Attending: Vascular Surgery

## 2013-04-27 ENCOUNTER — Encounter (HOSPITAL_COMMUNITY): Payer: Self-pay

## 2013-04-27 DIAGNOSIS — D62 Acute posthemorrhagic anemia: Secondary | ICD-10-CM

## 2013-04-27 DIAGNOSIS — D519 Vitamin B12 deficiency anemia, unspecified: Secondary | ICD-10-CM | POA: Diagnosis not present

## 2013-04-27 DIAGNOSIS — M069 Rheumatoid arthritis, unspecified: Secondary | ICD-10-CM

## 2013-04-27 DIAGNOSIS — E43 Unspecified severe protein-calorie malnutrition: Secondary | ICD-10-CM

## 2013-04-27 HISTORY — PX: ESOPHAGOGASTRODUODENOSCOPY: SHX5428

## 2013-04-27 LAB — BASIC METABOLIC PANEL
BUN: 26 mg/dL — ABNORMAL HIGH (ref 6–23)
CHLORIDE: 103 meq/L (ref 96–112)
CO2: 24 meq/L (ref 19–32)
Calcium: 7.9 mg/dL — ABNORMAL LOW (ref 8.4–10.5)
Creatinine, Ser: 0.83 mg/dL (ref 0.50–1.35)
GFR calc Af Amer: >90 mL/min (ref >90)
GFR calc non Af Amer: 83 mL/min — ABNORMAL LOW (ref >90)
Glucose, Bld: 127 mg/dL — ABNORMAL HIGH (ref 70–99)
Potassium: 3.4 mEq/L — ABNORMAL LOW (ref 3.7–5.3)
SODIUM: 137 meq/L (ref 137–147)

## 2013-04-27 LAB — HEMOGLOBIN AND HEMATOCRIT, BLOOD
HCT: 26.8 % — ABNORMAL LOW (ref 39.0–52.0)
HCT: 24.2 % — ABNORMAL LOW (ref 39.0–52.0)
HEMATOCRIT: 24.4 % — AB (ref 39.0–52.0)
HEMOGLOBIN: 8.8 g/dL — AB (ref 13.0–17.0)
Hemoglobin: 9.7 g/dL — ABNORMAL LOW (ref 13.0–17.0)
Hemoglobin: 8.7 g/dL — ABNORMAL LOW (ref 13.0–17.0)

## 2013-04-27 LAB — CULTURE, BLOOD (ROUTINE X 2)
Culture: NO GROWTH
Culture: NO GROWTH

## 2013-04-27 SURGERY — EGD (ESOPHAGOGASTRODUODENOSCOPY)
Anesthesia: Moderate Sedation

## 2013-04-27 SURGERY — EGD (ESOPHAGOGASTRODUODENOSCOPY)
Anesthesia: Moderate Sedation | Laterality: Left

## 2013-04-27 MED ORDER — FENTANYL CITRATE 0.05 MG/ML IJ SOLN
INTRAMUSCULAR | Status: DC | PRN
  Administered 2013-04-27 (×2): 25 ug via INTRAVENOUS

## 2013-04-27 MED ORDER — DIAZEPAM 2 MG PO TABS
2.0000 mg | ORAL_TABLET | Freq: Every day | ORAL | Status: DC
  Administered 2013-04-28 – 2013-05-06 (×9): 2 mg via ORAL
  Filled 2013-04-27 (×9): qty 1

## 2013-04-27 MED ORDER — BUTAMBEN-TETRACAINE-BENZOCAINE 2-2-14 % EX AERO
INHALATION_SPRAY | CUTANEOUS | Status: DC | PRN
  Administered 2013-04-27: 2 via TOPICAL

## 2013-04-27 MED ORDER — MIDAZOLAM HCL 5 MG/ML IJ SOLN
INTRAMUSCULAR | Status: AC
  Filled 2013-04-27: qty 2

## 2013-04-27 MED ORDER — FENTANYL CITRATE 0.05 MG/ML IJ SOLN
INTRAMUSCULAR | Status: AC
  Filled 2013-04-27: qty 4

## 2013-04-27 MED ORDER — MIDAZOLAM HCL 10 MG/2ML IJ SOLN
INTRAMUSCULAR | Status: DC | PRN
  Administered 2013-04-27 (×2): 2 mg via INTRAVENOUS

## 2013-04-27 NOTE — Interval H&P Note (Signed)
History and Physical Interval Note:  04/27/2013 3:32 PM  [name]  has presented today for surgery, with the diagnosis of blood in stool, acute blood loss anemia  The various methods of treatment have been discussed with the patient and family. After consideration of risks, benefits and other options for treatment, the patient has consented to  Procedure(s): ESOPHAGOGASTRODUODENOSCOPY (EGD) (Left) as a surgical intervention .  The patient's history has been reviewed, patient examined, no change in status, stable for surgery.  I have reviewed the patient's chart and labs.  Questions were answered to the patient's satisfaction.     [name] M  Assessment:  1.  Blood in stool. 2.  Acute blood loss anemia. 3.  Abdominal pain.  Plan:  1.  Endoscopy. 2.  Risks (bleeding, infection, bowel perforation that could require surgery, sedation-related changes in cardiopulmonary systems), benefits (identification and possible treatment of source of symptoms, exclusion of certain causes of symptoms), and alternatives (watchful waiting, radiographic imaging studies, empiric medical treatment) of upper endoscopy (EGD) were explained to patient/family in detail and patient wishes to proceed.

## 2013-04-27 NOTE — Op Note (Signed)
[facility] [place], [id number]   ENDOSCOPY PROCEDURE REPORT  PATIENT: [name], [name]  MR#: [id number] BIRTHDATE: 1934-10-11 , 47  yrs. old GENDER: Male ENDOSCOPIST: Arta Silence, MD REFERRED BY:  [facility] PROCEDURE DATE:  04/27/2013 PROCEDURE:  EGD, diagnostic ASA CLASS:     Class III INDICATIONS:  blood in stool. MEDICATIONS: Fentanyl 50 mcg IV and Versed 4 mg IV TOPICAL ANESTHETIC: Cetacaine Spray  DESCRIPTION OF PROCEDURE: After the risks benefits and alternatives of the procedure were thoroughly explained, informed consent was obtained.  The Pentax Gastroscope [id number] endoscope was introduced through the mouth and advanced to the second portion of the duodenum. Without limitations.  The instrument was slowly withdrawn as the mucosa was fully examined.     Findings:  Moderate-to-severe distal erosive esophagitis.  Patchy candida esophagitis of proximal-to-mid esophagus.  Couple areas of proximal gastric erythema of doubtful clinical significance. Few punctate moderately deep ulcers seen in duodenal bulb; no active bleeding, visible vessel, or adherent clot was seen.  There was some mild post-bulbar stenosis, likely reparative changes from prior ulcer disease.  Otherwise normal endoscopy to the second portion of the duodenum.  No old or fresh blood was seen to the extent of our examination.            The scope was then withdrawn from the patient and the procedure completed.  ENDOSCOPIC IMPRESSION:     As above.  Bleeding highly likely from duodenal ulcers; etiology unclear but ischemic ulcers (given his significant peripheral vascular disease) is a consideration.   RECOMMENDATIONS:     1.  Watch for potential complications of procedure. 2.  PPI. 3.  Clear liquids for now. 4.  Minimize NSAIDs/antiplatelet agents for now. 5.  Patient is at low risk for rebleeding from this ulcers (which are clean-based) while he  is off antiplatelet therapy, but is at a not insignificant risk for rebleeding if placed on antiplatelet or anticoagulant therapy. 6.  [facility] will follow.  eSigned:  Arta Silence, MD 04/27/2013 4:32 PM   CC:

## 2013-04-27 NOTE — Consult Note (Signed)
[facility] Gastroenterology Consultation Note  Referring Provider: Dr. Doree Barthel [facility]) Primary Care Physician:  Simona Huh, MD Primary Gastroenterologist:  Dr. Arta Silence  Reason for Consultation:  Blood in stool, anemia  HPI: [name] is a 79 y.o. male admitted one week ago for right lower extremity cellulitis in setting of peripheral vascular disease.  Patient developed some dark stools a couple days ago, and has had drop in hemoglobin.  He has ongoing intermittent periumbilical abdominal pain.  No frank hematemesis or hematochezia.  Endoscopy and endoscopic ultrasound about one year ago was unrevealing except for some subtle periampullary soft tissue, and subsequent work up for this (and ongoing weight loss at the time) was unrevealing.  Colonoscopy by Dr. Oletta Lamas in June 2012 showed poor prep, few small polyps, no mentioned diverticulosis.  No blood thinners.  No regular NSAIDs.   Past Medical History  Diagnosis Date  . Hyperlipidemia   . History of colon polyps   . Spinal stenosis   . Anxiety   . BPH (benign prostatic hypertrophy)   . ED (erectile dysfunction)   . Osteoporosis   . Hypertension   . Chest pain     "I've had it" (01/31/2012)   . Peripheral vascular disease     "right leg is 100% blocked; left leg has stent, still ~ 75% blocked" (01/31/2012)  . Peripheral neuropathy     "bad" (01/31/2012)  . COPD (chronic obstructive pulmonary disease)   . Emphysema   . BRBPR (bright red blood per rectum)     "don't know what it's from; had some this week" (01/31/2012)  . Chronic lower back pain   . Pernicious anemia   . GERD (gastroesophageal reflux disease)   . Polyneuropathy in other diseases classified elsewhere 11/06/2012  . Prostate cancer 2004    "had 40 treatments of radiation" (01/31/2012)  . Breast cancer in male     "left" (01/31/2012)  . Basal cell carcinoma of nasal tip   . Pneumonia 1960's    "double"   . Cervical spondylosis   . Rheumatoid  arthritis(714.0)   . Arthritis     "all over my body" (04/25/2013)  . DDD (degenerative disc disease), lumbosacral     "S1; L5" (01/31/2012)  . Lumbosacral spondylosis without myelopathy 11/06/2012  . Cellulitis of right leg   . Depression     "won't take RX though" (04/25/2013)  . Falls frequently     Past Surgical History  Procedure Laterality Date  . Basal cell carcinoma excision      Nose x 3  . Cholecystectomy  ?1990's  . Wrist fusion Right     "3 OR's; it's fused" (01/31/2012)  . Excisional hemorrhoidectomy  1960's  . Knee surgery Right      6 surgeries; went in for simple cartilage OR; ended up w/fused knee" (01/31/2012)  . Iliac artery stent  12-07-09    Stent done by Dr. Gwenlyn Found  . Total hip arthroplasty  08/16/2011    Procedure: TOTAL HIP ARTHROPLASTY ANTERIOR APPROACH;  Surgeon: Mcarthur Rossetti, MD;  Location: [facility];  Service: Orthopedics;  Laterality: Right;  Right total hip replacement  . Cervical disc surgery      "7 total; ended w/a fusion" (01/31/2012)  . Esophagogastroduodenoscopy  02/22/2012    Procedure: ESOPHAGOGASTRODUODENOSCOPY (EGD);  Surgeon: Arta Silence, MD;  Location: [facility];  Service: Endoscopy;  Laterality: N/A;  . Eus  02/22/2012    Procedure: UPPER ENDOSCOPIC ULTRASOUND (EUS) RADIAL;  Surgeon: Arta Silence, MD;  Location: [facility];  Service: Endoscopy;  Laterality: N/A;  . Hernia repair    . Transurethral resection of prostate    . Aortogram  04/25/2013    Prior to Admission medications   Medication Sig Start Date End Date Taking? Authorizing Provider  albuterol (PROVENTIL) (2.5 MG/3ML) 0.083% nebulizer solution Take 2.5 mg by nebulization every 4 (four) hours as needed for wheezing or shortness of breath.   Yes Historical Provider, MD  ALPRAZolam Duanne Moron) 1 MG tablet Take 1 mg by mouth at bedtime.    Yes Historical Provider, MD  diazepam (VALIUM) 5 MG tablet One tablet twice during the day and two tablets at night 01/29/13  Yes Kathrynn Ducking, MD  Fluticasone-Salmeterol (ADVAIR) 250-50 MCG/DOSE AEPB Inhale 1 puff into the lungs every 12 (twelve) hours. 02/22/13  Yes Tanda Rockers, MD  hydrOXYzine (ATARAX/VISTARIL) 25 MG tablet Take 25 mg by mouth 2 (two) times daily as needed for anxiety.   Yes Historical Provider, MD  oxycodone (ROXICODONE) 30 MG immediate release tablet Take 1 tablet (30 mg total) by mouth every 3 (three) hours as needed. MUST LAST 28 DAYS 04/15/13  Yes Kathrynn Ducking, MD  prednisoLONE 5 MG TABS tablet Take 5 mg by mouth daily.   Yes Historical Provider, MD  rosuvastatin (CRESTOR) 20 MG tablet Take 20 mg by mouth at bedtime.    Yes Historical Provider, MD  silver sulfADIAZINE (SILVADENE) 1 % cream Apply topically daily. 11/15/12  Yes Malvin Johns, MD  tiotropium (SPIRIVA) 18 MCG inhalation capsule Place 1 capsule (18 mcg total) into inhaler and inhale daily. 02/22/13  Yes Tanda Rockers, MD  traZODone (DESYREL) 50 MG tablet Take 50 mg by mouth at bedtime.   Yes Historical Provider, MD  sertraline (ZOLOFT) 50 MG tablet Take 1 tablet (50 mg total) by mouth daily. 06/25/12   Brand Males, MD    Current Facility-Administered Medications  Medication Dose Route Frequency Provider Last Rate Last Dose  . 0.9 % NaCl with KCl 40 mEq / L  infusion   Intravenous Continuous Delfina Redwood, MD 75 mL/hr at 04/27/13 0744 75 mL/hr at 04/27/13 0744  . acetaminophen (TYLENOL) suppository 650 mg  650 mg Rectal Q6H PRN Dianne Dun, NP   650 mg at 04/20/13 2302  . albuterol (PROVENTIL) (2.5 MG/3ML) 0.083% nebulizer solution 2.5 mg  2.5 mg Nebulization Q4H PRN Etta Quill, DO   2.5 mg at 04/25/13 2020  . ciprofloxacin (CIPRO) IVPB 400 mg  400 mg Intravenous Q12H Delfina Redwood, MD   400 mg at 04/27/13 0217  . [START ON 04/28/2013] diazepam (VALIUM) tablet 2 mg  2 mg Oral QHS Delfina Redwood, MD      . HYDROmorphone (DILAUDID) injection 1-2 mg  1-2 mg Intravenous Q4H PRN Delfina Redwood, MD   1 mg at  04/27/13 1041  . hydrOXYzine (ATARAX/VISTARIL) tablet 25 mg  25 mg Oral BID PRN Etta Quill, DO   25 mg at 04/26/13 0950  . mometasone-formoterol (DULERA) 100-5 MCG/ACT inhaler 2 puff  2 puff Inhalation BID Etta Quill, DO   2 puff at 04/27/13 0813  . pantoprazole (PROTONIX) injection 40 mg  40 mg Intravenous Q12H Delfina Redwood, MD   40 mg at 04/27/13 1006  . potassium chloride SA (K-DUR,KLOR-CON) CR tablet 40 mEq  40 mEq Oral Once Adeline C Viyuoh, MD      . prednisoLONE tablet 5 mg  5 mg Oral Q breakfast Kevan Ny  Nestor Lewandowsky, DO   5 mg at 04/27/13 0813  . [place]   Oral PRN Delfina Redwood, MD      . sertraline (ZOLOFT) tablet 50 mg  50 mg Oral Daily Etta Quill, DO   50 mg at 04/27/13 1006  . tiotropium (SPIRIVA) inhalation capsule 18 mcg  18 mcg Inhalation Daily Etta Quill, DO   18 mcg at 04/27/13 0813  . traZODone (DESYREL) tablet 50 mg  50 mg Oral QHS Etta Quill, DO   50 mg at 04/26/13 2214  . vancomycin (VANCOCIN) 1,250 mg in sodium chloride 0.9 % 250 mL IVPB  1,250 mg Intravenous Q24H Delfina Redwood, MD   1,250 mg at 04/27/13 0559    Allergies as of 04/20/2013 - Review Complete 04/20/2013  Allergen Reaction Noted  . Lipitor [atorvastatin calcium] Other (See Comments) 05/09/2011  . Penicillins Other (See Comments) 10/24/2012    Family History  Problem Relation Age of Onset  . Hypertension Mother   . Cancer Mother     colon cancer  . Stroke Mother   . Aneurysm Father     abdominal aortic  . Heart disease Brother     History   Social History  . Marital Status: Widowed    Spouse Name: N/A    Number of Children: 6  . Years of Education: HS   Occupational History  . Retired    Social History Main Topics  . Smoking status: Current Every Day Smoker -- 2.00 packs/day for 63 years    Types: Cigarettes    Last Attempt to Quit: 01/31/2012  . Smokeless tobacco: Never Used  . Alcohol Use: Yes     Comment: 04/25/2013 "used to be a  heavy drinker; stopped drinking in 1975"  . Drug Use: No  . Sexual Activity: No   Other Topics Concern  . Not on file   Social History Narrative  . No narrative on file    Review of Systems: As per HPI, all others negative  Physical Exam: Vital signs in last 24 hours: Temp:  [98.1 F (36.7 C)-99 F (37.2 C)] 99 F (37.2 C) (03/14 0948) Pulse Rate:  [89-102] 93 (03/14 0948) Resp:  [18-20] 18 (03/14 0948) BP: (104-155)/(43-78) 104/43 mmHg (03/14 0948) SpO2:  [90 %-95 %] 91 % (03/14 0948) Weight:  [58.9 kg (129 lb 13.6 oz)] 58.9 kg (129 lb 13.6 oz) (03/13 2050) Last BM Date: 04/26/13 General:   Alert,  Chronically frail- and cachectic-appearing, but is in no acute distress Head:  Normocephalic and atraumatic. Eyes:  Sclera clear, no icterus.   Conjunctiva pale Ears:  Normal auditory acuity. Nose:  No deformity, discharge,  or lesions. Mouth:  No deformity or lesions.  Oropharynx pink but dry Neck:  Supple; no masses or thyromegaly. Lungs:  Clear throughout to auscultation.   No wheezes, crackles, or rhonchi. No acute distress. Heart:  Regular rate and rhythm; no murmurs, clicks, rubs,  or gallops. Abdomen:  Soft, non-distended, mild periumbilical tenderness without peritonitis. No masses, hepatosplenomegaly or hernias noted. Normal bowel sounds, without guarding, and without rebound.     Msk:  Diffuse muscular atrophy, otherwise symmetrical without gross deformities. Normal posture. Pulses:  Normal pulses noted. Extremities:  Without clubbing or edema. Neurologic:  Alert and  oriented x4;  Diffusely weak, otherwise grossly normal neurologically. Skin:  RIght lower extremity cellulitis (dressing in place); Scattered ecchymoses Psych:  Alert and cooperative. Depressed mood and flat affect   Lab Results:  Recent Labs  04/26/13 0603  04/26/13 1922 04/27/13 0140 04/27/13 0905  WBC 9.9  --   --   --   --   HGB 11.9*  < > 11.7* 9.7* 8.8*  HCT 32.9*  < > 32.3* 26.8* 24.4*   PLT 196  --   --   --   --   < > = values in this interval not displayed. BMET  Recent Labs  04/26/13 0603 04/27/13 0140  NA 141 137  K 2.7* 3.4*  CL 105 103  CO2 25 24  GLUCOSE 152* 127*  BUN 25* 26*  CREATININE 0.70 0.83  CALCIUM 8.4 7.9*   LFT No results found for this basename: PROT, ALBUMIN, AST, ALT, ALKPHOS, BILITOT, BILIDIR, IBILI,  in the last 72 hours PT/INR No results found for this basename: LABPROT, INR,  in the last 72 hours  Studies/Results: No results found.  Impression:  1.  Dark stools. 2.  Abdominal pain. 3.  Acute blood loss anemia. 4.  Peripheral vascular disease.  Plan:  1.  NPO. 2.  PPI. 3.  Serial CBCs and volume repletion. 4.  EGD today.  If EGD is unrevealing, will likely need colonoscopy this admission as next step in management.   LOS: 7 days   Cavan Bearden M  04/27/2013, 12:25 PM

## 2013-04-27 NOTE — Progress Notes (Signed)
Clinical Social Work Department CLINICAL SOCIAL WORK PLACEMENT NOTE 04/27/2013  Patient:  [name], [name]  Account Number:  [id number] [place] date:  04/20/2013  Clinical Social Worker:  Berton Mount, Latanya Presser  Date/time:  04/27/2013 10:30 AM  Clinical Social Work is seeking post-discharge placement for this patient at the following level of care:   [place]   (*CSW will update this form in Epic as items are completed)   04/27/2013  Patient/family provided with [place] Department of Clinical Social Work's list of facilities offering this level of care within the geographic area requested by the patient (or if unable, by the patient's family).  04/27/2013  Patient/family informed of their freedom to choose among providers that offer the needed level of care, that participate in Medicare, Medicaid or managed care program needed by the patient, have an available bed and are willing to accept the patient.  04/27/2013  Patient/family informed of [facility]' ownership interest in [facility], as well as of the fact that they are under no obligation to receive care at this facility.  PASARR submitted to EDS on Existing PASARR number received from EDS on   FL2 transmitted to all facilities in geographic area requested by pt/family on  04/27/2013 FL2 transmitted to all facilities within larger geographic area on   Patient informed that his/her managed care company has contracts with or will negotiate with  certain facilities, including the following:     Patient/family informed of bed offers received:   Patient chooses bed at  Physician recommends and patient chooses bed at    Patient to be transferred to  on   Patient to be transferred to facility by   The following physician request were entered in Epic:   Additional CommentsBerton Mount, [place]

## 2013-04-27 NOTE — H&P (View-Only) (Signed)
[facility] Consultation Note  Referring Provider: Dr. Doree Barthel [facility]) Primary Care Physician:  Simona Huh, MD Primary Gastroenterologist:  Dr. Arta Silence  Reason for Consultation:  Blood in stool, anemia  HPI: [name] is a 79 y.o. male admitted one week ago for right lower extremity cellulitis in setting of peripheral vascular disease.  Patient developed some dark stools a couple days ago, and has had drop in hemoglobin.  He has ongoing intermittent periumbilical abdominal pain.  No frank hematemesis or hematochezia.  Endoscopy and endoscopic ultrasound about one year ago was unrevealing except for some subtle periampullary soft tissue, and subsequent work up for this (and ongoing weight loss at the time) was unrevealing.  Colonoscopy by Dr. Oletta Lamas in June 2012 showed poor prep, few small polyps, no mentioned diverticulosis.  No blood thinners.  No regular NSAIDs.   Past Medical History  Diagnosis Date  . Hyperlipidemia   . History of colon polyps   . Spinal stenosis   . Anxiety   . BPH (benign prostatic hypertrophy)   . ED (erectile dysfunction)   . Osteoporosis   . Hypertension   . Chest pain     "I've had it" (01/31/2012)   . Peripheral vascular disease     "right leg is 100% blocked; left leg has stent, still ~ 75% blocked" (01/31/2012)  . Peripheral neuropathy     "bad" (01/31/2012)  . COPD (chronic obstructive pulmonary disease)   . Emphysema   . BRBPR (bright red blood per rectum)     "don't know what it's from; had some this week" (01/31/2012)  . Chronic lower back pain   . Pernicious anemia   . GERD (gastroesophageal reflux disease)   . Polyneuropathy in other diseases classified elsewhere 11/06/2012  . Prostate cancer 2004    "had 40 treatments of radiation" (01/31/2012)  . Breast cancer in male     "left" (01/31/2012)  . Basal cell carcinoma of nasal tip   . Pneumonia 1960's    "double"   . Cervical spondylosis   . Rheumatoid  arthritis(714.0)   . Arthritis     "all over my body" (04/25/2013)  . DDD (degenerative disc disease), lumbosacral     "S1; L5" (01/31/2012)  . Lumbosacral spondylosis without myelopathy 11/06/2012  . Cellulitis of right leg   . Depression     "won't take RX though" (04/25/2013)  . Falls frequently     Past Surgical History  Procedure Laterality Date  . Basal cell carcinoma excision      Nose x 3  . Cholecystectomy  ?1990's  . Wrist fusion Right     "3 OR's; it's fused" (01/31/2012)  . Excisional hemorrhoidectomy  1960's  . Knee surgery Right      6 surgeries; went in for simple cartilage OR; ended up w/fused knee" (01/31/2012)  . Iliac artery stent  12-07-09    Stent done by Dr. Gwenlyn Found  . Total hip arthroplasty  08/16/2011    Procedure: TOTAL HIP ARTHROPLASTY ANTERIOR APPROACH;  Surgeon: Mcarthur Rossetti, MD;  Location: [place];  Service: Orthopedics;  Laterality: Right;  Right total hip replacement  . Cervical disc surgery      "7 total; ended w/a fusion" (01/31/2012)  . Esophagogastroduodenoscopy  02/22/2012    Procedure: ESOPHAGOGASTRODUODENOSCOPY (EGD);  Surgeon: Arta Silence, MD;  Location: [facility];  Service: Endoscopy;  Laterality: N/A;  . Eus  02/22/2012    Procedure: UPPER ENDOSCOPIC ULTRASOUND (EUS) RADIAL;  Surgeon: Arta Silence, MD;  Location: [facility];  Service: Endoscopy;  Laterality: N/A;  . Hernia repair    . Transurethral resection of prostate    . Aortogram  04/25/2013    Prior to Admission medications   Medication Sig Start Date End Date Taking? Authorizing Provider  albuterol (PROVENTIL) (2.5 MG/3ML) 0.083% nebulizer solution Take 2.5 mg by nebulization every 4 (four) hours as needed for wheezing or shortness of breath.   Yes Historical Provider, MD  ALPRAZolam Duanne Moron) 1 MG tablet Take 1 mg by mouth at bedtime.    Yes Historical Provider, MD  diazepam (VALIUM) 5 MG tablet One tablet twice during the day and two tablets at night 01/29/13  Yes Kathrynn Ducking, MD  Fluticasone-Salmeterol (ADVAIR) 250-50 MCG/DOSE AEPB Inhale 1 puff into the lungs every 12 (twelve) hours. 02/22/13  Yes Tanda Rockers, MD  hydrOXYzine (ATARAX/VISTARIL) 25 MG tablet Take 25 mg by mouth 2 (two) times daily as needed for anxiety.   Yes Historical Provider, MD  oxycodone (ROXICODONE) 30 MG immediate release tablet Take 1 tablet (30 mg total) by mouth every 3 (three) hours as needed. MUST LAST 28 DAYS 04/15/13  Yes Kathrynn Ducking, MD  prednisoLONE 5 MG TABS tablet Take 5 mg by mouth daily.   Yes Historical Provider, MD  rosuvastatin (CRESTOR) 20 MG tablet Take 20 mg by mouth at bedtime.    Yes Historical Provider, MD  silver sulfADIAZINE (SILVADENE) 1 % cream Apply topically daily. 11/15/12  Yes Malvin Johns, MD  tiotropium (SPIRIVA) 18 MCG inhalation capsule Place 1 capsule (18 mcg total) into inhaler and inhale daily. 02/22/13  Yes Tanda Rockers, MD  traZODone (DESYREL) 50 MG tablet Take 50 mg by mouth at bedtime.   Yes Historical Provider, MD  sertraline (ZOLOFT) 50 MG tablet Take 1 tablet (50 mg total) by mouth daily. 06/25/12   Brand Males, MD    Current Facility-Administered Medications  Medication Dose Route Frequency Provider Last Rate Last Dose  . 0.9 % NaCl with KCl 40 mEq / L  infusion   Intravenous Continuous Delfina Redwood, MD 75 mL/hr at 04/27/13 0744 75 mL/hr at 04/27/13 0744  . acetaminophen (TYLENOL) suppository 650 mg  650 mg Rectal Q6H PRN Dianne Dun, NP   650 mg at 04/20/13 2302  . albuterol (PROVENTIL) (2.5 MG/3ML) 0.083% nebulizer solution 2.5 mg  2.5 mg Nebulization Q4H PRN Etta Quill, DO   2.5 mg at 04/25/13 2020  . ciprofloxacin (CIPRO) IVPB 400 mg  400 mg Intravenous Q12H Delfina Redwood, MD   400 mg at 04/27/13 0217  . [START ON 04/28/2013] diazepam (VALIUM) tablet 2 mg  2 mg Oral QHS Delfina Redwood, MD      . HYDROmorphone (DILAUDID) injection 1-2 mg  1-2 mg Intravenous Q4H PRN Delfina Redwood, MD   1 mg at  04/27/13 1041  . hydrOXYzine (ATARAX/VISTARIL) tablet 25 mg  25 mg Oral BID PRN Etta Quill, DO   25 mg at 04/26/13 0950  . mometasone-formoterol (DULERA) 100-5 MCG/ACT inhaler 2 puff  2 puff Inhalation BID Etta Quill, DO   2 puff at 04/27/13 0813  . pantoprazole (PROTONIX) injection 40 mg  40 mg Intravenous Q12H Delfina Redwood, MD   40 mg at 04/27/13 1006  . potassium chloride SA (K-DUR,KLOR-CON) CR tablet 40 mEq  40 mEq Oral Once Adeline C Viyuoh, MD      . prednisoLONE tablet 5 mg  5 mg Oral Q breakfast Kevan Ny  Nestor Lewandowsky, DO   5 mg at 04/27/13 0813  . [place]   Oral PRN Delfina Redwood, MD      . sertraline (ZOLOFT) tablet 50 mg  50 mg Oral Daily Etta Quill, DO   50 mg at 04/27/13 1006  . tiotropium (SPIRIVA) inhalation capsule 18 mcg  18 mcg Inhalation Daily Etta Quill, DO   18 mcg at 04/27/13 0813  . traZODone (DESYREL) tablet 50 mg  50 mg Oral QHS Etta Quill, DO   50 mg at 04/26/13 2214  . vancomycin (VANCOCIN) 1,250 mg in sodium chloride 0.9 % 250 mL IVPB  1,250 mg Intravenous Q24H Delfina Redwood, MD   1,250 mg at 04/27/13 0559    Allergies as of 04/20/2013 - Review Complete 04/20/2013  Allergen Reaction Noted  . Lipitor [atorvastatin calcium] Other (See Comments) 05/09/2011  . Penicillins Other (See Comments) 10/24/2012    Family History  Problem Relation Age of Onset  . Hypertension Mother   . Cancer Mother     colon cancer  . Stroke Mother   . Aneurysm Father     abdominal aortic  . Heart disease Brother     History   Social History  . Marital Status: Widowed    Spouse Name: N/A    Number of Children: 27  . Years of Education: HS   Occupational History  . Retired    Social History Main Topics  . Smoking status: Current Every Day Smoker -- 2.00 packs/day for 63 years    Types: Cigarettes    Last Attempt to Quit: 01/31/2012  . Smokeless tobacco: Never Used  . Alcohol Use: Yes     Comment: 04/25/2013 "used to be a  heavy drinker; stopped drinking in 1975"  . Drug Use: No  . Sexual Activity: No   Other Topics Concern  . Not on file   Social History Narrative  . No narrative on file    Review of Systems: As per HPI, all others negative  Physical Exam: Vital signs in last 24 hours: Temp:  [98.1 F (36.7 C)-99 F (37.2 C)] 99 F (37.2 C) (03/14 0948) Pulse Rate:  [89-102] 93 (03/14 0948) Resp:  [18-20] 18 (03/14 0948) BP: (104-155)/(43-78) 104/43 mmHg (03/14 0948) SpO2:  [90 %-95 %] 91 % (03/14 0948) Weight:  [58.9 kg (129 lb 13.6 oz)] 58.9 kg (129 lb 13.6 oz) (03/13 2050) Last BM Date: 04/26/13 General:   Alert,  Chronically frail- and cachectic-appearing, but is in no acute distress Head:  Normocephalic and atraumatic. Eyes:  Sclera clear, no icterus.   Conjunctiva pale Ears:  Normal auditory acuity. Nose:  No deformity, discharge,  or lesions. Mouth:  No deformity or lesions.  Oropharynx pink but dry Neck:  Supple; no masses or thyromegaly. Lungs:  Clear throughout to auscultation.   No wheezes, crackles, or rhonchi. No acute distress. Heart:  Regular rate and rhythm; no murmurs, clicks, rubs,  or gallops. Abdomen:  Soft, non-distended, mild periumbilical tenderness without peritonitis. No masses, hepatosplenomegaly or hernias noted. Normal bowel sounds, without guarding, and without rebound.     Msk:  Diffuse muscular atrophy, otherwise symmetrical without gross deformities. Normal posture. Pulses:  Normal pulses noted. Extremities:  Without clubbing or edema. Neurologic:  Alert and  oriented x4;  Diffusely weak, otherwise grossly normal neurologically. Skin:  RIght lower extremity cellulitis (dressing in place); Scattered ecchymoses Psych:  Alert and cooperative. Depressed mood and flat affect   Lab Results:  Recent Labs  04/26/13 0603  04/26/13 1922 04/27/13 0140 04/27/13 0905  WBC 9.9  --   --   --   --   HGB 11.9*  < > 11.7* 9.7* 8.8*  HCT 32.9*  < > 32.3* 26.8* 24.4*   PLT 196  --   --   --   --   < > = values in this interval not displayed. BMET  Recent Labs  04/26/13 0603 04/27/13 0140  NA 141 137  K 2.7* 3.4*  CL 105 103  CO2 25 24  GLUCOSE 152* 127*  BUN 25* 26*  CREATININE 0.70 0.83  CALCIUM 8.4 7.9*   LFT No results found for this basename: PROT, ALBUMIN, AST, ALT, ALKPHOS, BILITOT, BILIDIR, IBILI,  in the last 72 hours PT/INR No results found for this basename: LABPROT, INR,  in the last 72 hours  Studies/Results: No results found.  Impression:  1.  Dark stools. 2.  Abdominal pain. 3.  Acute blood loss anemia. 4.  Peripheral vascular disease.  Plan:  1.  NPO. 2.  PPI. 3.  Serial CBCs and volume repletion. 4.  EGD today.  If EGD is unrevealing, will likely need colonoscopy this admission as next step in management.   LOS: 7 days   Kaelah Hayashi M  04/27/2013, 12:25 PM

## 2013-04-27 NOTE — Progress Notes (Addendum)
[facility] PROGRESS NOTE  [name] YQM:[id number] DOB: 1934/12/02 DOA: 04/20/2013 PCP: Simona Huh, MD  Assessment/Plan: GI bleed.  Differing reports about color of stool.  Patient and RN report bloody. Rectal exam by me yesterday: dark brown, not tarry, heme positive. RN today reports continued bloody loose stool.  hgb dropped and 3.5 g overnight. Unclear whether upper or lower source. Have discussed with Dr. Paulita Fujita. Likely EGD today. Remains n.p.o. Continue empiric protonix.  Continue serial H&H. Transfuse when necessary. Blood pressure remain stable.  1.Cellulitis with draining L. Leg wound with calf ulceration and sepsis syndrome Slowly improving on vancomycin and Cipro. Await plans from Dr. Donnetta Hutching regarding need for revascularization.  PAD: per vascular  Hypokalemia: Improving but still low. Continue repletion IV.   Acute blood loss anemia  Diarrhea: c diff negative.   Cough: CXR shows nonspecific findings  Chronic dysphagia: modified diet per speech  COPD (severe, per patient): no wheeze  Debility: SNF once medically stable  Protein calorie malnutrition  Rheumatoid arthritis on chronic prednisone  Code Status: full Family Communication: son 3/12 Disposition Plan: SNF   Consultants:  Vascular surgery- Dr Donnetta Hutching  Procedures:  arteriogram  Antibiotics:  vanc started on 3/7   cipro 3/11  HPI/Subjective: Leg pain continues. He reports stool color "becoming more normal".  No vomiting. Some "lower" abdominal pain.  Objective: Filed Vitals:   04/27/13 0948  BP: 104/43  Pulse: 93  Temp: 99 F (37.2 C)  Resp: 18    Intake/Output Summary (Last 24 hours) at 04/27/13 1210 Last data filed at 04/27/13 0949  Gross per 24 hour  Intake   3235 ml  Output   1100 ml  Net   2135 ml   Filed Weights   04/20/13 2012 04/26/13 0434 04/26/13 2050  Weight: 56.5 kg (124 lb 9 oz) 60.4 kg (133 lb 2.5 oz) 58.9 kg (129 lb 13.6 oz)    Exam:  General: awake.  Weak appearing.  Cardiovascular: RRR, nl S1 s2 Respiratory: CTA without WRR, wet cough occasionally Abdomen: S, NT, ND Extremities: erythema less intense, less indurated. Tender.  Data Reviewed: Basic Metabolic Panel:  Recent Labs Lab 04/21/13 0615 04/23/13 0730 04/24/13 0600 04/26/13 0603 04/27/13 0140  NA 137 140 138 141 137  K 4.0 3.4* 3.4* 2.7* 3.4*  CL 95* 100 102 105 103  CO2 25 22 16* 25 24  GLUCOSE 94 88 90 152* 127*  BUN 27* 24* 24* 25* 26*  CREATININE 1.26 0.81 0.71 0.70 0.83  CALCIUM 9.0 9.0 8.8 8.4 7.9*  MG  --   --   --  1.8  --    Liver Function Tests:  Recent Labs Lab 04/21/13 0615  AST 44*  ALT 10  ALKPHOS 79  BILITOT 1.2  PROT 5.9*  ALBUMIN 2.5*   No results found for this basename: LIPASE, AMYLASE,  in the last 168 hours No results found for this basename: AMMONIA,  in the last 168 hours CBC:  Recent Labs Lab 04/20/13 1750 04/21/13 0615 04/23/13 0730 04/24/13 0600 04/26/13 0603 04/26/13 1400 04/26/13 1922 04/27/13 0140 04/27/13 0905  WBC 14.6* 12.2* 14.2* 12.2* 9.9  --   --   --   --   HGB 15.6 13.5 13.3 13.8 11.9* 12.2* 11.7* 9.7* 8.8*  HCT 42.9 38.4* 37.4* 39.3 32.9* 34.4* 32.3* 26.8* 24.4*  MCV 94.7 94.8 93.5 93.1 89.9  --   --   --   --   PLT 127* 98* 129* 151 196  --   --   --   --  Cardiac Enzymes: No results found for this basename: CKTOTAL, CKMB, CKMBINDEX, TROPONINI,  in the last 168 hours BNP (last 3 results) No results found for this basename: PROBNP,  in the last 8760 hours CBG: No results found for this basename: GLUCAP,  in the last 168 hours  Recent Results (from the past 240 hour(s))  CULTURE, BLOOD (ROUTINE X 2)     Status: None   Collection Time    04/20/13  9:15 PM      Result Value Ref Range Status   Specimen Description BLOOD RIGHT HAND   Final   Special Requests BOTTLES DRAWN AEROBIC ONLY 4CC   Final   Culture  Setup Time     Final   Value: 04/21/2013 13:08     Performed at [facility]    Culture     Final   Value:        BLOOD CULTURE RECEIVED NO GROWTH TO DATE CULTURE WILL BE HELD FOR 5 DAYS BEFORE ISSUING A FINAL NEGATIVE REPORT     Performed at [facility]   Report Status PENDING   Incomplete  CULTURE, BLOOD (ROUTINE X 2)     Status: None   Collection Time    04/20/13  9:26 PM      Result Value Ref Range Status   Specimen Description BLOOD LEFT HAND   Final   Special Requests BOTTLES DRAWN AEROBIC ONLY 3CC   Final   Culture  Setup Time     Final   Value: 04/21/2013 13:08     Performed at [facility]   Culture     Final   Value:        BLOOD CULTURE RECEIVED NO GROWTH TO DATE CULTURE WILL BE HELD FOR 5 DAYS BEFORE ISSUING A FINAL NEGATIVE REPORT     Performed at [facility]   Report Status PENDING   Incomplete  CLOSTRIDIUM DIFFICILE BY PCR     Status: None   Collection Time    04/25/13  1:59 PM      Result Value Ref Range Status   C difficile by pcr NEGATIVE  NEGATIVE Final     Studies: No results found.  Scheduled Meds: . ciprofloxacin  400 mg Intravenous Q12H  . [START ON 04/28/2013] diazepam  2 mg Oral QHS  . mometasone-formoterol  2 puff Inhalation BID  . pantoprazole (PROTONIX) IV  40 mg Intravenous Q12H  . potassium chloride  40 mEq Oral Once  . prednisoLONE  5 mg Oral Q breakfast  . sertraline  50 mg Oral Daily  . tiotropium  18 mcg Inhalation Daily  . traZODone  50 mg Oral QHS  . vancomycin  1,250 mg Intravenous Q24H   Continuous Infusions: . 0.9 % NaCl with KCl 40 mEq / L 75 mL/hr (04/27/13 0744)   Time spent: 35 min  [facility] [facility] [facility] Pager [phone]. If 7PM-7AM, please contact night-coverage at [email or web address], password [email or web address] 04/27/2013, 12:10 PM  LOS: 7 days

## 2013-04-27 NOTE — Progress Notes (Signed)
Clinical Social Work Department BRIEF PSYCHOSOCIAL ASSESSMENT 04/27/2013  Patient:  [name], [name]     Account Number:  [id number]     [place] date:  04/20/2013  Clinical Social Worker:  Adair Laundry  Date/Time:  04/27/2013 10:30 AM  Referred by:  Physician  Date Referred:  04/27/2013 Referred for  SNF Placement   Other Referral:   Interview type:  Patient Other interview type:    PSYCHOSOCIAL DATA Living Status:  ALONE Admitted from facility:   Level of care:   Primary support name:  Burton Apley [phone] Primary support relationship to patient:  CHILD, ADULT Degree of support available:   Pt has adequate support    CURRENT CONCERNS Current Concerns  Post-Acute Placement   Other Concerns:    SOCIAL WORK ASSESSMENT / PLAN CSW visited pt room and spoke with pt about SNF. Pt was in pain during assessment and explained to CSW that he is having "foot pain". CSW encouraged pt to speak with MD about this and pt agreed that would be a good decision.    Pt was already aware of recommendation for rehab and informed CSWA he has been to [facility] twice before and would not mind returning. CSW explained referal process and at this time pt is requesting to only be refered out to [facility]. Pt stated the staff at the facility "are very nice and I don't mind it there". CSW explained that if there were any issues with pt returning to this facility that weekday CSW would follow up with pt.   Assessment/plan status:  Psychosocial Support/Ongoing Assessment of Needs Other assessment/ plan:   Information/referral to community resources:   SNF list denied    PATIENT'S/FAMILY'S RESPONSE TO PLAN OF CARE: Pt is agreeable to dc to SNF       [place], [place]

## 2013-04-28 LAB — BASIC METABOLIC PANEL
BUN: 21 mg/dL (ref 6–23)
CALCIUM: 7.7 mg/dL — AB (ref 8.4–10.5)
CO2: 22 mEq/L (ref 19–32)
Chloride: 104 mEq/L (ref 96–112)
Creatinine, Ser: 0.75 mg/dL (ref 0.50–1.35)
GFR calc Af Amer: >90 mL/min (ref >90)
GFR, EST NON AFRICAN AMERICAN: 87 mL/min — AB (ref >90)
GLUCOSE: 87 mg/dL (ref 70–99)
Potassium: 3.8 mEq/L (ref 3.7–5.3)
Sodium: 138 mEq/L (ref 137–147)

## 2013-04-28 LAB — HEMOGLOBIN AND HEMATOCRIT, BLOOD
HCT: 21.1 % — ABNORMAL LOW (ref 39.0–52.0)
HCT: 24.5 % — ABNORMAL LOW (ref 39.0–52.0)
Hemoglobin: 7.4 g/dL — ABNORMAL LOW (ref 13.0–17.0)
Hemoglobin: 8.5 g/dL — ABNORMAL LOW (ref 13.0–17.0)

## 2013-04-28 MED ORDER — PANTOPRAZOLE SODIUM 40 MG PO TBEC
40.0000 mg | DELAYED_RELEASE_TABLET | Freq: Two times a day (BID) | ORAL | Status: DC
  Administered 2013-04-28 – 2013-05-07 (×18): 40 mg via ORAL
  Filled 2013-04-28 (×14): qty 1

## 2013-04-28 NOTE — Progress Notes (Signed)
Patient has not had bowel movement this shift for sample.

## 2013-04-28 NOTE — Progress Notes (Signed)
[facility] PROGRESS NOTE  [name] [id number]:[id number] DOB: 03/04/1934 DOA: 04/20/2013 PCP: Simona Huh, MD  Assessment/Plan: GI bleed.  -Dr. Paulita Fujita: EGD 3/14 : Bleeding highly likely from duodenal ulcers; etiology unclear but ischemic ulcers (given his significant peripheral vascular disease) is a consideration -Continue empiric protonix.   Continue serial H&H. Transfuse when necessary. Blood pressure remain stable.  Cellulitis with draining L. Leg wound with calf ulceration and sepsis syndrome Slowly improving on vancomycin and Cipro. Await plans from Dr. Donnetta Hutching regarding need for revascularization.  PAD: per vascular  Hypokalemia: Improving but still low. Continue repletion IV.   Acute blood loss anemia  Diarrhea: c diff negative.   Cough: CXR shows nonspecific findings  Chronic dysphagia: modified diet per speech  COPD (severe, per patient): no wheeze  Debility: SNF once medically stable  Protein calorie malnutrition  Rheumatoid arthritis on chronic prednisone  Code Status: full Family Communication: son 3/12 Disposition Plan: SNF   Consultants:  Vascular surgery- Dr Donnetta Hutching  Procedures:  arteriogram  Antibiotics:  vanc started on 3/7   cipro 3/11  HPI/Subjective: Pain better controlled with increase of IV dilaudid  Objective: Filed Vitals:   04/28/13 0851  BP: 114/43  Pulse: 60  Temp: 98.3 F (36.8 C)  Resp: 18    Intake/Output Summary (Last 24 hours) at 04/28/13 1117 Last data filed at 04/28/13 0653  Gross per 24 hour  Intake 2999.17 ml  Output    350 ml  Net 2649.17 ml   Filed Weights   04/26/13 0434 04/26/13 2050 04/27/13 2048  Weight: 60.4 kg (133 lb 2.5 oz) 58.9 kg (129 lb 13.6 oz) 56.972 kg (125 lb 9.6 oz)    Exam:  General: awake. Weak appearing.  Cardiovascular: RRR, nl S1 s2 Respiratory: CTA without WRR, wet cough occasionally Abdomen: S, NT, ND Extremities: erythema less intense, less indurated. Tender.  Data  Reviewed: Basic Metabolic Panel:  Recent Labs Lab 04/23/13 0730 04/24/13 0600 04/26/13 0603 04/27/13 0140 04/28/13 0623  NA 140 138 141 137 138  K 3.4* 3.4* 2.7* 3.4* 3.8  CL 100 102 105 103 104  CO2 22 16* 25 24 22   GLUCOSE 88 90 152* 127* 87  BUN 24* 24* 25* 26* 21  CREATININE 0.81 0.71 0.70 0.83 0.75  CALCIUM 9.0 8.8 8.4 7.9* 7.7*  MG  --   --  1.8  --   --    Liver Function Tests: No results found for this basename: AST, ALT, ALKPHOS, BILITOT, PROT, ALBUMIN,  in the last 168 hours No results found for this basename: LIPASE, AMYLASE,  in the last 168 hours No results found for this basename: AMMONIA,  in the last 168 hours CBC:  Recent Labs Lab 04/23/13 0730 04/24/13 0600 04/26/13 0603  04/26/13 1922 04/27/13 0140 04/27/13 0905 04/27/13 1841 04/28/13 0623  WBC 14.2* 12.2* 9.9  --   --   --   --   --   --   HGB 13.3 13.8 11.9*  < > 11.7* 9.7* 8.8* 8.7* 7.4*  HCT 37.4* 39.3 32.9*  < > 32.3* 26.8* 24.4* 24.2* 21.1*  MCV 93.5 93.1 89.9  --   --   --   --   --   --   PLT 129* 151 196  --   --   --   --   --   --   < > = values in this interval not displayed. Cardiac Enzymes: No results found for this basename: CKTOTAL, CKMB, CKMBINDEX,  TROPONINI,  in the last 168 hours BNP (last 3 results) No results found for this basename: PROBNP,  in the last 8760 hours CBG: No results found for this basename: GLUCAP,  in the last 168 hours  Recent Results (from the past 240 hour(s))  CULTURE, BLOOD (ROUTINE X 2)     Status: None   Collection Time    04/20/13  9:15 PM      Result Value Ref Range Status   Specimen Description BLOOD RIGHT HAND   Final   Special Requests BOTTLES DRAWN AEROBIC ONLY 4CC   Final   Culture  Setup Time     Final   Value: 04/21/2013 13:08     Performed at [facility]   Culture     Final   Value: NO GROWTH 5 DAYS     Performed at [facility]   Report Status 04/27/2013 FINAL   Final  CULTURE, BLOOD (ROUTINE X 2)     Status: None    Collection Time    04/20/13  9:26 PM      Result Value Ref Range Status   Specimen Description BLOOD LEFT HAND   Final   Special Requests BOTTLES DRAWN AEROBIC ONLY 3CC   Final   Culture  Setup Time     Final   Value: 04/21/2013 13:08     Performed at [facility]   Culture     Final   Value: NO GROWTH 5 DAYS     Performed at [facility]   Report Status 04/27/2013 FINAL   Final  CLOSTRIDIUM DIFFICILE BY PCR     Status: None   Collection Time    04/25/13  1:59 PM      Result Value Ref Range Status   C difficile by pcr NEGATIVE  NEGATIVE Final     Studies: No results found.  Scheduled Meds: . ciprofloxacin  400 mg Intravenous Q12H  . diazepam  2 mg Oral QHS  . mometasone-formoterol  2 puff Inhalation BID  . pantoprazole (PROTONIX) IV  40 mg Intravenous Q12H  . potassium chloride  40 mEq Oral Once  . prednisoLONE  5 mg Oral Q breakfast  . sertraline  50 mg Oral Daily  . tiotropium  18 mcg Inhalation Daily  . traZODone  50 mg Oral QHS  . vancomycin  1,250 mg Intravenous Q24H   Continuous Infusions: . 0.9 % NaCl with KCl 40 mEq / L 100 mL/hr at 04/28/13 0128   Time spent: 35 min  Eulogio Bear  [facility] Pager [phone]. If 7PM-7AM, please contact night-coverage at [email or web address], password [facility] 04/28/2013, 11:17 AM  LOS: 8 days

## 2013-04-28 NOTE — Clinical Social Work Note (Signed)
FL2 on chart for MD signature.  Liz Beach, [place], [place], [phone]

## 2013-04-28 NOTE — Progress Notes (Signed)
Subjective: Abdominal pain improving. No further bleeding. Tolerating liquid diet.  Objective: Vital signs in last 24 hours: Temp:  [97.5 F (36.4 C)-98.9 F (37.2 C)] 97.5 F (36.4 C) (03/15 1251) Pulse Rate:  [60-92] 69 (03/15 1251) Resp:  [12-20] 16 (03/15 1251) BP: (105-140)/(40-92) 128/50 mmHg (03/15 1251) SpO2:  [90 %-100 %] 97 % (03/15 1251) Weight:  [56.972 kg (125 lb 9.6 oz)] 56.972 kg (125 lb 9.6 oz) (03/14 2048) Weight change: -1.928 kg (-4 lb 4 oz) Last BM Date: 04/27/13  PE: GEN:  Chronically ill-appearing, NAD ABD:  Soft, mild periumbilical tenderness, active bowel sounds.  Lab Results: CBC    Component Value Date/Time   WBC 9.9 04/26/2013 0603   RBC 3.66* 04/26/2013 0603   RBC 2.92* 08/17/2011 0645   HGB 7.4* 04/28/2013 0623   HCT 21.1* 04/28/2013 0623   PLT 196 04/26/2013 0603   MCV 89.9 04/26/2013 0603   MCH 32.5 04/26/2013 0603   MCHC 36.2* 04/26/2013 0603   RDW 13.5 04/26/2013 0603   LYMPHSABS 0.7 01/31/2012 2006   MONOABS 0.3 01/31/2012 2006   EOSABS 0.0 01/31/2012 2006   BASOSABS 0.0 01/31/2012 2006   CMP     Component Value Date/Time   NA 138 04/28/2013 0623   K 3.8 04/28/2013 0623   CL 104 04/28/2013 0623   CO2 22 04/28/2013 0623   GLUCOSE 87 04/28/2013 0623   BUN 21 04/28/2013 0623   CREATININE 0.75 04/28/2013 0623   CALCIUM 7.7* 04/28/2013 0623   PROT 5.9* 04/21/2013 0615   ALBUMIN 2.5* 04/21/2013 0615   AST 44* 04/21/2013 0615   ALT 10 04/21/2013 0615   ALKPHOS 79 04/21/2013 0615   BILITOT 1.2 04/21/2013 0615   GFRNONAA 87* 04/28/2013 0623   GFRAA >90 04/28/2013 0100   Assessment:  1.  Melena.  Resolved. 2.  Duodenal ulcers.  NSAID- or ischemic etiologies seem most likely.  H pylori could also be etiology. 3.  Acute blood loss anemia.  Plan:  1.  Protonix 40 mg po bid x 6 weeks, then 40 mg po qd thereafter indefinitely. 2.  Check H. Pylori serologies. 3.  Advance diet slowly. 4.  [facility] will follow.   [name] 04/28/2013, 1:21 PM

## 2013-04-29 ENCOUNTER — Encounter (HOSPITAL_COMMUNITY): Payer: Self-pay | Admitting: Gastroenterology

## 2013-04-29 DIAGNOSIS — I739 Peripheral vascular disease, unspecified: Secondary | ICD-10-CM

## 2013-04-29 DIAGNOSIS — L98499 Non-pressure chronic ulcer of skin of other sites with unspecified severity: Secondary | ICD-10-CM

## 2013-04-29 LAB — BASIC METABOLIC PANEL
BUN: 14 mg/dL (ref 6–23)
CO2: 23 mEq/L (ref 19–32)
Calcium: 7.8 mg/dL — ABNORMAL LOW (ref 8.4–10.5)
Chloride: 105 mEq/L (ref 96–112)
Creatinine, Ser: 0.77 mg/dL (ref 0.50–1.35)
GFR calc Af Amer: >90 mL/min (ref >90)
GFR, EST NON AFRICAN AMERICAN: 86 mL/min — AB (ref >90)
Glucose, Bld: 90 mg/dL (ref 70–99)
POTASSIUM: 4.4 meq/L (ref 3.7–5.3)
SODIUM: 138 meq/L (ref 137–147)

## 2013-04-29 LAB — HEMOGLOBIN AND HEMATOCRIT, BLOOD
HEMATOCRIT: 21 % — AB (ref 39.0–52.0)
Hemoglobin: 7.4 g/dL — ABNORMAL LOW (ref 13.0–17.0)

## 2013-04-29 LAB — PREPARE RBC (CROSSMATCH)

## 2013-04-29 MED ORDER — GABAPENTIN 600 MG PO TABS: 300.0000 mg | ORAL_TABLET | Freq: Three times a day (TID) | ORAL | Status: DC

## 2013-04-29 MED ORDER — GABAPENTIN 300 MG PO CAPS
300.0000 mg | ORAL_CAPSULE | Freq: Three times a day (TID) | ORAL | Status: DC
  Administered 2013-04-29 – 2013-05-07 (×24): 300 mg via ORAL
  Filled 2013-04-29 (×28): qty 1

## 2013-04-29 MED ORDER — OXYCODONE-ACETAMINOPHEN 5-325 MG PO TABS
1.0000 | ORAL_TABLET | Freq: Four times a day (QID) | ORAL | Status: DC | PRN
  Administered 2013-04-29 – 2013-05-07 (×16): 1 via ORAL
  Filled 2013-04-29 (×16): qty 1

## 2013-04-29 NOTE — Progress Notes (Signed)
Hemoglobin stable over the past 24 hours. His BUN has dropped from 21 to a current level of 14. No acute GI complaints. Patient denies use of aspirin or nonsteroidal anti-inflammatory drugs prior to admission; his serologic test for Helicobacter pylori infection is pending at this time.  Impression: No evidence of further bleeding  Plan: Continue current management while awaiting results of Helicobacter pylori infection. If positive, I would treat for that organism.  Cleotis Nipper, M.D. [phone]

## 2013-04-29 NOTE — Progress Notes (Signed)
ANTIBIOTIC CONSULT NOTE - Follow up Pharmacy Consult for vancomycin Indication: cellulitis  Allergies  Allergen Reactions  . Lipitor [Atorvastatin Calcium] Other (See Comments)    Myalgias "don't remember how bad" (01/31/2012)  . Penicillins Other (See Comments)    Passed out     Patient Measurements: Height: 5' 8.9" (175 cm) Weight: 124 lb 9 oz (56.5 kg) IBW/kg (Calculated) : 70.47  Vital Signs: Temp: 99.5 F (37.5 C) (03/10 1330) Temp src: Oral (03/10 1330) BP: 145/58 mmHg (03/10 1330) Pulse Rate: 95 (03/10 1330) Intake/Output from previous day: 03/09 0701 - 03/10 0700 In: 0  Out: 500 [Urine:500] Intake/Output from this shift: Total I/O In: 240 [P.O.:240] Out: 1 [Stool:1   Labs:  Recent Labs  04/20/13 1750 04/21/13 0615 04/23/13 0730  WBC 14.6* 12.2* 14.2*  HGB 15.6 13.5 13.3  PLT 127* 98* 129*  CREATININE 1.23 1.26 0.81   Estimated Creatinine Clearance: 62 ml/min (by C-G formula based on Cr of 0.81). No results found for this basename: VANCOTROUGH, VANCOPEAK, VANCORANDOM, GENTTROUGH, GENTPEAK, GENTRANDOM, TOBRATROUGH, TOBRAPEAK, TOBRARND, AMIKACINPEAK, AMIKACINTROU, AMIKACIN,  in the last 72 hours   Assessment: 79 year old male on Day #9 IV vancomycin and D#5 IV cipro for R lower extremity cellulitis with draining R. S/p aortogram 3/12. Still intensely erythematous. Will continue abx. afeb, wbc 14.2, SrCr 0.81, est. crcl ~ 65 ml/min  VANC 3/7> >  CIPRO 3/11 >>  3/10 VT 6.4 (drawn a little late) on 750 Q 24 > increased to 750 Q 12  3/7 BCx x 2 ngtd 3/12 C-diff PCR - neg  Goal of Therapy:  Vancomycin trough level 10-15 mcg/ml  Plan:  Continue Vancomycin 1250 mg IV q24 hours Follow clinical course and LOT  Excell Seltzer, PharmD Clinical Pharmacist  Pager: [phone]   04/29/2013 10:23 AM

## 2013-04-29 NOTE — Progress Notes (Signed)
Utilization review completed.  

## 2013-04-29 NOTE — Progress Notes (Signed)
Patient ID: OVIE CORNELIO, male   DOB: 1935-01-09, 79 y.o.   MRN: [id number] Continued improvement in her right calf and ankle. Marked resolution of the severe erythema that he had. The patient continues to report to severe pain and multiple occasions. He reports pain in his left and right leg were reports as right leg and right foot are the worst.  Stable from the standpoint of his major upper GI bleed. Hematocrit is 21  Again discussed the right iliac artery occlusion with operation. Doubt that his level ischemia caused the events of his right leg ulcer with severe cellulitis but certainly this does add to his inability to heal. Recommend left to right fem-fem bypass. No overt infections feel that this would be acceptable at any time. Plan for surgery on Wednesday March to 18. Patient understands procedure. Will transfuse prior to surgery with his hematocrit of 21

## 2013-04-29 NOTE — Progress Notes (Signed)
Physical Therapy Treatment Patient Details Name: [name] MRN: [id number] DOB: 13-Oct-1934 Today's Date: 04/29/2013 Time: 7371-0626 PT Time Calculation (min): 25 min  PT Assessment / Plan / Recommendation  History of Present Illness [name] is a 79 y.o. male who presents to the ED with RLE swelling and erythema x 1 week.  Progressively worsening.  Worse with movement.  There are ulcers present around his ankle.  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  Will need SNF level rehab following d/c.  Will follow as able/tolerated.  Follow Up Recommendations  SNF;Supervision/Assistance - 24 hour     Does the patient have the potential to tolerate intense rehabilitation   No  Barriers to Discharge  None      Equipment Recommendations  None recommended by PT    Recommendations for Other Services  None  Frequency Min 2X/week   Progress towards PT Goals Progress towards PT goals: Progressing toward goals  Plan Current plan remains appropriate;Frequency needs to be updated    Precautions / Restrictions Precautions Precautions: Fall Precaution Comments: right knee fused   Pertinent Vitals/Pain C/o pain generalized, but worse right LE    Mobility  Bed Mobility Overal bed mobility: Needs Assistance Bed Mobility: Rolling Rolling: Mod assist General bed mobility comments: cue for useing rail, pt not willing to sit up due to severe right LE pain with surgery planned on Wednesday.      Exercises General Exercises - Lower Extremity Ankle Circles/Pumps: AROM;Left;10 reps;Supine Heel Slides: AROM;Left;10 reps;Supine Low Level/ICU Exercises Stabilized Bridging: AAROM;Left;10 reps;Supine (1/2 bridge on left)     PT Goals (current goals can now be found in the care plan section)    Visit Information  Last  PT Received On: 04/29/13 Assistance Needed: +2 (for OOB) History of Present Illness: [name] is a 79 y.o. male who presents to the ED with RLE swelling and erythema x 1 week.  Progressively worsening.  Worse with movement.  There are ulcers present around his ankle.  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    Subjective Data      Cognition  Cognition Arousal/Alertness: Awake/alert Behavior During Therapy: WFL for tasks assessed/performed Overall Cognitive Status: Within Functional Limits for tasks assessed General Comments: aware of problem with incontinence and asking for help to prevent more skin breakdown on bottom    Balance     End of Session PT - End of Session Equipment Utilized During Treatment: Gait belt Activity Tolerance: Patient limited by pain Patient left: in bed;with call bell/phone within reach   GP     [facility] 04/29/2013, 3:58 PM [place], [place] 04/29/2013

## 2013-04-29 NOTE — Progress Notes (Signed)
[facility] PROGRESS NOTE  [name] OXB:[id number] DOB: 24-Nov-1934 DOA: 04/20/2013 PCP: Simona Huh, MD  Assessment/Plan: GI bleed.  -Dr. Paulita Fujita: EGD 3/14 : Bleeding highly likely from duodenal ulcers; etiology unclear but ischemic ulcers (given his significant peripheral vascular disease) is a consideration -Continue empiric protonix.   Continue serial H&H. Transfuse when necessary. Blood pressure remain stable.  Cellulitis:   L. Leg wound with calf ulceration and sepsis syndrome Slowly improving on vancomycin and Cipro. Await plans from Dr. Donnetta Hutching regarding need for revascularization.  Pain -dilaudid PRN -added neurontin  PAD: per vascular  Hypokalemia: Improving but still low. Continue repletion IV.   Acute blood loss anemia  Diarrhea: c diff negative.   Cough: CXR shows nonspecific findings  Chronic dysphagia: modified diet per speech  COPD (severe, per patient): no wheeze  Debility: SNF once medically stable  Protein calorie malnutrition  Rheumatoid arthritis on chronic prednisone  Code Status: full Family Communication:  patient Disposition Plan: SNF   Consultants:  Vascular surgery- Dr Donnetta Hutching  GI  Procedures:  arteriogram  Antibiotics:  vanc started on 3/7   cipro 3/11  HPI/Subjective: Able to sleep last PM   Objective: Filed Vitals:   04/29/13 0925  BP: 107/45  Pulse: 82  Temp: 98.1 F (36.7 C)  Resp: 19    Intake/Output Summary (Last 24 hours) at 04/29/13 1258 Last data filed at 04/29/13 0300  Gross per 24 hour  Intake 2128.75 ml  Output    350 ml  Net 1778.75 ml   Filed Weights   04/26/13 2050 04/27/13 2048 04/28/13 2036  Weight: 58.9 kg (129 lb 13.6 oz) 56.972 kg (125 lb 9.6 oz) 57.3 kg (126 lb 5.2 oz)    Exam:  General: awake. Weak appearing.  Cardiovascular: RRR, nl S1 s2 Respiratory: CTA without WRR, wet cough occasionally Abdomen: S, NT, ND Extremities: erythema less intense, less indurated.  Tender.  Data Reviewed: Basic Metabolic Panel:  Recent Labs Lab 04/24/13 0600 04/26/13 0603 04/27/13 0140 04/28/13 0623 04/29/13 0540  NA 138 141 137 138 138  K 3.4* 2.7* 3.4* 3.8 4.4  CL 102 105 103 104 105  CO2 16* 25 24 22 23   GLUCOSE 90 152* 127* 87 90  BUN 24* 25* 26* 21 14  CREATININE 0.71 0.70 0.83 0.75 0.77  CALCIUM 8.8 8.4 7.9* 7.7* 7.8*  MG  --  1.8  --   --   --    Liver Function Tests: No results found for this basename: AST, ALT, ALKPHOS, BILITOT, PROT, ALBUMIN,  in the last 168 hours No results found for this basename: LIPASE, AMYLASE,  in the last 168 hours No results found for this basename: AMMONIA,  in the last 168 hours CBC:  Recent Labs Lab 04/23/13 0730 04/24/13 0600 04/26/13 0603  04/27/13 0905 04/27/13 1841 04/28/13 0623 04/28/13 1935 04/29/13 0540  WBC 14.2* 12.2* 9.9  --   --   --   --   --   --   HGB 13.3 13.8 11.9*  < > 8.8* 8.7* 7.4* 8.5* 7.4*  HCT 37.4* 39.3 32.9*  < > 24.4* 24.2* 21.1* 24.5* 21.0*  MCV 93.5 93.1 89.9  --   --   --   --   --   --   PLT 129* 151 196  --   --   --   --   --   --   < > = values in this interval not displayed. Cardiac Enzymes: No results found  for this basename: CKTOTAL, CKMB, CKMBINDEX, TROPONINI,  in the last 168 hours BNP (last 3 results) No results found for this basename: PROBNP,  in the last 8760 hours CBG: No results found for this basename: GLUCAP,  in the last 168 hours  Recent Results (from the past 240 hour(s))  CULTURE, BLOOD (ROUTINE X 2)     Status: None   Collection Time    04/20/13  9:15 PM      Result Value Ref Range Status   Specimen Description BLOOD RIGHT HAND   Final   Special Requests BOTTLES DRAWN AEROBIC ONLY 4CC   Final   Culture  Setup Time     Final   Value: 04/21/2013 13:08     Performed at [facility]   Culture     Final   Value: NO GROWTH 5 DAYS     Performed at [facility]   Report Status 04/27/2013 FINAL   Final  CULTURE, BLOOD (ROUTINE X 2)      Status: None   Collection Time    04/20/13  9:26 PM      Result Value Ref Range Status   Specimen Description BLOOD LEFT HAND   Final   Special Requests BOTTLES DRAWN AEROBIC ONLY 3CC   Final   Culture  Setup Time     Final   Value: 04/21/2013 13:08     Performed at [facility]   Culture     Final   Value: NO GROWTH 5 DAYS     Performed at [facility]   Report Status 04/27/2013 FINAL   Final  CLOSTRIDIUM DIFFICILE BY PCR     Status: None   Collection Time    04/25/13  1:59 PM      Result Value Ref Range Status   C difficile by pcr NEGATIVE  NEGATIVE Final     Studies: No results found.  Scheduled Meds: . ciprofloxacin  400 mg Intravenous Q12H  . diazepam  2 mg Oral QHS  . gabapentin  300 mg Oral TID  . mometasone-formoterol  2 puff Inhalation BID  . pantoprazole  40 mg Oral BID AC  . potassium chloride  40 mEq Oral Once  . prednisoLONE  5 mg Oral Q breakfast  . sertraline  50 mg Oral Daily  . tiotropium  18 mcg Inhalation Daily  . traZODone  50 mg Oral QHS  . vancomycin  1,250 mg Intravenous Q24H   Continuous Infusions:   Time spent: 35 min  VANN, JESSICA  [facility] Pager [phone]. If 7PM-7AM, please contact night-coverage at [email or web address], password [facility] 04/29/2013, 12:58 PM  LOS: 9 days

## 2013-04-30 LAB — BASIC METABOLIC PANEL
BUN: 10 mg/dL (ref 6–23)
BUN: 10 mg/dL (ref 6–23)
CALCIUM: 8.1 mg/dL — AB (ref 8.4–10.5)
CO2: 25 mEq/L (ref 19–32)
CO2: 25 mEq/L (ref 19–32)
CREATININE: 0.77 mg/dL (ref 0.50–1.35)
Calcium: 8.5 mg/dL (ref 8.4–10.5)
Chloride: 101 mEq/L (ref 96–112)
Chloride: 98 mEq/L (ref 96–112)
Creatinine, Ser: 0.76 mg/dL (ref 0.50–1.35)
GFR calc Af Amer: >90 mL/min (ref >90)
GFR calc non Af Amer: 86 mL/min — ABNORMAL LOW (ref >90)
GFR, EST NON AFRICAN AMERICAN: 86 mL/min — AB (ref >90)
Glucose, Bld: 95 mg/dL (ref 70–99)
Glucose, Bld: 94 mg/dL (ref 70–99)
Potassium: 4.2 mEq/L (ref 3.7–5.3)
Potassium: 4.3 mEq/L (ref 3.7–5.3)
SODIUM: 135 meq/L — AB (ref 137–147)
Sodium: 136 mEq/L — ABNORMAL LOW (ref 137–147)

## 2013-04-30 LAB — TYPE AND SCREEN
ABO/RH(D): O NEG
Antibody Screen: NEGATIVE
Unit division: 0
Unit division: 0

## 2013-04-30 LAB — CBC
HCT: 31.2 % — ABNORMAL LOW (ref 39.0–52.0)
Hemoglobin: 10.8 g/dL — ABNORMAL LOW (ref 13.0–17.0)
MCH: 32.1 pg (ref 26.0–34.0)
MCHC: 34.6 g/dL (ref 30.0–36.0)
MCV: 92.9 fL (ref 78.0–100.0)
PLATELETS: 214 10*3/uL (ref 150–400)
RBC: 3.36 MIL/uL — AB (ref 4.22–5.81)
RDW: 15.4 % (ref 11.5–15.5)
WBC: 14.3 10*3/uL — ABNORMAL HIGH (ref 4.0–10.5)

## 2013-04-30 LAB — PREPARE RBC (CROSSMATCH)

## 2013-04-30 NOTE — Progress Notes (Signed)
[facility] PROGRESS NOTE  [name] WVP:[id number] DOB: 1934/09/07 DOA: 04/20/2013 PCP: Simona Huh, MD  Assessment/Plan: GI bleed.  -Dr. Paulita Fujita: EGD 3/14 : Bleeding highly likely from duodenal ulcers; etiology unclear but ischemic ulcers (given his significant peripheral vascular disease) is a consideration -Continue empiric protonix.   S/p 2 units  Cellulitis:   L. Leg wound with calf ulceration and sepsis syndrome Slowly improving on vancomycin and Cipro. Await plans from Dr. Donnetta Hutching regarding need for revascularization.  Pain -dilaudid PRN -added neurontin ? improvement  PAD: per vascular  Hypokalemia: Improving but still low. Continue repletion IV.   Acute blood loss anemia  Diarrhea: c diff negative.   Cough: CXR shows nonspecific findings  Chronic dysphagia: modified diet per speech  COPD (severe, per patient): no wheeze  Debility: SNF once medically stable  Protein calorie malnutrition  Rheumatoid arthritis on chronic prednisone  Code Status: full Family Communication:  patient Disposition Plan: SNF   Consultants:  Vascular surgery- Dr Donnetta Hutching  GI  Procedures:  arteriogram  Antibiotics:  vanc started on 3/7   cipro 3/11  HPI/Subjective: Still c/o pain- appears more comfortable   Objective: Filed Vitals:   04/30/13 1128  BP: 115/52  Pulse:   Temp: 98.8 F (37.1 C)  Resp: 18    Intake/Output Summary (Last 24 hours) at 04/30/13 1151 Last data filed at 04/30/13 1114  Gross per 24 hour  Intake    235 ml  Output    575 ml  Net   -340 ml   Filed Weights   04/26/13 2050 04/27/13 2048 04/28/13 2036  Weight: 58.9 kg (129 lb 13.6 oz) 56.972 kg (125 lb 9.6 oz) 57.3 kg (126 lb 5.2 oz)    Exam:  General: awake. Weak appearing.  Cardiovascular: RRR, nl S1 s2 Respiratory: CTA without WRR, wet cough occasionally Abdomen: S, NT, ND Extremities: erythema less intense, less indurated. Tender.  Data Reviewed: Basic Metabolic  Panel:  Recent Labs Lab 04/26/13 0603 04/27/13 0140 04/28/13 5462 04/29/13 0540 04/30/13 0722  NA 141 137 138 138 136*  K 2.7* 3.4* 3.8 4.4 4.2  CL 105 103 104 105 101  CO2 25 24 22 23 25   GLUCOSE 152* 127* 87 90 95  BUN 25* 26* 21 14 10   CREATININE 0.70 0.83 0.75 0.77 0.77  CALCIUM 8.4 7.9* 7.7* 7.8* 8.1*  MG 1.8  --   --   --   --    Liver Function Tests: No results found for this basename: AST, ALT, ALKPHOS, BILITOT, PROT, ALBUMIN,  in the last 168 hours No results found for this basename: LIPASE, AMYLASE,  in the last 168 hours No results found for this basename: AMMONIA,  in the last 168 hours CBC:  Recent Labs Lab 04/24/13 0600 04/26/13 0603  04/27/13 1841 04/28/13 0623 04/28/13 1935 04/29/13 0540 04/30/13 0955  WBC 12.2* 9.9  --   --   --   --   --  14.3*  HGB 13.8 11.9*  < > 8.7* 7.4* 8.5* 7.4* 10.8*  HCT 39.3 32.9*  < > 24.2* 21.1* 24.5* 21.0* 31.2*  MCV 93.1 89.9  --   --   --   --   --  92.9  PLT 151 196  --   --   --   --   --  214  < > = values in this interval not displayed. Cardiac Enzymes: No results found for this basename: CKTOTAL, CKMB, CKMBINDEX, TROPONINI,  in the last 168  hours BNP (last 3 results) No results found for this basename: PROBNP,  in the last 8760 hours CBG: No results found for this basename: GLUCAP,  in the last 168 hours  Recent Results (from the past 240 hour(s))  CULTURE, BLOOD (ROUTINE X 2)     Status: None   Collection Time    04/20/13  9:15 PM      Result Value Ref Range Status   Specimen Description BLOOD RIGHT HAND   Final   Special Requests BOTTLES DRAWN AEROBIC ONLY 4CC   Final   Culture  Setup Time     Final   Value: 04/21/2013 13:08     Performed at [facility]   Culture     Final   Value: NO GROWTH 5 DAYS     Performed at [facility]   Report Status 04/27/2013 FINAL   Final  CULTURE, BLOOD (ROUTINE X 2)     Status: None   Collection Time    04/20/13  9:26 PM      Result Value Ref Range  Status   Specimen Description BLOOD LEFT HAND   Final   Special Requests BOTTLES DRAWN AEROBIC ONLY 3CC   Final   Culture  Setup Time     Final   Value: 04/21/2013 13:08     Performed at [facility]   Culture     Final   Value: NO GROWTH 5 DAYS     Performed at [facility]   Report Status 04/27/2013 FINAL   Final  CLOSTRIDIUM DIFFICILE BY PCR     Status: None   Collection Time    04/25/13  1:59 PM      Result Value Ref Range Status   C difficile by pcr NEGATIVE  NEGATIVE Final     Studies: No results found.  Scheduled Meds: . ciprofloxacin  400 mg Intravenous Q12H  . diazepam  2 mg Oral QHS  . gabapentin  300 mg Oral TID  . mometasone-formoterol  2 puff Inhalation BID  . pantoprazole  40 mg Oral BID AC  . potassium chloride  40 mEq Oral Once  . prednisoLONE  5 mg Oral Q breakfast  . sertraline  50 mg Oral Daily  . tiotropium  18 mcg Inhalation Daily  . traZODone  50 mg Oral QHS  . vancomycin  1,250 mg Intravenous Q24H   Continuous Infusions:   Time spent: 35 min  VANN, JESSICA  [facility] Pager [phone]. If 7PM-7AM, please contact night-coverage at [email or web address], password [facility] 04/30/2013, 11:51 AM  LOS: 10 days

## 2013-04-30 NOTE — Plan of Care (Signed)
Problem: Phase III Progression Outcomes Goal: Foley discontinued Outcome: Not Applicable Date Met:  57/47/34 Pt has an external catheter, not a foley.

## 2013-04-30 NOTE — Progress Notes (Signed)
Subjective: Interval History: none.. continues to report pain all over. Perhaps less so in his right calf today  Objective: Vital signs in last 24 hours: Temp:  [97.3 F (36.3 C)-98.7 F (37.1 C)] 97.3 F (36.3 C) (03/17 0436) Pulse Rate:  [56-83] 70 (03/17 0436) Resp:  [16-19] 18 (03/17 0436) BP: (107-150)/(38-57) 116/38 mmHg (03/17 0436) SpO2:  [92 %-100 %] 99 % (03/17 0820)  Intake/Output from previous day: 03/16 0701 - 03/17 0700 In: 342.5 [P.O.:330; Blood:12.5] Out: 475 [Urine:475] Intake/Output this shift: Total I/O In: -  Out: 400 [Urine:400]  Erythema continues to resolve and right calf. No change in his wound which are dressed.  Lab Results:  Recent Labs  04/28/13 1935 04/29/13 0540  HGB 8.5* 7.4*  HCT 24.5* 21.0*   BMET  Recent Labs  04/28/13 0623 04/29/13 0540  NA 138 138  K 3.8 4.4  CL 104 105  CO2 22 23  GLUCOSE 87 90  BUN 21 14  CREATININE 0.75 0.77  CALCIUM 7.7* 7.8*    Studies/Results: Dg Chest Port 1 View  04/24/2013   CLINICAL DATA Cough and congestion.  EXAM PORTABLE CHEST - 1 VIEW  COMPARISON DG CHEST 2 VIEW dated 01/29/2013; CT CHEST W/CM dated 02/02/2012; DG CHEST 2 VIEW dated 02/01/2012  FINDINGS Mediastinum and hilar structures are normal. Increased interstitial markings noted. Pneumonitis could present in this fashion. Underlying COPD most likely present. Changes of pleural parenchymal scarring noted. Heart size and pulmonary vascularity normal. No pneumothorax. No acute osseous abnormality. Degenerative changes both shoulders and thoracic spine.  IMPRESSION 1. Mild increased interstitial markings suggesting the possibility of interstitial pneumonitis.  2. COPD.  SIGNATURE  Electronically Signed   By: Marcello Moores  Register   On: 04/24/2013 15:14   Dg Swallowing Func-speech Pathology  04/25/2013   Houston Siren, CCC-SLP     04/25/2013 10:47 AM Objective Swallowing Evaluation: Modified Barium Swallowing Study   Patient Details  Name: [name] MRN: [id number] Date of Birth: Oct 20, 1934  Today's Date: 04/25/2013 Time: 5093-2671 SLP Time Calculation (min): 20 min  Past Medical History:  Past Medical History  Diagnosis Date  . Hyperlipidemia   . History of colon polyps   . Spinal stenosis   . Anxiety   . BPH (benign prostatic hypertrophy)   . ED (erectile dysfunction)   . Osteoporosis   . Hypertension   . Chest pain     "I've had it" (01/31/2012)   . Peripheral vascular disease     "right leg is 100% blocked; left leg has stent, still ~ 75%  blocked" (01/31/2012)  . Peripheral neuropathy     "bad" (01/31/2012)  . COPD (chronic obstructive pulmonary disease)   . Emphysema   . Pneumonia 1960's    "double" (01/31/2012)  . BRBPR (bright red blood per rectum)     "don't know what it's from; had some this week" (01/31/2012)  . Cervical spondylosis   . Rheumatoid arthritis(714.0)   . Arthritis     "hands and feet" (01/31/2012)  . DDD (degenerative disc disease), lumbosacral     "S1; L5" (01/31/2012)  . Chronic lower back pain   . Depression     "imagine I've got a mild depression w/what I've gone thru last  month" (01/31/2012)  . Prostate cancer 2004    "had 40 treatments of radiation" (01/31/2012)  . Breast cancer in male     "left" (01/31/2012)  . Basal cell carcinoma of nasal tip     "  .  Pernicious anemia   . GERD (gastroesophageal reflux disease)   . Polyneuropathy in other diseases classified elsewhere 11/06/2012   . Lumbosacral spondylosis without myelopathy 11/06/2012   Past Surgical History:  Past Surgical History  Procedure Laterality Date  . Basal cell carcinoma excision      Nose x 3  . Cholecystectomy  ?1990's  . Wrist fusion      Right Wrist; "3 OR's; it's fused" (01/31/2012)  . Excisional hemorrhoidectomy  1960's  . Knee surgery      "right; X 6 surgeries; went in for simple cartilage OR; ended  up w/fused knee" (01/31/2012)  . Iliac artery stent  12-07-09    Stent done by Dr. Gwenlyn Found  . Total hip arthroplasty  08/16/2011    Procedure: TOTAL HIP  ARTHROPLASTY ANTERIOR APPROACH;  Surgeon:  Mcarthur Rossetti, MD;  Location: [place] [facility];  Service:  Orthopedics;  Laterality: Right;  Right total hip replacement  . Cervical disc surgery      "7 total; ended w/a fusion" (01/31/2012)  . Esophagogastroduodenoscopy  02/22/2012    Procedure: ESOPHAGOGASTRODUODENOSCOPY (EGD);  Surgeon: Arta Silence, MD;  Location: [facility];  Service: Endoscopy;   Laterality: N/A;  . Eus  02/22/2012    Procedure: UPPER ENDOSCOPIC ULTRASOUND (EUS) RADIAL;  Surgeon:  Arta Silence, MD;  Location: [facility];  Service: Endoscopy;   Laterality: N/A;  . Hernia repair    . Transurethral resection of prostate     HPI:  79 y/o male with chief complaint of right leg redness, sores and  pain.Dx Cellulitis with draining L. Leg wound and sepsis .   History of ACDF, significant COPD/emphesema, cervical  spondylosis.  RN reports increased coughing with meds, swallow  eval ordered.  CXR  Mild increased interstitial markings  suggesting the possibility of interstitial pneumonitis.     Assessment / Plan / Recommendation Clinical Impression  Dysphagia Diagnosis: Mild oral phase dysphagia;Mild pharyngeal  phase dysphagia;Moderate pharyngeal phase dysphagia Clinical impression: Pt. exhibited mild oral impairment  characterized by prolonged mastication likely due to dentures not  present (lower plate broken).  Pharyngeal phase evidenced by  sensory deficits that is characteristic of pt.'s with COPD.   Decreased pharyngeal sensation led to late swallow initiating at  pyriform sinsues with flash laryngeal penetration of cup sip  thin.  Moderate amount of aspiration with straw sips thin given  increased velocity and sip size with reflexive cough.  SLP  recommending Dys 2 diet texture and thin liquids, NO straws,  pills whole in applesauce, small sips sitting upright and rest  breaks when needed.  ST will continue to follow.     Treatment Recommendation  Therapy as outlined in treatment plan below    Diet  Recommendation Dysphagia 2 (Fine chop);Thin liquid   Liquid Administration via: No straw;Cup Medication Administration: Whole meds with puree Supervision: Patient able to self feed Compensations: Slow rate;Small sips/bites (rest breaks) Postural Changes and/or Swallow Maneuvers: Seated upright 90  degrees    Other  Recommendations Oral Care Recommendations: Oral care BID   Follow Up Recommendations  None    Frequency and Duration min 2x/week  1 week   Pertinent Vitals/Pain WDL            Reason for Referral Objectively evaluate swallowing function   Oral Phase Oral Preparation/Oral Phase Oral Phase: Impaired Oral - Solids Oral - Regular: Delayed oral transit;Weak lingual manipulation   Pharyngeal Phase Pharyngeal Phase Pharyngeal Phase: Impaired Pharyngeal - Nectar Pharyngeal - Nectar Teaspoon:  Delayed swallow  initiation;Premature spillage to pyriform sinuses Pharyngeal - Nectar Cup: Delayed swallow initiation;Premature  spillage to pyriform sinuses Pharyngeal - Thin Pharyngeal - Thin Teaspoon: Delayed swallow initiation;Premature  spillage to pyriform sinuses Pharyngeal - Thin Cup: Delayed swallow initiation;Premature  spillage to pyriform sinuses;Penetration/Aspiration during  swallow Penetration/Aspiration details (thin cup): Material enters  airway, remains ABOVE vocal cords then ejected out Pharyngeal - Thin Straw: Penetration/Aspiration during  swallow;Delayed swallow initiation;Premature spillage to pyriform  sinuses;Moderate aspiration;Reduced airway/laryngeal closure Penetration/Aspiration details (thin straw): Material enters  airway, passes BELOW cords and not ejected out despite cough  attempt by patient Pharyngeal - Solids Pharyngeal - Regular: Within functional limits  Cervical Esophageal Phase    GO    Cervical Esophageal Phase Cervical Esophageal Phase: [facility]         Cranford Mon.Ed CCC-SLP Pager [phone]  04/25/2013   Anti-infectives: Anti-infectives   Start     Dose/Rate Route Frequency  Ordered Stop   04/27/13 0600  vancomycin (VANCOCIN) 1,250 mg in sodium chloride 0.9 % 250 mL IVPB     1,250 mg 166.7 mL/hr over 90 Minutes Intravenous Every 24 hours 04/26/13 2036     04/24/13 1430  ciprofloxacin (CIPRO) IVPB 400 mg     400 mg 200 mL/hr over 60 Minutes Intravenous Every 12 hours 04/24/13 1323     04/24/13 0800  vancomycin (VANCOCIN) IVPB 750 mg/150 ml premix  Status:  Discontinued     750 mg 150 mL/hr over 60 Minutes Intravenous Every 12 hours 04/23/13 2202 04/26/13 2036   04/21/13 1900  vancomycin (VANCOCIN) IVPB 750 mg/150 ml premix  Status:  Discontinued     750 mg 150 mL/hr over 60 Minutes Intravenous Every 24 hours 04/20/13 2034 04/23/13 2202   04/20/13 1800  vancomycin (VANCOCIN) IVPB 1000 mg/200 mL premix     1,000 mg 200 mL/hr over 60 Minutes Intravenous  Once 04/20/13 1748 04/20/13 1924      Assessment/Plan: s/p Procedure(s): ESOPHAGOGASTRODUODENOSCOPY (EGD) (Left) Plan for left to right fem-fem bypass tomorrow. Discussed of procedure with the patient.   LOS: 10 days   Asna Muldrow 04/30/2013, 8:39 AM

## 2013-05-01 ENCOUNTER — Encounter (HOSPITAL_COMMUNITY)
Admission: EM | Disposition: A | Discharge status: Skilled Nursing Facility | Payer: Self-pay | Source: Home / Self Care | Attending: Vascular Surgery

## 2013-05-01 ENCOUNTER — Encounter (HOSPITAL_COMMUNITY): Payer: Self-pay | Admitting: Anesthesiology

## 2013-05-01 ENCOUNTER — Encounter (HOSPITAL_COMMUNITY): Payer: Medicare Other | Admitting: Certified Registered Nurse Anesthetist

## 2013-05-01 ENCOUNTER — Inpatient Hospital Stay (HOSPITAL_COMMUNITY): Payer: Medicare Other | Admitting: Certified Registered Nurse Anesthetist

## 2013-05-01 DIAGNOSIS — J441 Chronic obstructive pulmonary disease with (acute) exacerbation: Secondary | ICD-10-CM

## 2013-05-01 HISTORY — PX: FEMORAL-FEMORAL BYPASS GRAFT: SHX936

## 2013-05-01 LAB — BASIC METABOLIC PANEL
BUN: 9 mg/dL (ref 6–23)
CHLORIDE: 100 meq/L (ref 96–112)
CO2: 26 mEq/L (ref 19–32)
Calcium: 8.3 mg/dL — ABNORMAL LOW (ref 8.4–10.5)
Creatinine, Ser: 0.87 mg/dL (ref 0.50–1.35)
GFR, EST NON AFRICAN AMERICAN: 82 mL/min — AB (ref >90)
Glucose, Bld: 109 mg/dL — ABNORMAL HIGH (ref 70–99)
POTASSIUM: 3.7 meq/L (ref 3.7–5.3)
Sodium: 136 mEq/L — ABNORMAL LOW (ref 137–147)

## 2013-05-01 LAB — CBC
HCT: 29.8 % — ABNORMAL LOW (ref 39.0–52.0)
Hemoglobin: 10.3 g/dL — ABNORMAL LOW (ref 13.0–17.0)
MCH: 32.1 pg (ref 26.0–34.0)
MCHC: 34.6 g/dL (ref 30.0–36.0)
MCV: 92.8 fL (ref 78.0–100.0)
PLATELETS: 182 10*3/uL (ref 150–400)
RBC: 3.21 MIL/uL — ABNORMAL LOW (ref 4.22–5.81)
RDW: 15.1 % (ref 11.5–15.5)
WBC: 8.4 10*3/uL (ref 4.0–10.5)

## 2013-05-01 LAB — TYPE AND SCREEN
ABO/RH(D): O NEG
Antibody Screen: NEGATIVE
Unit division: 0

## 2013-05-01 LAB — H. PYLORI ANTIBODY, IGG: H PYLORI IGG: 0.87 {ISR}

## 2013-05-01 LAB — SURGICAL PCR SCREEN
MRSA, PCR: NEGATIVE
Staphylococcus aureus: NEGATIVE

## 2013-05-01 SURGERY — CREATION, BYPASS, ARTERIAL, FEMORAL TO FEMORAL, USING GRAFT
Anesthesia: General | Site: Groin | Laterality: Bilateral

## 2013-05-01 MED ORDER — ONDANSETRON HCL 4 MG/2ML IJ SOLN
INTRAMUSCULAR | Status: AC
  Filled 2013-05-01: qty 2

## 2013-05-01 MED ORDER — SODIUM CHLORIDE 0.9 % IR SOLN
Status: DC | PRN
  Administered 2013-05-01: 09:00:00

## 2013-05-01 MED ORDER — ROCURONIUM BROMIDE 100 MG/10ML IV SOLN
INTRAVENOUS | Status: DC | PRN
  Administered 2013-05-01: 50 mg via INTRAVENOUS
  Administered 2013-05-01: 10 mg via INTRAVENOUS

## 2013-05-01 MED ORDER — FENTANYL CITRATE 0.05 MG/ML IJ SOLN
INTRAMUSCULAR | Status: AC
  Filled 2013-05-01: qty 5

## 2013-05-01 MED ORDER — PROPOFOL 10 MG/ML IV BOLUS
INTRAVENOUS | Status: DC | PRN
  Administered 2013-05-01: 100 mg via INTRAVENOUS

## 2013-05-01 MED ORDER — POTASSIUM CHLORIDE CRYS ER 20 MEQ PO TBCR: 20.0000 meq | EXTENDED_RELEASE_TABLET | Freq: Once | ORAL | Status: AC | PRN

## 2013-05-01 MED ORDER — FENTANYL CITRATE 0.05 MG/ML IJ SOLN
INTRAMUSCULAR | Status: DC | PRN
Start: 2013-05-01 — End: 2013-05-01
  Administered 2013-05-01: 200 ug via INTRAVENOUS
  Administered 2013-05-01: 50 ug via INTRAVENOUS

## 2013-05-01 MED ORDER — OXYCODONE HCL 5 MG PO TABS: 5.0000 mg | ORAL_TABLET | Freq: Once | ORAL | Status: DC | PRN

## 2013-05-01 MED ORDER — ENOXAPARIN SODIUM 40 MG/0.4ML ~~LOC~~ SOLN
40.0000 mg | SUBCUTANEOUS | Status: DC
  Administered 2013-05-02 – 2013-05-07 (×6): 40 mg via SUBCUTANEOUS
  Filled 2013-05-01 (×8): qty 0.4

## 2013-05-01 MED ORDER — OXYCODONE HCL 5 MG/5ML PO SOLN: 5.0000 mg | Freq: Once | ORAL | Status: DC | PRN

## 2013-05-01 MED ORDER — PROTAMINE SULFATE 10 MG/ML IV SOLN
INTRAVENOUS | Status: DC | PRN
  Administered 2013-05-01: 50 mg via INTRAVENOUS

## 2013-05-01 MED ORDER — MIDAZOLAM HCL 5 MG/5ML IJ SOLN
INTRAMUSCULAR | Status: DC | PRN
  Administered 2013-05-01 (×2): 1 mg via INTRAVENOUS

## 2013-05-01 MED ORDER — HYDROMORPHONE HCL PF 1 MG/ML IJ SOLN
0.2500 mg | INTRAMUSCULAR | Status: DC | PRN
  Administered 2013-05-01: 0.5 mg via INTRAVENOUS
  Administered 2013-05-01 (×2): 0.25 mg via INTRAVENOUS

## 2013-05-01 MED ORDER — SODIUM CHLORIDE 0.9 % IJ SOLN
INTRAMUSCULAR | Status: AC
  Filled 2013-05-01: qty 10

## 2013-05-01 MED ORDER — SODIUM CHLORIDE 0.9 % IV SOLN: 500.0000 mL | Freq: Once | INTRAVENOUS | Status: AC | PRN

## 2013-05-01 MED ORDER — METOPROLOL TARTRATE 1 MG/ML IV SOLN: 2.0000 mg | INTRAVENOUS | Status: DC | PRN

## 2013-05-01 MED ORDER — PHENYLEPHRINE HCL 10 MG/ML IJ SOLN
10.0000 mg | INTRAVENOUS | Status: DC | PRN
  Administered 2013-05-01: 15 ug/min via INTRAVENOUS

## 2013-05-01 MED ORDER — LACTATED RINGERS IV SOLN
INTRAVENOUS | Status: DC | PRN
  Administered 2013-05-01: 08:00:00 via INTRAVENOUS

## 2013-05-01 MED ORDER — ONDANSETRON HCL 4 MG/2ML IJ SOLN: 4.0000 mg | Freq: Once | INTRAMUSCULAR | Status: DC | PRN

## 2013-05-01 MED ORDER — MIDAZOLAM HCL 2 MG/2ML IJ SOLN
INTRAMUSCULAR | Status: AC
  Filled 2013-05-01: qty 2

## 2013-05-01 MED ORDER — HYDROMORPHONE HCL PF 1 MG/ML IJ SOLN
INTRAMUSCULAR | Status: AC
  Administered 2013-05-01: 0.5 mg via INTRAVENOUS
  Filled 2013-05-01: qty 1

## 2013-05-01 MED ORDER — PROPOFOL 10 MG/ML IV BOLUS
INTRAVENOUS | Status: AC
  Filled 2013-05-01: qty 20

## 2013-05-01 MED ORDER — ONDANSETRON HCL 4 MG/2ML IJ SOLN
INTRAMUSCULAR | Status: DC | PRN
  Administered 2013-05-01: 4 mg via INTRAVENOUS

## 2013-05-01 MED ORDER — 0.9 % SODIUM CHLORIDE (POUR BTL) OPTIME
TOPICAL | Status: DC | PRN
  Administered 2013-05-01: 2000 mL

## 2013-05-01 MED ORDER — LIDOCAINE HCL (CARDIAC) 20 MG/ML IV SOLN
INTRAVENOUS | Status: DC | PRN
  Administered 2013-05-01: 70 mg via INTRAVENOUS

## 2013-05-01 MED ORDER — ACETAMINOPHEN 160 MG/5ML PO SOLN
325.0000 mg | ORAL | Status: DC | PRN
  Filled 2013-05-01: qty 20.3

## 2013-05-01 MED ORDER — ALBUMIN HUMAN 5 % IV SOLN
INTRAVENOUS | Status: DC | PRN
  Administered 2013-05-01: 10:00:00 via INTRAVENOUS

## 2013-05-01 MED ORDER — ACETAMINOPHEN 325 MG PO TABS: 325.0000 mg | ORAL_TABLET | ORAL | Status: DC | PRN

## 2013-05-01 MED ORDER — GLYCOPYRROLATE 0.2 MG/ML IJ SOLN
INTRAMUSCULAR | Status: DC | PRN
  Administered 2013-05-01: .6 mg via INTRAVENOUS

## 2013-05-01 MED ORDER — NEOSTIGMINE METHYLSULFATE 1 MG/ML IJ SOLN
INTRAMUSCULAR | Status: DC | PRN
  Administered 2013-05-01: 3 mg via INTRAVENOUS

## 2013-05-01 MED ORDER — EPHEDRINE SULFATE 50 MG/ML IJ SOLN
INTRAMUSCULAR | Status: AC
  Filled 2013-05-01: qty 1

## 2013-05-01 MED ORDER — ROCURONIUM BROMIDE 50 MG/5ML IV SOLN
INTRAVENOUS | Status: AC
  Filled 2013-05-01: qty 1

## 2013-05-01 MED ORDER — POTASSIUM CHLORIDE CRYS ER 20 MEQ PO TBCR
20.0000 meq | EXTENDED_RELEASE_TABLET | Freq: Once | ORAL | Status: AC
  Administered 2013-05-01: 20 meq via ORAL
  Filled 2013-05-01: qty 1

## 2013-05-01 MED ORDER — LIDOCAINE HCL (CARDIAC) 20 MG/ML IV SOLN
INTRAVENOUS | Status: AC
  Filled 2013-05-01: qty 5

## 2013-05-01 MED ORDER — HEPARIN SODIUM (PORCINE) 1000 UNIT/ML IJ SOLN
INTRAMUSCULAR | Status: DC | PRN
  Administered 2013-05-01: 6000 [IU] via INTRAVENOUS

## 2013-05-01 SURGICAL SUPPLY — 51 items
APL SKNCLS STERI-STRIP NONHPOA (GAUZE/BANDAGES/DRESSINGS) ×1
BENZOIN TINCTURE PRP APPL 2/3 (GAUZE/BANDAGES/DRESSINGS) ×3 IMPLANT
CANISTER SUCTION 2500CC (MISCELLANEOUS) ×3 IMPLANT
CANNULA VESSEL 3MM 2 BLNT TIP (CANNULA) ×3 IMPLANT
CLIP LIGATING EXTRA MED SLVR (CLIP) ×3 IMPLANT
CLIP LIGATING EXTRA SM BLUE (MISCELLANEOUS) ×3 IMPLANT
CLOSURE WOUND 1/2 X4 (GAUZE/BANDAGES/DRESSINGS) ×1
COVER SURGICAL LIGHT HANDLE (MISCELLANEOUS) ×3 IMPLANT
DRAIN SNY 10X20 3/4 PERF (WOUND CARE) IMPLANT
DRAPE WARM FLUID 44X44 (DRAPE) ×3 IMPLANT
DRSG COVADERM 4X10 (GAUZE/BANDAGES/DRESSINGS) IMPLANT
DRSG COVADERM 4X6 (GAUZE/BANDAGES/DRESSINGS) ×4 IMPLANT
ELECT REM PT RETURN 9FT ADLT (ELECTROSURGICAL) ×3
ELECTRODE REM PT RTRN 9FT ADLT (ELECTROSURGICAL) ×1 IMPLANT
EVACUATOR SILICONE 100CC (DRAIN) IMPLANT
GLOVE BIOGEL PI IND STRL 6.5 (GLOVE) IMPLANT
GLOVE BIOGEL PI INDICATOR 6.5 (GLOVE) ×2
GLOVE SS BIOGEL STRL SZ 7.5 (GLOVE) ×1 IMPLANT
GLOVE SUPERSENSE BIOGEL SZ 7.5 (GLOVE) ×2
GLOVE SURG SS PI 6.0 STRL IVOR (GLOVE) ×2 IMPLANT
GLOVE SURG SS PI 7.0 STRL IVOR (GLOVE) ×2 IMPLANT
GOWN STRL REUS W/ TWL LRG LVL3 (GOWN DISPOSABLE) ×3 IMPLANT
GOWN STRL REUS W/ TWL XL LVL3 (GOWN DISPOSABLE) IMPLANT
GOWN STRL REUS W/TWL LRG LVL3 (GOWN DISPOSABLE) ×6
GOWN STRL REUS W/TWL XL LVL3 (GOWN DISPOSABLE) ×3
GRAFT HEMASHIELD 8MM (Vascular Products) ×3 IMPLANT
GRAFT VASC STRG 30X8KNIT (Vascular Products) IMPLANT
KIT BASIN OR (CUSTOM PROCEDURE TRAY) ×3 IMPLANT
KIT ROOM TURNOVER OR (KITS) ×3 IMPLANT
NS IRRIG 1000ML POUR BTL (IV SOLUTION) ×6 IMPLANT
PACK PERIPHERAL VASCULAR (CUSTOM PROCEDURE TRAY) ×3 IMPLANT
PAD ARMBOARD 7.5X6 YLW CONV (MISCELLANEOUS) ×6 IMPLANT
STAPLER VISISTAT 35W (STAPLE) IMPLANT
STRIP CLOSURE SKIN 1/2X4 (GAUZE/BANDAGES/DRESSINGS) ×2 IMPLANT
SUT ETHILON 3 0 PS 1 (SUTURE) IMPLANT
SUT PROLENE 5 0 C 1 24 (SUTURE) ×6 IMPLANT
SUT PROLENE 6 0 CC (SUTURE) ×2 IMPLANT
SUT SILK 2 0 (SUTURE) ×3
SUT SILK 2 0 FS (SUTURE) ×2 IMPLANT
SUT SILK 2 0 SH (SUTURE) IMPLANT
SUT SILK 2-0 18XBRD TIE 12 (SUTURE) IMPLANT
SUT SILK 4 0 (SUTURE) ×3
SUT SILK 4-0 18XBRD TIE 12 (SUTURE) IMPLANT
SUT VIC AB 2-0 CTX 36 (SUTURE) ×6 IMPLANT
SUT VIC AB 3-0 SH 27 (SUTURE) ×6
SUT VIC AB 3-0 SH 27X BRD (SUTURE) ×2 IMPLANT
TOWEL OR 17X24 6PK STRL BLUE (TOWEL DISPOSABLE) ×6 IMPLANT
TOWEL OR 17X26 10 PK STRL BLUE (TOWEL DISPOSABLE) ×5 IMPLANT
TRAY FOLEY CATH 16FRSI W/METER (SET/KITS/TRAYS/PACK) ×3 IMPLANT
UNDERPAD 30X30 INCONTINENT (UNDERPADS AND DIAPERS) ×3 IMPLANT
WATER STERILE IRR 1000ML POUR (IV SOLUTION) ×3 IMPLANT

## 2013-05-01 NOTE — Progress Notes (Addendum)
[facility] PROGRESS NOTE  [name] YTK:[id number] DOB: [birth year] DOA: 04/20/2013 PCP: Simona Huh, MD  Assessment/Plan: GI bleed.  -Dr. Paulita Fujita: EGD 3/14 : Bleeding highly likely from duodenal ulcers; etiology unclear but ischemic ulcers (given his significant peripheral vascular disease) is a consideration -Continue empiric protonix.   S/p 2 units  Cellulitis with open ulcers:   L. Leg wound with calf ulceration and sepsis syndrome Slowly improving on vancomycin and Cipro.   Pain -dilaudid PRN -added neurontin ? improvement  PAD: per vascular, s/p- Left to right femoral-femoral bypass with 8 mm Hemashield graft   Hypokalemia: resolved   Acute blood loss anemia  Diarrhea: c diff negative.   Cough: CXR shows nonspecific findings  Chronic dysphagia: modified diet per speech  COPD (severe, per patient): no wheeze  Debility: SNF once medically stable  Protein calorie malnutrition  Rheumatoid arthritis on chronic prednisone  Code Status: full Family Communication:  patient Disposition Plan: SNF   Consultants:  Vascular surgery- Dr Donnetta Hutching  GI  Procedures: 1.Arteriogram 2. Left to right femoral-femoral bypass with 8 mm Hemashield graft- 3/18   Antibiotics:  vanc started on 3/7   cipro 3/11  HPI/Subjective: Seen in PACU s/p surgery, denies n/v. C/o pain.   Objective: Filed Vitals:   05/01/13 1400  BP: 144/59  Pulse:   Temp:   Resp: 17    Intake/Output Summary (Last 24 hours) at 05/01/13 1427 Last data filed at 05/01/13 1029  Gross per 24 hour  Intake   1880 ml  Output   1445 ml  Net    435 ml   Filed Weights   04/27/13 2048 04/28/13 2036 04/30/13 2044  Weight: 56.972 kg (125 lb 9.6 oz) 57.3 kg (126 lb 5.2 oz) 56.9 kg (125 lb 7.1 oz)    Exam:  General: awake. in NAD.  Cardiovascular: RRR, nl S1 s2 Respiratory: decreased BS at bases, no crackles and no wheezes Abdomen: Soft, +BS,NT/ND, Extremities: no edema, no  cyanosis  Data Reviewed: Basic Metabolic Panel:  Recent Labs Lab 04/26/13 0603  04/28/13 0623 04/29/13 0540 04/30/13 0722 04/30/13 0955 05/01/13 0536  NA 141  < > 138 138 136* 135* 136*  K 2.7*  < > 3.8 4.4 4.2 4.3 3.7  CL 105  < > 104 105 101 98 100  CO2 25  < > 22 23 25 25 26   GLUCOSE 152*  < > 87 90 95 94 109*  BUN 25*  < > 21 14 10 10 9   CREATININE 0.70  < > 0.75 0.77 0.77 0.76 0.87  CALCIUM 8.4  < > 7.7* 7.8* 8.1* 8.5 8.3*  MG 1.8  --   --   --   --   --   --   < > = values in this interval not displayed. Liver Function Tests: No results found for this basename: AST, ALT, ALKPHOS, BILITOT, PROT, ALBUMIN,  in the last 168 hours No results found for this basename: LIPASE, AMYLASE,  in the last 168 hours No results found for this basename: AMMONIA,  in the last 168 hours CBC:  Recent Labs Lab 04/26/13 0603  04/28/13 0623 04/28/13 1935 04/29/13 0540 04/30/13 0955 05/01/13 0536  WBC 9.9  --   --   --   --  14.3* 8.4  HGB 11.9*  < > 7.4* 8.5* 7.4* 10.8* 10.3*  HCT 32.9*  < > 21.1* 24.5* 21.0* 31.2* 29.8*  MCV 89.9  --   --   --   --  92.9 92.8  PLT 196  --   --   --   --  214 182  < > = values in this interval not displayed. Cardiac Enzymes: No results found for this basename: CKTOTAL, CKMB, CKMBINDEX, TROPONINI,  in the last 168 hours BNP (last 3 results) No results found for this basename: PROBNP,  in the last 8760 hours CBG: No results found for this basename: GLUCAP,  in the last 168 hours  Recent Results (from the past 240 hour(s))  CLOSTRIDIUM DIFFICILE BY PCR     Status: None   Collection Time    04/25/13  1:59 PM      Result Value Ref Range Status   C difficile by pcr NEGATIVE  NEGATIVE Final  SURGICAL PCR SCREEN     Status: None   Collection Time    05/01/13  1:52 AM      Result Value Ref Range Status   MRSA, PCR NEGATIVE  NEGATIVE Final   Staphylococcus aureus NEGATIVE  NEGATIVE Final   Comment:            The Xpert SA Assay (FDA     approved  for NASAL specimens     in patients over 66 years of age),     is one component of     a comprehensive surveillance     program.  Test performance has     been validated by [employer] for patients greater     than or equal to 40 year old.     It is not intended     to diagnose infection nor to     guide or monitor treatment.     Studies: No results found.  Scheduled Meds: . ciprofloxacin  400 mg Intravenous Q12H  . diazepam  2 mg Oral QHS  . [START ON 05/02/2013] enoxaparin (LOVENOX) injection  40 mg Subcutaneous Q24H  . gabapentin  300 mg Oral TID  . mometasone-formoterol  2 puff Inhalation BID  . pantoprazole  40 mg Oral BID AC  . potassium chloride  40 mEq Oral Once  . prednisoLONE  5 mg Oral Q breakfast  . sertraline  50 mg Oral Daily  . tiotropium  18 mcg Inhalation Daily  . traZODone  50 mg Oral QHS  . vancomycin  1,250 mg Intravenous Q24H   Continuous Infusions:   Time spent: 25 min  [facility] Pager [phone]. If 7PM-7AM, please contact night-coverage at [email or web address], password [facility] 05/01/2013, 2:27 PM  LOS: 11 days

## 2013-05-01 NOTE — Progress Notes (Signed)
Report given to david rn as cargiver

## 2013-05-01 NOTE — Anesthesia Preprocedure Evaluation (Addendum)
Anesthesia Evaluation  Patient identified by MRN, date of birth, ID band Patient awake    Reviewed: Allergy & Precautions, H&P , NPO status , Patient's Chart, lab work & pertinent test results  Airway Mallampati: II TM Distance: >3 FB Neck ROM: Full    Dental  (+) Edentulous Upper, Edentulous Lower   Pulmonary neg sleep apnea, COPD COPD inhaler, neg recent URI, Current Smoker,  breath sounds clear to auscultation        Cardiovascular hypertension, Pt. on medications + Peripheral Vascular Disease Rhythm:Regular Rate:Normal     Neuro/Psych PSYCHIATRIC DISORDERS Anxiety Depression  Neuromuscular disease    GI/Hepatic Neg liver ROS, GERD-  Medicated and Controlled,  Endo/Other  negative endocrine ROS  Renal/GU negative Renal ROS     Musculoskeletal  (+) Arthritis -, Osteoarthritis,    Abdominal   Peds  Hematology  (+) anemia ,   Anesthesia Other Findings   Reproductive/Obstetrics                          Anesthesia Physical Anesthesia Plan  ASA: III  Anesthesia Plan: General   Post-op Pain Management:    Induction: Intravenous  Airway Management Planned: Oral ETT  Additional Equipment: Arterial line  Intra-op Plan:   Post-operative Plan: Extubation in OR  Informed Consent: I have reviewed the patients History and Physical, chart, labs and discussed the procedure including the risks, benefits and alternatives for the proposed anesthesia with the patient or authorized representative who has indicated his/her understanding and acceptance.   Dental advisory given  Plan Discussed with: CRNA and Surgeon  Anesthesia Plan Comments:        Anesthesia Quick Evaluation

## 2013-05-01 NOTE — Transfer of Care (Signed)
Immediate Anesthesia Transfer of Care Note  Patient: [name]  Procedure(s) Performed: Procedure(s): BYPASS GRAFT FEMORAL-FEMORAL ARTERY/ LEFT - RIGHT (Bilateral)  Patient Location: PACU  Anesthesia Type:General  Level of Consciousness: awake, alert  and oriented  Airway & Oxygen Therapy: Patient Spontanous Breathing  Post-op Assessment: Report given to PACU RN  Post vital signs: Reviewed and stable  Complications: No apparent anesthesia complications

## 2013-05-01 NOTE — H&P (View-Only) (Signed)
Subjective: Interval History: none.. continues to report pain all over. Perhaps less so in his right calf today  Objective: Vital signs in last 24 hours: Temp:  [97.3 F (36.3 C)-98.7 F (37.1 C)] 97.3 F (36.3 C) (03/17 0436) Pulse Rate:  [56-83] 70 (03/17 0436) Resp:  [16-19] 18 (03/17 0436) BP: (107-150)/(38-57) 116/38 mmHg (03/17 0436) SpO2:  [92 %-100 %] 99 % (03/17 0820)  Intake/Output from previous day: 03/16 0701 - 03/17 0700 In: 342.5 [P.O.:330; Blood:12.5] Out: 475 [Urine:475] Intake/Output this shift: Total I/O In: -  Out: 400 [Urine:400]  Erythema continues to resolve and right calf. No change in his wound which are dressed.  Lab Results:  Recent Labs  04/28/13 1935 04/29/13 0540  HGB 8.5* 7.4*  HCT 24.5* 21.0*   BMET  Recent Labs  04/28/13 0623 04/29/13 0540  NA 138 138  K 3.8 4.4  CL 104 105  CO2 22 23  GLUCOSE 87 90  BUN 21 14  CREATININE 0.75 0.77  CALCIUM 7.7* 7.8*    Studies/Results: Dg Chest Port 1 View  04/24/2013   CLINICAL DATA Cough and congestion.  EXAM PORTABLE CHEST - 1 VIEW  COMPARISON DG CHEST 2 VIEW dated 01/29/2013; CT CHEST W/CM dated 02/02/2012; DG CHEST 2 VIEW dated 02/01/2012  FINDINGS Mediastinum and hilar structures are normal. Increased interstitial markings noted. Pneumonitis could present in this fashion. Underlying COPD most likely present. Changes of pleural parenchymal scarring noted. Heart size and pulmonary vascularity normal. No pneumothorax. No acute osseous abnormality. Degenerative changes both shoulders and thoracic spine.  IMPRESSION 1. Mild increased interstitial markings suggesting the possibility of interstitial pneumonitis.  2. COPD.  SIGNATURE  Electronically Signed   By: Marcello Moores  Register   On: 04/24/2013 15:14   Dg Swallowing Func-speech Pathology  04/25/2013   Houston Siren, CCC-SLP     04/25/2013 10:47 AM Objective Swallowing Evaluation: Modified Barium Swallowing Study   Patient Details  Name: [name] MRN: [id number] Date of Birth: Apr 25, 1934  Today's Date: 04/25/2013 Time: 7948-0165 SLP Time Calculation (min): 20 min  Past Medical History:  Past Medical History  Diagnosis Date  . Hyperlipidemia   . History of colon polyps   . Spinal stenosis   . Anxiety   . BPH (benign prostatic hypertrophy)   . ED (erectile dysfunction)   . Osteoporosis   . Hypertension   . Chest pain     "I've had it" (01/31/2012)   . Peripheral vascular disease     "right leg is 100% blocked; left leg has stent, still ~ 75%  blocked" (01/31/2012)  . Peripheral neuropathy     "bad" (01/31/2012)  . COPD (chronic obstructive pulmonary disease)   . Emphysema   . Pneumonia 1960's    "double" (01/31/2012)  . BRBPR (bright red blood per rectum)     "don't know what it's from; had some this week" (01/31/2012)  . Cervical spondylosis   . Rheumatoid arthritis(714.0)   . Arthritis     "hands and feet" (01/31/2012)  . DDD (degenerative disc disease), lumbosacral     "S1; L5" (01/31/2012)  . Chronic lower back pain   . Depression     "imagine I've got a mild depression w/what I've gone thru last  month" (01/31/2012)  . Prostate cancer 2004    "had 40 treatments of radiation" (01/31/2012)  . Breast cancer in male     "left" (01/31/2012)  . Basal cell carcinoma of nasal tip     "  .  Pernicious anemia   . GERD (gastroesophageal reflux disease)   . Polyneuropathy in other diseases classified elsewhere 11/06/2012   . Lumbosacral spondylosis without myelopathy 11/06/2012   Past Surgical History:  Past Surgical History  Procedure Laterality Date  . Basal cell carcinoma excision      Nose x 3  . Cholecystectomy  ?1990's  . Wrist fusion      Right Wrist; "3 OR's; it's fused" (01/31/2012)  . Excisional hemorrhoidectomy  1960's  . Knee surgery      "right; X 6 surgeries; went in for simple cartilage OR; ended  up w/fused knee" (01/31/2012)  . Iliac artery stent  12-07-09    Stent done by Dr. Gwenlyn Found  . Total hip arthroplasty  08/16/2011    Procedure: TOTAL HIP  ARTHROPLASTY ANTERIOR APPROACH;  Surgeon:  Mcarthur Rossetti, MD;  Location: [place];  Service:  Orthopedics;  Laterality: Right;  Right total hip replacement  . Cervical disc surgery      "7 total; ended w/a fusion" (01/31/2012)  . Esophagogastroduodenoscopy  02/22/2012    Procedure: ESOPHAGOGASTRODUODENOSCOPY (EGD);  Surgeon: Arta Silence, MD;  Location: [facility];  Service: Endoscopy;   Laterality: N/A;  . Eus  02/22/2012    Procedure: UPPER ENDOSCOPIC ULTRASOUND (EUS) RADIAL;  Surgeon:  Arta Silence, MD;  Location: [facility];  Service: Endoscopy;   Laterality: N/A;  . Hernia repair    . Transurethral resection of prostate     HPI:  79 y/o male with chief complaint of right leg redness, sores and  pain.Dx Cellulitis with draining L. Leg wound and sepsis .   History of ACDF, significant COPD/emphesema, cervical  spondylosis.  RN reports increased coughing with meds, swallow  eval ordered.  CXR  Mild increased interstitial markings  suggesting the possibility of interstitial pneumonitis.     Assessment / Plan / Recommendation Clinical Impression  Dysphagia Diagnosis: Mild oral phase dysphagia;Mild pharyngeal  phase dysphagia;Moderate pharyngeal phase dysphagia Clinical impression: Pt. exhibited mild oral impairment  characterized by prolonged mastication likely due to dentures not  present (lower plate broken).  Pharyngeal phase evidenced by  sensory deficits that is characteristic of pt.'s with COPD.   Decreased pharyngeal sensation led to late swallow initiating at  pyriform sinsues with flash laryngeal penetration of cup sip  thin.  Moderate amount of aspiration with straw sips thin given  increased velocity and sip size with reflexive cough.  SLP  recommending Dys 2 diet texture and thin liquids, NO straws,  pills whole in applesauce, small sips sitting upright and rest  breaks when needed.  ST will continue to follow.     Treatment Recommendation  Therapy as outlined in treatment plan below    Diet  Recommendation Dysphagia 2 (Fine chop);Thin liquid   Liquid Administration via: No straw;Cup Medication Administration: Whole meds with puree Supervision: Patient able to self feed Compensations: Slow rate;Small sips/bites (rest breaks) Postural Changes and/or Swallow Maneuvers: Seated upright 90  degrees    Other  Recommendations Oral Care Recommendations: Oral care BID   Follow Up Recommendations  None    Frequency and Duration min 2x/week  1 week   Pertinent Vitals/Pain WDL            Reason for Referral Objectively evaluate swallowing function   Oral Phase Oral Preparation/Oral Phase Oral Phase: Impaired Oral - Solids Oral - Regular: Delayed oral transit;Weak lingual manipulation   Pharyngeal Phase Pharyngeal Phase Pharyngeal Phase: Impaired Pharyngeal - Nectar Pharyngeal - Nectar Teaspoon:  Delayed swallow  initiation;Premature spillage to pyriform sinuses Pharyngeal - Nectar Cup: Delayed swallow initiation;Premature  spillage to pyriform sinuses Pharyngeal - Thin Pharyngeal - Thin Teaspoon: Delayed swallow initiation;Premature  spillage to pyriform sinuses Pharyngeal - Thin Cup: Delayed swallow initiation;Premature  spillage to pyriform sinuses;Penetration/Aspiration during  swallow Penetration/Aspiration details (thin cup): Material enters  airway, remains ABOVE vocal cords then ejected out Pharyngeal - Thin Straw: Penetration/Aspiration during  swallow;Delayed swallow initiation;Premature spillage to pyriform  sinuses;Moderate aspiration;Reduced airway/laryngeal closure Penetration/Aspiration details (thin straw): Material enters  airway, passes BELOW cords and not ejected out despite cough  attempt by patient Pharyngeal - Solids Pharyngeal - Regular: Within functional limits  Cervical Esophageal Phase    GO    Cervical Esophageal Phase Cervical Esophageal Phase: [facility]         Cranford Mon.Ed CCC-SLP Pager [phone]  04/25/2013   Anti-infectives: Anti-infectives   Start     Dose/Rate Route Frequency  Ordered Stop   04/27/13 0600  vancomycin (VANCOCIN) 1,250 mg in sodium chloride 0.9 % 250 mL IVPB     1,250 mg 166.7 mL/hr over 90 Minutes Intravenous Every 24 hours 04/26/13 2036     04/24/13 1430  ciprofloxacin (CIPRO) IVPB 400 mg     400 mg 200 mL/hr over 60 Minutes Intravenous Every 12 hours 04/24/13 1323     04/24/13 0800  vancomycin (VANCOCIN) IVPB 750 mg/150 ml premix  Status:  Discontinued     750 mg 150 mL/hr over 60 Minutes Intravenous Every 12 hours 04/23/13 2202 04/26/13 2036   04/21/13 1900  vancomycin (VANCOCIN) IVPB 750 mg/150 ml premix  Status:  Discontinued     750 mg 150 mL/hr over 60 Minutes Intravenous Every 24 hours 04/20/13 2034 04/23/13 2202   04/20/13 1800  vancomycin (VANCOCIN) IVPB 1000 mg/200 mL premix     1,000 mg 200 mL/hr over 60 Minutes Intravenous  Once 04/20/13 1748 04/20/13 1924      Assessment/Plan: s/p Procedure(s): ESOPHAGOGASTRODUODENOSCOPY (EGD) (Left) Plan for left to right fem-fem bypass tomorrow. Discussed of procedure with the patient.   LOS: 10 days   Arbutus Nelligan 04/30/2013, 8:39 AM

## 2013-05-01 NOTE — Anesthesia Postprocedure Evaluation (Signed)
  Anesthesia Post-op Note  Patient: [name]  Procedure(s) Performed: Procedure(s): BYPASS GRAFT FEMORAL-FEMORAL ARTERY/ LEFT - RIGHT (Bilateral)  Patient Location: PACU  Anesthesia Type:General  Level of Consciousness: awake, alert  and oriented  Airway and Oxygen Therapy: Patient Spontanous Breathing  Post-op Pain: mild  Post-op Assessment: Post-op Vital signs reviewed, Patient's Cardiovascular Status Stable, Respiratory Function Stable, Patent Airway, No signs of Nausea or vomiting and Pain level controlled  Post-op Vital Signs: Reviewed and stable  Complications: No apparent anesthesia complications

## 2013-05-01 NOTE — Interval H&P Note (Signed)
History and Physical Interval Note:  05/01/2013 8:22 AM  [name]  has presented today for surgery, with the diagnosis of Right Iliac Artery Occlusion  The various methods of treatment have been discussed with the patient and family. After consideration of risks, benefits and other options for treatment, the patient has consented to  Procedure(s): BYPASS GRAFT FEMORAL-FEMORAL ARTERY/ LEFT - RIGHT (Left) as a surgical intervention .  The patient's history has been reviewed, patient examined, no change in status, stable for surgery.  I have reviewed the patient's chart and labs.  Questions were answered to the patient's satisfaction.     [name]

## 2013-05-01 NOTE — Progress Notes (Signed)
PT Cancellation Note  Patient Details Name: [name] MRN: [id number] DOB: Aug 22, 1934   Cancelled Treatment:    Reason Eval/Treat Not Completed: Patient at procedure or test/unavailable. Pt in OR today. Will follow up at later date this week.   Willow Ora 05/01/2013, 8:43 AM  Willow Ora, PTA Office- [phone]

## 2013-05-01 NOTE — Op Note (Signed)
    OPERATIVE REPORT  DATE OF SURGERY: 05/01/2013  PATIENT: [name], 78 y.o. male MRN: [id number]  DOB: 1934-09-18  PRE-OPERATIVE DIAGNOSIS: Cellulitis right leg with open ulcers  POST-OPERATIVE DIAGNOSIS:  Same  PROCEDURE: Left to right femoral-femoral bypass with 8 mm Hemashield graft  SURGEON:  Curt Jews, M.D.  ASSISTANT: Nurse  ANESTHESIA:  Gen.  EBL: 50 ml  Total I/O In: 1050 [I.V.:800; IV Piggyback:250] Out: 945 [Urine:895; Blood:50]  BLOOD ADMINISTERED: None  DRAINS: None none  SPECIMEN: None  COUNTS CORRECT:  YES  PLAN OF CARE: PACU stable   PATIENT DISPOSITION:  PACU - hemodynamically stable  PROCEDURE DETAILS: The patient was taken to the operating placed supine position where both groins were prepped and draped in the usual sterile fashion. An oblique incision was made at the inguinal crease on the right and carried down to isolate the common femoral artery. This was at the level of the inguinal ligament. The external iliac artery was exposed above the inguinal ligament. Curvature branches were controlled with 2-0 silk Potts tie. Dissection skin down onto the more distal common femoral artery and this was of adequate size for bypass. The artery was encircled proximally and distally with vessel loops. Next attention was turned to the left groin. A similar incision was made obliquely at the level of the inguinal ligament. This was carried down to isolate the left common femoral artery. Again tributary branches were controlled with 2-0 silk Potts ties. The artery was controlled above the inguinal ligament with a blue vessel loop. A tunnel was created and a to C. configuration above the pubis. This was from the left the right groin incisions. An 8 mm Hemashield graft was brought through the tunnel. The patient was given 6000 units intravenous heparin. After adequate circulation time the common femoral artery was occluded proximal and distally was opened with an 11  blade and sent longitudinally with Potts scissors. The graft was spatulated and sewn end-to-side to the artery with a running 5-0 Prolene suture. Prior to completion of the anastomosis the usual flushing maneuvers were undertaken. The left groin incision was the first anastomosis. This gave excellent inflow. The graft was flushed with heparinized and reoccluded after removal of clamps from the left donor artery. Next the right common and femoral artery was occluded proximal and distally was opened with an 11 blade and extended longitudinally with Potts scissors. The graft was spatulated and sewn end-to-side to the artery with a running 5-0 Prolene suture. Prior to completion of the anastomosis the usual flushing maneuvers were undertaken. Anastomosis completed and flow is restored to the right leg. The patient was given 50 mg of protamine to reverse the heparin. Wound irrigated with saline. Hemostasis obtained with cautery. Wounds were closed with 2-0 Vicryl in the the subcutaneous and subcuticular tissue. Was closed with 3-0 Vicryl sutures. Insulin and Steri-Strips were applied. Patient was transferred to the recovery room in stable condition   Curt Jews, M.D. 05/01/2013 10:38 AM

## 2013-05-01 NOTE — Preoperative (Signed)
Beta Blockers   Reason not to administer Beta Blockers:Not Applicable 

## 2013-05-01 NOTE — OR Nursing (Signed)
Left radial arterial line with significant whip.  Square wave test and line manipulation performed.  Unable to decrease whip.  Art BP 192/52 and NIBP 127/53.  Will make clinical decisions from NIBP.

## 2013-05-01 NOTE — Progress Notes (Signed)
SLP Cancellation Note  Patient Details Name: [name] MRN: [id number] DOB: [birth year]   Cancelled treatment:        Unable to complete assessment for diet tolerance.  Pt currently off unit in surgery.  Will continue efforts.  Celia B. Quentin Ore [facility], CCC-SLP [phone]  Shonna Chock 05/01/2013, 11:29 AM

## 2013-05-02 LAB — BASIC METABOLIC PANEL
BUN: 7 mg/dL (ref 6–23)
CHLORIDE: 96 meq/L (ref 96–112)
CO2: 26 mEq/L (ref 19–32)
CREATININE: 0.77 mg/dL (ref 0.50–1.35)
Calcium: 8.2 mg/dL — ABNORMAL LOW (ref 8.4–10.5)
GFR calc Af Amer: >90 mL/min (ref >90)
GFR calc non Af Amer: 86 mL/min — ABNORMAL LOW (ref >90)
GLUCOSE: 105 mg/dL — AB (ref 70–99)
Potassium: 4.3 mEq/L (ref 3.7–5.3)
Sodium: 132 mEq/L — ABNORMAL LOW (ref 137–147)

## 2013-05-02 LAB — CBC
HEMATOCRIT: 31.1 % — AB (ref 39.0–52.0)
HEMOGLOBIN: 10.6 g/dL — AB (ref 13.0–17.0)
MCH: 32 pg (ref 26.0–34.0)
MCHC: 34.1 g/dL (ref 30.0–36.0)
MCV: 94 fL (ref 78.0–100.0)
Platelets: 155 10*3/uL (ref 150–400)
RBC: 3.31 MIL/uL — AB (ref 4.22–5.81)
RDW: 15.2 % (ref 11.5–15.5)
WBC: 9.8 10*3/uL (ref 4.0–10.5)

## 2013-05-02 MED ORDER — BOOST / RESOURCE BREEZE PO LIQD
1.0000 | Freq: Three times a day (TID) | ORAL | Status: DC
  Administered 2013-05-02 – 2013-05-07 (×11): 1 via ORAL

## 2013-05-02 MED ORDER — LACTATED RINGERS IV SOLN: INTRAVENOUS | Status: DC

## 2013-05-02 NOTE — Progress Notes (Signed)
Pt transferred to 2w rm#2 via bed. Vitals B/P 114/47,  Hr 70 sr, t 97.5, sat 97% on 2l.

## 2013-05-02 NOTE — Progress Notes (Signed)
NUTRITION FOLLOW UP   DOCUMENTATION CODES Per approved criteria  -Severe malnutrition in the context of chronic illness -Underweight   INTERVENTION: Resource Breeze po TID, each supplement provides 250 kcal and 9 grams of protein RD to follow for nutrition care plan  NUTRITION DIAGNOSIS: Malnutrition related to chronic illnes as evidenced by severe fat and muscle wasting and po intake </= 75% of his needs for >/= 1 month, ongoing  Goal: Pt to meet >/= 90% of their estimated nutrition needs, progressing  Monitor:  PO & supplemental intake, weight, labs, I/O's  ASSESSMENT: 79 y.o. male who presented to ED with RLE swelling and erythema x 1 week. Progressively worsening. There are ulcers present around his ankle. 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  Patient s/p EGD 3/14 -- impression -- bleeding highly likely from duodenal ulcers.  Patient s/p procedure 3/18: BYPASS GRAFT FEMORAL-FEMORAL ARTERY/ LEFT - RIGHT   Patient transferred from 6E-Medical Renal to 2S-Surgical post-op.  RN checking pt's pulses at bedside upon RD visit.  PO intake rather poor at 25% per flowsheet records.  Was receiving Ensure Complete while receiving solids (Dysphagia 2, thin liquids).  Will order [employer] (clear liquid supplement) to help meet kcal, protein needs for healing & recovery.  Height: Ht Readings from Last 1 Encounters:  04/27/13 5' 8.9" (1.75 m)    Weight: Wt Readings from Last 1 Encounters:  04/30/13 125 lb 7.1 oz (56.9 kg)    3/15  125 lb 3/14  126 lb 3/13  129 lb 3/07  124 lb  Estimated Nutritional Needs: Kcal: 1600-1800 Protein: 80-90 grams Fluid: > 1.6 L/day  Skin: R groin surgical incision  Diet Order: Clear Liquid   Intake/Output Summary (Last 24 hours) at 05/02/13 1032 Last data filed at 05/02/13 1000  Gross per 24 hour  Intake   1050 ml  Output    1235 ml  Net   -185 ml    Labs:   Recent Labs Lab 04/26/13 0603  04/30/13 0955 05/01/13 0536 05/02/13 0400  NA 141  < > 135* 136* 132*  K 2.7*  < > 4.3 3.7 4.3  CL 105  < > 98 100 96  CO2 25  < > 25 26 26   BUN 25*  < > 10 9 7   CREATININE 0.70  < > 0.76 0.87 0.77  CALCIUM 8.4  < > 8.5 8.3* 8.2*  MG 1.8  --   --   --   --   GLUCOSE 152*  < > 94 109* 105*  < > = values in this interval not displayed.   Scheduled Meds: . ciprofloxacin  400 mg Intravenous Q12H  . diazepam  2 mg Oral QHS  . enoxaparin (LOVENOX) injection  40 mg Subcutaneous Q24H  . gabapentin  300 mg Oral TID  . mometasone-formoterol  2 puff Inhalation BID  . pantoprazole  40 mg Oral BID AC  . prednisoLONE  5 mg Oral Q breakfast  . sertraline  50 mg Oral Daily  . tiotropium  18 mcg Inhalation Daily  . traZODone  50 mg Oral QHS  . vancomycin  1,250 mg Intravenous Q24H    Continuous Infusions: . lactated ringers 20 mL/hr at 05/02/13 0400    Past Medical History  Diagnosis Date  . Hyperlipidemia   . History of colon polyps   . Spinal stenosis   . Anxiety   .  BPH (benign prostatic hypertrophy)   . ED (erectile dysfunction)   . Osteoporosis   . Hypertension   . Chest pain     "I've had it" (01/31/2012)   . Peripheral vascular disease     "right leg is 100% blocked; left leg has stent, still ~ 75% blocked" (01/31/2012)  . Peripheral neuropathy     "bad" (01/31/2012)  . COPD (chronic obstructive pulmonary disease)   . Emphysema   . BRBPR (bright red blood per rectum)     "don't know what it's from; had some this week" (01/31/2012)  . Chronic lower back pain   . Pernicious anemia   . GERD (gastroesophageal reflux disease)   . Polyneuropathy in other diseases classified elsewhere 11/06/2012  . Prostate cancer 2004    "had 40 treatments of radiation" (01/31/2012)  . Breast cancer in male     "left" (01/31/2012)  . Basal cell carcinoma of nasal tip   . Pneumonia 1960's    "double"   . Cervical  spondylosis   . Rheumatoid arthritis(714.0)   . Arthritis     "all over my body" (04/25/2013)  . DDD (degenerative disc disease), lumbosacral     "S1; L5" (01/31/2012)  . Lumbosacral spondylosis without myelopathy 11/06/2012  . Cellulitis of right leg   . Depression     "won't take RX though" (04/25/2013)  . Falls frequently     Past Surgical History  Procedure Laterality Date  . Basal cell carcinoma excision      Nose x 3  . Cholecystectomy  ?1990's  . Wrist fusion Right     "3 OR's; it's fused" (01/31/2012)  . Excisional hemorrhoidectomy  1960's  . Knee surgery Right      6 surgeries; went in for simple cartilage OR; ended up w/fused knee" (01/31/2012)  . Iliac artery stent  12-07-09    Stent done by Dr. Gwenlyn Found  . Total hip arthroplasty  08/16/2011    Procedure: TOTAL HIP ARTHROPLASTY ANTERIOR APPROACH;  Surgeon: Mcarthur Rossetti, MD;  Location: [place];  Service: Orthopedics;  Laterality: Right;  Right total hip replacement  . Cervical disc surgery      "7 total; ended w/a fusion" (01/31/2012)  . Esophagogastroduodenoscopy  02/22/2012    Procedure: ESOPHAGOGASTRODUODENOSCOPY (EGD);  Surgeon: Arta Silence, MD;  Location: [facility];  Service: Endoscopy;  Laterality: N/A;  . Eus  02/22/2012    Procedure: UPPER ENDOSCOPIC ULTRASOUND (EUS) RADIAL;  Surgeon: Arta Silence, MD;  Location: [facility];  Service: Endoscopy;  Laterality: N/A;  . Hernia repair    . Transurethral resection of prostate    . Aortogram  04/25/2013  . Esophagogastroduodenoscopy Left 04/27/2013    Procedure: ESOPHAGOGASTRODUODENOSCOPY (EGD);  Surgeon: Arta Silence, MD;  Location: [facility];  Service: Endoscopy;  Laterality: Left;    Arthur Holms, RD, LDN Pager #: [phone] After-Hours Pager #: [phone]

## 2013-05-02 NOTE — Progress Notes (Signed)
Subjective: Interval History: none.. stable overnight. Continuing with complaints of pain in multiple sites.  Objective: Vital signs in last 24 hours: Temp:  [97.9 F (36.6 C)-98.5 F (36.9 C)] 98.3 F (36.8 C) (03/19 0747) Pulse Rate:  [62-118] 77 (03/19 0800) Resp:  [10-21] 14 (03/19 0800) BP: (88-149)/(39-77) 106/50 mmHg (03/19 0800) SpO2:  [90 %-100 %] 92 % (03/19 0800) Arterial Line BP: (121-195)/(38-62) 151/49 mmHg (03/19 0800)  Intake/Output from previous day: 03/18 0701 - 03/19 0700 In: 2040 [P.O.:180; I.V.:960; IV Piggyback:900] Out: 2020 [Urine:1970; Blood:50] Intake/Output this shift: Total I/O In: 20 [I.V.:20] Out: 60 [Urine:60]  Groin incisions with dressing intact and no hematoma. Easily palpable fem-fem graft pulse. Both feet well perfused. Biphasic dorsalis pedis signal on the right  Lab Results:  Recent Labs  05/01/13 0536 05/02/13 0400  WBC 8.4 9.8  HGB 10.3* 10.6*  HCT 29.8* 31.1*  PLT 182 155   BMET  Recent Labs  05/01/13 0536 05/02/13 0400  NA 136* 132*  K 3.7 4.3  CL 100 96  CO2 26 26  GLUCOSE 109* 105*  BUN 9 7  CREATININE 0.87 0.77  CALCIUM 8.3* 8.2*    Studies/Results: Dg Chest Port 1 View  04/24/2013   CLINICAL DATA Cough and congestion.  EXAM PORTABLE CHEST - 1 VIEW  COMPARISON DG CHEST 2 VIEW dated 01/29/2013; CT CHEST W/CM dated 02/02/2012; DG CHEST 2 VIEW dated 02/01/2012  FINDINGS Mediastinum and hilar structures are normal. Increased interstitial markings noted. Pneumonitis could present in this fashion. Underlying COPD most likely present. Changes of pleural parenchymal scarring noted. Heart size and pulmonary vascularity normal. No pneumothorax. No acute osseous abnormality. Degenerative changes both shoulders and thoracic spine.  IMPRESSION 1. Mild increased interstitial markings suggesting the possibility of interstitial pneumonitis.  2. COPD.  SIGNATURE  Electronically Signed   By: Marcello Moores  Register   On: 04/24/2013 15:14    Dg Swallowing Func-speech Pathology  04/25/2013   Houston Siren, CCC-SLP     04/25/2013 10:47 AM Objective Swallowing Evaluation: Modified Barium Swallowing Study   Patient Details  Name: [name] MRN: [id number] Date of Birth: 21-Feb-1934  Today's Date: 04/25/2013 Time: 4944-9675 SLP Time Calculation (min): 20 min  Past Medical History:  Past Medical History  Diagnosis Date  . Hyperlipidemia   . History of colon polyps   . Spinal stenosis   . Anxiety   . BPH (benign prostatic hypertrophy)   . ED (erectile dysfunction)   . Osteoporosis   . Hypertension   . Chest pain     "I've had it" (01/31/2012)   . Peripheral vascular disease     "right leg is 100% blocked; left leg has stent, still ~ 75%  blocked" (01/31/2012)  . Peripheral neuropathy     "bad" (01/31/2012)  . COPD (chronic obstructive pulmonary disease)   . Emphysema   . Pneumonia 1960's    "double" (01/31/2012)  . BRBPR (bright red blood per rectum)     "don't know what it's from; had some this week" (01/31/2012)  . Cervical spondylosis   . Rheumatoid arthritis(714.0)   . Arthritis     "hands and feet" (01/31/2012)  . DDD (degenerative disc disease), lumbosacral     "S1; L5" (01/31/2012)  . Chronic lower back pain   . Depression     "imagine I've got a mild depression w/what I've gone thru last  month" (01/31/2012)  . Prostate cancer 2004    "had 40 treatments of radiation" (01/31/2012)  .  Breast cancer in male     "left" (01/31/2012)  . Basal cell carcinoma of nasal tip     "  . Pernicious anemia   . GERD (gastroesophageal reflux disease)   . Polyneuropathy in other diseases classified elsewhere 11/06/2012   . Lumbosacral spondylosis without myelopathy 11/06/2012   Past Surgical History:  Past Surgical History  Procedure Laterality Date  . Basal cell carcinoma excision      Nose x 3  . Cholecystectomy  ?1990's  . Wrist fusion      Right Wrist; "3 OR's; it's fused" (01/31/2012)  . Excisional hemorrhoidectomy  1960's  . Knee surgery      "right; X 6  surgeries; went in for simple cartilage OR; ended  up w/fused knee" (01/31/2012)  . Iliac artery stent  12-07-09    Stent done by Dr. Gwenlyn Found  . Total hip arthroplasty  08/16/2011    Procedure: TOTAL HIP ARTHROPLASTY ANTERIOR APPROACH;  Surgeon:  Mcarthur Rossetti, MD;  Location: [place];  Service:  Orthopedics;  Laterality: Right;  Right total hip replacement  . Cervical disc surgery      "7 total; ended w/a fusion" (01/31/2012)  . Esophagogastroduodenoscopy  02/22/2012    Procedure: ESOPHAGOGASTRODUODENOSCOPY (EGD);  Surgeon: Arta Silence, MD;  Location: [facility];  Service: Endoscopy;   Laterality: N/A;  . Eus  02/22/2012    Procedure: UPPER ENDOSCOPIC ULTRASOUND (EUS) RADIAL;  Surgeon:  Arta Silence, MD;  Location: [facility];  Service: Endoscopy;   Laterality: N/A;  . Hernia repair    . Transurethral resection of prostate     HPI:  79 y/o male with chief complaint of right leg redness, sores and  pain.Dx Cellulitis with draining L. Leg wound and sepsis .   History of ACDF, significant COPD/emphesema, cervical  spondylosis.  RN reports increased coughing with meds, swallow  eval ordered.  CXR  Mild increased interstitial markings  suggesting the possibility of interstitial pneumonitis.     Assessment / Plan / Recommendation Clinical Impression  Dysphagia Diagnosis: Mild oral phase dysphagia;Mild pharyngeal  phase dysphagia;Moderate pharyngeal phase dysphagia Clinical impression: Pt. exhibited mild oral impairment  characterized by prolonged mastication likely due to dentures not  present (lower plate broken).  Pharyngeal phase evidenced by  sensory deficits that is characteristic of pt.'s with COPD.   Decreased pharyngeal sensation led to late swallow initiating at  pyriform sinsues with flash laryngeal penetration of cup sip  thin.  Moderate amount of aspiration with straw sips thin given  increased velocity and sip size with reflexive cough.  SLP  recommending Dys 2 diet texture and thin liquids, NO  straws,  pills whole in applesauce, small sips sitting upright and rest  breaks when needed.  ST will continue to follow.     Treatment Recommendation  Therapy as outlined in treatment plan below    Diet Recommendation Dysphagia 2 (Fine chop);Thin liquid   Liquid Administration via: No straw;Cup Medication Administration: Whole meds with puree Supervision: Patient able to self feed Compensations: Slow rate;Small sips/bites (rest breaks) Postural Changes and/or Swallow Maneuvers: Seated upright 90  degrees    Other  Recommendations Oral Care Recommendations: Oral care BID   Follow Up Recommendations  None    Frequency and Duration min 2x/week  1 week   Pertinent Vitals/Pain WDL            Reason for Referral Objectively evaluate swallowing function   Oral Phase Oral Preparation/Oral Phase Oral Phase: Impaired Oral -  Solids Oral - Regular: Delayed oral transit;Weak lingual manipulation   Pharyngeal Phase Pharyngeal Phase Pharyngeal Phase: Impaired Pharyngeal - Nectar Pharyngeal - Nectar Teaspoon: Delayed swallow  initiation;Premature spillage to pyriform sinuses Pharyngeal - Nectar Cup: Delayed swallow initiation;Premature  spillage to pyriform sinuses Pharyngeal - Thin Pharyngeal - Thin Teaspoon: Delayed swallow initiation;Premature  spillage to pyriform sinuses Pharyngeal - Thin Cup: Delayed swallow initiation;Premature  spillage to pyriform sinuses;Penetration/Aspiration during  swallow Penetration/Aspiration details (thin cup): Material enters  airway, remains ABOVE vocal cords then ejected out Pharyngeal - Thin Straw: Penetration/Aspiration during  swallow;Delayed swallow initiation;Premature spillage to pyriform  sinuses;Moderate aspiration;Reduced airway/laryngeal closure Penetration/Aspiration details (thin straw): Material enters  airway, passes BELOW cords and not ejected out despite cough  attempt by patient Pharyngeal - Solids Pharyngeal - Regular: Within functional limits  Cervical Esophageal Phase    GO     Cervical Esophageal Phase Cervical Esophageal Phase: [facility]         Cranford Mon.Ed CCC-SLP Pager [phone]  04/25/2013   Anti-infectives: Anti-infectives   Start     Dose/Rate Route Frequency Ordered Stop   04/27/13 0600  vancomycin (VANCOCIN) 1,250 mg in sodium chloride 0.9 % 250 mL IVPB     1,250 mg 166.7 mL/hr over 90 Minutes Intravenous Every 24 hours 04/26/13 2036     04/24/13 1430  ciprofloxacin (CIPRO) IVPB 400 mg     400 mg 200 mL/hr over 60 Minutes Intravenous Every 12 hours 04/24/13 1323     04/24/13 0800  vancomycin (VANCOCIN) IVPB 750 mg/150 ml premix  Status:  Discontinued     750 mg 150 mL/hr over 60 Minutes Intravenous Every 12 hours 04/23/13 2202 04/26/13 2036   04/21/13 1900  vancomycin (VANCOCIN) IVPB 750 mg/150 ml premix  Status:  Discontinued     750 mg 150 mL/hr over 60 Minutes Intravenous Every 24 hours 04/20/13 2034 04/23/13 2202   04/20/13 1800  vancomycin (VANCOCIN) IVPB 1000 mg/200 mL premix     1,000 mg 200 mL/hr over 60 Minutes Intravenous  Once 04/20/13 1748 04/20/13 1924      Assessment/Plan: s/p Procedure(s): BYPASS GRAFT FEMORAL-FEMORAL ARTERY/ LEFT - RIGHT (Bilateral) Stable postop day 1 from fem-fem bypass. We'll transfer to the floor. Stream we deconditioned. Okay to mobilize ad lib.   LOS: 12 days   EARLY, TODD 05/02/2013, 8:26 AM

## 2013-05-02 NOTE — Progress Notes (Signed)
Orthopedic Tech Progress Note Patient Details:  [name] 12/05/1934 [id number] Right Prafo boot applied Ortho Devices Type of Ortho Device: Other (comment) Ortho Device/Splint Location: Right PRAFO Ortho Device/Splint Interventions: Application   Asia R Thompson 05/02/2013, 1:14 PM

## 2013-05-02 NOTE — Progress Notes (Addendum)
CSW continuing to follow patient for dc to SNF once medically more stable.  CSW updated patient that [facility] made a bed offer. Patient appeared happy about this and stated he's been there twice before and liked it a lot. CSW placed FL2 on chart for MD signature.  Jeanette Caprice, MSW, [place]

## 2013-05-02 NOTE — Progress Notes (Signed)
Physical Therapy Treatment Patient Details Name: [name] MRN: [id number] DOB: 12-19-1934 Today's Date: 05/02/2013 Time: 6237-6283 PT Time Calculation (min): 14 min  PT Assessment / Plan / Recommendation  History of Present Illness [name] is a 79 y.o. male who presents to the ED with RLE swelling and erythema x 1 week.  Progressively worsening.  Worse with movement.  There are ulcers present around his ankle.  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  S/P fem-fem BPG   PT Comments   Pain limiting standing and transfers  Follow Up Recommendations  SNF;Supervision/Assistance - 24 hour     Does the patient have the potential to tolerate intense rehabilitation     Barriers to Discharge        Equipment Recommendations  None recommended by PT;Other (comment)    Recommendations for Other Services    Frequency Min 3X/week   Progress towards PT Goals Progress towards PT goals: Progressing toward goals  Plan Current plan remains appropriate    Precautions / Restrictions Precautions Precautions: Fall Precaution Comments: right knee fused   Pertinent Vitals/Pain     Mobility  Bed Mobility Overal bed mobility: Needs Assistance Bed Mobility: Sit to Supine Supine to sit: Max assist;HOB elevated Sit to supine: +2 for physical assistance;Mod assist General bed mobility comments: Significant assist due to pain and weakness Transfers Overall transfer level: Needs assistance Transfers: Stand Pivot Transfers;Sit to/from Stand Sit to Stand: Mod assist Squat pivot transfers: Max assist General transfer comment: cuing for hand placement, sequencing  and significant lift and steadying assist    Exercises     PT Diagnosis:    PT Problem List:   PT Treatment Interventions:     PT Goals (current goals can now be found in the care plan section) Acute Rehab PT Goals Patient Stated Goal: be able to walk again PT Goal Formulation:  With patient Time For Goal Achievement: 05/16/13 Potential to Achieve Goals: Fair  Visit Information  Last PT Received On: 05/02/13 Assistance Needed: +2 History of Present Illness: [name] is a 79 y.o. male who presents to the ED with RLE swelling and erythema x 1 week.  Progressively worsening.  Worse with movement.  There are ulcers present around his ankle.  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  S/P fem-fem BPG    Subjective Data  Subjective: No I can't put weight on it Patient Stated Goal: be able to walk again   Cognition  Cognition Arousal/Alertness: Awake/alert Behavior During Therapy: WFL for tasks assessed/performed Overall Cognitive Status: Within Functional Limits for tasks assessed    Balance  Balance Overall balance assessment: Needs assistance Sitting-balance support: Bilateral upper extremity supported Sitting balance-Leahy Scale: Poor Standing balance support: Bilateral upper extremity supported Standing balance-Leahy Scale: Poor General Comments General comments (skin integrity, edema, etc.): standing and transfers complicated by pain and fused R knee  End of Session PT - End of Session Equipment Utilized During Treatment: Oxygen Activity Tolerance: Patient limited by pain Patient left: in bed;with call bell/phone within reach;with family/visitor present Nurse Communication: Mobility status   GP     [name], Tessie Fass 05/02/2013, 1:35 PM 05/02/2013  Donnella Sham, [place] [phone]  (pager)

## 2013-05-02 NOTE — Progress Notes (Signed)
Patient's antibody test for Helicobacter pylori infection came back negative.  Meanwhile, the patient mentions problems with weight loss, and some degree of food intolerance to to abdominal pain. I am not at all convinced he has intestinal angina, and there is no bruit on abdominal examination, but he is certainly at risk for that condition based on his history of peripheral vascular disease.  Recommendation:  1. I would treat this patient with PPI therapy indefinitely. He is currently on twice-daily dosing which I think is reasonable, but at time of discharge, he could probably be decreased to once daily dosing.  2. As the patient recovers from his fem-fem bypass surgery, I would monitor his food intake and food associated symptoms. This is something that will probably become clearer over weeks, not days, so I will plan to sign off for the time being. However, if it is evident that he is having postprandial abdominal pain and food intolerance, I would either reconsult [place], or consider obtaining an imaging study of his intestinal vasculature (either a CT angiogram or a mesenteric arteriogram, at the discretion of the radiologist).  3. I have encouraged the patient to eat frequent small meals. I have also encouraged him to notify his physicians, here in the hospital, at the rehabilitation center post discharge, or as an outpatient, if he is having difficulty eating, food intolerance, postprandial abdominal pain, etc.  4. At this time, I do not think there is a definite need for followup endoscopy to confirm healing of his duodenal ulcers, but I would certainly consider that examination if the patient is not doing well from the standpoint of GI tract symptoms going forward.  5. Please call us if you have questions or we can be of further assistance in this patient's care.  Cleotis Nipper, M.D. [phone]

## 2013-05-02 NOTE — Progress Notes (Signed)
Occupational Therapy Treatment Patient Details Name: NEKODA CHOCK MRN: [id number] DOB: 01-Jul-1934 Today's Date: 05/02/2013 Time: 3500-9381 OT Time Calculation (min): 12 min  OT Assessment / Plan / Recommendation  History of present illness BOYD BUFFALO is a 79 y.o. male who presents to the ED with RLE swelling and erythema x 1 week.  Progressively worsening.  Worse with movement.  There are ulcers present around his ankle.  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  S/P fem-fem BPG   OT comments  Pt began theraband ex and given theratubing for utensils to increase independence with feeding. Pt very motivated to get stronger. Very appreciative of second visit so he could begin strengthening ex on his own and "have something to do over the weekend". Will continue to follow.   Follow Up Recommendations  SNF;Supervision/Assistance - 24 hour    Barriers to Discharge  Decreased caregiver support    Equipment Recommendations  None recommended by OT    Recommendations for Other Services    Frequency Min 2X/week   Progress towards OT Goals Progress towards OT goals: Progressing toward goals  Plan Discharge plan remains appropriate    Precautions / Restrictions Precautions Precautions: Fall Precaution Comments: right knee fused Required Braces or Orthoses: Other Brace/Splint Other Brace/Splint: r PRAFO   Pertinent Vitals/Pain no apparent distress     ADL  Eating/Feeding: Minimal assistance;Other (comment) (difficulty holding utensils and opening pkgs) Where Assessed - Eating/Feeding: Bed level Grooming: Minimal assistance Where Assessed - Grooming: Supported sitting Upper Body Bathing: Minimal assistance Where Assessed - Upper Body Bathing: Supported sitting Lower Body Bathing: Maximal assistance Where Assessed - Lower Body Bathing: Lean right and/or left Upper Body Dressing: Moderate assistance Where Assessed - Upper Body Dressing:  Supported sitting Lower Body Dressing: +1 Total assistance Where Assessed - Lower Body Dressing: Rolling right and/or left Transfers/Ambulation Related to ADLs: NA this pm due to fatigue, per PT +2 assist ADL Comments: given built up tubing to use with utensils    OT Diagnosis: Generalized weakness;Acute pain  OT Problem List: Decreased strength;Decreased range of motion;Decreased activity tolerance;Impaired balance (sitting and/or standing);Decreased coordination;Decreased safety awareness;Decreased knowledge of use of DME or AE;Decreased knowledge of precautions;Cardiopulmonary status limiting activity;Impaired sensation;Impaired UE functional use;Pain;Increased edema OT Treatment Interventions: Self-care/ADL training;Therapeutic exercise;Energy conservation;DME and/or AE instruction;Therapeutic activities;Patient/family education;Balance training   OT Goals(current goals can now be found in the care plan section) Acute Rehab OT Goals Patient Stated Goal: be able to walk again and care for myself OT Goal Formulation: With patient Time For Goal Achievement: 05/16/13 Potential to Achieve Goals: Good ADL Goals Pt Will Perform Eating: with modified independence;sitting;with adaptive utensils Pt Will Perform Grooming: with modified independence;sitting Pt Will Perform Upper Body Bathing: with set-up;sitting Pt/caregiver will Perform Home Exercise Program: Increased strength;Both right and left upper extremity;With theraputty;With theraband;Independently Additional ADL Goal #1: bed mobility for ADL with S for ADL  Visit Information  Last OT Received On: 05/02/13 Assistance Needed: +2 History of Present Illness: TREK KIMBALL is a 79 y.o. male who presents to the ED with RLE swelling and erythema x 1 week.  Progressively worsening.  Worse with movement.  There are ulcers present around his ankle.  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  S/P  fem-fem BPG    Subjective Data      Prior Functioning  Home Living Family/patient expects to be discharged to::  Skilled nursing facility Prior Function Level of Independence: Independent with assistive device(s) Comments: pt states he has been sleeping in lift chair, sponge bathing and having increasing difficulty caring for himself at home Dominant Hand: Right    Cognition  Cognition Arousal/Alertness: Awake/alert Behavior During Therapy: WFL for tasks assessed/performed Overall Cognitive Status: Within Functional Limits for tasks assessed    Mobility  Bed Mobility Overal bed mobility: Needs Assistance Bed Mobility: Sit to Supine Sit to supine: +2 for physical assistance;Mod assist General bed mobility comments: Significant assist due to pain and weakness Transfers Overall transfer level: Needs assistance    Exercises  Other Exercises Other Exercises: Pt taught level 2 therabanc ex for BUE to complete independently. Other Exercises: Pt given exercise ball   Balance Balance Overall balance assessment: Needs assistance Standing balance-Leahy Scale: Poor General Comments General comments (skin integrity, edema, etc.): R foot ulcers  End of Session OT - End of Session Activity Tolerance: Patient tolerated treatment well Patient left: in bed;with call bell/phone within reach;with bed alarm set Nurse Communication: Mobility status;Other (comment) (use of tubing for feeding)  GO     Estelene Carmack,[place] 05/02/2013, 6:10 PM [facility], OTR/L  [phone] 05/02/2013

## 2013-05-02 NOTE — Progress Notes (Signed)
[facility] PROGRESS NOTE  TRACKER MANCE PPJ:[id number] DOB: 01-Dec-1934 DOA: 04/20/2013 PCP: Simona Huh, MD  Assessment/Plan: GI bleed.  -Dr. Paulita Fujita: EGD 3/14 : Bleeding highly likely from duodenal ulcers; etiology unclear but ischemic ulcers (given his significant peripheral vascular disease) is a consideration -Continue empiric protonix currently at twice a day, but likely decrease to daily S/p 2 units transfused  Cellulitis with open ulcers:   L. Leg wound with calf ulceration and sepsis syndrome Slowly improving on vancomycin and Cipro.  Status post femoropopliteal repair  Pain -dilaudid PRN -added neurontin ? improvement  PAD: per vascular, s/p- Left to right femoral-femoral bypass with 8 mm Hemashield graft   Hypokalemia: resolved   Acute blood loss anemia  Diarrhea: c diff negative.   Cough: CXR shows nonspecific findings  Chronic dysphagia: Currently on clear liquids. Speech therapy to followup. Initial assessment was delayed due to the surgery  COPD (severe, per patient): no wheeze  Debility: SNF once medically stable  Protein calorie malnutrition, severe: Patient meets criteria in the context of chronic illness and underweight  Rheumatoid arthritis on chronic prednisone  Code Status: full Family Communication:  patient Disposition Plan: Transfer back to floor.   Consultants:  Vascular surgery- Dr Donnetta Hutching  GI  Procedures: 1.Arteriogram 2. Left to right femoral-femoral bypass with 8 mm Hemashield graft- 3/18   Antibiotics:  vanc started on 3/7   cipro 3/11  HPI/Subjective: Patient doing well. No complaints   Objective: Filed Vitals:   05/02/13 1700  BP: 103/64  Pulse: 67  Temp:   Resp: 11    Intake/Output Summary (Last 24 hours) at 05/02/13 1756 Last data filed at 05/02/13 1700  Gross per 24 hour  Intake   1190 ml  Output   1615 ml  Net   -425 ml   Filed Weights   04/27/13 2048 04/28/13 2036 04/30/13 2044  Weight:  56.972 kg (125 lb 9.6 oz) 57.3 kg (126 lb 5.2 oz) 56.9 kg (125 lb 7.1 oz)    Exam:  General: Alert and oriented, no acute distress Cardiovascular: Regular rate and rhythm, S1-S2 Respiratory: Decreased breath sounds bibasilar Abdomen: Soft, nontender, nondistended, positive bowel sounds Extremities: No edema. Feet perfusing.  Data Reviewed: Basic Metabolic Panel:  Recent Labs Lab 04/26/13 0603  04/29/13 0540 04/30/13 0722 04/30/13 0955 05/01/13 0536 05/02/13 0400  NA 141  < > 138 136* 135* 136* 132*  K 2.7*  < > 4.4 4.2 4.3 3.7 4.3  CL 105  < > 105 101 98 100 96  CO2 25  < > 23 25 25 26 26   GLUCOSE 152*  < > 90 95 94 109* 105*  BUN 25*  < > 14 10 10 9 7   CREATININE 0.70  < > 0.77 0.77 0.76 0.87 0.77  CALCIUM 8.4  < > 7.8* 8.1* 8.5 8.3* 8.2*  MG 1.8  --   --   --   --   --   --   < > = values in this interval not displayed. Liver Function Tests: No results found for this basename: AST, ALT, ALKPHOS, BILITOT, PROT, ALBUMIN,  in the last 168 hours No results found for this basename: LIPASE, AMYLASE,  in the last 168 hours No results found for this basename: AMMONIA,  in the last 168 hours CBC:  Recent Labs Lab 04/26/13 0603  04/28/13 1935 04/29/13 0540 04/30/13 0955 05/01/13 0536 05/02/13 0400  WBC 9.9  --   --   --  14.3* 8.4 9.8  HGB 11.9*  < > 8.5* 7.4* 10.8* 10.3* 10.6*  HCT 32.9*  < > 24.5* 21.0* 31.2* 29.8* 31.1*  MCV 89.9  --   --   --  92.9 92.8 94.0  PLT 196  --   --   --  214 182 155  < > = values in this interval not displayed. Cardiac Enzymes: No results found for this basename: CKTOTAL, CKMB, CKMBINDEX, TROPONINI,  in the last 168 hours BNP (last 3 results) No results found for this basename: PROBNP,  in the last 8760 hours CBG: No results found for this basename: GLUCAP,  in the last 168 hours  Recent Results (from the past 240 hour(s))  CLOSTRIDIUM DIFFICILE BY PCR     Status: None   Collection Time    04/25/13  1:59 PM      Result Value Ref  Range Status   C difficile by pcr NEGATIVE  NEGATIVE Final  SURGICAL PCR SCREEN     Status: None   Collection Time    05/01/13  1:52 AM      Result Value Ref Range Status   MRSA, PCR NEGATIVE  NEGATIVE Final   Staphylococcus aureus NEGATIVE  NEGATIVE Final   Comment:            The Xpert SA Assay (FDA     approved for NASAL specimens     in patients over 41 years of age),     is one component of     a comprehensive surveillance     program.  Test performance has     been validated by [employer] for patients greater     than or equal to 7 year old.     It is not intended     to diagnose infection nor to     guide or monitor treatment.     Studies: No results found.  Scheduled Meds: . ciprofloxacin  400 mg Intravenous Q12H  . diazepam  2 mg Oral QHS  . enoxaparin (LOVENOX) injection  40 mg Subcutaneous Q24H  . feeding supplement (RESOURCE BREEZE)  1 Container Oral TID BM  . gabapentin  300 mg Oral TID  . mometasone-formoterol  2 puff Inhalation BID  . pantoprazole  40 mg Oral BID AC  . prednisoLONE  5 mg Oral Q breakfast  . sertraline  50 mg Oral Daily  . tiotropium  18 mcg Inhalation Daily  . traZODone  50 mg Oral QHS  . vancomycin  1,250 mg Intravenous Q24H   Continuous Infusions: . lactated ringers 20 mL/hr at 05/02/13 0400   Time spent: 20 min  [facility] Pager [phone]. If 7PM-7AM, please contact night-coverage at [email or web address], password [facility] 05/02/2013, 5:56 PM  LOS: 12 days

## 2013-05-02 NOTE — Progress Notes (Signed)
Physical Therapy Treatment Patient Details Name: [name] MRN: [id number] DOB: September 09, 1934 Today's Date: 05/02/2013 Time: 6301-6010 PT Time Calculation (min): 23 min  PT Assessment / Plan / Recommendation  History of Present Illness [name] is a 79 y.o. male who presents to the ED with RLE swelling and erythema x 1 week.  Progressively worsening.  Worse with movement.  There are ulcers present around his ankle.  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  S/P fem-fem BPG   PT Comments   Post surgery for fem-fem BPG, pt is significantly painful on the R with weak df on R.  Need of significant assist for bed mobility and transfers.  Pt unable to bear weight on right LE.   Follow Up Recommendations  SNF;Supervision/Assistance - 24 hour     Does the patient have the potential to tolerate intense rehabilitation     Barriers to Discharge        Equipment Recommendations  None recommended by PT;Other (comment) (TBA in SNF)    Recommendations for Other Services    Frequency Min 3X/week   Progress towards PT Goals Progress towards PT goals:  (continue with same goals post surgery)  Plan Current plan remains appropriate    Precautions / Restrictions Precautions Precautions: Fall Precaution Comments: right knee fused   Pertinent Vitals/Pain Sat through out on 2L [place] at 92%   HR 80/90's    Mobility  Bed Mobility Overal bed mobility: Needs Assistance Bed Mobility: Supine to Sit Supine to sit: Max assist;HOB elevated General bed mobility comments: Up via L elbow, but with significant pain and need for significant truncal and LE assist. Transfers Overall transfer level: Needs assistance Transfers: Sit to/from [employer] Transfers Sit to Stand: From elevated surface;Mod assist Squat pivot transfers: Max assist (+2 needed for assist/lines.but not available) General transfer comment: pt mod assist with weight on LLE to stand  with assist of Rail and stabilized RW. Pt unable to bear weight on RLE, extend trunk in standing or pivot to chair.    Exercises     PT Diagnosis:    PT Problem List:   PT Treatment Interventions:     PT Goals (current goals can now be found in the care plan section) Acute Rehab PT Goals PT Goal Formulation: With patient Time For Goal Achievement: 05/16/13 Potential to Achieve Goals: Fair  Visit Information  Last PT Received On: 05/02/13 Assistance Needed: +2 History of Present Illness: [name] is a 79 y.o. male who presents to the ED with RLE swelling and erythema x 1 week.  Progressively worsening.  Worse with movement.  There are ulcers present around his ankle.  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  S/P fem-fem BPG    Subjective Data  Subjective: I don't think I can stay up...hurts so bad.   Cognition  Cognition Arousal/Alertness: Awake/alert Behavior During Therapy: WFL for tasks assessed/performed Overall Cognitive Status: Within Functional Limits for tasks assessed    Balance  Balance Overall balance assessment: Needs assistance Sitting-balance support: Bilateral upper extremity supported Sitting balance-Leahy Scale: Poor Standing balance-Leahy Scale: Poor General Comments General comments (skin integrity, edema, etc.): standing and transfers complicated by pain and fused R knee  End of Session PT - End of Session Equipment Utilized During Treatment: Oxygen Activity Tolerance: Patient limited by pain Patient left: in chair;with call bell/phone within reach Nurse Communication:  Mobility status (lift would be helpful)   GP     Zenia Guest, Tessie Fass 05/02/2013, 10:46 AM 05/02/2013  Donnella Sham, PT [phone]  (pager)

## 2013-05-02 NOTE — Progress Notes (Signed)
Occupational Therapy Evaluation Patient Details Name: [name] MRN: [id number] DOB: [birth year] Today's Date: 05/02/2013 Time: 5284-1324 OT Time Calculation (min): 22 min  OT Assessment / Plan / Recommendation History of present illness [name] is a 79 y.o. male who presents to the ED with RLE swelling and erythema x 1 week.  Progressively worsening.  Worse with movement.  There are ulcers present around his ankle.  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  S/P fem-fem BPG   Clinical Impression   PTA, pt lived alone and was having more difficulty with self care and mobility. Eval somewhat limited due to fatigue. Pt presents with significant decline in self care and mobility due to deficits below. Pt will benefit from skilled OT services to facilitate D/C to SNF with goal of returning home. Feel pt is excellent candidate for ALF.     OT Assessment  Patient needs continued OT Services    Follow Up Recommendations  SNF;Supervision/Assistance - 24 hour    Barriers to Discharge Decreased caregiver support    Equipment Recommendations  None recommended by OT    Recommendations for Other Services    Frequency  Min 2X/week    Precautions / Restrictions Precautions Precautions: Fall Required Braces or Orthoses: Other Brace/Splint (r PRAFO splint)   Pertinent Vitals/Pain C/o pain RLE    ADL  Eating/Feeding: Minimal assistance;Other (comment) (difficulty holding utensils and opening pkgs) Where Assessed - Eating/Feeding: Bed level Grooming: Minimal assistance Where Assessed - Grooming: Supported sitting Upper Body Bathing: Minimal assistance Where Assessed - Upper Body Bathing: Supported sitting Lower Body Bathing: Maximal assistance Where Assessed - Lower Body Bathing: Lean right and/or left Upper Body Dressing: Moderate assistance Where Assessed - Upper Body Dressing: Supported sitting Lower Body Dressing: +1 Total  assistance Where Assessed - Lower Body Dressing: Rolling right and/or left Transfers/Ambulation Related to ADLs: NA this pm due to fatigue, per PT +2 assist ADL Comments: continued increaed difficulty with ADL. WEak hands B. Will benefit from tubing    OT Diagnosis: Generalized weakness;Acute pain  OT Problem List: Decreased strength;Decreased range of motion;Decreased activity tolerance;Impaired balance (sitting and/or standing);Decreased coordination;Decreased safety awareness;Decreased knowledge of use of DME or AE;Decreased knowledge of precautions;Cardiopulmonary status limiting activity;Impaired sensation;Impaired UE functional use;Pain;Increased edema OT Treatment Interventions: Self-care/ADL training;Therapeutic exercise;Energy conservation;DME and/or AE instruction;Therapeutic activities;Patient/family education;Balance training   OT Goals(Current goals can be found in the care plan section) Acute Rehab OT Goals Patient Stated Goal: be able to walk again and care for myself OT Goal Formulation: With patient Time For Goal Achievement: 05/16/13 Potential to Achieve Goals: Good  Visit Information  Last OT Received On: 05/02/13 Assistance Needed: +2 History of Present Illness: [name] is a 79 y.o. male who presents to the ED with RLE swelling and erythema x 1 week.  Progressively worsening.  Worse with movement.  There are ulcers present around his ankle.  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  S/P fem-fem BPG       Prior Functioning     Home Living Family/patient expects to be discharged to:: Skilled nursing facility Prior Function Level of Independence: Independent with assistive device(s) Comments: pt states he has been sleeping in lift chair, sponge bathing and having increasing difficulty caring for himself at home Dominant Hand: Right         Vision/Perception     Cognition  Cognition Arousal/Alertness:  Awake/alert Behavior During Therapy: WFL for tasks assessed/performed Overall Cognitive Status: Within Functional Limits for tasks assessed    Extremity/Trunk Assessment Upper Extremity Assessment Upper Extremity Assessment: Generalized weakness Lower Extremity Assessment Lower Extremity Assessment: RLE deficits/detail;LLE deficits/detail RLE Deficits / Details: right knee fused with hip strength grossly 2-/5 ROM limited by fusion and pain Cervical / Trunk Assessment Cervical / Trunk Assessment: Normal     Mobility Bed Mobility Overal bed mobility: Needs Assistance Bed Mobility: Sit to Supine Sit to supine: +2 for physical assistance;Mod assist General bed mobility comments: Significant assist due to pain and weakness Transfers Overall transfer level: Needs assistance     Exercise     Balance Balance Overall balance assessment: Needs assistance Standing balance-Leahy Scale: Poor General Comments General comments (skin integrity, edema, etc.): R foot ulcers   End of Session OT - End of Session Activity Tolerance: Patient limited by fatigue Patient left: in bed;with call bell/phone within reach Nurse Communication: Mobility status;Other (comment) (proper positionoing of R PRAFO on blanket roll )  GO     Ramla Hase,HILLARY 05/02/2013, 5:38 PM [facility], OTR/L  [phone] 05/02/2013

## 2013-05-03 ENCOUNTER — Encounter (HOSPITAL_COMMUNITY): Payer: Self-pay | Admitting: Vascular Surgery

## 2013-05-03 DIAGNOSIS — R64 Cachexia: Secondary | ICD-10-CM

## 2013-05-03 DIAGNOSIS — Z48812 Encounter for surgical aftercare following surgery on the circulatory system: Secondary | ICD-10-CM

## 2013-05-03 LAB — VANCOMYCIN, TROUGH: VANCOMYCIN TR: 14 ug/mL (ref 10.0–20.0)

## 2013-05-03 MED ORDER — HYDROMORPHONE HCL PF 1 MG/ML IJ SOLN
1.0000 mg | INTRAMUSCULAR | Status: DC | PRN
  Administered 2013-05-03 – 2013-05-04 (×3): 2 mg via INTRAVENOUS
  Administered 2013-05-04: 1 mg via INTRAVENOUS
  Administered 2013-05-04: 2 mg via INTRAVENOUS
  Administered 2013-05-04: 1 mg via INTRAVENOUS
  Administered 2013-05-04 – 2013-05-05 (×3): 2 mg via INTRAVENOUS
  Administered 2013-05-05: 1 mg via INTRAVENOUS
  Administered 2013-05-05 – 2013-05-06 (×5): 2 mg via INTRAVENOUS
  Administered 2013-05-06: 1 mg via INTRAVENOUS
  Filled 2013-05-03 (×2): qty 2
  Filled 2013-05-03: qty 1
  Filled 2013-05-03 (×4): qty 2
  Filled 2013-05-03: qty 1
  Filled 2013-05-03 (×3): qty 2
  Filled 2013-05-03: qty 1
  Filled 2013-05-03 (×3): qty 2

## 2013-05-03 NOTE — Progress Notes (Signed)
Physical Therapy Treatment Patient Details Name: [name] MRN: [id number] DOB: [birth year] Today's Date: 05/03/2013 Time: 1607-3710 PT Time Calculation (min): 25 min  PT Assessment / Plan / Recommendation  History of Present Illness [name] is a 79 y.o. male who presents to the ED with RLE swelling and erythema x 1 week.  Progressively worsening.  Worse with movement.  There are ulcers present around his ankle.  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  S/P fem-fem BPG   PT Comments   Pt in excruciating pain during mobility, esp weight bearing activity.  As treatment progress, was able to get patients foot more snuggly into the [facility] where it functioned better, combated contracture, but increase pain a bit.  Follow Up Recommendations  SNF;Supervision/Assistance - 24 hour     Does the patient have the potential to tolerate intense rehabilitation     Barriers to Discharge        Equipment Recommendations  None recommended by PT;Other (comment)    Recommendations for Other Services    Frequency Min 3X/week   Progress towards PT Goals Progress towards PT goals: Progressing toward goals (though very slowly)  Plan Current plan remains appropriate    Precautions / Restrictions Precautions Precautions: Fall Precaution Comments: right knee fused Required Braces or Orthoses: Other Brace/Splint Other Brace/Splint:  PRAFO on R   Pertinent Vitals/Pain "12"/10 at R LE.     Mobility  Bed Mobility Overal bed mobility: Needs Assistance Bed Mobility: Sit to Supine;Supine to Sit Supine to sit: Mod assist;HOB elevated Sit to supine: Mod assist General bed mobility comments: Significant assist due to pain and  UE weakness Transfers Overall transfer level: Needs assistance Equipment used: Rolling walker (2 wheeled) Transfers: Sit to/from Stand Sit to Stand: Mod assist General transfer comment: cuing for hand placement, sequencing   and significant lift and steadying assist Ambulation/Gait Ambulation/Gait assistance: Mod assist;Max assist Ambulation Distance (Feet): 10 Feet (times 2) Gait Pattern/deviations: Step-to pattern;Antalgic;Decreased stance time - right;Decreased step length - right;Decreased step length - left Gait velocity interpretation: Below normal speed for age/gender General Gait Details: R LE consistently out in front, very little weight bearing on the R LE    Exercises     PT Diagnosis:    PT Problem List:   PT Treatment Interventions:     PT Goals (current goals can now be found in the care plan section) Acute Rehab PT Goals Patient Stated Goal: be able to walk again and care for myself PT Goal Formulation: With patient Time For Goal Achievement: 05/16/13 Potential to Achieve Goals: Fair  Visit Information  Last PT Received On: 05/03/13 Assistance Needed: +2 History of Present Illness: [name] is a 79 y.o. male who presents to the ED with RLE swelling and erythema x 1 week.  Progressively worsening.  Worse with movement.  There are ulcers present around his ankle.  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  S/P fem-fem BPG    Subjective Data  Subjective: Oh God.Marland KitchenMarland KitchenI've never had anything hurt so bad. Patient Stated Goal: be able to walk again and care for myself   Cognition  Cognition Arousal/Alertness: Awake/alert Behavior During Therapy: [facility] for tasks assessed/performed Overall Cognitive Status: Within Functional Limits for tasks assessed    Balance  Balance Overall balance assessment: Needs assistance Sitting-balance support: Single extremity supported;No upper extremity supported Sitting balance-Leahy Scale: Poor Standing  balance support: Bilateral upper extremity supported;Single extremity supported Standing balance-Leahy Scale: Poor Standing balance comment: posterior lean, poor w/shift onto L LE  End of Session PT - End of  Session Activity Tolerance: Patient limited by pain Patient left: in bed;with call bell/phone within reach;with family/visitor present Nurse Communication: Mobility status   GP     Arafat Cocuzza, Tessie Fass 05/03/2013, 3:18 PM

## 2013-05-03 NOTE — Progress Notes (Signed)
ANTIBIOTIC CONSULT NOTE - FOLLOW UP  Pharmacy Consult for Vancomycin Indication: cellulitis  Allergies  Allergen Reactions  . Lipitor [Atorvastatin Calcium] Other (See Comments)    Myalgias "don't remember how bad" (01/31/2012)  . Penicillins Other (See Comments)    Passed out    Patient Measurements: Height: 5' 8.9" (175 cm) Weight: 125 lb 7.1 oz (56.9 kg) IBW/kg (Calculated) : 70.47 Adjusted Body Weight:   Vital Signs: Temp: 97.8 F (36.6 C) (03/20 0445) Temp src: Oral (03/20 0445) BP: 108/62 mmHg (03/20 0445) Pulse Rate: 59 (03/20 0740) Intake/Output from previous day: 03/19 0701 - 03/20 0700 In: 840 [P.O.:400; I.V.:240; IV Piggyback:200] Out: 660 [Urine:660] Intake/Output from this shift: Total I/O In: 240 [P.O.:240] Out: -   Labs:  Recent Labs  05/01/13 0536 05/02/13 0400  WBC 8.4 9.8  HGB 10.3* 10.6*  PLT 182 155  CREATININE 0.87 0.77   Estimated Creatinine Clearance: 63.2 ml/min (by C-G formula based on Cr of 0.77).  Recent Labs  05/03/13 0550  VANCOTROUGH 14.0     Microbiology: Recent Results (from the past 720 hour(s))  CULTURE, BLOOD (ROUTINE X 2)     Status: None   Collection Time    04/20/13  9:15 PM      Result Value Ref Range Status   Specimen Description BLOOD RIGHT HAND   Final   Special Requests BOTTLES DRAWN AEROBIC ONLY 4CC   Final   Culture  Setup Time     Final   Value: 04/21/2013 13:08     Performed at [facility]   Culture     Final   Value: NO GROWTH 5 DAYS     Performed at [facility]   Report Status 04/27/2013 FINAL   Final  CULTURE, BLOOD (ROUTINE X 2)     Status: None   Collection Time    04/20/13  9:26 PM      Result Value Ref Range Status   Specimen Description BLOOD LEFT HAND   Final   Special Requests BOTTLES DRAWN AEROBIC ONLY 3CC   Final   Culture  Setup Time     Final   Value: 04/21/2013 13:08     Performed at [facility]   Culture     Final   Value: NO GROWTH 5 DAYS      Performed at [facility]   Report Status 04/27/2013 FINAL   Final  CLOSTRIDIUM DIFFICILE BY PCR     Status: None   Collection Time    04/25/13  1:59 PM      Result Value Ref Range Status   C difficile by pcr NEGATIVE  NEGATIVE Final  SURGICAL PCR SCREEN     Status: None   Collection Time    05/01/13  1:52 AM      Result Value Ref Range Status   MRSA, PCR NEGATIVE  NEGATIVE Final   Staphylococcus aureus NEGATIVE  NEGATIVE Final   Comment:            The Xpert SA Assay (FDA     approved for NASAL specimens     in patients over 65 years of age),     is one component of     a comprehensive surveillance     program.  Test performance has     been validated by [employer] for patients greater     than or equal to 77 year old.     It is not  intended     to diagnose infection nor to     guide or monitor treatment.    Anti-infectives   Start     Dose/Rate Route Frequency Ordered Stop   04/27/13 0600  vancomycin (VANCOCIN) 1,250 mg in sodium chloride 0.9 % 250 mL IVPB     1,250 mg 166.7 mL/hr over 90 Minutes Intravenous Every 24 hours 04/26/13 2036     04/24/13 1430  ciprofloxacin (CIPRO) IVPB 400 mg     400 mg 200 mL/hr over 60 Minutes Intravenous Every 12 hours 04/24/13 1323     04/24/13 0800  vancomycin (VANCOCIN) IVPB 750 mg/150 ml premix  Status:  Discontinued     750 mg 150 mL/hr over 60 Minutes Intravenous Every 12 hours 04/23/13 2202 04/26/13 2036   04/21/13 1900  vancomycin (VANCOCIN) IVPB 750 mg/150 ml premix  Status:  Discontinued     750 mg 150 mL/hr over 60 Minutes Intravenous Every 24 hours 04/20/13 2034 04/23/13 2202   04/20/13 1800  vancomycin (VANCOCIN) IVPB 1000 mg/200 mL premix     1,000 mg 200 mL/hr over 60 Minutes Intravenous  Once 04/20/13 1748 04/20/13 1924      Assessment: 79yo male with cellulitis, continuing to resolve.  Vanc Trough this AM within goal range at 14.  Cr < 1.  Blood cultures are negative and final.  Goal of Therapy:   Vancomycin trough level 10-15 mcg/ml  Plan:  1-  Continue Vancomycin 1250mg  IV q24 2-  Continue to watch renal fxn   Gracy Bruins, PharmD Clinical Pharmacist [facility]

## 2013-05-03 NOTE — Progress Notes (Signed)
Patient ID: [name], male   DOB: 02/13/1935, 79 y.o.   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 In addition patient reported intermittent episodes of dark stools. He had endoscopic ultrasound done 1 year ago which was unrevealing, colonoscopy by Charles. Oletta [name] at June 2012 which showed small polyps and no other acute findings.   Assessment/Plan:  Acute on chronic blood loss anemia - Endoscopy done 04/27/2013 -> duodenal ulcers, unclear etiology, ? ischemic ulcers given significant peripheral vascular disease - No signs of bleeding per patient, continue protonic twice a day - Hemoglobin remained stable ~ 10 (has required 2 units of transfusion while inpatient 03/16)  PAD - per vascular, s/p- Left to right femoral-femoral bypass with 8 mm Hemashield graft - Postop day #2, patient still in pain, reports medicine is not lasting long - Continue twice daily normal saline dressing changes  Cellulitis with open ulcers - Left leg with calf ulceration - Slowly improving on vancomycin and ciprofloxacin Hypokalemia - resolved  Chronic dysphagia - Currently on clear liquids - SLP evaluation with plan on advancing diet as pt wants to eat COPD  - clinically compensated, patient maintaining oxygen saturations at target range Functional quadriplegia  - Secondary to acute on chronic illnesses outlined above  - Will need SNF once medically stable  Protein calorie malnutrition, severe - Patient meets criteria in the context of chronic illness and underweight   Rheumatoid arthritis  - on chronic prednisone   Code Status: full  Family Communication: patient  Disposition Plan:  Will go to SNF once medically ready   Consultants:  Vascular surgery- [name]  GI Procedures:   1.Arteriogram   2. Left to right femoral-femoral bypass with 8 mm Hemashield graft- 3/18  Antibiotics:  vanc started on 3/7  cipro 3/11  HPI/Subjective: No events overnight.   Objective: Filed Vitals:   05/02/13 2037 05/02/13 2210 05/03/13 0445 05/03/13 0740  BP:  114/47 108/62   Pulse:  73 62 59  Temp:  97.5 F (36.4 C) 97.8 F (36.6 C)   TempSrc:  Oral Oral   Resp:   18 18  Height:      Weight:      SpO2: 95% 97% 94% 96%    Intake/Output Summary (Last 24 hours) at 05/03/13 0836 Last data filed at 05/02/13 1900  Gross per 24 hour  Intake    820 ml  Output    600 ml  Net    220 ml    Exam:   General:  Pt is alert, follows commands appropriately, not in acute distress  Cardiovascular: Regular rate and rhythm, S1/S2, no murmurs, no rubs, no gallops  Respiratory: Clear to auscultation bilaterally, no wheezing, no crackles, no rhonchi  Abdomen: Soft, non tender, non distended, bowel sounds present, no guarding  Data Reviewed: Basic Metabolic Panel:  Recent Labs Lab 04/29/13 0540 04/30/13 0722 04/30/13 0955 05/01/13 0536 05/02/13 0400  NA 138 136* 135* 136* 132*  K 4.4 4.2 4.3 3.7 4.3  CL 105 101 98 100 96  CO2 23 25 25 26 26   GLUCOSE 90 95 94 109* 105*  BUN 14 10 10 9 7   CREATININE 0.77 0.77 0.76 0.87 0.77  CALCIUM 7.8* 8.1* 8.5 8.3* 8.2*   CBC:  Recent Labs Lab 04/28/13 1935 04/29/13 0540 04/30/13 0955 05/01/13 0536 05/02/13 0400  WBC  --   --  14.3* 8.4 9.8  HGB 8.5* 7.4* 10.8* 10.3* 10.6*  HCT 24.5* 21.0* 31.2* 29.8* 31.1*  MCV  --   --  92.9 92.8 94.0  PLT  --   --  214 182 155     Recent Results (from the past 240 hour(s))  CLOSTRIDIUM DIFFICILE BY PCR     Status: None   Collection Time    04/25/13  1:59 PM       Result Value Ref Range Status   C difficile by pcr NEGATIVE  NEGATIVE Final  SURGICAL PCR SCREEN     Status: None   Collection Time    05/01/13  1:52 AM      Result Value Ref Range Status   MRSA, PCR NEGATIVE  NEGATIVE Final   Staphylococcus aureus NEGATIVE  NEGATIVE Final   Comment:            The Xpert SA Assay (FDA     approved for NASAL specimens     in patients over 41 years of age),     is one component of     a comprehensive surveillance     program.  Test performance has     been validated by [employer] for patients greater     than or equal to 82 year old.     It is not intended     to diagnose infection nor to     guide or monitor treatment.     Scheduled Meds: . ciprofloxacin  400 mg Intravenous Q12H  . diazepam  2 mg Oral QHS  . enoxaparin (LOVENOX) injection  40 mg Subcutaneous Q24H  . feeding supplement (RESOURCE BREEZE)  1 Container Oral TID BM  . gabapentin  300 mg Oral TID  . mometasone-formoterol  2 puff Inhalation BID  . pantoprazole  40 mg Oral BID AC  . prednisoLONE  5 mg Oral Q breakfast  . sertraline  50 mg Oral Daily  . tiotropium  18 mcg Inhalation Daily  . traZODone  50 mg Oral QHS  . vancomycin  1,250 mg Intravenous Q24H   Continuous Infusions: . lactated ringers 20 mL/hr at 05/02/13 Charles Riding, MD  [facility] Pager [phone]  If 7PM-7AM, please contact night-coverage [email or web address] Password [facility] 05/03/2013, 8:36 AM   LOS: 13 days

## 2013-05-03 NOTE — Progress Notes (Signed)
VASCULAR LAB PRELIMINARY  ARTERIAL  ABI completed:    RIGHT    LEFT    PRESSURE WAVEFORM  PRESSURE WAVEFORM  BRACHIAL 105 Tri BRACHIAL 95 Tri  DP   DP    AT 58 Damp mono AT 72 Damp mono  PT 51 Damp mono PT 60 Damp mono  PER   PER    GREAT TOE  NA GREAT TOE  NA    RIGHT LEFT  ABI 0.55 0.69    Landry Mellow, RDMS, RVT  05/03/2013, 10:24 AM

## 2013-05-03 NOTE — Progress Notes (Signed)
Speech Language Pathology Treatment: Dysphagia  Patient Details Name: [name] MRN: [id number] DOB: [birth year] Today's Date: 05/03/2013 Time: 1500-1520 SLP Time Calculation (min): 20 min  Assessment / Plan / Recommendation Clinical Impression  SLP reinforced precaution of no straws, which pt has not been following. Observed with thin liquids, able to toelrte. Pts dentures were covered in dry paper and could not be placed without soaking for a day, so pt attempted to masticate cracker without dentures  - unable. Will place pt on a dys 2 (ground) diet with thin liquids. No further f/u needed as pt reports he was eating this soft diet at baseline. Posted signage regarding no straws.    HPI HPI: 79 y/o male with chief complaint of right leg redness, sores and pain.Dx Cellulitis with draining L. Leg wound and sepsis .  History of ACDF, significant COPD/emphesema, cervical spondylosis.  RN reports increased coughing with meds, swallow eval ordered.  CXR  Mild increased interstitial markings suggesting the possibility of interstitial pneumonitis.   Pertinent Vitals NA  SLP Plan  Continue with current plan of care    Recommendations Diet recommendations: Dysphagia 2 (fine chop);Thin liquid Liquids provided via: Cup;No straw Medication Administration: Whole meds with puree Supervision: Patient able to self feed Compensations: Slow rate;Small sips/bites Postural Changes and/or Swallow Maneuvers: Seated upright 90 degrees              Oral Care Recommendations: Oral care BID Follow up Recommendations: None Plan: Continue with current plan of care    GO    [facility], MA CCC-SLP [phone]  Lynann Beaver 05/03/2013, 4:20 PM

## 2013-05-03 NOTE — Progress Notes (Signed)
Subjective: Interval History: none.. reports severe pain in the right calf. No further GI bleeding  Objective: Vital signs in last 24 hours: Temp:  [97.5 F (36.4 C)-98.4 F (36.9 C)] 97.8 F (36.6 C) (03/20 0445) Pulse Rate:  [59-81] 59 (03/20 0740) Resp:  [11-20] 18 (03/20 0740) BP: (93-114)/(43-83) 108/62 mmHg (03/20 0445) SpO2:  [61 %-100 %] 96 % (03/20 0740)  Intake/Output from previous day: 03/19 0701 - 03/20 0700 In: 840 [P.O.:400; I.V.:240; IV Piggyback:200] Out: 660 [Urine:660] Intake/Output this shift: Total I/O In: 240 [P.O.:240] Out: -   Bilateral femoral incisions are healing quite nicely. Groin dressings removed. Steri-Strips intact. 3+ fem-fem graft pulse. Right leg with near total resolution of the extreme erythema and cellulitis that he had. His posterior calf and lateral ankle wounds do heparinization date with the current dressing. We'll switch to normal saline twice a day dressing changes.  Lab Results:  Recent Labs  05/01/13 0536 05/02/13 0400  WBC 8.4 9.8  HGB 10.3* 10.6*  HCT 29.8* 31.1*  PLT 182 155   BMET  Recent Labs  05/01/13 0536 05/02/13 0400  NA 136* 132*  K 3.7 4.3  CL 100 96  CO2 26 26  GLUCOSE 109* 105*  BUN 9 7  CREATININE 0.87 0.77  CALCIUM 8.3* 8.2*    Studies/Results: Dg Chest Port 1 View  04/24/2013   CLINICAL DATA Cough and congestion.  EXAM PORTABLE CHEST - 1 VIEW  COMPARISON DG CHEST 2 VIEW dated 01/29/2013; CT CHEST W/CM dated 02/02/2012; DG CHEST 2 VIEW dated 02/01/2012  FINDINGS Mediastinum and hilar structures are normal. Increased interstitial markings noted. Pneumonitis could present in this fashion. Underlying COPD most likely present. Changes of pleural parenchymal scarring noted. Heart size and pulmonary vascularity normal. No pneumothorax. No acute osseous abnormality. Degenerative changes both shoulders and thoracic spine.  IMPRESSION 1. Mild increased interstitial markings suggesting the possibility of  interstitial pneumonitis.  2. COPD.  SIGNATURE  Electronically Signed   By: Marcello Moores  Register   On: 04/24/2013 15:14   Dg Swallowing Func-speech Pathology  04/25/2013   Houston Siren, CCC-SLP     04/25/2013 10:47 AM Objective Swallowing Evaluation: Modified Barium Swallowing Study   Patient Details  Name: [name] MRN: [id number] Date of Birth: September 25, 1934  Today's Date: 04/25/2013 Time: 0347-4259 SLP Time Calculation (min): 20 min  Past Medical History:  Past Medical History  Diagnosis Date  . Hyperlipidemia   . History of colon polyps   . Spinal stenosis   . Anxiety   . BPH (benign prostatic hypertrophy)   . ED (erectile dysfunction)   . Osteoporosis   . Hypertension   . Chest pain     "I've had it" (01/31/2012)   . Peripheral vascular disease     "right leg is 100% blocked; left leg has stent, still ~ 75%  blocked" (01/31/2012)  . Peripheral neuropathy     "bad" (01/31/2012)  . COPD (chronic obstructive pulmonary disease)   . Emphysema   . Pneumonia 1960's    "double" (01/31/2012)  . BRBPR (bright red blood per rectum)     "don't know what it's from; had some this week" (01/31/2012)  . Cervical spondylosis   . Rheumatoid arthritis(714.0)   . Arthritis     "hands and feet" (01/31/2012)  . DDD (degenerative disc disease), lumbosacral     "S1; L5" (01/31/2012)  . Chronic lower back pain   . Depression     "imagine I've got a mild depression w/what  I've gone thru last  month" (01/31/2012)  . Prostate cancer 2004    "had 40 treatments of radiation" (01/31/2012)  . Breast cancer in male     "left" (01/31/2012)  . Basal cell carcinoma of nasal tip     "  . Pernicious anemia   . GERD (gastroesophageal reflux disease)   . Polyneuropathy in other diseases classified elsewhere 11/06/2012   . Lumbosacral spondylosis without myelopathy 11/06/2012   Past Surgical History:  Past Surgical History  Procedure Laterality Date  . Basal cell carcinoma excision      Nose x 3  . Cholecystectomy  ?1990's  . Wrist fusion      Right  Wrist; "3 OR's; it's fused" (01/31/2012)  . Excisional hemorrhoidectomy  1960's  . Knee surgery      "right; X 6 surgeries; went in for simple cartilage OR; ended  up w/fused knee" (01/31/2012)  . Iliac artery stent  12-07-09    Stent done by Dr. Gwenlyn Found  . Total hip arthroplasty  08/16/2011    Procedure: TOTAL HIP ARTHROPLASTY ANTERIOR APPROACH;  Surgeon:  Mcarthur Rossetti, MD;  Location: [place];  Service:  Orthopedics;  Laterality: Right;  Right total hip replacement  . Cervical disc surgery      "7 total; ended w/a fusion" (01/31/2012)  . Esophagogastroduodenoscopy  02/22/2012    Procedure: ESOPHAGOGASTRODUODENOSCOPY (EGD);  Surgeon: Arta Silence, MD;  Location: [facility];  Service: Endoscopy;   Laterality: N/A;  . Eus  02/22/2012    Procedure: UPPER ENDOSCOPIC ULTRASOUND (EUS) RADIAL;  Surgeon:  Arta Silence, MD;  Location: [facility];  Service: Endoscopy;   Laterality: N/A;  . Hernia repair    . Transurethral resection of prostate     HPI:  79 y/o male with chief complaint of right leg redness, sores and  pain.Dx Cellulitis with draining L. Leg wound and sepsis .   History of ACDF, significant COPD/emphesema, cervical  spondylosis.  RN reports increased coughing with meds, swallow  eval ordered.  CXR  Mild increased interstitial markings  suggesting the possibility of interstitial pneumonitis.     Assessment / Plan / Recommendation Clinical Impression  Dysphagia Diagnosis: Mild oral phase dysphagia;Mild pharyngeal  phase dysphagia;Moderate pharyngeal phase dysphagia Clinical impression: Pt. exhibited mild oral impairment  characterized by prolonged mastication likely due to dentures not  present (lower plate broken).  Pharyngeal phase evidenced by  sensory deficits that is characteristic of pt.'s with COPD.   Decreased pharyngeal sensation led to late swallow initiating at  pyriform sinsues with flash laryngeal penetration of cup sip  thin.  Moderate amount of aspiration with straw sips thin given   increased velocity and sip size with reflexive cough.  SLP  recommending Dys 2 diet texture and thin liquids, NO straws,  pills whole in applesauce, small sips sitting upright and rest  breaks when needed.  ST will continue to follow.     Treatment Recommendation  Therapy as outlined in treatment plan below    Diet Recommendation Dysphagia 2 (Fine chop);Thin liquid   Liquid Administration via: No straw;Cup Medication Administration: Whole meds with puree Supervision: Patient able to self feed Compensations: Slow rate;Small sips/bites (rest breaks) Postural Changes and/or Swallow Maneuvers: Seated upright 90  degrees    Other  Recommendations Oral Care Recommendations: Oral care BID   Follow Up Recommendations  None    Frequency and Duration min 2x/week  1 week   Pertinent Vitals/Pain WDL  Reason for Referral Objectively evaluate swallowing function   Oral Phase Oral Preparation/Oral Phase Oral Phase: Impaired Oral - Solids Oral - Regular: Delayed oral transit;Weak lingual manipulation   Pharyngeal Phase Pharyngeal Phase Pharyngeal Phase: Impaired Pharyngeal - Nectar Pharyngeal - Nectar Teaspoon: Delayed swallow  initiation;Premature spillage to pyriform sinuses Pharyngeal - Nectar Cup: Delayed swallow initiation;Premature  spillage to pyriform sinuses Pharyngeal - Thin Pharyngeal - Thin Teaspoon: Delayed swallow initiation;Premature  spillage to pyriform sinuses Pharyngeal - Thin Cup: Delayed swallow initiation;Premature  spillage to pyriform sinuses;Penetration/Aspiration during  swallow Penetration/Aspiration details (thin cup): Material enters  airway, remains ABOVE vocal cords then ejected out Pharyngeal - Thin Straw: Penetration/Aspiration during  swallow;Delayed swallow initiation;Premature spillage to pyriform  sinuses;Moderate aspiration;Reduced airway/laryngeal closure Penetration/Aspiration details (thin straw): Material enters  airway, passes BELOW cords and not ejected out despite cough   attempt by patient Pharyngeal - Solids Pharyngeal - Regular: Within functional limits  Cervical Esophageal Phase    GO    Cervical Esophageal Phase Cervical Esophageal Phase: [facility]         Cranford Mon.Ed CCC-SLP Pager [phone]  04/25/2013   Anti-infectives: Anti-infectives   Start     Dose/Rate Route Frequency Ordered Stop   04/27/13 0600  vancomycin (VANCOCIN) 1,250 mg in sodium chloride 0.9 % 250 mL IVPB     1,250 mg 166.7 mL/hr over 90 Minutes Intravenous Every 24 hours 04/26/13 2036     04/24/13 1430  ciprofloxacin (CIPRO) IVPB 400 mg     400 mg 200 mL/hr over 60 Minutes Intravenous Every 12 hours 04/24/13 1323     04/24/13 0800  vancomycin (VANCOCIN) IVPB 750 mg/150 ml premix  Status:  Discontinued     750 mg 150 mL/hr over 60 Minutes Intravenous Every 12 hours 04/23/13 2202 04/26/13 2036   04/21/13 1900  vancomycin (VANCOCIN) IVPB 750 mg/150 ml premix  Status:  Discontinued     750 mg 150 mL/hr over 60 Minutes Intravenous Every 24 hours 04/20/13 2034 04/23/13 2202   04/20/13 1800  vancomycin (VANCOCIN) IVPB 1000 mg/200 mL premix     1,000 mg 200 mL/hr over 60 Minutes Intravenous  Once 04/20/13 1748 04/20/13 1924      Assessment/Plan: s/p Procedure(s): BYPASS GRAFT FEMORAL-FEMORAL ARTERY/ LEFT - RIGHT (Bilateral) Stable 2 days after femoral to femoral bypass. Main issue continues to be pain control which is been a Ve[place] significant issue for the patient for many years. We'll switch to twice a day normal saline dressing changes   LOS: 13 days   Maricia Scotti 05/03/2013, 11:29 AM

## 2013-05-04 LAB — BASIC METABOLIC PANEL
BUN: 6 mg/dL (ref 6–23)
CO2: 29 mEq/L (ref 19–32)
Calcium: 7.9 mg/dL — ABNORMAL LOW (ref 8.4–10.5)
Chloride: 100 mEq/L (ref 96–112)
Creatinine, Ser: 0.88 mg/dL (ref 0.50–1.35)
GFR calc Af Amer: >90 mL/min (ref >90)
GFR, EST NON AFRICAN AMERICAN: 81 mL/min — AB (ref >90)
GLUCOSE: 112 mg/dL — AB (ref 70–99)
POTASSIUM: 3.7 meq/L (ref 3.7–5.3)
Sodium: 137 mEq/L (ref 137–147)

## 2013-05-04 LAB — CBC
HCT: 28 % — ABNORMAL LOW (ref 39.0–52.0)
HEMOGLOBIN: 9.4 g/dL — AB (ref 13.0–17.0)
MCH: 32.2 pg (ref 26.0–34.0)
MCHC: 33.6 g/dL (ref 30.0–36.0)
MCV: 95.9 fL (ref 78.0–100.0)
Platelets: 160 10*3/uL (ref 150–400)
RBC: 2.92 MIL/uL — ABNORMAL LOW (ref 4.22–5.81)
RDW: 14.8 % (ref 11.5–15.5)
WBC: 6.5 10*3/uL (ref 4.0–10.5)

## 2013-05-04 NOTE — Progress Notes (Signed)
Patient ID: [name], male   DOB: Sep 28, 1934, 79 y.o.   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 In addition patient reported intermittent episodes of dark stools. He had endoscopic ultrasound done 1 year ago which was unrevealing, colonoscopy by Dr. Oletta Lamas at June 2012 which showed small polyps and no other acute findings.   Assessment/Plan:  Acute on chronic blood loss anemia  - Endoscopy done 04/27/2013 -> duodenal ulcers, unclear etiology, ? ischemic ulcers given significant peripheral vascular disease  - No signs of bleeding per patient, continue protonic twice a day  - Hemoglobin remained stable ~ 9 -10 (has required 2 units of transfusion while inpatient 03/16)  PAD  - per vascular, s/p- Left to right femoral-femoral bypass with 8 mm Hemashield graft  - Postop day #3, patient still in pain, reports medicine is not lasting long  - Continue twice daily normal saline dressing changes  Cellulitis with open ulcers  - Left leg with calf ulceration  - Slowly improving on vancomycin and ciprofloxacin, plan d/c ABX in AM which will complete 2 weeks ABX therapy   Hypokalemia  - resolved  Chronic dysphagia  - Continue dysphagia 2 diet  COPD  - clinically compensated, patient maintaining oxygen saturations at target range  Functional quadriplegia  - Secondary to acute on chronic illnesses outlined above  - Will need SNF once medically stable  Protein calorie malnutrition, severe  - Patient meets criteria in the context of chronic illness and  underweight  Rheumatoid arthritis  - on chronic prednisone   Code Status: full  Family Communication: patient  Disposition Plan: Will go to SNF once medically ready   Consultants:   Vascular surgery- Dr Donnetta Hutching   GI Procedures:   1.Arteriogram   2. Left to right femoral-femoral bypass with 8 mm Hemashield graft- 3/18  Antibiotics:   vanc started on 3/7   cipro 3/11  HPI/Subjective: No events overnight.   Objective: Filed Vitals:   05/03/13 1400 05/03/13 1944 05/04/13 0620 05/04/13 0823  BP: 111/61 112/51 106/46   Pulse: 81 74 64   Temp: 97.2 F (36.2 C) 98.2 F (36.8 C) 97.7 F (36.5 C)   TempSrc: Oral Oral Oral   Resp: 20 20 17    Height:      Weight:      SpO2: 91% 99% 100% 99%    Intake/Output Summary (Last 24 hours) at 05/04/13 1245 Last data filed at 05/04/13 0428  Gross per 24 hour  Intake    480 ml  Output    925 ml  Net   -445 ml    Exam:   General:  Pt is alert, follows commands appropriately, not in acute distress  Cardiovascular: Regular rate and rhythm, S1/S2, no murmurs, no rubs, no gallops  Respiratory: Clear to auscultation bilaterally, no wheezing, no crackles, no rhonchi  Abdomen: Soft, non tender, non distended, bowel sounds present, no guarding  Data Reviewed: Basic Metabolic Panel:  Recent Labs Lab 04/30/13 0722 04/30/13 0955 05/01/13 0536 05/02/13 0400 05/04/13 0645  NA 136* 135* 136* 132* 137  K 4.2 4.3 3.7 4.3 3.7  CL  101 98 100 96 100  CO2 25 25 26 26 29   GLUCOSE 95 94 109* 105* 112*  BUN 10 10 9 7 6   CREATININE 0.77 0.76 0.87 0.77 0.88  CALCIUM 8.1* 8.5 8.3* 8.2* 7.9*   CBC:  Recent Labs Lab 04/29/13 0540 04/30/13 0955 05/01/13 0536 05/02/13 0400 05/04/13 0645  WBC  --  14.3* 8.4 9.8 6.5  HGB 7.4* 10.8* 10.3* 10.6* 9.4*  HCT 21.0* 31.2* 29.8* 31.1* 28.0*  MCV  --  92.9 92.8 94.0 95.9  PLT  --  214 182 155 160    Recent Results (from the past 240 hour(s))  CLOSTRIDIUM DIFFICILE BY PCR     Status:  None   Collection Time    04/25/13  1:59 PM      Result Value Ref Range Status   C difficile by pcr NEGATIVE  NEGATIVE Final  SURGICAL PCR SCREEN     Status: None   Collection Time    05/01/13  1:52 AM      Result Value Ref Range Status   MRSA, PCR NEGATIVE  NEGATIVE Final   Staphylococcus aureus NEGATIVE  NEGATIVE Final   Comment:            The Xpert SA Assay (FDA     approved for NASAL specimens     in patients over 27 years of age),     is one component of     a comprehensive surveillance     program.  Test performance has     been validated by [employer] for patients greater     than or equal to 77 year old.     It is not intended     to diagnose infection nor to     guide or monitor treatment.     Scheduled Meds: . ciprofloxacin  400 mg Intravenous Q12H  . diazepam  2 mg Oral QHS  . enoxaparin (LOVENOX) injection  40 mg Subcutaneous Q24H  . feeding supplement (RESOURCE BREEZE)  1 Container Oral TID BM  . gabapentin  300 mg Oral TID  . mometasone-formoterol  2 puff Inhalation BID  . pantoprazole  40 mg Oral BID AC  . prednisoLONE  5 mg Oral Q breakfast  . sertraline  50 mg Oral Daily  . tiotropium  18 mcg Inhalation Daily  . traZODone  50 mg Oral QHS  . vancomycin  1,250 mg Intravenous Q24H   Continuous Infusions: . lactated ringers 20 mL/hr at 05/02/13 Lulu Riding, MD  [facility] Pager [phone]  If 7PM-7AM, please contact night-coverage [email or web address] Password [email or web address] 05/04/2013, 12:45 PM   LOS: 14 days

## 2013-05-04 NOTE — Progress Notes (Signed)
   VASCULAR PROGRESS NOTE  SUBJECTIVE: Multiple non specific complaints.  PHYSICAL EXAM: Filed Vitals:   05/03/13 0740 05/03/13 1400 05/03/13 1944 05/04/13 0620  BP:  111/61 112/51 106/46  Pulse: 59 81 74 64  Temp:  97.2 F (36.2 C) 98.2 F (36.8 C) 97.7 F (36.5 C)  TempSrc:  Oral Oral Oral  Resp: 18 20 20 17   Height:      Weight:      SpO2: 96% 91% 99% 100%   Palpable graft pulse. Incisions look good Dressing on right leg is dry  LABS: Lab Results  Component Value Date   WBC 9.8 05/02/2013   HGB 10.6* 05/02/2013   HCT 31.1* 05/02/2013   MCV 94.0 05/02/2013   PLT 155 05/02/2013   Lab Results  Component Value Date   CREATININE 0.77 05/02/2013   Lab Results  Component Value Date   INR 1.13 04/24/2013   CBG (last 3)  No results found for this basename: GLUCAP,  in the last 72 hours  Active Problems:   Chronic total occlusion of artery of the extremities   COPD (chronic obstructive pulmonary disease)   Polyneuropathy in other diseases classified elsewhere   Cellulitis   Sepsis   Dysphagia, unspecified(787.20)   Diarrhea   Cough   GI bleed   Protein-calorie malnutrition, severe   Acute blood loss anemia  ASSESSMENT AND PLAN:  * 3 Days Post-Op s/p: Left to Right Fem Fem Bypass Graft.  *  Continue dressing changes.  Gae Gallop Beeper: [phone] 05/04/2013

## 2013-05-04 NOTE — Progress Notes (Signed)
Wet to dry dressing on rt lower calf

## 2013-05-05 MED ORDER — SODIUM CHLORIDE 0.9 % IJ SOLN
3.0000 mL | Freq: Two times a day (BID) | INTRAMUSCULAR | Status: DC
  Administered 2013-05-05 – 2013-05-07 (×3): 3 mL via INTRAVENOUS

## 2013-05-05 MED ORDER — SODIUM CHLORIDE 0.9 % IJ SOLN: 3.0000 mL | INTRAMUSCULAR | Status: DC | PRN

## 2013-05-05 NOTE — Progress Notes (Signed)
Patient ID: [name], male   DOB: 1935-02-06, 79 y.o.   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 In addition patient reported intermittent episodes of dark stools. He had endoscopic ultrasound done 1 year ago which was unrevealing, colonoscopy by Dr. Oletta Lamas at June 2012 which showed small polyps and no other acute findings.   Assessment/Plan:  Acute on chronic blood loss anemia  - Endoscopy done 04/27/2013 -> duodenal ulcers, unclear etiology, ? ischemic ulcers given significant peripheral vascular disease  - No signs of bleeding per patient, continue protonic twice a day  - Hemoglobin remained stable ~ 9 -10 (has required 2 units of transfusion while inpatient 03/16)  PAD  - per vascular, s/p- Left to right femoral-femoral bypass with 8 mm Hemashield graft  - Postop day #4, patient still in pain, reports medicine is not lasting long  - Continue twice daily normal saline dressing changes  Cellulitis with open ulcers  - Left leg with calf ulceration  - Slowly improving on vancomycin and ciprofloxacin, plan d/c ABX today Hypokalemia  - resolved  Chronic dysphagia  - Continue dysphagia 2 diet, pt tolerating well  COPD  - clinically compensated, patient maintaining oxygen saturations at target range  Functional quadriplegia  - Secondary to acute on chronic illnesses outlined above  - Will need SNF once medically stable  Protein calorie malnutrition, severe  - Patient meets criteria in the context of chronic illness and underweight  Rheumatoid  arthritis  - on chronic prednisone   Code Status: full  Family Communication: patient  Disposition Plan: Will go to SNF once medically ready   Consultants:  Vascular surgery- Dr Donnetta Hutching  GI Procedures:  1.Arteriogram  2. Left to right femoral-femoral bypass with 8 mm Hemashield graft- 3/18  Antibiotics:  vanc started on 3/7  cipro 3/11  HPI/Subjective: No events overnight.   Objective: Filed Vitals:   05/04/13 0823 05/04/13 1451 05/04/13 1958 05/05/13 0441  BP:  118/68 102/52 98/43  Pulse:  71 73 68  Temp:  98.1 F (36.7 C) 98.1 F (36.7 C) 98.7 F (37.1 C)  TempSrc:   Oral Oral  Resp:  20 18 17   Height:      Weight:      SpO2: 99% 94% 93% 93%    Intake/Output Summary (Last 24 hours) at 05/05/13 0740 Last data filed at 05/05/13 0442  Gross per 24 hour  Intake    600 ml  Output   1875 ml  Net  -1275 ml    Exam:   General:  Pt is alert, follows commands appropriately, not in acute distress  Cardiovascular: Regular rate and rhythm, S1/S2, no murmurs, no rubs, no gallops  Respiratory: Clear to auscultation bilaterally, no wheezing, no crackles, no rhonchi  Abdomen: Soft, non tender, non distended, bowel sounds present, no guarding  Data Reviewed: Basic Metabolic Panel:  Recent Labs Lab 04/30/13 0722 04/30/13 0955 05/01/13 0536 05/02/13 0400 05/04/13 0645  NA 136* 135* 136* 132* 137  K 4.2 4.3 3.7 4.3 3.7  CL 101 98 100 96 100  CO2 25 25 26 26 29   GLUCOSE  95 94 109* 105* 112*  BUN 10 10 9 7 6   CREATININE 0.77 0.76 0.87 0.77 0.88  CALCIUM 8.1* 8.5 8.3* 8.2* 7.9*   Liver Function Tests: No results found for this basename: AST, ALT, ALKPHOS, BILITOT, PROT, ALBUMIN,  in the last 168 hours No results found for this basename: LIPASE, AMYLASE,  in the last 168 hours No results found for this basename: AMMONIA,  in the last 168 hours CBC:  Recent Labs Lab 04/29/13 0540 04/30/13 0955 05/01/13 0536 05/02/13 0400 05/04/13 0645  WBC  --  14.3* 8.4  9.8 6.5  HGB 7.4* 10.8* 10.3* 10.6* 9.4*  HCT 21.0* 31.2* 29.8* 31.1* 28.0*  MCV  --  92.9 92.8 94.0 95.9  PLT  --  214 182 155 160   Cardiac Enzymes: No results found for this basename: CKTOTAL, CKMB, CKMBINDEX, TROPONINI,  in the last 168 hours BNP: No components found with this basename: POCBNP,  CBG: No results found for this basename: GLUCAP,  in the last 168 hours  Recent Results (from the past 240 hour(s))  CLOSTRIDIUM DIFFICILE BY PCR     Status: None   Collection Time    04/25/13  1:59 PM      Result Value Ref Range Status   C difficile by pcr NEGATIVE  NEGATIVE Final  SURGICAL PCR SCREEN     Status: None   Collection Time    05/01/13  1:52 AM      Result Value Ref Range Status   MRSA, PCR NEGATIVE  NEGATIVE Final   Staphylococcus aureus NEGATIVE  NEGATIVE Final   Comment:            The Xpert SA Assay (FDA     approved for NASAL specimens     in patients over 12 years of age),     is one component of     a comprehensive surveillance     program.  Test performance has     been validated by [employer] for patients greater     than or equal to 3 year old.     It is not intended     to diagnose infection nor to     guide or monitor treatment.     Scheduled Meds: . ciprofloxacin  400 mg Intravenous Q12H  . diazepam  2 mg Oral QHS  . enoxaparin (LOVENOX) injection  40 mg Subcutaneous Q24H  . feeding supplement (RESOURCE BREEZE)  1 Container Oral TID BM  . gabapentin  300 mg Oral TID  . mometasone-formoterol  2 puff Inhalation BID  . pantoprazole  40 mg Oral BID AC  . prednisoLONE  5 mg Oral Q breakfast  . sertraline  50 mg Oral Daily  . tiotropium  18 mcg Inhalation Daily  . traZODone  50 mg Oral QHS  . vancomycin  1,250 mg Intravenous Q24H   Continuous Infusions: . lactated ringers 20 mL/hr at 05/02/13 Lulu Riding, MD  [facility] Pager [phone]  If 7PM-7AM, please contact night-coverage [email or web address] Password [facility] 05/05/2013, 7:40  AM   LOS: 15 days

## 2013-05-05 NOTE — Progress Notes (Addendum)
[facility] Progress Note  05/05/2013 9:49 AM 4 Days Post-Op  Subjective:  C/o pain, but states he had pain before surgery and is now a little better, but still hurts.  Afebrile VSS 91% 2LO2NC (was on RA yesterday)  Filed Vitals:   05/05/13 0441  BP: 98/43  Pulse: 68  Temp: 98.7 F (37.1 C)  Resp: 17    Physical Exam: Incisions:  Bilateral groin incisions are healing nicely. Extremities:  + doppler signal in fem/fem bypass; + biphasic doppler signal in right PT.  Bilateral feet are warm and well perfused.  Dressing to right leg is clean and intact.  CBC    Component Value Date/Time   WBC 6.5 05/04/2013 0645   RBC 2.92* 05/04/2013 0645   RBC 2.92* 08/17/2011 0645   HGB 9.4* 05/04/2013 0645   HCT 28.0* 05/04/2013 0645   PLT 160 05/04/2013 0645   MCV 95.9 05/04/2013 0645   MCH 32.2 05/04/2013 0645   MCHC 33.6 05/04/2013 0645   RDW 14.8 05/04/2013 0645   LYMPHSABS 0.7 01/31/2012 2006   MONOABS 0.3 01/31/2012 2006   EOSABS 0.0 01/31/2012 2006   BASOSABS 0.0 01/31/2012 2006    BMET    Component Value Date/Time   NA 137 05/04/2013 0645   K 3.7 05/04/2013 0645   CL 100 05/04/2013 0645   CO2 29 05/04/2013 0645   GLUCOSE 112* 05/04/2013 0645   BUN 6 05/04/2013 0645   CREATININE 0.88 05/04/2013 0645   CALCIUM 7.9* 05/04/2013 0645   GFRNONAA 81* 05/04/2013 0645   GFRAA >90 05/04/2013 0645    INR    Component Value Date/Time   INR 1.13 04/24/2013 0600     Intake/Output Summary (Last 24 hours) at 05/05/13 0949 Last data filed at 05/05/13 0442  Gross per 24 hour  Intake    600 ml  Output   1875 ml  Net  -1275 ml     Assessment:  79 y.o. male is s/p:  Left to right femoral-femoral bypass with 8 mm Hemashield graft  4 Days Post-Op  Plan: -pt needs to be up in chair tid-RN states pt refused OOB yesterday.  Pt says he's agreeable today. -DVT prophylaxis:  Lovenox -ABx discontinued per internal medicine -continue dressing changes to right lower leg -now on  2LO2NC as his O2 sats were 83% this am on RA - after O2NC placed, pt O2 sats were up to 91%.  Continue IS every hour while awake.   Leontine Locket, PA-C [facility] [phone] 05/05/2013 9:49 AM  Agree with above.  Palpable graft pulse. Steady progress.  Deitra Mayo, MD, FACS Beeper [phone] 05/05/2013

## 2013-05-05 NOTE — Progress Notes (Signed)
RT Note:Patient was found on room air this AM with an Spo2 of 83%. His oxygen was connected but the cannula was not on the patient. RT placed the nasal cannula back on the patient at 2lpm and his Spo2 increased to 91%. Rt will continue to monitor.

## 2013-05-06 LAB — CBC
HEMATOCRIT: 31.6 % — AB (ref 39.0–52.0)
Hemoglobin: 10.7 g/dL — ABNORMAL LOW (ref 13.0–17.0)
MCH: 32.6 pg (ref 26.0–34.0)
MCHC: 33.9 g/dL (ref 30.0–36.0)
MCV: 96.3 fL (ref 78.0–100.0)
PLATELETS: 163 10*3/uL (ref 150–400)
RBC: 3.28 MIL/uL — ABNORMAL LOW (ref 4.22–5.81)
RDW: 14.9 % (ref 11.5–15.5)
WBC: 8.1 10*3/uL (ref 4.0–10.5)

## 2013-05-06 LAB — BASIC METABOLIC PANEL
BUN: 10 mg/dL (ref 6–23)
CO2: 26 mEq/L (ref 19–32)
CREATININE: 0.76 mg/dL (ref 0.50–1.35)
Calcium: 8.2 mg/dL — ABNORMAL LOW (ref 8.4–10.5)
Chloride: 100 mEq/L (ref 96–112)
GFR calc Af Amer: >90 mL/min (ref >90)
GFR, EST NON AFRICAN AMERICAN: 86 mL/min — AB (ref >90)
Glucose, Bld: 89 mg/dL (ref 70–99)
Potassium: 5.6 mEq/L — ABNORMAL HIGH (ref 3.7–5.3)
Sodium: 135 mEq/L — ABNORMAL LOW (ref 137–147)

## 2013-05-06 LAB — OCCULT BLOOD X 1 CARD TO LAB, STOOL: Fecal Occult Bld: POSITIVE — AB

## 2013-05-06 MED ORDER — LORAZEPAM 1 MG PO TABS: 1.0000 mg | ORAL_TABLET | Freq: Once | ORAL | Status: DC

## 2013-05-06 MED ORDER — LORAZEPAM 0.5 MG PO TABS
0.5000 mg | ORAL_TABLET | Freq: Once | ORAL | Status: AC
  Administered 2013-05-06: 0.5 mg via ORAL
  Filled 2013-05-06: qty 1

## 2013-05-06 NOTE — Progress Notes (Signed)
Patient ID: [name], male   DOB: 14-Aug-1934, 79 y.o.   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 In addition patient reported intermittent episodes of dark stools. He had endoscopic ultrasound done 1 year ago which was unrevealing, colonoscopy by Dr. Oletta Lamas at June 2012 which showed small polyps and no other acute findings.   Assessment/Plan:  Acute on chronic blood loss anemia  - Endoscopy done 04/27/2013 -> duodenal ulcers, unclear etiology, ? ischemic ulcers given significant peripheral vascular disease  - No signs of bleeding per patient, continue protonic twice a day  - Hemoglobin remained stable ~ 9 -10 (has required 2 units of transfusion while inpatient 03/16)  PAD  - per vascular, s/p- Left to right femoral-femoral bypass with 8 mm Hemashield graft  - Postop day #5, patient still in pain, reports medicine is not lasting long  - Continue twice daily normal saline dressing changes  Cellulitis with open ulcers  - Left leg with calf ulceration  - Slowly improving on vancomycin and ciprofloxacin, d/c ABX (3/22) Hypokalemia  - supplemented and pt now with slightly elevated K - will repeat BMP in AM Chronic dysphagia  - Continue dysphagia 2 diet, pt tolerating well  COPD  - clinically compensated, patient maintaining oxygen saturations at target range  Functional quadriplegia  - Secondary to acute on chronic illnesses outlined above  - Will need SNF once medically stable  Protein calorie malnutrition, severe  - Patient meets criteria in  the context of chronic illness and underweight  Rheumatoid arthritis  - on chronic prednisone   Code Status: full  Family Communication: patient  Disposition Plan: Will go to SNF once medically ready   Consultants:  Vascular surgery- Dr Donnetta Hutching  GI Procedures:  1.Arteriogram  2. Left to right femoral-femoral bypass with 8 mm Hemashield graft- 3/18  Antibiotics:  vanc started on 3/7  cipro 3/11  HPI/Subjective: No events overnight.   Objective: Filed Vitals:   05/06/13 0436 05/06/13 0819 05/06/13 1100 05/06/13 1314  BP: 103/45 92/46  107/63  Pulse: 67 77  91  Temp: 98.3 F (36.8 C)   98.2 F (36.8 C)  TempSrc: Oral   Oral  Resp: 16   19  Height:      Weight:      SpO2: 95%  96% 96%    Intake/Output Summary (Last 24 hours) at 05/06/13 1443 Last data filed at 05/06/13 0600  Gross per 24 hour  Intake    240 ml  Output    450 ml  Net   -210 ml    Exam:   General:  Pt is alert, follows commands appropriately, not in acute distress  Cardiovascular: Regular rate and rhythm, S1/S2, no murmurs, no rubs, no gallops  Respiratory: Clear to auscultation bilaterally, no wheezing, no crackles, no rhonchi  Abdomen: Soft, non tender, non distended, bowel sounds present, no guarding  Extremities: pulses DP and PT palpable bilaterally  Neuro: Grossly nonfocal  Data Reviewed: Basic Metabolic Panel:  Recent Labs Lab 04/30/13 0955 05/01/13 0536 05/02/13 0400 05/04/13 0645 05/06/13 0506  NA 135* 136* 132* 137 135*  K 4.3 3.7 4.3 3.7 5.6*  CL 98 100 96 100 100  CO2 25 26 26 29 26   GLUCOSE 94 109* 105* 112* 89  BUN 10 9 7 6 10   CREATININE 0.76 0.87 0.77 0.88 0.76  CALCIUM 8.5 8.3* 8.2* 7.9* 8.2*   CBC:  Recent Labs Lab 04/30/13 0955 05/01/13 0536 05/02/13 0400 05/04/13 0645 05/06/13 0810  WBC 14.3* 8.4 9.8 6.5 8.1  HGB 10.8* 10.3* 10.6* 9.4* 10.7*  HCT 31.2* 29.8* 31.1* 28.0* 31.6*  MCV 92.9 92.8 94.0 95.9 96.3  PLT 214 182 155 160 163   Recent Results  (from the past 240 hour(s))  SURGICAL PCR SCREEN     Status: None   Collection Time    05/01/13  1:52 AM      Result Value Ref Range Status   MRSA, PCR NEGATIVE  NEGATIVE Final   Staphylococcus aureus NEGATIVE  NEGATIVE Final   Comment:            The Xpert SA Assay (FDA     approved for NASAL specimens     in patients over 59 years of age),     is one component of     a comprehensive surveillance     program.  Test performance has     been validated by [employer] for patients greater     than or equal to 8 year old.     It is not intended     to diagnose infection nor to     guide or monitor treatment.     Scheduled Meds: . diazepam  2 mg Oral QHS  . enoxaparin (LOVENOX) injection  40 mg Subcutaneous Q24H  . feeding supplement (RESOURCE BREEZE)  1 Container Oral TID BM  . gabapentin  300 mg Oral TID  . mometasone-formoterol  2 puff Inhalation BID  . pantoprazole  40 mg Oral BID AC  . prednisoLONE  5 mg Oral Q breakfast  . sertraline  50 mg Oral Daily  . sodium chloride  3 mL Intravenous Q12H  . tiotropium  18 mcg Inhalation Daily  . traZODone  50 mg Oral QHS   Continuous Infusions: . lactated ringers 20 mL/hr at 05/02/13 Lulu Riding, MD  [facility] Pager [phone]  If 7PM-7AM, please contact night-coverage [email or web address] Password [facility] 05/06/2013, 2:43 PM   LOS: 16 days

## 2013-05-06 NOTE — Progress Notes (Addendum)
[facility] Progress Note  05/06/2013 7:41 AM 5 Days Post-Op  Subjective:  C/o pain in his right leg  Afebrile VSS 95% 2LO2NC  Filed Vitals:   05/06/13 0436  BP: 103/45  Pulse: 67  Temp: 98.3 F (36.8 C)  Resp: 16    Physical Exam: Incisions:  Bilateral groin incisions are c/d/i Extremities:  Bilateral feet are warm; + palpable graft pulse; dressing on leg is dry and in tact.  CBC    Component Value Date/Time   WBC 6.5 05/04/2013 0645   RBC 2.92* 05/04/2013 0645   RBC 2.92* 08/17/2011 0645   HGB 9.4* 05/04/2013 0645   HCT 28.0* 05/04/2013 0645   PLT 160 05/04/2013 0645   MCV 95.9 05/04/2013 0645   MCH 32.2 05/04/2013 0645   MCHC 33.6 05/04/2013 0645   RDW 14.8 05/04/2013 0645   LYMPHSABS 0.7 01/31/2012 2006   MONOABS 0.3 01/31/2012 2006   EOSABS 0.0 01/31/2012 2006   BASOSABS 0.0 01/31/2012 2006    BMET    Component Value Date/Time   NA 135* 05/06/2013 0506   K 5.6* 05/06/2013 0506   CL 100 05/06/2013 0506   CO2 26 05/06/2013 0506   GLUCOSE 89 05/06/2013 0506   BUN 10 05/06/2013 0506   CREATININE 0.76 05/06/2013 0506   CALCIUM 8.2* 05/06/2013 0506   GFRNONAA 86* 05/06/2013 0506   GFRAA >90 05/06/2013 0506    INR    Component Value Date/Time   INR 1.13 04/24/2013 0600     Intake/Output Summary (Last 24 hours) at 05/06/13 0741 Last data filed at 05/05/13 1800  Gross per 24 hour  Intake    920 ml  Output    475 ml  Net    445 ml     Assessment:  79 y.o. male is s/p:  Left to right femoral-femoral bypass with 8 mm Hemashield graft   5 Days Post-Op  Plan: -pt still c/o pain, but pain medication is helping -fem/fem graft is palpable -slightly hyperkalemic this am at 5.6 (3.7 two days ago); renal function is normal and pt is not receiving any potassium. -check BMP/CBC in the am -DVT prophylaxis:  Lovenox -+ hemoccult-check CBC today -perirectal area red and excoriated per RN-will order WOC consult to see pt.  Antifungal powder applied by RN  and attempts to keep pt on side instead of back.    Leontine Locket, PA-C [facility] [phone] 05/06/2013 7:41 AM    I have examined the patient, reviewed and agree with above.Groin wds healing.  Cellulitis resolved from right leg.  Pain control still an issue  Nainoa Woldt, MD 05/06/2013 10:19 AM

## 2013-05-06 NOTE — Progress Notes (Signed)
Physical Therapy Treatment Patient Details Name: [name] MRN: [id number] DOB: 1934-09-28 Today's Date: 05/06/2013 Time: 5053-9767 PT Time Calculation (min): 50 min  PT Assessment / Plan / Recommendation  History of Present Illness [name] is a 79 y.o. male who presents to the ED with RLE swelling and erythema x 1 week.  Progressively worsening.  Worse with movement.  There are ulcers present around his ankle.  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  S/P fem-fem BPG. 3/23 limited by scrotal edema and skin excoriation due to incontinence   PT Comments   Pt profoundly weak and limited by pain due to scrotal edema, excoriation of perineum and buttocks due to incontinence, and pain from surgery. Unable to ambulate today after OOB to [facility] and standing for pericare. Pt's skin integrity and edema did not allow pt to sit up in chair and returned to sidelying in bed. ? If pt could benefit from an air mattress. Emphasized need to call for nursing assist any time he even suspects he may have been incontinent.   Follow Up Recommendations  SNF;Supervision/Assistance - 24 hour     Does the patient have the potential to tolerate intense rehabilitation     Barriers to Discharge        Equipment Recommendations  None recommended by PT    Recommendations for Other Services    Frequency Min 3X/week   Progress towards PT Goals Progress towards PT goals: Not progressing toward goals - comment (limited by pain/weakness this date)  Plan Current plan remains appropriate    Precautions / Restrictions Precautions Precautions: Fall Precaution Comments: right knee fused Required Braces or Orthoses: Other Brace/Splint Other Brace/Splint:  PRAFO on R   Pertinent Vitals/Pain Not rated, however appeared to be 9-10/10 based on facial grimacing and verbalizations (RLE, surgical incisions, and perineum)    Mobility  Bed Mobility Overal bed mobility:  Needs Assistance Bed Mobility: Sit to Supine;Supine to Sit;Rolling Rolling: Min assist Supine to sit: Mod assist;HOB elevated;+2 for physical assistance Sit to supine: +2 for physical assistance;Total assist General bed mobility comments: Additional rolling for pericare on return to bed; Significant assist due to pain and  UE weakness to come to EOB; on return to bed, pt with incontinence and sat on very EOB, therefore required incr assist for safe return to supine Transfers Overall transfer level: Needs assistance Equipment used: Rolling walker (2 wheeled) Transfers: Sit to/from [employer] Transfers Sit to Stand: Max assist;+2 physical assistance Stand pivot transfers: Max assist;+2 physical assistance General transfer comment: to pt's Lt to [facility] pt required mod assist x 2 to pivot with RW, however became very weak with standing for pericare and going back to his Rt, he required Max assist of 2 to pivot (poor use of UEs and Lt leg beginning to buckle) Ambulation/Gait General Gait Details: Unable to ambulate after transfer to [facility] and standing x 2 minutes for pericare due to fatigue    Exercises     PT Diagnosis:    PT Problem List:   PT Treatment Interventions:     PT Goals (current goals can now be found in the care plan section) Acute Rehab PT Goals Patient Stated Goal: be able to walk again and care for myself  Visit Information  Last PT Received On: 05/06/13 Assistance Needed: +2 History of Present Illness: [name] is a 79 y.o. male who presents to the ED with RLE swelling and  erythema x 1 week.  Progressively worsening.  Worse with movement.  There are ulcers present around his ankle.  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  S/P fem-fem BPG. 3/23 limited by scrotal edema and skin excoriation due to incontinence    Subjective Data  Patient Stated Goal: be able to walk again and care for myself   Cognition   Cognition Arousal/Alertness: Awake/alert Behavior During Therapy: [facility] for tasks assessed/performed Overall Cognitive Status: Within Functional Limits for tasks assessed    Balance  Balance Sitting balance-Leahy Scale: Poor Sitting balance - Comments: requires UE support for pain/scrotal edema  Standing balance-Leahy Scale: Zero Standing balance comment: for pericare progressed from mod assist to static stand to max assist as he fatigued General Comments General comments (skin integrity, edema, etc.): recommended to RN that pt not sit in chair due to extent of excoriation over his bottom  End of Session PT - End of Session Equipment Utilized During Treatment: Gait belt Activity Tolerance: Patient limited by pain;Patient limited by fatigue Patient left: in bed;with call bell/phone within reach;with nursing/sitter in room Nurse Communication: Mobility status;Other (comment) (incontinent of bowel and bladder prior to arrival)   GP     Jaelynne Hockley 05/06/2013, 4:25 PM Pager [phone]

## 2013-05-06 NOTE — Progress Notes (Signed)
Occupational Therapy Treatment Patient Details Name: [name] MRN: [id number] DOB: [birth year] Today's Date: 05/06/2013 Time: 6283-1517 OT Time Calculation (min): 50 min  OT Assessment / Plan / Recommendation  History of present illness [name] is a 79 y.o. male who presents to the ED with RLE swelling and erythema x 1 week.  Progressively worsening.  Worse with movement.  There are ulcers present around his ankle.  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  S/P fem-fem BPG. 3/23 limited by scrotal edema and skin excoriation due to incontinence   OT comments  This 79 yo male not really making progress due to pain in his RLE (knee to toes), fused left knee, Bil groin incisions, excoriated buttocks/genital area all affecting pt's ability to take care of himself. Will continue to benefit from acute OT with follow up at SNF.  Follow Up Recommendations  SNF;Supervision/Assistance - 24 hour       Equipment Recommendations  None recommended by OT       Frequency Min 2X/week   Progress towards OT Goals Progress towards OT goals: Not progressing toward goals - comment (due to pain mostly and fatigue as well)  Plan Discharge plan remains appropriate    Precautions / Restrictions Precautions Precautions: Fall Precaution Comments: right knee fused Required Braces or Orthoses: Other Brace/Splint Other Brace/Splint:  PRAFO on R   Pertinent Vitals/Pain Pt asked for pain meds at beginning of session; however not time as of yet, RN was able to give him his neurotin; 10/10 pain at end of session; made RN aware and she    ADL  Toilet Transfer: +2 Total assistance Toilet Transfer: Patient Percentage: 50% Toilet Transfer Method: Sit to stand Toilet Transfer Equipment: Bedside commode Toileting - Clothing Manipulation and Hygiene: +1 Total assistance Where Assessed - Toileting Clothing Manipulation and Hygiene: Supine, head of bed flat;Rolling  right and/or left (attempted in standing but pt unable to tolerate ) Equipment Used: Gait belt;Rolling walker Transfers/Ambulation Related to ADLs: total A +2 (mod A) sit<>stand; total A +2 (max A) stand pivot      OT Goals(current goals can now be found in the care plan section) Acute Rehab OT Goals Patient Stated Goal: be able to walk again and care for myself  Visit Information  Last OT Received On: 05/06/13 Assistance Needed: +2 PT/OT/SLP Co-Evaluation/Treatment: Yes Reason for Co-Treatment: For patient/therapist safety OT goals addressed during session: ADL's and self-care History of Present Illness: [name] is a 79 y.o. male who presents to the ED with RLE swelling and erythema x 1 week.  Progressively worsening.  Worse with movement.  There are ulcers present around his ankle.  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  S/P fem-fem BPG. 3/23 limited by scrotal edema and skin excoriation due to incontinence          Cognition  Cognition Arousal/Alertness: Awake/alert Behavior During Therapy: WFL for tasks assessed/performed Overall Cognitive Status: Within Functional Limits for tasks assessed    Mobility  Bed Mobility Overal bed mobility: Needs Assistance Bed Mobility: Sit to Supine;Supine to Sit;Rolling Rolling: Min assist Supine to sit: Mod assist;HOB elevated;+2 for physical assistance Sit to supine: +2 for physical assistance;Total assist General bed mobility comments: Additional rolling for pericare on return to bed; Significant assist due to pain and  UE weakness to come to EOB; on return to bed, pt with incontinence and  sat on very EOB, therefore required incr assist for safe return to supine Transfers Overall transfer level: Needs assistance Equipment used: Rolling walker (2 wheeled) Transfers: Sit to/from [facility] Sit to Stand: Max assist;+2 physical assistance Stand pivot transfers: Max  assist;+2 physical assistance General transfer comment: to pt's Lt to [facility] pt required mod assist x 2 to pivot with RW, however became very weak with standing for pericare and going back to his Rt, he required Max assist of 2 to pivot (poor use of UEs and Lt leg beginning to buckle)       Balance Balance Sitting balance-Leahy Scale: Poor Sitting balance - Comments: requires UE support for pain/scrotal edema  Standing balance-Leahy Scale: Zero Standing balance comment: for pericare progressed from mod assist to static stand to max assist as he fatigued General Comments General comments (skin integrity, edema, etc.): recommended to RN that pt not sit in chair due to extent of excoriation over his bottom  End of Session OT - End of Session Equipment Utilized During Treatment: Gait belt;Rolling walker Activity Tolerance: Patient limited by fatigue;Patient limited by pain Patient left: in bed;with call bell/phone within reach (laying on his left side) Nurse Communication: Patient requests pain meds;Mobility status; a combination of barrier cream and microguard powder was applied to pt's buttocks and groin area.       Almon Register [phone] 05/06/2013, 6:42 PM

## 2013-05-06 NOTE — Progress Notes (Signed)
Pt's perirectal area is dark red with satellite lesions and excoriated. Antifungal powder applied. Attempts to keep pt on his side. He always moves back to his back. Pillows placed to keep pt on side as much as possible. Unable to continue to use condom cath due to penis and scrotal edema.

## 2013-05-06 NOTE — Consult Note (Signed)
WOC wound consult note Reason for Consult: Consult requested for buttocks wounds.  Pt is followed by [facility] team for assessment and plan of care to leg wounds. Please refer to them for further questions.  Pt has been having frequent incontinent stools and admits to sitting in a recliner chair most of the day when at home. Wound type: Patchy areas of red partial thickness skin loss and maceration, appearance consistent with moisture-associated skin damage. Red satellite lesions to edges with appearance consistent with candidiasis. Pressure Ulcer POA: Not a pressure ulcer Dressing procedure/placement/frequency: Continue present plan of care with barrier cream to protect skin and antifungal powder. Please re-consult if further assistance is needed.  Thank-you,  Julien Girt MSN, [place], [place], [place], [place]

## 2013-05-06 NOTE — Progress Notes (Signed)
Dressing changed on right lower extremity, wet to dry, pt stated it was very painful Rickard Rhymes, RN

## 2013-05-06 NOTE — Progress Notes (Signed)
Patient still has confirmed bed at [facility] when medically ready. FL2 on chart for MD signature.  Jeanette Caprice, MSW, [place]

## 2013-05-07 LAB — BASIC METABOLIC PANEL
BUN: 11 mg/dL (ref 6–23)
CO2: 33 mEq/L — ABNORMAL HIGH (ref 19–32)
Calcium: 8.6 mg/dL (ref 8.4–10.5)
Chloride: 98 mEq/L (ref 96–112)
Creatinine, Ser: 0.87 mg/dL (ref 0.50–1.35)
GFR, EST NON AFRICAN AMERICAN: 82 mL/min — AB (ref >90)
Glucose, Bld: 89 mg/dL (ref 70–99)
Potassium: 4 mEq/L (ref 3.7–5.3)
SODIUM: 139 meq/L (ref 137–147)

## 2013-05-07 LAB — CBC
HEMATOCRIT: 30.9 % — AB (ref 39.0–52.0)
Hemoglobin: 10.3 g/dL — ABNORMAL LOW (ref 13.0–17.0)
MCH: 32.6 pg (ref 26.0–34.0)
MCHC: 33.3 g/dL (ref 30.0–36.0)
MCV: 97.8 fL (ref 78.0–100.0)
Platelets: 174 10*3/uL (ref 150–400)
RBC: 3.16 MIL/uL — ABNORMAL LOW (ref 4.22–5.81)
RDW: 14.9 % (ref 11.5–15.5)
WBC: 5.5 10*3/uL (ref 4.0–10.5)

## 2013-05-07 LAB — H.PYLORI ANTIGEN, STOOL

## 2013-05-07 MED ORDER — OXYCODONE-ACETAMINOPHEN 5-325 MG PO TABS
1.0000 | ORAL_TABLET | Freq: Four times a day (QID) | ORAL | Status: DC | PRN
  Administered 2013-05-07 (×2): 2 via ORAL
  Filled 2013-05-07 (×2): qty 2

## 2013-05-07 MED ORDER — PANTOPRAZOLE SODIUM 40 MG PO TBEC: 40.0000 mg | DELAYED_RELEASE_TABLET | Freq: Two times a day (BID) | ORAL | Status: DC

## 2013-05-07 MED ORDER — HYDROMORPHONE HCL 4 MG PO TABS: 4.0000 mg | ORAL_TABLET | ORAL | Status: DC | PRN

## 2013-05-07 MED ORDER — ALPRAZOLAM 1 MG PO TABS: 1.0000 mg | ORAL_TABLET | Freq: Three times a day (TID) | ORAL | Status: DC | PRN

## 2013-05-07 NOTE — Progress Notes (Signed)
[facility]  Subjective  - My right leg hurts all the time.   Objective 107/58 69 98.1 F (36.7 C) (Oral) 15 97%  Intake/Output Summary (Last 24 hours) at 05/07/13 0736 Last data filed at 05/07/13 0500  Gross per 24 hour  Intake    360 ml  Output    300 ml  Net     60 ml     Right leg dressing clean and dry, AFO on for contracture Doppler DP bilaterally.  Grin incisions C/D/I   Assessment/Planning: POD #6 Left to right femoral-femoral bypass with 8 mm Hemashield graft   HGB stable 10.3 K+ 5.6 yesterday renal function is normal and pt is not receiving any potassium.  K+ today is 4.0 WNL  DVT prophylaxis: Lovenox  Pain control is the biggest issue for this patient.  He states the dilaudid help, but the percocet alone does nothing for his pain.   Laurence Slate [facility] 05/07/2013 7:36 AM --  Laboratory Lab Results:  Recent Labs  05/06/13 0810 05/07/13 0555  WBC 8.1 5.5  HGB 10.7* 10.3*  HCT 31.6* 30.9*  PLT 163 174   BMET  Recent Labs  05/06/13 0506  NA 135*  K 5.6*  CL 100  CO2 26  GLUCOSE 89  BUN 10  CREATININE 0.76  CALCIUM 8.2*    COAG Lab Results  Component Value Date   INR 1.13 04/24/2013   INR 1.16 04/23/2013   INR 0.97 01/31/2012   No results found for this basename: PTT

## 2013-05-07 NOTE — Progress Notes (Addendum)
Update: [name] from [facility] they are able to take patient this afternoon and if someone can do paperwork. CSW confirmed with patient's son that he is going to meet patient there at snf to help with paperwork. CSW will arrange for EMS transport for about 2:30pm.   CSW spoke to patient and patient's son [name] over the phone about dc today to [facility]. Patient and patient's son agreeable to plan and social worker will set up EMS transport. CSW awaiting SNF to call back and confirm they are able to take patient today.  Jeanette Caprice, MSW, [place]

## 2013-05-07 NOTE — Discharge Instructions (Signed)
Cellulitis Cellulitis is an infection of the skin and the tissue beneath it. The infected area is usually red and tender. Cellulitis occurs most often in the arms and lower legs.  CAUSES  Cellulitis is caused by bacteria that enter the skin through cracks or cuts in the skin. The most common types of bacteria that cause cellulitis are Staphylococcus and Streptococcus. SYMPTOMS   Redness and warmth.  Swelling.  Tenderness or pain.  Fever. DIAGNOSIS  Your caregiver can usually determine what is wrong based on a physical exam. Blood tests may also be done. TREATMENT  Treatment usually involves taking an antibiotic medicine. HOME CARE INSTRUCTIONS   Take your antibiotics as directed. Finish them even if you start to feel better.  Keep the infected arm or leg elevated to reduce swelling.  Apply a warm cloth to the affected area up to 4 times per day to relieve pain.  Only take over-the-counter or prescription medicines for pain, discomfort, or fever as directed by your caregiver.  Keep all follow-up appointments as directed by your caregiver. SEEK MEDICAL CARE IF:   You notice red streaks coming from the infected area.  Your red area gets larger or turns dark in color.  Your bone or joint underneath the infected area becomes painful after the skin has healed.  Your infection returns in the same area or another area.  You notice a swollen bump in the infected area.  You develop new symptoms. SEEK IMMEDIATE MEDICAL CARE IF:   You have a fever.  You feel very sleepy.  You develop vomiting or diarrhea.  You have a general ill feeling (malaise) with muscle aches and pains. MAKE SURE YOU:   Understand these instructions.  Will watch your condition.  Will get help right away if you are not doing well or get worse. Document Released: 11/10/2004 Document Revised: 08/02/2011 Document Reviewed: 04/18/2011 ExitCare Patient Information 2014 [employer].  

## 2013-05-07 NOTE — Progress Notes (Signed)
Pt informed of impending transfer to SNF, pt stated that he did not want to review discharge medication and just to "send it all over and let them manage it" Rickard Rhymes, RN

## 2013-05-07 NOTE — Progress Notes (Signed)
Clinical Social Worker facilitated patient discharge by contacting the patient, family and facility, [facility]. Patient agreeable to this plan and arranging transport via EMS . CSW will sign off, as social work intervention is no longer needed.  Jeanette Caprice, MSW, [place]

## 2013-05-07 NOTE — Care Management Note (Signed)
    Page 1 of 1   05/07/2013     2:14:39 PM   CARE MANAGEMENT NOTE 05/07/2013  Patient:  [name], [name]   Account Number:  [id number]  Date Initiated:  04/24/2013  Documentation initiated by:  [facility]  Subjective/Objective Assessment:   admitted with cellulitis     Action/Plan:   patient confused, weak. Right leg with iliac occlusion. Went for arteriogram this AM. Patient will likely need shortterm rehab. Lives alone. Has son and daughter for support.Social worker is aware.Family wants [facility].   Anticipated DC Date:  05/07/2013   Anticipated DC Plan:  SKILLED NURSING FACILITY  In-house referral  Clinical Social Worker      DC Planning Services  CM consult      Choice offered to / List presented to:             Status of service:  Completed, signed off Medicare Important Message given?   (If response is "NO", the following Medicare IM given date fields will be blank) Date Medicare IM given:   Date Additional Medicare IM given:    Discharge Disposition:  [place]  Per UR Regulation:  Reviewed for med. necessity/level of care/duration of stay  If discussed at [place] of Stay Meetings, dates discussed:   04/25/2013  04/30/2013  05/02/2013  05/07/2013    Comments:  05/07/13 Montgomery Rothlisberger,RN,BSN [phone] PT DISCHARGING TO [facility] TODAY, PER CSW ARRANGEMENTS.

## 2013-05-07 NOTE — Progress Notes (Signed)
Patient ID: [name], male   DOB: 1934/05/26, 79 y.o.   MRN: [id number] Pain control still an issue. Chronic pain patient. Reports Percocet is given him little relief. Requesting IV narcotics. Will defer to hospitalist for chronic pain control.  Femoral to femoral bypass incisions are healed with an excellent fem-fem bypass pulse. Feet well perfused with complete resolution of preop cellulitis.  Continued local wound care to wounds on right calf. Extreme deconditioning.

## 2013-05-07 NOTE — Progress Notes (Signed)
Clinical Social Work Department CLINICAL SOCIAL WORK PLACEMENT NOTE 05/07/2013  Patient:  [name], [name]  Account Number:  [id number] [place] date:  04/20/2013  Clinical Social Worker:  Berton Mount, Latanya Presser  Date/time:  04/27/2013 10:30 AM  Clinical Social Work is seeking post-discharge placement for this patient at the following level of care:   [place]   (*CSW will update this form in Epic as items are completed)   04/27/2013  Patient/family provided with [place] Department of Clinical Social Work's list of facilities offering this level of care within the geographic area requested by the patient (or if unable, by the patient's family).  04/27/2013  Patient/family informed of their freedom to choose among providers that offer the needed level of care, that participate in Medicare, Medicaid or managed care program needed by the patient, have an available bed and are willing to accept the patient.  04/27/2013  Patient/family informed of [facility]' ownership interest in [facility], as well as of the fact that they are under no obligation to receive care at this facility.  PASARR submitted to EDS on  PASARR number received from [place] on   FL2 transmitted to all facilities in geographic area requested by pt/family on  04/27/2013 FL2 transmitted to all facilities within larger geographic area on   Patient informed that his/her managed care company has contracts with or will negotiate with  certain facilities, including the following:     Patient/family informed of bed offers received:  05/02/2013 Patient chooses bed at [place] Physician recommends and patient chooses bed at    Patient to be transferred to [facility] on  05/07/2013 Patient to be transferred to facility by EMS  The following physician request were entered in Epic:   Additional Comments:  Jeanette Caprice, MSW, [place]

## 2013-05-07 NOTE — Discharge Summary (Signed)
Physician Discharge Summary  [name] GQQ:[id number] DOB: 19-Feb-1934 DOA: 04/20/2013  PCP: Simona Huh, MD  Admit date: 04/20/2013 Discharge date: 05/07/2013  Recommendations for Outpatient Follow-up:  1. Pt will need to follow up with PCP in 2-3 weeks post discharge 2. Please obtain BMP to evaluate electrolytes and kidney function 3. Please also check CBC to evaluate Hg and Hct levels 4. Please note that wet-to-dry dressing changes recommended for the existing decubiti  Discharge Diagnoses:   Chronic total occlusion of artery of the extremities Active Problems:   Chronic total occlusion of artery of the extremities   COPD (chronic obstructive pulmonary disease)   Polyneuropathy in other diseases classified elsewhere   Cellulitis   Sepsis   Dysphagia, unspecified(787.20)   Diarrhea   Cough   GI bleed   Protein-calorie malnutrition, severe   Acute blood loss anemia  Discharge Condition: Stable  Diet recommendation: dysphagia 2 diet  Brief narrative:  Patient is 79 year old male with history of severe peripheral vascular disease with chronic ulcers, known chronic iliac occlusion of the right extremity, presented to [facility] on 04/20/2013 with main concern of progressively worsening right lower extremity swelling and erythema that started one week prior to this admission. In addition patient reported intermittent episodes of dark stools. He had endoscopic ultrasound done 1 year ago which was unrevealing, colonoscopy by Dr. Oletta Lamas at June 2012 which showed small polyps and no other acute findings.   Assessment/Plan:  Acute on chronic blood loss anemia  - Endoscopy done 04/27/2013 -> duodenal ulcers, unclear etiology, ? ischemic ulcers given significant peripheral vascular disease  - No signs of bleeding per patient, continue protonic twice a day  - Hemoglobin remained stable ~ 9 -10 (has required 2 units of transfusion while inpatient 03/16)  PAD  - per vascular,  s/p- Left to right femoral-femoral bypass with 8 mm Hemashield graft  - Postop day #6, patient still in pain, reports medicine is not lasting long  - Continue twice daily normal saline dressing changes  -  continue analgesia by mouth  Cellulitis with open ulcers  - Left leg with calf ulceration  - Slowly improving on vancomycin and ciprofloxacin, d/c ABX (3/22)  - Patient has completed course of antibiotics for cellulitis and does not need continuation of antibiotics upon discharge  - Continue wet-to-dry dressing changes  Hypokalemia  - supplemented  and within normal limits this morning   Chronic dysphagia  - Continue dysphagia 2 diet, pt tolerating well  COPD  - clinically compensated, patient maintaining oxygen saturations at target range  Functional quadriplegia  - Secondary to acute on chronic illnesses outlined above  - Will need SNF once medically stable  Protein calorie malnutrition, severe  - Patient meets criteria in the context of chronic illness and underweight  Rheumatoid arthritis  - on chronic prednisone   Code Status: full  Family Communication: patient   Consultants:  Vascular surgery- Dr Donnetta Hutching  GI Procedures:  1.Arteriogram  2. Left to right femoral-femoral bypass with 8 mm Hemashield graft- 3/18  Antibiotics:  vanc started on 3/7 --> 3/22 cipro 3/11 --> 3/22  Discharge Exam: Filed Vitals:   05/07/13 0751  BP: 111/44  Pulse: 68  Temp: 98 F (36.7 C)  Resp: 16   Filed Vitals:   05/06/13 2031 05/07/13 0530 05/07/13 0751 05/07/13 0809  BP: 101/50 107/58 111/44   Pulse: 72 69 68   Temp: 98.6 F (37 C) 98.1 F (36.7 C) 98 F (36.7 C)  TempSrc: Oral Oral    Resp: 18 15 16    Height:      Weight:  61.3 kg (135 lb 2.3 oz)    SpO2: 96% 97% 96% 96%    General: Pt is alert, follows commands appropriately, not in acute distress Cardiovascular: Regular rate and rhythm, S1/S2 +, no murmurs, no rubs, no gallops Respiratory: Clear to auscultation  bilaterally, no wheezing, no crackles, no rhonchi Abdominal: Soft, non tender, non distended, bowel sounds +, no guarding  Discharge Instructions  Discharge Orders   Future Appointments Provider Department Dept Phone   05/17/2013 3:30 PM Kathrynn Ducking, MD [facility] [phone]   Future Orders Complete By Expires   Diet - low sodium heart healthy  As directed    Increase activity slowly  As directed        Medication List    STOP taking these medications       diazepam 5 MG tablet  Commonly known as:  VALIUM     oxycodone 30 MG immediate release tablet  Commonly known as:  ROXICODONE      TAKE these medications       albuterol (2.5 MG/3ML) 0.083% nebulizer solution  Commonly known as:  PROVENTIL  Take 2.5 mg by nebulization every 4 (four) hours as needed for wheezing or shortness of breath.     ALPRAZolam 1 MG tablet  Commonly known as:  XANAX  Take 1 tablet (1 mg total) by mouth 3 (three) times daily as needed for anxiety or sleep.     Fluticasone-Salmeterol 250-50 MCG/DOSE Aepb  Commonly known as:  ADVAIR  Inhale 1 puff into the lungs every 12 (twelve) hours.     HYDROmorphone 4 MG tablet  Commonly known as:  DILAUDID  Take 1 tablet (4 mg total) by mouth every 4 (four) hours as needed for severe pain.     hydrOXYzine 25 MG tablet  Commonly known as:  ATARAX/VISTARIL  Take 25 mg by mouth 2 (two) times daily as needed for anxiety.     pantoprazole 40 MG tablet  Commonly known as:  PROTONIX  Take 1 tablet (40 mg total) by mouth 2 (two) times daily before a meal.     prednisoLONE 5 MG Tabs tablet  Take 5 mg by mouth daily.     rosuvastatin 20 MG tablet  Commonly known as:  CRESTOR  Take 20 mg by mouth at bedtime.     sertraline 50 MG tablet  Commonly known as:  ZOLOFT  Take 1 tablet (50 mg total) by mouth daily.     silver sulfADIAZINE 1 % cream  Commonly known as:  SILVADENE  Apply topically daily.     tiotropium 18 MCG inhalation  capsule  Commonly known as:  SPIRIVA  Place 1 capsule (18 mcg total) into inhaler and inhale daily.     traZODone 50 MG tablet  Commonly known as:  DESYREL  Take 50 mg by mouth at bedtime.           Follow-up Information   Schedule an appointment as soon as possible for a visit with Simona Huh, MD.   Specialty:  Family Medicine   Contact information:   [address], [place] [address] [phone]       Schedule an appointment as soon as possible for a visit with EARLY, TODD, MD.   Specialty:  Vascular Surgery   Contact information:   [address] [place] [address] [phone]  The results of significant diagnostics from this hospitalization (including imaging, microbiology, ancillary and laboratory) are listed below for reference.     Microbiology: Recent Results (from the past 240 hour(s))  SURGICAL PCR SCREEN     Status: None   Collection Time    05/01/13  1:52 AM      Result Value Ref Range Status   MRSA, PCR NEGATIVE  NEGATIVE Final   Staphylococcus aureus NEGATIVE  NEGATIVE Final   Comment:            The Xpert SA Assay (FDA     approved for NASAL specimens     in patients over 17 years of age),     is one component of     a comprehensive surveillance     program.  Test performance has     been validated by [employer] for patients greater     than or equal to 14 year old.     It is not intended     to diagnose infection nor to     guide or monitor treatment.  H.PYLORI ANTIGEN, STOOL     Status: None   Collection Time    05/06/13  3:29 AM      Result Value Ref Range Status   Specimen Description STOOL   Final   Special Requests NONE   Final   H. pylori ag, stool     Final   Value: NEGATIVE Antimicrobials,proton pump inhibitors and bismuth preparations are known to suppress H.pylori and ingestion of these prior to testing may cause a false negative result. If a negative result is obtained for a patient that has   ingested these      compounds within two weeks prior of performing the H.pylori test, results may be falsely negative and should be repeated with a new specimen two weeks after discontinuing treatment.     Performed at [facility]   Report Status 05/07/2013 FINAL   Final     Labs: Basic Metabolic Panel:  Recent Labs Lab 05/01/13 0536 05/02/13 0400 05/04/13 0645 05/06/13 0506 05/07/13 0555  NA 136* 132* 137 135* 139  K 3.7 4.3 3.7 5.6* 4.0  CL 100 96 100 100 98  CO2 26 26 29 26  33*  GLUCOSE 109* 105* 112* 89 89  BUN 9 7 6 10 11   CREATININE 0.87 0.77 0.88 0.76 0.87  CALCIUM 8.3* 8.2* 7.9* 8.2* 8.6   Liver Function Tests: No results found for this basename: AST, ALT, ALKPHOS, BILITOT, PROT, ALBUMIN,  in the last 168 hours No results found for this basename: LIPASE, AMYLASE,  in the last 168 hours No results found for this basename: AMMONIA,  in the last 168 hours CBC:  Recent Labs Lab 05/01/13 0536 05/02/13 0400 05/04/13 0645 05/06/13 0810 05/07/13 0555  WBC 8.4 9.8 6.5 8.1 5.5  HGB 10.3* 10.6* 9.4* 10.7* 10.3*  HCT 29.8* 31.1* 28.0* 31.6* 30.9*  MCV 92.8 94.0 95.9 96.3 97.8  PLT 182 155 160 163 174   Cardiac Enzymes: No results found for this basename: CKTOTAL, CKMB, CKMBINDEX, TROPONINI,  in the last 168 hours BNP: BNP (last 3 results) No results found for this basename: PROBNP,  in the last 8760 hours CBG: No results found for this basename: GLUCAP,  in the last 168 hours   SIGNED: Time coordinating discharge: Over 30 minutes  Faye Ramsay, MD  [facility] 05/07/2013, 11:21 AM Pager [phone]  If 7PM-7AM, please contact  night-coverage [email or web address] Password [email or web address]

## 2013-05-07 NOTE — Progress Notes (Signed)
Report called to [place] at [facility], Karren Burly, RN

## 2013-05-08 ENCOUNTER — Other Ambulatory Visit: Payer: Self-pay | Admitting: *Deleted

## 2013-05-08 MED ORDER — HYDROMORPHONE HCL 4 MG PO TABS: ORAL_TABLET | ORAL | Status: DC

## 2013-05-08 MED ORDER — ALPRAZOLAM 1 MG PO TABS: ORAL_TABLET | ORAL | Status: DC

## 2013-05-08 NOTE — Telephone Encounter (Signed)
[name]

## 2013-05-10 ENCOUNTER — Emergency Department (HOSPITAL_COMMUNITY)
Admission: EM | Admit: 2013-05-10 | Discharge: 2013-05-10 | Disposition: A | Discharge status: Home / Self Care | Payer: Medicare Other | Attending: Emergency Medicine | Admitting: Emergency Medicine

## 2013-05-10 ENCOUNTER — Encounter (HOSPITAL_COMMUNITY): Payer: Self-pay | Admitting: Emergency Medicine

## 2013-05-10 ENCOUNTER — Emergency Department (HOSPITAL_COMMUNITY): Payer: Medicare Other

## 2013-05-10 DIAGNOSIS — L89109 Pressure ulcer of unspecified part of back, unspecified stage: Secondary | ICD-10-CM | POA: Insufficient documentation

## 2013-05-10 DIAGNOSIS — K219 Gastro-esophageal reflux disease without esophagitis: Secondary | ICD-10-CM | POA: Insufficient documentation

## 2013-05-10 DIAGNOSIS — Z860100 Personal history of colon polyps, unspecified: Secondary | ICD-10-CM | POA: Insufficient documentation

## 2013-05-10 DIAGNOSIS — F17200 Nicotine dependence, unspecified, uncomplicated: Secondary | ICD-10-CM | POA: Insufficient documentation

## 2013-05-10 DIAGNOSIS — Z853 Personal history of malignant neoplasm of breast: Secondary | ICD-10-CM | POA: Insufficient documentation

## 2013-05-10 DIAGNOSIS — IMO0002 Reserved for concepts with insufficient information to code with codable children: Secondary | ICD-10-CM | POA: Insufficient documentation

## 2013-05-10 DIAGNOSIS — Z8701 Personal history of pneumonia (recurrent): Secondary | ICD-10-CM | POA: Insufficient documentation

## 2013-05-10 DIAGNOSIS — Z9889 Other specified postprocedural states: Secondary | ICD-10-CM | POA: Insufficient documentation

## 2013-05-10 DIAGNOSIS — E785 Hyperlipidemia, unspecified: Secondary | ICD-10-CM | POA: Insufficient documentation

## 2013-05-10 DIAGNOSIS — Z862 Personal history of diseases of the blood and blood-forming organs and certain disorders involving the immune mechanism: Secondary | ICD-10-CM | POA: Insufficient documentation

## 2013-05-10 DIAGNOSIS — J438 Other emphysema: Secondary | ICD-10-CM | POA: Insufficient documentation

## 2013-05-10 DIAGNOSIS — Z8739 Personal history of other diseases of the musculoskeletal system and connective tissue: Secondary | ICD-10-CM | POA: Insufficient documentation

## 2013-05-10 DIAGNOSIS — Z87448 Personal history of other diseases of urinary system: Secondary | ICD-10-CM | POA: Insufficient documentation

## 2013-05-10 DIAGNOSIS — L089 Local infection of the skin and subcutaneous tissue, unspecified: Secondary | ICD-10-CM

## 2013-05-10 DIAGNOSIS — M79609 Pain in unspecified limb: Secondary | ICD-10-CM | POA: Insufficient documentation

## 2013-05-10 DIAGNOSIS — I10 Essential (primary) hypertension: Secondary | ICD-10-CM | POA: Insufficient documentation

## 2013-05-10 DIAGNOSIS — G8929 Other chronic pain: Secondary | ICD-10-CM | POA: Insufficient documentation

## 2013-05-10 DIAGNOSIS — F411 Generalized anxiety disorder: Secondary | ICD-10-CM | POA: Insufficient documentation

## 2013-05-10 DIAGNOSIS — Z872 Personal history of diseases of the skin and subcutaneous tissue: Secondary | ICD-10-CM | POA: Insufficient documentation

## 2013-05-10 DIAGNOSIS — F3289 Other specified depressive episodes: Secondary | ICD-10-CM | POA: Insufficient documentation

## 2013-05-10 DIAGNOSIS — Z8669 Personal history of other diseases of the nervous system and sense organs: Secondary | ICD-10-CM | POA: Insufficient documentation

## 2013-05-10 DIAGNOSIS — Z880 Allergy status to penicillin: Secondary | ICD-10-CM | POA: Insufficient documentation

## 2013-05-10 DIAGNOSIS — F329 Major depressive disorder, single episode, unspecified: Secondary | ICD-10-CM | POA: Insufficient documentation

## 2013-05-10 DIAGNOSIS — Z79899 Other long term (current) drug therapy: Secondary | ICD-10-CM | POA: Insufficient documentation

## 2013-05-10 DIAGNOSIS — Z85828 Personal history of other malignant neoplasm of skin: Secondary | ICD-10-CM | POA: Insufficient documentation

## 2013-05-10 DIAGNOSIS — Z8546 Personal history of malignant neoplasm of prostate: Secondary | ICD-10-CM | POA: Insufficient documentation

## 2013-05-10 DIAGNOSIS — T148XXA Other injury of unspecified body region, initial encounter: Secondary | ICD-10-CM

## 2013-05-10 LAB — CBC
HCT: 32.7 % — ABNORMAL LOW (ref 39.0–52.0)
Hemoglobin: 11 g/dL — ABNORMAL LOW (ref 13.0–17.0)
MCH: 32.3 pg (ref 26.0–34.0)
MCHC: 33.6 g/dL (ref 30.0–36.0)
MCV: 95.9 fL (ref 78.0–100.0)
PLATELETS: ADEQUATE 10*3/uL (ref 150–400)
RBC: 3.41 MIL/uL — ABNORMAL LOW (ref 4.22–5.81)
RDW: 14.7 % (ref 11.5–15.5)
WBC: 8 10*3/uL (ref 4.0–10.5)

## 2013-05-10 LAB — BASIC METABOLIC PANEL
BUN: 12 mg/dL (ref 6–23)
CHLORIDE: 97 meq/L (ref 96–112)
CO2: 29 mEq/L (ref 19–32)
CREATININE: 0.91 mg/dL (ref 0.50–1.35)
Calcium: 8.9 mg/dL (ref 8.4–10.5)
GFR, EST NON AFRICAN AMERICAN: 80 mL/min — AB (ref >90)
Glucose, Bld: 112 mg/dL — ABNORMAL HIGH (ref 70–99)
Potassium: 4.9 mEq/L (ref 3.7–5.3)
Sodium: 136 mEq/L — ABNORMAL LOW (ref 137–147)

## 2013-05-10 MED ORDER — SULFAMETHOXAZOLE-TRIMETHOPRIM 800-160 MG PO TABS: 1.0000 | ORAL_TABLET | Freq: Two times a day (BID) | ORAL | Status: AC

## 2013-05-10 MED ORDER — HYDROMORPHONE HCL PF 1 MG/ML IJ SOLN
1.0000 mg | Freq: Once | INTRAMUSCULAR | Status: AC
  Administered 2013-05-10: 1 mg via INTRAMUSCULAR
  Filled 2013-05-10: qty 1

## 2013-05-10 MED ORDER — IOHEXOL 300 MG/ML  SOLN
100.0000 mL | Freq: Once | INTRAMUSCULAR | Status: AC | PRN
  Administered 2013-05-10: 100 mL via INTRAVENOUS

## 2013-05-10 NOTE — ED Notes (Signed)
PER EMS  -  Pt from [facility] with c/o sacral wound, d/c'd from [facility] on 3/24, seen by wound MD today who wanted pt evaluated in ER.  Pt c/o pain to area.  Pt denies other complaints. A+Ox4.

## 2013-05-10 NOTE — ED Provider Notes (Signed)
CSN: [id number]     Arrival date & time 05/10/13  1116 History   First MD Initiated Contact with Patient 05/10/13 1141     Chief Complaint  Patient presents with  . Wound Infection     (Consider location/radiation/quality/duration/timing/severity/associated sxs/prior Treatment) HPI  Patient discharged from the hospital 3 days ago to rehab facility for severe peripheral artery disease and chronic sacral decubitus ulcers. Femoral to femoral bypass by Dr Donnetta Hutching 05/01/13.  Pt states he was sent in today by the "wound doctors" for concern about his sacral ulcer, stating "they think it is too deep and is connecting up there."  Denies any pain in the area, states he "can't feel it." Reports chronic pain in his right leg (right calf wound) that is worse than usual because the PO dilaudid at the SNF is not as effective as the IV dilaudid he was receiving in the hospital.  Also notes the SNF staff could not find a pulse in his right foot this morning.  Pt denies fevers, feeling ill.    Past Medical History  Diagnosis Date  . Hyperlipidemia   . History of colon polyps   . Spinal stenosis   . Anxiety   . BPH (benign prostatic hypertrophy)   . ED (erectile dysfunction)   . Osteoporosis   . Hypertension   . Chest pain     "I've had it" (01/31/2012)   . Peripheral vascular disease     "right leg is 100% blocked; left leg has stent, still ~ 75% blocked" (01/31/2012)  . Peripheral neuropathy     "bad" (01/31/2012)  . COPD (chronic obstructive pulmonary disease)   . Emphysema   . BRBPR (bright red blood per rectum)     "don't know what it's from; had some this week" (01/31/2012)  . Chronic lower back pain   . Pernicious anemia   . GERD (gastroesophageal reflux disease)   . Polyneuropathy in other diseases classified elsewhere 11/06/2012  . Prostate cancer 2004    "had 40 treatments of radiation" (01/31/2012)  . Breast cancer in male     "left" (01/31/2012)  . Basal cell carcinoma of nasal tip    . Pneumonia 1960's    "double"   . Cervical spondylosis   . Rheumatoid arthritis(714.0)   . Arthritis     "all over my body" (04/25/2013)  . DDD (degenerative disc disease), lumbosacral     "S1; L5" (01/31/2012)  . Lumbosacral spondylosis without myelopathy 11/06/2012  . Cellulitis of right leg   . Depression     "won't take RX though" (04/25/2013)  . Falls frequently    Past Surgical History  Procedure Laterality Date  . Basal cell carcinoma excision      Nose x 3  . Cholecystectomy  ?1990's  . Wrist fusion Right     "3 OR's; it's fused" (01/31/2012)  . Excisional hemorrhoidectomy  1960's  . Knee surgery Right      6 surgeries; went in for simple cartilage OR; ended up w/fused knee" (01/31/2012)  . Iliac artery stent  12-07-09    Stent done by Dr. Gwenlyn Found  . Total hip arthroplasty  08/16/2011    Procedure: TOTAL HIP ARTHROPLASTY ANTERIOR APPROACH;  Surgeon: Mcarthur Rossetti, MD;  Location: [facility];  Service: Orthopedics;  Laterality: Right;  Right total hip replacement  . Cervical disc surgery      "7 total; ended w/a fusion" (01/31/2012)  . Esophagogastroduodenoscopy  02/22/2012    Procedure: ESOPHAGOGASTRODUODENOSCOPY (EGD);  Surgeon:  Arta Silence, MD;  Location: [facility];  Service: Endoscopy;  Laterality: N/A;  . Eus  02/22/2012    Procedure: UPPER ENDOSCOPIC ULTRASOUND (EUS) RADIAL;  Surgeon: Arta Silence, MD;  Location: [facility] ENDOSCOPY;  Service: Endoscopy;  Laterality: N/A;  . Hernia repair    . Transurethral resection of prostate    . Aortogram  04/25/2013  . Esophagogastroduodenoscopy Left 04/27/2013    Procedure: ESOPHAGOGASTRODUODENOSCOPY (EGD);  Surgeon: Arta Silence, MD;  Location: [facility] ENDOSCOPY;  Service: Endoscopy;  Laterality: Left;  . Femoral-femoral bypass graft Bilateral 05/01/2013    Procedure: BYPASS GRAFT FEMORAL-FEMORAL ARTERY/ LEFT - RIGHT;  Surgeon: Rosetta Posner, MD;  Location: [facility] OR;  Service: Vascular;  Laterality: Bilateral;   Family History   Problem Relation Age of Onset  . Hypertension Mother   . Cancer Mother     colon cancer  . Stroke Mother   . Aneurysm Father     abdominal aortic  . Heart disease Brother    History  Substance Use Topics  . Smoking status: Current Every Day Smoker -- 2.00 packs/day for 63 years    Types: Cigarettes    Last Attempt to Quit: 01/31/2012  . Smokeless tobacco: Never Used  . Alcohol Use: Yes     Comment: 04/25/2013 "used to be a heavy drinker; stopped drinking in 1975"    Review of Systems  Constitutional: Negative for fever.  Respiratory: Negative for shortness of breath.   Cardiovascular: Negative for chest pain and leg swelling.  Skin: Positive for wound.  All other systems reviewed and are negative.      Allergies  Lipitor and Penicillins  Home Medications   Current Outpatient Rx  Name  Route  Sig  Dispense  Refill  . albuterol (PROVENTIL) (2.5 MG/3ML) 0.083% nebulizer solution   Nebulization   Take 2.5 mg by nebulization every 4 (four) hours as needed for wheezing or shortness of breath.         . ALPRAZolam (XANAX) 1 MG tablet      Take one tablet by mouth three times daily as needed for anxiety or sleep   90 tablet   5   . bisacodyl (DULCOLAX) 10 MG suppository   Rectal   Place 10 mg rectally as needed for moderate constipation.         . Fluticasone-Salmeterol (ADVAIR) 250-50 MCG/DOSE AEPB   Inhalation   Inhale 1 puff into the lungs every 12 (twelve) hours.   2 each   0   . HYDROmorphone (DILAUDID) 4 MG tablet      Take one tablet by mouth every four hours as needed for severe pain   180 tablet   0   . hydrOXYzine (ATARAX/VISTARIL) 25 MG tablet   Oral   Take 25 mg by mouth 2 (two) times daily as needed for anxiety.         . magnesium hydroxide (MILK OF MAGNESIA) 400 MG/5ML suspension   Oral   Take 30 mLs by mouth daily as needed for mild constipation.         . pantoprazole (PROTONIX) 40 MG tablet   Oral   Take 1 tablet (40 mg  total) by mouth 2 (two) times daily before a meal.   60 tablet   1   . prednisoLONE 5 MG TABS tablet   Oral   Take 5 mg by mouth daily.         . rosuvastatin (CRESTOR) 20 MG tablet   Oral   Take  20 mg by mouth at bedtime.          . sertraline (ZOLOFT) 50 MG tablet   Oral   Take 1 tablet (50 mg total) by mouth daily.   30 tablet   0   . Sodium Phosphates (RA SALINE ENEMA RE)   Rectal   Place 1 enema rectally daily as needed (severe constipation).         Marland Kitchen tiotropium (SPIRIVA) 18 MCG inhalation capsule   Inhalation   Place 1 capsule (18 mcg total) into inhaler and inhale daily.   20 capsule   0   . traZODone (DESYREL) 50 MG tablet   Oral   Take 50 mg by mouth at bedtime.          BP 125/65  Pulse 79  Temp(Src) 99 F (37.2 C) (Oral)  Resp 16  SpO2 93% Physical Exam  Nursing note and vitals reviewed. Constitutional: He appears well-developed and well-nourished. No distress.  HENT:  Head: Normocephalic and atraumatic.  Neck: Neck supple.  Cardiovascular: Normal rate and regular rhythm.   Pulmonary/Chest: Effort normal and breath sounds normal. No respiratory distress. He has no wheezes. He has no rales.  Abdominal: Soft. He exhibits no distension and no mass. There is tenderness. There is no rebound and no guarding.  Mild diffuse lower abdominal tenderness  Neurological: He is alert. He exhibits normal muscle tone.  Skin: He is not diaphoretic.  Right posterior calf wound with bandage in place.  Wound is without erythema, edema, warmth or discharge.  It is tender.    Buttocks diffusely erythematous and warm, skin edematous, packing is on wound dressing, thin yellow discharge dripping from the perianal area.      ED Course  Procedures (including critical care time) Labs Review Labs Reviewed  BASIC METABOLIC PANEL - Abnormal; Notable for the following:    Sodium 136 (*)    Glucose, Bld 112 (*)    GFR calc non Af Amer 80 (*)    All other components  within normal limits  CBC - Abnormal; Notable for the following:    RBC 3.41 (*)    Hemoglobin 11.0 (*)    HCT 32.7 (*)    All other components within normal limits  CULTURE, ROUTINE-ABSCESS   Imaging Review Ct Pelvis W Contrast  05/10/2013   CLINICAL DATA:  Decubitus ulcer.  Evaluate worsening infection.  EXAM: CT PELVIS WITH CONTRAST  TECHNIQUE: Multidetector CT imaging of the pelvis was performed using the standard protocol following the bolus administration of intravenous contrast.  CONTRAST:  136mL OMNIPAQUE IOHEXOL 300 MG/ML  SOLN  COMPARISON:  CT ABD/PELVIS W CM dated 01/26/2012  FINDINGS: Femoral femoral bypass and right total hip arthroplasty noted.  The femoral femoral bypass shows tiny locules of gas and fluid around the graft, compatible with recent placement. This can be associated with infection but does not necessarily indicate infection.  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 Artifact is present over the anatomic pelvis from the right total hip arthroplasty. Mural thickening of the urinary bladder is present for the degree of distension and there is trabeculation of may relate to chronic bladder outflow obstruction prostate brachytherapy seeds are present.  No acute vascular abnormality is present. Healed left obturator ring fracture. Partially visualized L5-S1 predominant spondylosis.  There is no CT evidence of osteomyelitis. No large soft tissue defects are present to suggest deep decubitus ulceration. Anasarca changes are present in soft tissues with infiltration of the  subcutaneous fat bilaterally around the hips.  IMPRESSION: No decubitus ulceration is identified by CT. No soft tissue abscess or osteomyelitis. Femoral femoral bypass graft with small amount of surrounding fluid and tiny amounts of gas around the graft, not unexpected following placement.   Electronically Signed   By: Dereck Ligas M.D.   On: 05/10/2013 13:36     EKG Interpretation None       Filed Vitals:   05/10/13 1449  BP: 137/65  Pulse: 77  Temp:   Resp: 16    12:22 PM Discussed pt with Dr Vanita Panda.   MDM   Final diagnoses:  Wound infection    Pt with chronic wounds from vascular disease presents with concern from wound care team at rehab facility of sacral wound tracking into deeper structures or need for surgical intervention.  Afebrile, nontoxic patient with right calf and right forearm wounds that have no e/o infection.  Sacral wound does have thin yellow discharge, sent for culture (collected by nurse).  CT pelvis is negative for abscess or deeper infection, normal postoperative changes.  Pulse is present in right foot, doppler performed by nurse.  Pt started on Bactrim for sacral wound, culture pending.  D/C back to rehab facility to continue wound care and to follow with Dr Early as previously planned.  Discussed result, findings, treatment, and follow up  with patient.  Pt given return precautions.  Pt verbalizes understanding and agrees with plan.        Clayton Bibles, PA-C 05/10/13 1455

## 2013-05-10 NOTE — ED Notes (Signed)
Per EDPA pt reports facility was unable to find a pulse in the RLE today.  +pedal pulse on dopplar noted to RLE at this time.  EDPA made aware.

## 2013-05-10 NOTE — ED Provider Notes (Signed)
  This was a shared visit with a mid-level provided (NP or PA).  Throughout the patient's course I was available for consultation/collaboration.  I saw the ECG (if appropriate), relevant labs and studies - I agree with the interpretation.  On my exam the patient was in no distress.  We discussed his wound at length. Patient's CT scan was largely reassuring. Treatment plan initiated, cultures sent.      Carmin Muskrat, MD 05/10/13 1544

## 2013-05-10 NOTE — ED Notes (Signed)
[facility]AR here to provide safe transport to facility.  NAD upon leaving dept.

## 2013-05-10 NOTE — ED Notes (Addendum)
Initial Contact - A+Ox4.  Pt resting on stretcher, c/o constant burning 10/10 pain to RLE, pt denies other complaints, pt reports he was unaware of any wound on his sacrum.  Pt denies CP/SOB, maintained on 2L 02 via [place] which he is on at baseline with sats in the low-mid 90s.  Speaking full/clear sentences, rr even/un-lab, lsctab, dim.  Pt denies cough, fevers/chills.  Pt denies n/v/d/c.  +bsx4 quads.  abd s/nt/nd.  Poor skin integrity, numerous bruises noted, dependent swelling to RUE.  Pt reports wound to R calf, dressing in place, assessment deferred at this time unit EDP available.  Pt also with pencil-eraser sized wound to glutteal fold with moderate amount yellow drainage coming from wound.  Pt arrived with wound packing in place.  Pt also with skin tear to R forearm.  Pt also with healing surgical incisions to bilat groin, steri-strips remain present, no s/sx infection at site.  Pt placed to cardiac/02 monitor, NAD, awaiting EDP eval.

## 2013-05-10 NOTE — Discharge Instructions (Signed)
Read the information below.  Use the prescribed medication as directed.  Please discuss all new medications with your pharmacist.  You may return to the Emergency Department at any time for worsening condition or any new symptoms that concern you.  If you develop increased redness, swelling, increased pus draining from the wound, uncontrolled pain, or fevers greater than 100.4, return to the ER immediately for a recheck.     Wound Infection A wound infection happens when a type of germ (bacteria) starts growing in the wound. In some cases, this can cause the wound to break open. If cared for properly, the infected wound will heal from the inside to the outside. Wound infections need treatment. CAUSES An infection is caused by bacteria growing in the wound.  SYMPTOMS   Increase in redness, swelling, or pain at the wound site.  Increase in drainage at the wound site.  Wound or bandage (dressing) starts to smell bad.  Fever.  Feeling tired or fatigued.  Pus draining from the wound. TREATMENT  You caregiver will prescribe antibiotic medicine. The wound infection should improve within 24 to 48 hours. Any redness around the wound should stop spreading and the wound should be less painful.  HOME CARE INSTRUCTIONS   Only take over-the-counter or prescription medicines for pain, discomfort, or fever as directed by your caregiver.  Take your antibiotics as directed. Finish them even if you start to feel better.  Gently wash the area with mild soap and water 2 times a day, or as directed. Rinse off the soap. Pat the area dry with a clean towel. Do not rub the wound. This may cause bleeding.  Follow your caregiver's instructions for how often you need to change the dressing.  Apply ointment and a dressing to the wound as directed.  If the dressing sticks, moisten it with soapy water and gently remove it.  Change the bandage right away if it becomes wet, dirty, or develops a bad smell.  Take  showers. Do not take tub baths, swim, or do anything that may soak the wound until it is healed.  Avoid exercises that make you sweat heavily.  Use anti-itch medicine as directed by your caregiver. The wound may itch when it is healing. Do not pick or scratch at the wound.  Follow up with your caregiver to get your wound rechecked as directed. SEEK MEDICAL CARE IF:  You have an increase in swelling, pain, or redness around the wound.  You have an increase in the amount of pus coming from the wound.  There is a bad smell coming from the wound.  More of the wound breaks open.  You have a fever. MAKE SURE YOU:   Understand these instructions.  Will watch your condition.  Will get help right away if you are not doing well or get worse. Document Released: 10/30/2002 Document Revised: 04/25/2011 Document Reviewed: 06/06/2010 [facility] Patient Information 2014 [place], [place].

## 2013-05-10 NOTE — ED Notes (Signed)
Bed: VD47 Expected date:  Expected time:  Means of arrival:  Comments: EMS - Wound, SNF

## 2013-05-10 NOTE — ED Notes (Signed)
Pt resting on stretcher, awaiting [facility] for transport.  NAD.

## 2013-05-10 NOTE — ED Notes (Signed)
Verbal report provided to [name], Therapist, sports at [facility].  [facility] called to provide transport.

## 2013-05-13 ENCOUNTER — Telehealth: Payer: Self-pay | Admitting: Vascular Surgery

## 2013-05-13 LAB — CULTURE, ROUTINE-ABSCESS

## 2013-05-13 NOTE — Telephone Encounter (Addendum)
Message copied by Gena Fray on Mon May 13, 2013  4:43 PM ------      Message from: Peter Minium K      Created: Mon May 13, 2013  1:58 PM      Regarding: RE: Follow Up       i would make one anytime soon, he is at [facility] according to the case workers note. We didn't do discharge, Hospitalist did it. But Arbie Cookey and I think asap.             ----- Message -----         From: Gena Fray         Sent: 05/13/2013  11:40 AM           To: Mena Goes, CMA      Subject: Follow Up                                                Hi Zigmund Daniel            On Paulette Blanch, he had surgery 03/18. I have not received anything on him by the way of staff messages. Do you happen to know if he needs follow up?            Thanks,      Hinton Dyer       ------  05/13/13: lm for pt with appt date and time, dpm

## 2013-05-14 ENCOUNTER — Non-Acute Institutional Stay (SKILLED_NURSING_FACILITY): Payer: Medicare Other | Admitting: Internal Medicine

## 2013-05-14 ENCOUNTER — Encounter: Payer: Self-pay | Admitting: Internal Medicine

## 2013-05-14 DIAGNOSIS — K612 Anorectal abscess: Secondary | ICD-10-CM

## 2013-05-14 DIAGNOSIS — R1310 Dysphagia, unspecified: Secondary | ICD-10-CM

## 2013-05-14 DIAGNOSIS — K611 Rectal abscess: Secondary | ICD-10-CM

## 2013-05-14 DIAGNOSIS — J4489 Other specified chronic obstructive pulmonary disease: Secondary | ICD-10-CM

## 2013-05-14 DIAGNOSIS — M069 Rheumatoid arthritis, unspecified: Secondary | ICD-10-CM

## 2013-05-14 DIAGNOSIS — K922 Gastrointestinal hemorrhage, unspecified: Secondary | ICD-10-CM

## 2013-05-14 DIAGNOSIS — J449 Chronic obstructive pulmonary disease, unspecified: Secondary | ICD-10-CM

## 2013-05-14 DIAGNOSIS — M47812 Spondylosis without myelopathy or radiculopathy, cervical region: Secondary | ICD-10-CM

## 2013-05-14 DIAGNOSIS — I7092 Chronic total occlusion of artery of the extremities: Secondary | ICD-10-CM

## 2013-05-14 DIAGNOSIS — K269 Duodenal ulcer, unspecified as acute or chronic, without hemorrhage or perforation: Secondary | ICD-10-CM

## 2013-05-14 DIAGNOSIS — E43 Unspecified severe protein-calorie malnutrition: Secondary | ICD-10-CM

## 2013-05-14 DIAGNOSIS — I739 Peripheral vascular disease, unspecified: Secondary | ICD-10-CM

## 2013-05-14 NOTE — Progress Notes (Signed)
Patient ID: [name], male   DOB: [birth year], 79 y.o.   MRN: [id number]  Provider:  Rexene Edison. Mariea Clonts, D.O., C.M.D.  Location:  [facility]  PCP: Simona Huh, MD  Code Status: full code  Allergies  Allergen Reactions  . Lipitor [Atorvastatin Calcium] Other (See Comments)    Myalgias "don't remember how bad" (01/31/2012)  . Penicillins Other (See Comments)    Passed out    Chief Complaint  Patient presents with  . Hospitalization Follow-up    new admission for rehab s/p hospitalization 3/7-3/24/15 with right lower extremity redness, swelling AND dark stools    HPI: 79 y.o. 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  He was found to have a GI bleed felt to be due to ischemic duodenal ulcers (EGD 3/14).  He received a 2 unit prbc transfusion and h/h was then stable att 9-10.  He underwent a right femoral-femoral bypass, as well, by Dr. Donnetta Hutching.  He developed LEFT LE cellulitis with a calf ulcer and was placed on vanc and cipro thru 3/22.    On 05/10/13, he went out to the ED with concerns about a perirectal abscess.  Notes there talk about cellulitis of the left leg, but pt describes nothing about this.  Cbc, bmp were unremarkable.  Wound culture was obtained and pending.  CT that was done showed no osteomyelitis, but did reveal a small amount of fluid and gas surrounding the graft.  He needs to f/u with vascular surgery.  He is now on Bactrim through 05/17/13.  He is being followed by Dr. Jake Michaelis from wound care.  He notes severe pain in his bottom.  ROS: Review of Systems  Constitutional: Positive for weight loss and malaise/fatigue. Negative for fever,  chills and diaphoresis.  HENT: Negative for congestion.   Eyes: Negative for blurred vision.  Respiratory: Positive for shortness of breath and wheezing.   Cardiovascular: Negative for chest pain, palpitations and leg swelling.  Gastrointestinal: Negative for abdominal pain, constipation, blood in stool and melena.  Genitourinary: Negative for dysuria, urgency and frequency.  Musculoskeletal: Positive for myalgias and neck pain. Negative for falls.  Skin: Negative for itching and rash.  Neurological: Positive for dizziness, tingling, sensory change and weakness. Negative for tremors, speech change, focal weakness, loss of consciousness and headaches.       Notes right foot continually feels like it is in ice cold water and also burns and tingles at the same time  Endo/Heme/Allergies: Bruises/bleeds easily.  Psychiatric/Behavioral: Positive for depression. Negative for suicidal ideas and memory loss. The patient has insomnia.      Past Medical History  Diagnosis Date  . Hyperlipidemia   . History of colon polyps   . Spinal stenosis   . Anxiety   . BPH (benign prostatic hypertrophy)   . ED (erectile dysfunction)   . Osteoporosis   . Hypertension   . Chest pain     "I've had it" (01/31/2012)   . Peripheral vascular disease     "right leg is 100% blocked; left leg has stent, still ~ 75% blocked" (01/31/2012)  . Peripheral neuropathy     "bad" (01/31/2012)  . COPD (chronic obstructive pulmonary disease)   .  Emphysema   . BRBPR (bright red blood per rectum)     "don't know what it's from; had some this week" (01/31/2012)  . Chronic lower back pain   . Pernicious anemia   . GERD (gastroesophageal reflux disease)   . Polyneuropathy in other diseases classified elsewhere 11/06/2012  . Prostate cancer 2004    "had 40 treatments of radiation" (01/31/2012)  . Breast cancer in male     "left" (01/31/2012)  . Basal cell carcinoma of nasal tip   . Pneumonia 1960's    "double"   .  Cervical spondylosis   . Rheumatoid arthritis(714.0)   . Arthritis     "all over my body" (04/25/2013)  . DDD (degenerative disc disease), lumbosacral     "S1; L5" (01/31/2012)  . Lumbosacral spondylosis without myelopathy 11/06/2012  . Cellulitis of right leg   . Depression     "won't take RX though" (04/25/2013)  . Falls frequently    Past Surgical History  Procedure Laterality Date  . Basal cell carcinoma excision      Nose x 3  . Cholecystectomy  ?1990's  . Wrist fusion Right     "3 OR's; it's fused" (01/31/2012)  . Excisional hemorrhoidectomy  1960's  . Knee surgery Right      6 surgeries; went in for simple cartilage OR; ended up w/fused knee" (01/31/2012)  . Iliac artery stent  12-07-09    Stent done by Dr. Gwenlyn Found  . Total hip arthroplasty  08/16/2011    Procedure: TOTAL HIP ARTHROPLASTY ANTERIOR APPROACH;  Surgeon: Mcarthur Rossetti, MD;  Location: [facility];  Service: Orthopedics;  Laterality: Right;  Right total hip replacement  . Cervical disc surgery      "7 total; ended w/a fusion" (01/31/2012)  . Esophagogastroduodenoscopy  02/22/2012    Procedure: ESOPHAGOGASTRODUODENOSCOPY (EGD);  Surgeon: Arta Silence, MD;  Location: [facility];  Service: Endoscopy;  Laterality: N/A;  . Eus  02/22/2012    Procedure: UPPER ENDOSCOPIC ULTRASOUND (EUS) RADIAL;  Surgeon: Arta Silence, MD;  Location: [facility];  Service: Endoscopy;  Laterality: N/A;  . Hernia repair    . Transurethral resection of prostate    . Aortogram  04/25/2013  . Esophagogastroduodenoscopy Left 04/27/2013    Procedure: ESOPHAGOGASTRODUODENOSCOPY (EGD);  Surgeon: Arta Silence, MD;  Location: [facility];  Service: Endoscopy;  Laterality: Left;  . Femoral-femoral bypass graft Bilateral 05/01/2013    Procedure: BYPASS GRAFT FEMORAL-FEMORAL ARTERY/ LEFT - RIGHT;  Surgeon: Rosetta Posner, MD;  Location: [facility] OR;  Service: Vascular;  Laterality: Bilateral;   Social History:   reports that he has been smoking  Cigarettes.  He has a 126 pack-year smoking history. He has never used smokeless tobacco. He reports that he drinks alcohol. He reports that he does not use illicit drugs.  Family History  Problem Relation Age of Onset  . Hypertension Mother   . Cancer Mother     colon cancer  . Stroke Mother   . Aneurysm Father     abdominal aortic  . Heart disease Brother     Medications: Patient's Medications  New Prescriptions   No medications on file  Previous Medications   ALBUTEROL (PROVENTIL) (2.5 MG/3ML) 0.083% NEBULIZER SOLUTION    Take 2.5 mg by nebulization every 4 (four) hours as needed for wheezing or shortness of breath.   ALPRAZOLAM (XANAX) 1 MG TABLET    Take one tablet by mouth three times daily as needed for anxiety or sleep   BISACODYL (DULCOLAX)  10 MG SUPPOSITORY    Place 10 mg rectally as needed for moderate constipation.   FLUTICASONE-SALMETEROL (ADVAIR) 250-50 MCG/DOSE AEPB    Inhale 1 puff into the lungs every 12 (twelve) hours.   HYDROMORPHONE (DILAUDID) 4 MG TABLET    Take one tablet by mouth every four hours as needed for severe pain   HYDROXYZINE (ATARAX/VISTARIL) 25 MG TABLET    Take 25 mg by mouth 2 (two) times daily as needed for anxiety.   MAGNESIUM HYDROXIDE (MILK OF MAGNESIA) 400 MG/5ML SUSPENSION    Take 30 mLs by mouth daily as needed for mild constipation.   PANTOPRAZOLE (PROTONIX) 40 MG TABLET    Take 1 tablet (40 mg total) by mouth 2 (two) times daily before a meal.   PREDNISOLONE 5 MG TABS TABLET    Take 5 mg by mouth daily.   ROSUVASTATIN (CRESTOR) 20 MG TABLET    Take 20 mg by mouth at bedtime.    SERTRALINE (ZOLOFT) 50 MG TABLET    Take 1 tablet (50 mg total) by mouth daily.   SODIUM PHOSPHATES (RA SALINE ENEMA RE)    Place 1 enema rectally daily as needed (severe constipation).   SULFAMETHOXAZOLE-TRIMETHOPRIM (BACTRIM DS,SEPTRA DS) 800-160 MG PER TABLET    Take 1 tablet by mouth 2 (two) times daily.   TIOTROPIUM (SPIRIVA) 18 MCG INHALATION CAPSULE    Place 1  capsule (18 mcg total) into inhaler and inhale daily.   TRAZODONE (DESYREL) 50 MG TABLET    Take 50 mg by mouth at bedtime.  Modified Medications   No medications on file  Discontinued Medications   No medications on file     Physical Exam: Filed Vitals:   05/14/13 1518  BP: 107/66  Pulse: 74  Temp: 96.8 F (36 C)  Resp: 18  Physical Exam  Constitutional: He is oriented to person, place, and time. No distress.  Cachectic white male sitting in his recliner chair  HENT:  Head: Normocephalic and atraumatic.  Right Ear: External ear normal.  Left Ear: External ear normal.  Nose: Nose normal.  Mouth/Throat: Oropharynx is clear and moist. No oropharyngeal exudate.  Eyes: Conjunctivae and EOM are normal. Pupils are equal, round, and reactive to light. No scleral icterus.  glasses  Neck: Neck supple. No JVD present. No tracheal deviation present. No thyromegaly present.  Tenderness over paravertebral muscles of cervical spine, has decreased ROM due to prior fusion surgeries, several scars on his neck  Cardiovascular: Normal rate, regular rhythm and normal heart sounds.   Right foot was warm though purplish discoloration persists, is tender to touch, was waring a prevelon boot; left foot with palpable pulse but cooler toes, pale but otherwise not discolored, less tender than right  Pulmonary/Chest: Effort normal. No respiratory distress. He has wheezes. He has no rales.  Wearing oxygen  Abdominal: Soft. Bowel sounds are normal. He exhibits no distension and no mass. There is tenderness.  Bilateral lower quadrants at groin where incisions from fem fem bypass are  Musculoskeletal: Normal range of motion. He exhibits tenderness.  Neurological: He is alert and oriented to person, place, and time. No cranial nerve deficit.  Skin: Skin is warm and dry. He is not diaphoretic.  Has wound at rectum (I did not see this today--was evaluated by wound care physician), I did assess his groin incision  sites which were clean, dry, with steristrips intact, no erythema, warmth, drainage present, remained tender; has skin tear on right forearm, several ecchymoses from blood draws  Psychiatric: He has a normal mood and affect. His behavior is normal. Judgment and thought content normal.  Slightly flat affect    Labs reviewed: Basic Metabolic Panel:  Recent Labs  04/26/13 0603  05/06/13 0506 05/07/13 0555 05/10/13 1121  NA 141  < > 135* 139 136*  K 2.7*  < > 5.6* 4.0 4.9  CL 105  < > 100 98 97  CO2 25  < > 26 33* 29  GLUCOSE 152*  < > 89 89 112*  BUN 25*  < > 10 11 12   CREATININE 0.70  < > 0.76 0.87 0.91  CALCIUM 8.4  < > 8.2* 8.6 8.9  MG 1.8  --   --   --   --   < > = values in this interval not displayed. Liver Function Tests:  Recent Labs  04/21/13 0615  AST 44*  ALT 10  ALKPHOS 79  BILITOT 1.2  PROT 5.9*  ALBUMIN 2.5*  CBC:  Recent Labs  05/06/13 0810 05/07/13 0555 05/10/13 1121  WBC 8.1 5.5 8.0  HGB 10.7* 10.3* 11.0*  HCT 31.6* 30.9* 32.7*  MCV 96.3 97.8 95.9  PLT 163 174 PLATELET CLUMPS NOTED ON SMEAR, COUNT APPEARS ADEQUATE  wound culture 05/10/13 perianal area:  Gram Stain  ABUNDANT WBC PRESENT,BOTH PMN AND MONONUCLEAR RARE SQUAMOUS EPITHELIAL CELLS PRESENT MODERATE GRAM POSITIVE COCCI IN PAIRS FEW GRAM POSITIVE RODS RARE GRAM NEGATIVE RODS  Culture  MULTIPLE ORGANISMS PRESENT, NONE PREDOMINANT Note: NO STAPHYLOCOCCUS AUREUS ISOLATED NO GROUP A STREP (S.PYOGENES) ISOLATED    Imaging and Procedures reviewed: EGD 3/14 CT 3/27  Assessment/Plan 1. Chronic total occlusion of artery of the extremities -of right leg--pt says nothing can be done for this leg -s/p fem/fem bypass  -pain persists--see #2 for changes -f/u with Dr. Donnetta Hutching ASAP  2. PVD (peripheral vascular disease) -severe as in #1, s/p fem fem bypass--incisions seem to be healing well -continues with terrible pain in right leg -added neurontin 100mg  po tid routinely for pain  -also  increased dilaudid 4mg  so he can use 2 tabs if needed every 4 hrs--holding for sedation, of course  3. COPD (chronic obstructive pulmonary disease) -cont oxygen to keep sats >88% -cont spiriva, prednisone, advair and albuterol  4. GI bleed -f/u cbc, bmp  -remains weak and dizzy  5. Duodenal ulcer -felt to possibly be ischemic with his poor circulation -has h/o steroid use for RA and COPD which do not help -cont protonix  6. Protein-calorie malnutrition, severe -cont facility supplements and adjusting diet as he can tolerate -will start remeron 7.5mg  po qhs to help with appetite, mood and sleep  7. Perirectal abscess -cont bactrim DS as planned through 4/3 and monitoring by Dr. Jake Michaelis -will need f/u labs to assess when abx complete  8. Rheumatoid arthritis -by history; has been on steroids longstanding and this has caused immunosuppression  9. Dysphagia, unspecified(787.20) -working with speech therapy, was eating a pureed lunch when I saw him, says he feels like his food sticks and points to his lower stomach region--? Unusual sensation from the ulcers he has  10. Cervical spondylosis -discussed with patient that many of his symptoms including his frequent falls, weakness, and pain are likely coming from his neck -he's had several prior surgeries, but continues to lose his ability ambulate and be independent.    Functional status:  Requiring assistance with adls except meals at this time due to weakness, falling, pain  Family/ staff Communication: discussed with pt and his  nurse  Labs/tests ordered:   Cbc, bmp, f/u with Dr. Donnetta Hutching

## 2013-05-15 ENCOUNTER — Telehealth: Payer: Self-pay | Admitting: Neurology

## 2013-05-15 NOTE — Telephone Encounter (Signed)
I called patient. The patient is in an extended care facility. He indicates that he doesn't know anything about the daughter prescription written 2 days before he went into the extended care facility. The patient is getting pain medications in a facility now. The patient has an ischemic right leg. The patient also is now having upper GI bleed. The patient is to contact our office when he gets out of the extended care facility.

## 2013-05-15 NOTE — Telephone Encounter (Signed)
Patient needs written Rx for Oxycodone--please mail to patient--thank you.

## 2013-05-15 NOTE — Telephone Encounter (Signed)
Patient is requesting a refill on Oxycodone.  This med was d/c from med list by another provider and it appears an Rx for Dilaudid 4mg  #180 was written on 03/25.  Would you like to refill Oxycodone?  Please advise.  Thank you.

## 2013-05-17 ENCOUNTER — Ambulatory Visit (INDEPENDENT_AMBULATORY_CARE_PROVIDER_SITE_OTHER): Payer: Medicare Other | Admitting: Neurology

## 2013-05-17 ENCOUNTER — Encounter: Payer: Self-pay | Admitting: Neurology

## 2013-05-17 VITALS — BP 122/71 | HR 81

## 2013-05-17 DIAGNOSIS — M4712 Other spondylosis with myelopathy, cervical region: Secondary | ICD-10-CM

## 2013-05-17 DIAGNOSIS — G63 Polyneuropathy in diseases classified elsewhere: Secondary | ICD-10-CM

## 2013-05-17 DIAGNOSIS — M47817 Spondylosis without myelopathy or radiculopathy, lumbosacral region: Secondary | ICD-10-CM

## 2013-05-17 NOTE — Patient Instructions (Signed)

## 2013-05-17 NOTE — Progress Notes (Signed)
Reason for visit: Peripheral neuropathy  [name] is an 80 y.o. male  History of present illness:  [name] is a 5 -year-old right-handed white male with a history of a peripheral neuropathy, and a history of peripheral vascular disease. The patient had a bypass procedure to help revascularize the right leg. The patient has an ischemic ulcer of the right heel that required treatment. The patient is in an extended care facility for healing and for therapy. The patient continues to have ongoing pain, he has currently been switched to Dilaudid 4 mg every 4 hours if needed. The patient has noted that he is deaf in the left ear, and he is not certain when this started. The patient currently is nonweightbearing. The patient continues to have a lot of neck pain and low back pain.  Past Medical History  Diagnosis Date  . Hyperlipidemia   . History of colon polyps   . Spinal stenosis   . Anxiety   . BPH (benign prostatic hypertrophy)   . ED (erectile dysfunction)   . Osteoporosis   . Hypertension   . Chest pain     "I've had it" (01/31/2012)   . Peripheral vascular disease     "right leg is 100% blocked; left leg has stent, still ~ 75% blocked" (01/31/2012)  . Peripheral neuropathy     "bad" (01/31/2012)  . COPD (chronic obstructive pulmonary disease)   . Emphysema   . BRBPR (bright red blood per rectum)     "don't know what it's from; had some this week" (01/31/2012)  . Chronic lower back pain   . Pernicious anemia   . GERD (gastroesophageal reflux disease)   . Polyneuropathy in other diseases classified elsewhere 11/06/2012  . Prostate cancer 2004    "had 40 treatments of radiation" (01/31/2012)  . Breast cancer in male     "left" (01/31/2012)  . Basal cell carcinoma of nasal tip   . Pneumonia 1960's    "double"   . Cervical spondylosis   . Rheumatoid arthritis(714.0)   . Arthritis     "all over my body" (04/25/2013)  . DDD (degenerative disc disease), lumbosacral    "S1; L5" (01/31/2012)  . Lumbosacral spondylosis without myelopathy 11/06/2012  . Cellulitis of right leg   . Depression     "won't take RX though" (04/25/2013)  . Falls frequently     Past Surgical History  Procedure Laterality Date  . Basal cell carcinoma excision      Nose x 3  . Cholecystectomy  ?1990's  . Wrist fusion Right     "3 OR's; it's fused" (01/31/2012)  . Excisional hemorrhoidectomy  1960's  . Knee surgery Right      6 surgeries; went in for simple cartilage OR; ended up w/fused knee" (01/31/2012)  . Iliac artery stent  12-07-09    Stent done by Dr. Gwenlyn Found  . Total hip arthroplasty  08/16/2011    Procedure: TOTAL HIP ARTHROPLASTY ANTERIOR APPROACH;  Surgeon: Mcarthur Rossetti, MD;  Location: [facility];  Service: Orthopedics;  Laterality: Right;  Right total hip replacement  . Cervical disc surgery      "7 total; ended w/a fusion" (01/31/2012)  . Esophagogastroduodenoscopy  02/22/2012    Procedure: ESOPHAGOGASTRODUODENOSCOPY (EGD);  Surgeon: Arta Silence, MD;  Location: [facility];  Service: Endoscopy;  Laterality: N/A;  . Eus  02/22/2012    Procedure: UPPER ENDOSCOPIC ULTRASOUND (EUS) RADIAL;  Surgeon: Arta Silence, MD;  Location: [facility];  Service:  Endoscopy;  Laterality: N/A;  . Hernia repair    . Transurethral resection of prostate    . Aortogram  04/25/2013  . Esophagogastroduodenoscopy Left 04/27/2013    Procedure: ESOPHAGOGASTRODUODENOSCOPY (EGD);  Surgeon: Arta Silence, MD;  Location: [facility] ENDOSCOPY;  Service: Endoscopy;  Laterality: Left;  . Femoral-femoral bypass graft Bilateral 05/01/2013    Procedure: BYPASS GRAFT FEMORAL-FEMORAL ARTERY/ LEFT - RIGHT;  Surgeon: Rosetta Posner, MD;  Location: [facility] OR;  Service: Vascular;  Laterality: Bilateral;    Family History  Problem Relation Age of Onset  . Hypertension Mother   . Cancer Mother     colon cancer  . Stroke Mother   . Aneurysm Father     abdominal aortic  . Heart disease Brother     Social  history:  reports that he has been smoking Cigarettes.  He has a 126 pack-year smoking history. He has never used smokeless tobacco. He reports that he drinks alcohol. He reports that he does not use illicit drugs.    Allergies  Allergen Reactions  . Lipitor [Atorvastatin Calcium] Other (See Comments)    Myalgias "don't remember how bad" (01/31/2012)  . Penicillins Other (See Comments)    Passed out    Medications:  Current Outpatient Prescriptions on File Prior to Visit  Medication Sig Dispense Refill  . albuterol (PROVENTIL) (2.5 MG/3ML) 0.083% nebulizer solution Take 2.5 mg by nebulization every 4 (four) hours as needed for wheezing or shortness of breath.      . ALPRAZolam (XANAX) 1 MG tablet Take one tablet by mouth three times daily as needed for anxiety or sleep  90 tablet  5  . bisacodyl (DULCOLAX) 10 MG suppository Place 10 mg rectally as needed for moderate constipation.      . Fluticasone-Salmeterol (ADVAIR) 250-50 MCG/DOSE AEPB Inhale 1 puff into the lungs every 12 (twelve) hours.  2 each  0  . HYDROmorphone (DILAUDID) 4 MG tablet Take one tablet by mouth every four hours as needed for severe pain  180 tablet  0  . hydrOXYzine (ATARAX/VISTARIL) 25 MG tablet Take 25 mg by mouth 2 (two) times daily as needed for anxiety.      . magnesium hydroxide (MILK OF MAGNESIA) 400 MG/5ML suspension Take 30 mLs by mouth daily as needed for mild constipation.      . pantoprazole (PROTONIX) 40 MG tablet Take 1 tablet (40 mg total) by mouth 2 (two) times daily before a meal.  60 tablet  1  . prednisoLONE 5 MG TABS tablet Take 5 mg by mouth daily.      . rosuvastatin (CRESTOR) 20 MG tablet Take 20 mg by mouth at bedtime.       . sertraline (ZOLOFT) 50 MG tablet Take 1 tablet (50 mg total) by mouth daily.  30 tablet  0  . Sodium Phosphates (RA SALINE ENEMA RE) Place 1 enema rectally daily as needed (severe constipation).      Marland Kitchen sulfamethoxazole-trimethoprim (BACTRIM DS,SEPTRA DS) 800-160 MG per  tablet Take 1 tablet by mouth 2 (two) times daily.  14 tablet  0  . tiotropium (SPIRIVA) 18 MCG inhalation capsule Place 1 capsule (18 mcg total) into inhaler and inhale daily.  20 capsule  0  . traZODone (DESYREL) 50 MG tablet Take 50 mg by mouth at bedtime.       No current facility-administered medications on file prior to visit.    ROS:  Out of a complete 14 system review of symptoms, the patient complains only of  the following symptoms, and all other reviewed systems are negative.  Appetite change, loss of weight Neck stiffness Hearing loss, left ear Ringing in the ears, difficulty swallowing Double vision Shortness of breath Leg swelling Cold intolerance, excessive thirst Abdominal pain, nausea Restless leg, insomnia Difficulty urinating, frequency of urination Joint pain, joint swelling, back pain, achy muscles, muscle cramps, walking difficulties Dizziness, weakness  Blood pressure 122/71, pulse 81, weight 0 lb (0 kg).  Physical Exam  General: The patient is alert and cooperative at the time of the examination.  Skin: No significant peripheral edema is noted.   Neurologic Exam  Mental status: The patient is oriented x 3.  Cranial nerves: Facial symmetry is present. Speech is normal, no aphasia or dysarthria is noted. Extraocular movements are full. Visual fields are full.  Motor: The patient has good strength in both arms, and in the left leg. The right leg was not tested.  Sensory examination: Soft touch sensation is decreased on the right leg, symmetric in the arms.  Coordination: The patient has good finger-nose-finger bilaterally.  Gait and station: The patient is in a wheelchair, is not weightbearing at this time.  Reflexes: Deep tendon reflexes reveal relatively brisk reflexes in the triceps muscles bilaterally, with a decreased reflex in the left biceps, more normal in the right biceps. With the left leg, the knee jerk reflexes quite brisk, as his ankle  jerk. No definite Babinski is seen. The right knee is fused.   Assessment/Plan:  1. Peripheral neuropathy  2. Peripheral vascular disease  3. Chronic neck and low back pain  Clinical examination today appears to show that the reflexes in the left leg are quite brisk, clearly abnormal. The reflexes in the arms are also somewhat elevated. The patient has a history of cervical spine surgery, and given the new changes in reflexes, I will check a MRI of the cervical spine to rule out spinal cord compression. The patient denies any changes in bladder function. The patient will followup in 5 or 6 months. The patient currently is in an extended care facility, getting rehabilitation. In the future, we may consider a referral to a pain center for possible spinal stimulator to help the pain from the neuropathy.  Jill Alexanders MD 05/17/2013 7:40 PM  [facility] [address] [place], [place] [address]  Phone [phone] Fax [phone]

## 2013-05-18 DIAGNOSIS — M069 Rheumatoid arthritis, unspecified: Secondary | ICD-10-CM | POA: Insufficient documentation

## 2013-05-18 DIAGNOSIS — K611 Rectal abscess: Secondary | ICD-10-CM | POA: Insufficient documentation

## 2013-05-18 DIAGNOSIS — M47812 Spondylosis without myelopathy or radiculopathy, cervical region: Secondary | ICD-10-CM | POA: Insufficient documentation

## 2013-05-18 DIAGNOSIS — K269 Duodenal ulcer, unspecified as acute or chronic, without hemorrhage or perforation: Secondary | ICD-10-CM | POA: Insufficient documentation

## 2013-05-20 ENCOUNTER — Encounter: Payer: Self-pay | Admitting: Vascular Surgery

## 2013-05-21 ENCOUNTER — Encounter: Payer: Self-pay | Admitting: Vascular Surgery

## 2013-05-21 ENCOUNTER — Ambulatory Visit (INDEPENDENT_AMBULATORY_CARE_PROVIDER_SITE_OTHER): Payer: Self-pay | Admitting: Vascular Surgery

## 2013-05-21 VITALS — BP 160/64 | HR 78 | Resp 18 | Ht 68.0 in | Wt 118.0 lb

## 2013-05-21 DIAGNOSIS — I7092 Chronic total occlusion of artery of the extremities: Secondary | ICD-10-CM

## 2013-05-21 NOTE — Progress Notes (Signed)
Here today for followup of his left or right fem-fem bypass. He had a recent prolonged hospitalization with multiple issues to include decubitus ulcers over his right posterior calf and lateral ankle. 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 It is felt that this was not the cause of his ulcerations and was making it difficult for him to heal. He underwent a left to right fem-fem bypass with no difficulties associated with the procedure. He is here today for followup. He is in a wheelchair. He is quite debilitated. He has a history of multiple prior pain issues with the long history of difficulty with pain control. He reports that he has pain extensively all over today. His left right fem-fem incisions are healed. He has excellent femoral graft pulse. He does have excellent popliteal pulse on the right eye with a well-perfused foot. He has healed his lateral ankle ulcer and still has extensive decubitus ulceration over the posterior calf which does appear to be healing with local wound care  Impression and plan successful left right fem-fem bypass. We'll continue local wound care and his nursing facility. We'll see [place] again in 3 months for continued followup of his bypass

## 2013-06-12 ENCOUNTER — Non-Acute Institutional Stay (SKILLED_NURSING_FACILITY): Payer: Medicare Other | Admitting: Internal Medicine

## 2013-06-12 ENCOUNTER — Encounter: Payer: Self-pay | Admitting: Internal Medicine

## 2013-06-12 DIAGNOSIS — I7092 Chronic total occlusion of artery of the extremities: Secondary | ICD-10-CM

## 2013-06-12 DIAGNOSIS — I739 Peripheral vascular disease, unspecified: Secondary | ICD-10-CM

## 2013-06-12 DIAGNOSIS — G63 Polyneuropathy in diseases classified elsewhere: Secondary | ICD-10-CM

## 2013-06-12 DIAGNOSIS — J449 Chronic obstructive pulmonary disease, unspecified: Secondary | ICD-10-CM

## 2013-06-12 DIAGNOSIS — F329 Major depressive disorder, single episode, unspecified: Secondary | ICD-10-CM

## 2013-06-12 DIAGNOSIS — G4700 Insomnia, unspecified: Secondary | ICD-10-CM | POA: Insufficient documentation

## 2013-06-12 DIAGNOSIS — K269 Duodenal ulcer, unspecified as acute or chronic, without hemorrhage or perforation: Secondary | ICD-10-CM

## 2013-06-12 DIAGNOSIS — F32A Depression, unspecified: Secondary | ICD-10-CM

## 2013-06-12 DIAGNOSIS — F3289 Other specified depressive episodes: Secondary | ICD-10-CM

## 2013-06-12 DIAGNOSIS — R64 Cachexia: Secondary | ICD-10-CM

## 2013-06-12 DIAGNOSIS — M47812 Spondylosis without myelopathy or radiculopathy, cervical region: Secondary | ICD-10-CM

## 2013-06-12 NOTE — Progress Notes (Signed)
Patient ID: DELMOS [name], male   DOB: Mar 20, 1934, 79 y.o.   MRN: [id number]   This is a routine visit.  Level of care skilled  Facility-[facility].  Chief complaint-medical management of chronic medical conditions including severe peripheral vascular disease--COPD-cervical spondylosis-GI bleed-depression-protein calorie malnutrition--rheumatoid arthritis  HPI: 79 y.o. male with h/o known chronic total occlusion of his arteries of his right lower extremity, recent right hip fx with fusion of right knee, COPD, neuropathy, dysphagia, severe protein-calorie malnutrition, rheumatoid arthritis on steroids chronically was  hospitalized with right leg redness and swelling AND darks stools. He was found to have a GI bleed felt to be due to ischemic duodenal ulcers (EGD 3/14). He received a 2 unit prbc transfusion and h/h was then stable att 9-10. He underwent a right femoral-femoral bypass, as well, by Dr. Donnetta Hutching. He developed LEFT LE cellulitis with a calf ulcer and was placed on vanc and cipro thru 3/22.  On 05/10/13, he went out to the ED with concerns about a perirectal abscess. Notes there talk about cellulitis of the left leg, . Cbc, bmp were unremarkable. Wound culture was obtained and pending. CT that was done showed no osteomyelitis, but did reveal a small amount of fluid and gas surrounding the graft  He has had followup with vascular surgery Dr. Donnetta Hutching feels the bypass has been successful and has arranged for followup in several weeks  He is also seen neurology and they have ordered an MRI of the cervical spine although I do not see those results.  His main complaint tonight is leg pain this has been quite chronic he is on significant course of Dilaudid-he can taken 4 mg one tablet for moderate pain and 2 for severe he appears he takes 2 fairly often   He recently was started on Remeron for appetite stimulation he says his appetite is doing a bit better.  He is also on Zoloft for depression--- as well as  trazodone which was started for his insomnia and he says this appears to be helping as well .  ROS:  Review of Systems  Constitutional: Positive for  malaise/fatigue. Negative for fever, chills and diaphoresis.  HENT: Negative for congestion.  Eyes: Negative for blurred vision.  Respiratory: Positive for shortness of breath and wheezing this is intermittent he says his breathing remains at baseline .  Cardiovascular: Negative for chest pain, palpitations and leg swelling.  Gastrointestinal: Negative for abdominal pain, constipation, blood in stool and melena.  Genitourinary: Negative for dysuria, urgency and frequency.  Musculoskeletal: Positive for myalgias and neck pain. Negative for falls.  Skin: Negative for itching and rash.  Neurological: Positive for dizziness, tingling, sensory change and weakness. Negative for tremors, speech change, focal weakness, loss of consciousness and headaches.  Notes right foot continually feels like it is in ice cold water and also burns and tingles at the same time  Endo/Heme/Allergies: Bruises/bleeds easily.  Psychiatric/Behavioral: Positive for depression. Negative for suicidal ideas and memory loss. The patient has insomnia however he says the trazodone is helping.  Past Medical History   Diagnosis  Date   .  Hyperlipidemia    .  History of colon polyps    .  Spinal stenosis    .  Anxiety    .  BPH (benign prostatic hypertrophy)    .  ED (erectile dysfunction)    .  Osteoporosis    .  Hypertension    .  Chest pain      "I've had it" (  01/31/2012)   .  Peripheral vascular disease      "right leg is 100% blocked; left leg has stent, still ~ 75% blocked" (01/31/2012)   .  Peripheral neuropathy      "bad" (01/31/2012)   .  COPD (chronic obstructive pulmonary disease)    .  Emphysema    .  BRBPR (bright red blood per rectum)      "don't know what it's from; had some this week" (01/31/2012)   .  Chronic lower back pain    .  Pernicious anemia      .  GERD (gastroesophageal reflux disease)    .  Polyneuropathy in other diseases classified elsewhere  11/06/2012   .  Prostate cancer  2004     "had 40 treatments of radiation" (01/31/2012)   .  Breast cancer in male      "left" (01/31/2012)   .  Basal cell carcinoma of nasal tip    .  Pneumonia  1960's     "double"   .  Cervical spondylosis    .  Rheumatoid arthritis(714.0)    .  Arthritis      "all over my body" (04/25/2013)   .  DDD (degenerative disc disease), lumbosacral      "S1; L5" (01/31/2012)   .  Lumbosacral spondylosis without myelopathy  11/06/2012   .  Cellulitis of right leg    .  Depression      "won't take RX though" (04/25/2013)   .  Falls frequently     Past Surgical History   Procedure  Laterality  Date   .  Basal cell carcinoma excision       Nose x 3   .  Cholecystectomy   ?1990's   .  Wrist fusion  Right      "3 OR's; it's fused" (01/31/2012)   .  Excisional hemorrhoidectomy   1960's   .  Knee surgery  Right      6 surgeries; went in for simple cartilage OR; ended up w/fused knee" (01/31/2012)   .  Iliac artery stent   12-07-09     Stent done by Dr. Gwenlyn Found   .  Total hip arthroplasty   08/16/2011     Procedure: TOTAL HIP ARTHROPLASTY ANTERIOR APPROACH; Surgeon: Mcarthur Rossetti, MD; Location: [facility]; Service: Orthopedics; Laterality: Right; Right total hip replacement   .  Cervical disc surgery       "7 total; ended w/a fusion" (01/31/2012)   .  Esophagogastroduodenoscopy   02/22/2012     Procedure: ESOPHAGOGASTRODUODENOSCOPY (EGD); Surgeon: Arta Silence, MD; Location: [facility]; Service: Endoscopy; Laterality: N/A;   .  Eus   02/22/2012     Procedure: UPPER ENDOSCOPIC ULTRASOUND (EUS) RADIAL; Surgeon: Arta Silence, MD; Location: [facility]; Service: Endoscopy; Laterality: N/A;   .  Hernia repair     .  Transurethral resection of prostate     .  Aortogram   04/25/2013   .  Esophagogastroduodenoscopy  Left  04/27/2013     Procedure:  ESOPHAGOGASTRODUODENOSCOPY (EGD); Surgeon: Arta Silence, MD; Location: [facility] END[facility]COPY; Service: Endoscopy; Laterality: Left;   .  Femoral-femoral bypass graft  Bilateral  05/01/2013     Procedure: BYPASS GRAFT FEMORAL-FEMORAL ARTERY/ LEFT - RIGHT; Surgeon: Rosetta Posner, MD; Location: [facility] OR; Service: Vascular; Laterality: Bilateral;   Social History:  reports that he has been smoking Cigarettes. He has a 126 pack-year smoking history. He has never used smokeless tobacco. He reports that  he drinks alcohol. He reports that he does not use illicit drugs.  Family History   Problem  Relation  Age of Onset   .  Hypertension  Mother    .  Cancer  Mother      colon cancer   .  Stroke  Mother    .  Aneurysm  Father      abdominal aortic   .  Heart disease  Brother    Medications:  Patient's Medications   New Prescriptions    No medications on file   Previous Medications    ALBUTEROL (PROVENTIL) (2.5 MG/3ML) 0.083% NEBULIZER SOLUTION  Take 2.5 mg by nebulization every 4 (four) hours as needed for wheezing or shortness of breath.    ALPRAZOLAM (XANAX) 1 MG TABLET  Take one tablet by mouth three times daily as needed for anxiety or sleep    BISACODYL (DULCOLAX) 10 MG SUPPOSITORY  Place 10 mg rectally as needed for moderate constipation.    FLUTICASONE-SALMETEROL (ADVAIR) 250-50 MCG/DOSE AEPB  Inhale 1 puff into the lungs every 12 (twelve) hours.    HYDROMORPHONE (DILAUDID) 4 MG TABLET  Take one tablet by mouth every four hours moderate pain-2 for severe pain    HYDROXYZINE (ATARAX/VISTARIL) 25 MG TABLET  Take 25 mg by mouth 2 (two) times daily as needed for anxiety.    MAGNESIUM HYDROXIDE (MILK OF MAGNESIA) 400 MG/5ML SUSPENSION  Take 30 mLs by mouth daily as needed for mild constipation.    PANTOPRAZOLE (PROTONIX) 40 MG TABLET  Take 1 tablet (40 mg total) by mouth 2 (two) times daily before a meal.    PREDNISOLONE 5 MG TABS TABLET  Take 5 mg by mouth daily.    ROSUVASTATIN (CRESTOR) 20 MG TABLET  Take  20 mg by mouth at bedtime.    SERTRALINE (ZOLOFT) 50 MG TABLET  Take 1 tablet (50 mg total) by mouth daily.    SODIUM PHOSPHATES (RA SALINE ENEMA RE)  Place 1 enema rectally daily as needed (severe constipation).      Remeron 7.5 mg  . 1 tablet by mouth daily    TIOTROPIUM (SPIRIVA) 18 MCG INHALATION CAPSULE  Place 1 capsule (18 mcg total) into inhaler and inhale daily.    TRAZODONE (DESYREL) 50 MG TABLET  Take 50 mg by mouth at bedtime.     Physical Exam Temperature 98.1 pulse 60 respirations 16 blood pressure taken manually 142/68-listed blood pressure is 119/57-94/54-O2 saturation 98% on 2 L  O2 via nasal cannula   Constitutional: He is oriented to person, place, and time. No distress.  Cachectic white male lying in bed HENT:  Head: Normocephalic and atraumatic.  Right Ear: External ear normal.  Left Ear: External ear normal.  Nose: Nose normal.  Mouth/Throat: Oropharynx is clear and moist. No oropharyngeal exudate.  Eyes: Conjunctivae and EOM are normal. Pupils are equal, round, and reactive to light. No scleral icterus.   Neck:.  Tenderness over paravertebral muscles of cervical spine, has decreased ROM due to prior fusion surgeries, several scars on his neck  Cardiovascular: Normal rate, regular rhythm and normal heart sounds R. sounds are distant .  Right foot was warm, is tender to touch, ; left foot with palpable pulse , pale but otherwise not discolored, less tender than right  Pulmonary/Chest: Effort normal. No respiratory distress--shallow air entry. He has no rales.  Wearing oxygen  Abdominal: Soft. Bowel sounds are normal. He exhibits no distension and no mass. There isminimal tenderness.  B  Musculoskeletal:  Normal range of motion. He exhibits tenderness. Frailty he does have a lower right shin area covered with dry dressing  Neurological: He is alert and oriented to person, place, and time. No cranial nerve deficit.  Skin: Skin is warm and dry. He is not diaphoretic.    Has wound at rectum (I did not see this today- this is followed by wound care   Psychiatric: He has a normal mood and affect. His behavior is normal. Judgment and thought content normal.  Slightly flat affect--but quite talkative  Labs reviewed:  Basic Metabolic Panel:  Recent Labs    05/13/2013.  WBC 6.6 hemoglobin 10.0 platelets 221.  Sodium 136 potassium 4.5 BUN 13 creatinine 0.96   04/26/13 0603   05/06/13 0506  05/07/13 0555  05/10/13 1121   NA  141  < >  135*  139  136*   K  2.7*  < >  5.6*  4.0  4.9   CL  105  < >  100  98  97   CO2  25  < >  26  33*  29   GLUCOSE  152*  < >  89  89  112*   BUN  25*  < >  10  11  12    CREATININE  0.70  < >  0.76  0.87  0.91   CALCIUM  8.4  < >  8.2*  8.6  8.9   MG  1.8  --  --  --  --   < > = values in this interval not displayed.  Liver Function Tests:  Recent Labs   04/21/13 0615   AST  44*   ALT  10   ALKPHOS  79   BILITOT  1.2   PROT  5.9*   ALBUMIN  2.5*   CBC:  Recent Labs   05/06/13 0810  05/07/13 0555  05/10/13 1121   WBC  8.1  5.5  8.0   HGB  10.7*  10.3*  11.0*   HCT  31.6*  30.9*  32.7*   MCV  96.3  97.8  95.9   PLT  163  174  PLATELET CLUMPS NOTED ON SMEAR, COUNT APPEARS ADEQUATE   wound culture 05/10/13 perianal area: Gram Stain  ABUNDANT WBC PRESENT,BOTH PMN AND MONONUCLEAR RARE SQUAMOUS EPITHELIAL CELLS PRESENT MODERATE GRAM POSITIVE COCCI IN PAIRS FEW GRAM POSITIVE RODS RARE GRAM NEGATIVE RODS   Culture  MULTIPLE ORGANISMS PRESENT, NONE PREDOMINANT Note: NO STAPHYLOCOCCUS AUREUS ISOLATED NO GROUP A STREP (S.PYOGENES) ISOLATED   Imaging and Procedures reviewed:  EGD 3/14  CT 3/27  Assessment/Plan  1. Chronic total occlusion of artery of the extremities  -of right leg--pt says nothing can be done for this leg  -s/p fem/fem bypass  -pain persists--but he says that Dilaudid is helping   -f/u with Dr. Donnetta Hutching   2. PVD (peripheral vascular disease)  -severe as in #1, s/p fem fem bypass--per Dr.  Donnetta Hutching this was successful     3. COPD (chronic obstructive pulmonary disease)  -cont oxygen to keep sats >88%  -cont spiriva, prednisone, advair and albuterol  4. GI bleed  -f/u cbc, bmp  -remains weak and dizzy at times  5. Duodenal ulcer  -felt to possibly be ischemic with his poor circulation  -has h/o steroid use for RA and COPD which do not help  -cont protonix--we'll update CBC  6. Protein-calorie malnutrition, severe  -cont facility supplements and adjusting diet as he can tolerate  -  on remeron 7.5mg  po qhs to help with appetite, mood and sleep--he says the Remeron is helping improve his appetite--we'll update metabolic panel   7. Perirectal abscess  -He has completed antibiotic this is followed by wound care   8. Rheumatoid arthritis  -by history; has been on steroids longstanding and this has caused immunosuppression --note  glucose on recent metabolic panel was 122 will order a fasting glucose     9. Cervical spondylosis  -discussed with patient bellieves many of his symptoms including his frequent falls, weakness, and pain are likely coming from his neck  -he's had several prior surgeries, but continues to lose his ability ambulate and be independent.  Functional status: Requiring assistance with adls except meals at this time due to weakness, falling, pain He has been seen by neurology and apparently an MRI of cervical spine has been ordered although I do not see those results -they are following this  10 -- depression-he continues on Zoloft.  #11-history of insomnia-he is now on trazodone he says this is helping  [phone]

## 2013-06-22 ENCOUNTER — Non-Acute Institutional Stay (SKILLED_NURSING_FACILITY): Payer: Medicare Other | Admitting: Internal Medicine

## 2013-06-22 ENCOUNTER — Encounter: Payer: Self-pay | Admitting: Internal Medicine

## 2013-06-22 DIAGNOSIS — H8110 Benign paroxysmal vertigo, unspecified ear: Secondary | ICD-10-CM

## 2013-06-22 DIAGNOSIS — H6980 Other specified disorders of Eustachian tube, unspecified ear: Secondary | ICD-10-CM

## 2013-06-22 NOTE — Progress Notes (Signed)
MRN: [id number] Name: [name]  Sex: male Age: 79 y.o. DOB: 1934/03/25  [place] #: [name] Facility/Room: 109 Level Of Care: SNF Provider: Hennie Duos Emergency Contacts: Extended Emergency Contact Information Primary Emergency Contact: [facility] Address: [address]          [place], [place] [address] [name] of [place] Phone: [phone] Relation: Daughter Secondary Emergency Contact: [name] [place] of [place] Mobile Phone: [phone] Relation: Son  Code Status: FULL  Allergies: Lipitor and Penicillins  Chief Complaint  Patient presents with  . Acute Visit    HPI: Patient is 79 y.o. male who nursing has asked me to see because pt c/o can't hear out of L ear.  Past Medical History  Diagnosis Date  . Hyperlipidemia   . History of colon polyps   . Spinal stenosis   . Anxiety   . BPH (benign prostatic hypertrophy)   . ED (erectile dysfunction)   . Osteoporosis   . Hypertension   . Chest pain     "I've had it" (01/31/2012)   . Peripheral vascular disease     "right leg is 100% blocked; left leg has stent, still ~ 75% blocked" (01/31/2012)  . Peripheral neuropathy     "bad" (01/31/2012)  . COPD (chronic obstructive pulmonary disease)   . Emphysema   . BRBPR (bright red blood per rectum)     "don't know what it's from; had some this week" (01/31/2012)  . Chronic lower back pain   . Pernicious anemia   . GERD (gastroesophageal reflux disease)   . Polyneuropathy in other diseases classified elsewhere 11/06/2012  . Prostate cancer 2004    "had 40 treatments of radiation" (01/31/2012)  . Breast cancer in male     "left" (01/31/2012)  . Basal cell carcinoma of nasal tip   . Pneumonia 1960's    "double"   . Cervical spondylosis   . Rheumatoid arthritis(714.0)   . Arthritis     "all over my body" (04/25/2013)  . DDD (degenerative disc disease), lumbosacral     "S1; L5" (01/31/2012)  . Lumbosacral spondylosis without myelopathy  11/06/2012  . Cellulitis of right leg   . Depression     "won't take RX though" (04/25/2013)  . Falls frequently     Past Surgical History  Procedure Laterality Date  . Basal cell carcinoma excision      Nose x 3  . Cholecystectomy  ?1990's  . Wrist fusion Right     "3 OR's; it's fused" (01/31/2012)  . Excisional hemorrhoidectomy  1960's  . Knee surgery Right      6 surgeries; went in for simple cartilage OR; ended up w/fused knee" (01/31/2012)  . Iliac artery stent  12-07-09    Stent done by Dr. Gwenlyn Found  . Total hip arthroplasty  08/16/2011    Procedure: TOTAL HIP ARTHROPLASTY ANTERIOR APPROACH;  Surgeon: Mcarthur Rossetti, MD;  Location: [facility];  Service: Orthopedics;  Laterality: Right;  Right total hip replacement  . Cervical disc surgery      "7 total; ended w/a fusion" (01/31/2012)  . Esophagogastroduodenoscopy  02/22/2012    Procedure: ESOPHAGOGASTRODUODENOSCOPY (EGD);  Surgeon: Arta Silence, MD;  Location: [facility];  Service: Endoscopy;  Laterality: N/A;  . Eus  02/22/2012    Procedure: UPPER ENDOSCOPIC ULTRASOUND (EUS) RADIAL;  Surgeon: Arta Silence, MD;  Location: [facility];  Service: Endoscopy;  Laterality: N/A;  . Hernia repair    . Transurethral resection of prostate    .  Aortogram  04/25/2013  . Esophagogastroduodenoscopy Left 04/27/2013    Procedure: ESOPHAGOGASTRODUODENOSCOPY (EGD);  Surgeon: Arta Silence, MD;  Location: [facility] ENDOSCOPY;  Service: Endoscopy;  Laterality: Left;  . Femoral-femoral bypass graft Bilateral 05/01/2013    Procedure: BYPASS GRAFT FEMORAL-FEMORAL ARTERY/ LEFT - RIGHT;  Surgeon: Rosetta Posner, MD;  Location[facility];  Service: Vascular;  Laterality: Bilateral;      Medication List       This list is accurate as of: 06/22/13 11:59 PM.  Always use your most recent med list.               albuterol (2.5 MG/3ML) 0.083% nebulizer solution  Commonly known as:  PROVENTIL  Take 2.5 mg by nebulization every 4 (four) hours as needed for  wheezing or shortness of breath.     ALPRAZolam 1 MG tablet  Commonly known as:  XANAX  Take one tablet by mouth three times daily as needed for anxiety or sleep     bisacodyl 10 MG suppository  Commonly known as:  DULCOLAX  Place 10 mg rectally as needed for moderate constipation.     Fluticasone-Salmeterol 250-50 MCG/DOSE Aepb  Commonly known as:  ADVAIR  Inhale 1 puff into the lungs every 12 (twelve) hours.     gabapentin 100 MG capsule  Commonly known as:  NEURONTIN  Take 100 mg by mouth 3 (three) times daily.     HYDROmorphone 4 MG tablet  Commonly known as:  DILAUDID  Take 4 mg by mouth every 4 (four) hours as needed for moderate pain (and 2 tabs by mouth every 4 hours as needed for severe pain).     hydrOXYzine 25 MG tablet  Commonly known as:  ATARAX/VISTARIL  Take 25 mg by mouth 2 (two) times daily as needed for anxiety.     magnesium hydroxide 400 MG/5ML suspension  Commonly known as:  MILK OF MAGNESIA  Take 30 mLs by mouth daily as needed for mild constipation.     mirtazapine 7.5 MG tablet  Commonly known as:  REMERON  Take 7.5 mg by mouth at bedtime.     pantoprazole 40 MG tablet  Commonly known as:  PROTONIX  Take 1 tablet (40 mg total) by mouth 2 (two) times daily before a meal.     prednisoLONE 5 MG Tabs tablet  Take 5 mg by mouth daily.     RA SALINE ENEMA RE  Place 1 enema rectally daily as needed (severe constipation).     rosuvastatin 20 MG tablet  Commonly known as:  CRESTOR  Take 20 mg by mouth at bedtime.     sertraline 50 MG tablet  Commonly known as:  ZOLOFT  Take 1 tablet (50 mg total) by mouth daily.     tiotropium 18 MCG inhalation capsule  Commonly known as:  SPIRIVA  Place 1 capsule (18 mcg total) into inhaler and inhale daily.     traZODone 50 MG tablet  Commonly known as:  DESYREL  Take 50 mg by mouth at bedtime.     vitamin C 500 MG tablet  Commonly known as:  ASCORBIC ACID  Take 500 mg by mouth 2 (two) times daily.      zinc sulfate 220 MG capsule  Take 220 mg by mouth daily.        No orders of the defined types were placed in this encounter.    Immunization History  Administered Date(s) Administered  . Influenza Split 11/15/2011, 11/14/2012  . Influenza-Unspecified 10/15/2012  .  Pneumococcal Polysaccharide-23 11/14/2008    History  Substance Use Topics  . Smoking status: Former Smoker -- 2.00 packs/day for 63 years    Types: Cigarettes    Quit date: 04/30/2013  . Smokeless tobacco: Never Used  . Alcohol Use: Yes     Comment: 04/25/2013 "used to be a heavy drinker; stopped drinking in 1975"    Review of Systems  DATA OBTAINED: from patient GENERAL:  no fevers, fatigue, appetite changes SKIN: No itching, rash HEENT: slt pressure type feeling R ear; no cold sx, no cough, is having vertigo RESPIRATORY: No cough, wheezing, SOB CARDIAC: No chest pain, palpitations, lower extremity edema  GI: No abdominal pain, No N/V/D or constipation, No heartburn or reflux  GU: No dysuria, frequency or urgency, or incontinence  MUSCULOSKELETAL: No unrelieved bone/joint pain NEUROLOGIC: No headache, +dizziness like vertigo; no focal weakness PSYCHIATRIC: No overt anxiety or sadness. Sleeps well.   Filed Vitals:   06/22/13 1049  BP: 126/65  Pulse: 67  Temp: 97.5 F (36.4 C)  Resp: 20    Physical Exam  GENERAL APPEARANCE: Alert, conversant. Appropriately groomed. No acute distress  SKIN: No diaphoresis rash, or wounds HEENT: No redness to TM or canal; OP - NL RESPIRATORY: Breathing is even, unlabored. Lung sounds are clear   CARDIOVASCULAR: Heart RRR no murmurs, rubs or gallops. No peripheral edema  GASTROINTESTINAL: Abdomen is soft, non-tender, not distended w/ normal bowel sounds.  GENITOURINARY: Bladder non tender, not distended  MUSCULOSKELETAL: No abnormal joints or musculature NEUROLOGIC: Cranial nerves 2-12 grossly intact. Moves all extremities no tremor. PSYCHIATRIC: Mood and affect  appropriate to situation, no behavioral issues  Patient Active Problem List   Diagnosis Date Noted  . Insomnia 06/12/2013  . Duodenal ulcer 05/18/2013  . Perirectal abscess 05/18/2013  . Rheumatoid arthritis 05/18/2013  . Cervical spondylosis   . Acute blood loss anemia 04/27/2013  . GI bleed 04/26/2013  . Protein-calorie malnutrition, severe 04/26/2013  . Dysphagia, unspecified(787.20) 04/24/2013  . Diarrhea 04/24/2013  . Cough 04/24/2013  . Cellulitis 04/20/2013  . Sepsis 04/20/2013  . Pneumonia, organism unspecified 01/25/2013  . Polyneuropathy in other diseases classified elsewhere 11/06/2012  . Lumbosacral spondylosis without myelopathy 11/06/2012  . Pain localized to upper abdomen 02/03/2012  . Depression 01/31/2012  . Failure to thrive 01/31/2012  . Cachexia 01/31/2012  . COPD exacerbation 01/31/2012  . Pancreatic mass 01/31/2012  . Weight loss 01/31/2012  . Syncope 08/14/2011  . Fall 08/14/2011  . Femur fracture 08/14/2011  . Thrombocytopenia 08/14/2011  . COPD (chronic obstructive pulmonary disease) 08/14/2011  . PVD (peripheral vascular disease) 08/14/2011  . Rheumatoid arthritis 08/14/2011  . Anxiety 08/14/2011  . Chronic total occlusion of artery of the extremities 05/10/2011    CBC    Component Value Date/Time   WBC 8.0 05/10/2013 1121   RBC 3.41* 05/10/2013 1121   RBC 2.92* 08/17/2011 0645   HGB 11.0* 05/10/2013 1121   HCT 32.7* 05/10/2013 1121   PLT PLATELET CLUMPS NOTED ON SMEAR, COUNT APPEARS ADEQUATE 05/10/2013 1121   MCV 95.9 05/10/2013 1121   LYMPHSABS 0.7 01/31/2012 2006   MONOABS 0.3 01/31/2012 2006   EOSABS 0.0 01/31/2012 2006   BASOSABS 0.0 01/31/2012 2006    CMP     Component Value Date/Time   NA 136* 05/10/2013 1121   K 4.9 05/10/2013 1121   CL 97 05/10/2013 1121   CO2 29 05/10/2013 1121   GLUCOSE 112* 05/10/2013 1121   BUN 12 05/10/2013 1121   CREATININE 0.91 05/10/2013  1121   CALCIUM 8.9 05/10/2013 1121   PROT 5.9* 04/21/2013 0615   ALBUMIN  2.5* 04/21/2013 0615   AST 44* 04/21/2013 0615   ALT 10 04/21/2013 0615   ALKPHOS 79 04/21/2013 0615   BILITOT 1.2 04/21/2013 0615   GFRNONAA 80* 05/10/2013 1121   GFRAA >90 05/10/2013 1121    Assessment and Plan  ETD - START CLARITIN 10 MG PO DAILY\  VERTIGO - reinforcing first dx;no sgns of bacterial infection; antivert 25 TID until vertigo resolves  Hennie Duos, MD

## 2013-06-24 ENCOUNTER — Non-Acute Institutional Stay (SKILLED_NURSING_FACILITY): Payer: Medicare Other | Admitting: Internal Medicine

## 2013-06-24 DIAGNOSIS — R52 Pain, unspecified: Secondary | ICD-10-CM

## 2013-06-24 NOTE — Progress Notes (Signed)
Patient ID: [name], male   DOB: 10-19-1934, 79 y.o.   MRN: [id number]   This is an acute visit.  Level of care skilled.  Facility AF  Chief complaint-acute visit secondary to diffuse pain left hip left arm neck area.  History of present illness.  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  He recently underwent a right femoral femoral bypass by Dr. Barrington Ellison did develop cellulitis and this was treated with antibiotics.  Apparently the bypass was deemed successful.  He continues to complain of pain more so on his left hip left arm and neck area-he is on significant pain medication including Dilaudid 4 milligrams 1 tab for moderate pain 2 for severe. He takes 2 quite often.  He states this does give him relief but he's concerned about the pain.  There's been no history of trauma or fall to my knowledge on the left side.  Family medical social history has been reviewed per previous progress notes most recently 06/13/2013 and admission note on 05/16/2013.  He does have a significant history of peripheral vascular disease as well as peripheral neuropathy and polyneuropathy along with cervical spondylosis and lumbar sacral spondylosis and degenerative disc disease  Review of systems.  In general does not complain of fever or chills.  Respiratory does not complaining of increased shortness of breath he does have a cough at times however.  Cardiac does not complaining of chest pain.  GI does not complaining of abdominal pain however there is some tenderness when the hip lower abdominal area is palpated on the left  Musc- skeletal-has diffuse pain complaints most prominently left hip the left arm and neck area  Neurologic does not complaining of numbness tonight does complain of some dizziness he says this has gotten a bit better since  he was seen last week by her service and put on Claritin apparently for suspicions of vertigo  Temperature 97.7 pulse 76 respirations 18 blood pressure taken manually 118/60   In general this is a frail elderly male in no distress resting comfortably in bed he does appear to be somewhat anxious however.  The skin is warm and dry.  Neck there continues to be some tenderness of the entire area and upper spine this does not appear to be new however.  Heart distant heart sounds regular rate and rhythm cannot appreciate murmur gallop or rub.  Chest is clear to auscultation with shallow air entry no labored breathing.  Abdomen is soft does not appear to be tender except more in the lower left hip area most prominently.  Muscle skeletal moves all extremities x4 with lower extremity weakness does not appear to have significant pain with flexion of the left knee although he does have significant right leg pain which appears unchanged if anything possibly better than previous exam-there is some mild tenderness to palpation and pain with flexion and extension of the left hip.  In regards to left arm I do not see any edema erythema radial pulse is intact grip strength is strong however he does complain of pain with flexion and extension at the elbow he says this involves his entire arm and shoulder area I did not note any deformities--other than arthritic changes     Labs.  06/13/2013.  Sodium 139 potassium 3.9 BUN 16 creatinine 0.8.  CBC 5.1 hemoglobin 10.4 platelets  158.  Assessment and plan -#1-diffuse musculoskeletal pain most prominently left hip left arm and neck area we'll x-ray these areas I did discuss this with the patient at this point since he is on significant pain medication would be hesitant to increase this he expressed understanding I will await x-ray results and monitor he does apparently have an MRI scheduled as well of the neck area spine     [id number]      .

## 2013-06-25 ENCOUNTER — Encounter: Payer: Self-pay | Admitting: Neurology

## 2013-07-04 ENCOUNTER — Inpatient Hospital Stay
Admission: RE | Admit: 2013-07-04 | Discharge: 2013-07-04 | Disposition: A | Discharge status: Home / Self Care | Payer: Medicare Other | Source: Ambulatory Visit | Attending: Neurology | Admitting: Neurology

## 2013-07-05 ENCOUNTER — Ambulatory Visit
Admission: RE | Admit: 2013-07-05 | Discharge: 2013-07-05 | Disposition: A | Discharge status: Home / Self Care | Payer: Medicare Other | Source: Ambulatory Visit | Attending: Neurology | Admitting: Neurology

## 2013-07-05 DIAGNOSIS — M4712 Other spondylosis with myelopathy, cervical region: Secondary | ICD-10-CM

## 2013-07-05 DIAGNOSIS — G63 Polyneuropathy in diseases classified elsewhere: Secondary | ICD-10-CM

## 2013-07-05 DIAGNOSIS — M47817 Spondylosis without myelopathy or radiculopathy, lumbosacral region: Secondary | ICD-10-CM

## 2013-07-08 ENCOUNTER — Telehealth: Payer: Self-pay | Admitting: Neurology

## 2013-07-08 DIAGNOSIS — R42 Dizziness and giddiness: Secondary | ICD-10-CM

## 2013-07-08 NOTE — Telephone Encounter (Signed)
  I called the patient. He indicates that the neck is still hurting him and he has left arm pain. 2 weeks ago, he lost hearing in the left ear and he has vertigo. No headache. ? Viral neuronitis or CVA. I will get an MRI of the brain.  MRI of the cervical spine does not show cord compression. The patient was referred to a pain center.   MRI Cervical spine 07/07/13:  Impression   Abnormal MRI scan of the cervical spine showing postoperative  changes of anterior cervical fusion at C4-5 with mild narrowing of the  canal throughout most significant at C7-T1 where there is broad-based disc  osteophyte protrusion and hypertrophy of ligamentum flavum and facet  resulting in mild canal and bilateral foraminal narrowing.

## 2013-07-09 ENCOUNTER — Telehealth: Payer: Self-pay | Admitting: Neurology

## 2013-07-09 NOTE — Telephone Encounter (Signed)
I called the physical therapist. I had are talked to the patient yesterday, he indicated onset of deafness in the left ear and vertigo beginning 2 weeks ago. I have ordered an MRI of the brain to exclude the possibility of a left anterior inferior cerebellar artery distribution stroke.

## 2013-07-09 NOTE — Telephone Encounter (Signed)
Manuela Schwartz PT at [facility], calling to state patient is getting close to end of stage of PT. She's been working with strengthening and balance, pt experiencing dizziness. She would like to obtain information and a understanding as to where the dizziness is coming from. She's requesting Dr. Eugenie Birks to return her call at earliest convenience. [phone].

## 2013-07-09 NOTE — Telephone Encounter (Signed)
Manuela Schwartz PT at [facility], calling to state patient is getting close to end of stage of PT.  She's been working with strengthening and balance, pt experiencing dizziness.  She would like to obtain information and a understanding as to where the dizziness is coming from.  She's requesting Dr. Eugenie Birks to return her call at earliest convenience.  [phone].

## 2013-07-12 ENCOUNTER — Other Ambulatory Visit: Payer: Self-pay | Admitting: *Deleted

## 2013-07-12 MED ORDER — HYDROMORPHONE HCL 4 MG PO TABS: ORAL_TABLET | ORAL | Status: DC

## 2013-07-12 NOTE — Telephone Encounter (Signed)
[place]

## 2013-07-17 ENCOUNTER — Ambulatory Visit
Admission: RE | Admit: 2013-07-17 | Discharge: 2013-07-17 | Disposition: A | Discharge status: Home / Self Care | Payer: Medicare Other | Source: Ambulatory Visit | Attending: Neurology | Admitting: Neurology

## 2013-07-17 DIAGNOSIS — R42 Dizziness and giddiness: Secondary | ICD-10-CM

## 2013-07-21 ENCOUNTER — Telehealth: Payer: Self-pay | Admitting: Neurology

## 2013-07-21 NOTE — Telephone Encounter (Signed)
I called the patient. The MRI of the brain did not show a subacute left AICA stroke. The hearing changes and vertigo may be related to viral vestibular neuronitis.

## 2013-07-22 ENCOUNTER — Encounter: Payer: Self-pay | Admitting: Internal Medicine

## 2013-07-22 ENCOUNTER — Non-Acute Institutional Stay (SKILLED_NURSING_FACILITY): Payer: Medicare Other | Admitting: Internal Medicine

## 2013-07-22 DIAGNOSIS — K269 Duodenal ulcer, unspecified as acute or chronic, without hemorrhage or perforation: Secondary | ICD-10-CM

## 2013-07-22 DIAGNOSIS — F411 Generalized anxiety disorder: Secondary | ICD-10-CM

## 2013-07-22 DIAGNOSIS — M069 Rheumatoid arthritis, unspecified: Secondary | ICD-10-CM

## 2013-07-22 DIAGNOSIS — M47812 Spondylosis without myelopathy or radiculopathy, cervical region: Secondary | ICD-10-CM

## 2013-07-22 DIAGNOSIS — F419 Anxiety disorder, unspecified: Secondary | ICD-10-CM

## 2013-07-22 DIAGNOSIS — I739 Peripheral vascular disease, unspecified: Secondary | ICD-10-CM

## 2013-07-22 DIAGNOSIS — G4700 Insomnia, unspecified: Secondary | ICD-10-CM

## 2013-07-22 DIAGNOSIS — J449 Chronic obstructive pulmonary disease, unspecified: Secondary | ICD-10-CM

## 2013-07-22 DIAGNOSIS — R52 Pain, unspecified: Secondary | ICD-10-CM

## 2013-07-22 DIAGNOSIS — G63 Polyneuropathy in diseases classified elsewhere: Secondary | ICD-10-CM

## 2013-07-22 DIAGNOSIS — R1310 Dysphagia, unspecified: Secondary | ICD-10-CM

## 2013-07-22 NOTE — Progress Notes (Signed)
Patient ID: [name], male   DOB: 05/01/1934, 79 y.o.   MRN: [id number]   This is a discharge note.  Level of care skilled.  Facility AF.  Chief complaint-discharge note.  HPI: 79 y.o. male with h/o known chronic total occlusion of his arteries of his right lower extremity, recent right hip fx with fusion of right knee, COPD, neuropathy, dysphagia, severe protein-calorie malnutrition, rheumatoid arthritis on steroids chronically was  hospitalized with right leg redness and swelling AND darks stools. He was found to have a GI bleed felt to be due to ischemic duodenal ulcers (EGD 3/14). He received a 2 unit prbc transfusion and h/h was then stable att 9-10. He underwent a right femoral-femoral bypass, as well, by Dr. Donnetta Hutching. He developed LEFT LE cellulitis with a calf ulcer and was placed on vanc and cipro thru 3/22.   On 05/10/13, he went out to the ED with concerns about a perirectal abscess. Notes there talk about cellulitis of the left leg, . Cbc, bmp were unremarkabl. CT that was done showed no osteomyelitis, but did reveal a small amount of fluid and gas surrounding the graft   He has had followup with vascular surgery Dr. Donnetta Hutching feels the bypass has been successful and has arranged for followup    He is also seen neurology for complaints of significant leg pain-the cervical spine MRI did not show any cord compression for neurologic note-he is also complaining of some deafness in his left ear neurology did order an MRI to rule out any CVA and  did rule this out--he does have an ENT appointment tomorrow secondary to continue deafness as well as complaints of dizziness.  He recently was started on Antivert as well as antihistamine for his dizziness although this appears to have not helped a whole lot according to patient     \Clinically he appears to have improved he does have less pain he is ambulating with a walker-he will need continued PT and OT as well as CNA support upon  discharge.  His house does not have any steps--apparently has a daughter who lives relatively close by apparently she has her own medical issues including currently being in a wheelchair secondary to leg weakness  In regards to his other issues He was started on Remeron for appetite stimulation he says his appetite is doing a bit better.   He is also on Zoloft for depression--- as well as trazodone which was started for his insomnia -both these appeared to have helped trazodone was recently switched to when necessary .   ROS:   Review of Systems   Constitutional: Positive for  malaise/fatigue. Negative for fever, chills and diaphoresis.   HENT: Negative for congestion--positive for hearing loss left ear.   Eyes: Negative for blurred vision.   Respiratory: Positive for shortness of breath and wheezing this is intermittent he says his breathing remains at baseline he has a history of COPD as when necessary nebulizers as well as inhalers.   Cardiovascular: Negative for chest pain, palpitations and leg swelling.   Gastrointestinal: Negative for abdominal pain, constipation, blood in stool and melena.   Genitourinary: Negative for dysuria, urgency and frequency.   Musculoskeletal: Positive for myalgias and neck pain. Negative for falls.   Skin: Negative for itching and rash.   Neurological: Positive for dizziness, tingling, sensory change and weakness. Negative for tremors, speech change, focal weakness, loss of consciousness and headaches.    Endo/Heme/Allergies: Bruises/bleeds easily.   Psychiatric/Behavioral: Positive for depression.  Negative for suicidal ideas and memory loss. The patient has insomnia currently on trazodone when necessary .   Past Medical History    Diagnosis   Date    .   Hyperlipidemia      .   History of colon polyps      .   Spinal stenosis      .   Anxiety      .   BPH (benign prostatic hypertrophy)      .   ED (erectile dysfunction)      .   Osteoporosis      .    Hypertension      .   Chest pain          "I've had it" (01/31/2012)    .   Peripheral vascular disease          "right leg is 100% blocked; left leg has stent, still ~ 75% blocked" (01/31/2012)    .   Peripheral neuropathy          "bad" (01/31/2012)    .   COPD (chronic obstructive pulmonary disease)      .   Emphysema      .   BRBPR (bright red blood per rectum)          "don't know what it's from; had some this week" (01/31/2012)    .   Chronic lower back pain      .   Pernicious anemia      .   GERD (gastroesophageal reflux disease)      .   Polyneuropathy in other diseases classified elsewhere   11/06/2012    .   Prostate cancer   2004        "had 40 treatments of radiation" (01/31/2012)    .   Breast cancer in male          "left" (01/31/2012)    .   Basal cell carcinoma of nasal tip      .   Pneumonia   1960's        "double"    .   Cervical spondylosis      .   Rheumatoid arthritis(714.0)      .   Arthritis          "all over my body" (04/25/2013)    .   DDD (degenerative disc disease), lumbosacral          "S1; L5" (01/31/2012)    .   Lumbosacral spondylosis without myelopathy   11/06/2012    .   Cellulitis of right leg      .   Depression          "won't take RX though" (04/25/2013)    .   Falls frequently          Past Surgical History    Procedure   Laterality   Date    .   Basal cell carcinoma excision            Nose x 3    .   Cholecystectomy     ?1990's    .   Wrist fusion   Right          "3 OR's; it's fused" (01/31/2012)    .   Excisional hemorrhoidectomy     1960's    .   Knee surgery   Right          6 surgeries; went in  for simple cartilage OR; ended up w/fused knee" (01/31/2012)    .   Iliac artery stent     12-07-09        Stent done by Dr. Gwenlyn Found    .   Total hip arthroplasty     08/16/2011        Procedure: TOTAL HIP ARTHROPLASTY ANTERIOR APPROACH; Surgeon: Mcarthur Rossetti, MD; Location: [facility]; Service: Orthopedics; Laterality: Right; Right  total hip replacement    .   Cervical disc surgery            "7 total; ended w/a fusion" (01/31/2012)    .   Esophagogastroduodenoscopy     02/22/2012        Procedure: ESOPHAGOGASTRODUODENOSCOPY (EGD); Surgeon: Arta Silence, MD; Location: [facility]; Service: Endoscopy; Laterality: N/A;    .   Eus     02/22/2012        Procedure: UPPER ENDOSCOPIC ULTRASOUND (EUS) RADIAL; Surgeon: Arta Silence, MD; Location: [facility]; Service: Endoscopy; Laterality: N/A;    .   Hernia repair        .   Transurethral resection of prostate        .   Aortogram     04/25/2013    .   Esophagogastroduodenoscopy   Left   04/27/2013        Procedure: ESOPHAGOGASTRODUODENOSCOPY (EGD); Surgeon: Arta Silence, MD; Location: [facility] END[facility]; Service: Endoscopy; Laterality: Left;    .   Femoral-femoral bypass graft   Bilateral   05/01/2013        Procedure: BYPASS GRAFT FEMORAL-FEMORAL ARTERY/ LEFT - RIGHT; Surgeon: Rosetta Posner, MD; Location: [facility] OR; Service: Vascular; Laterality: Bilateral;     Social History:   reports that he had been smoking Cigarettes. He has a 126 pack-year smoking history. He has never used smokeless tobacco. He reports that he drinks alcohol. He reports that he does not use illicit drugs.    Family History    Problem   Relation   Age of Onset    .   Hypertension   Mother      .   Cancer   Mother          colon cancer    .   Stroke   Mother      .   Aneurysm   Father          abdominal aortic    .   Heart disease   Brother       Medications:          ALBUTEROL (PROVENTIL) (2.5 MG/3ML) 0.083% NEBULIZER SOLUTION   Take 2.5 mg by nebulization every 4 (four) hours as needed for wheezing or shortness of breath.      ALPRAZOLAM (XANAX) 1 MG TABLET   Take one tablet by mouth three times daily as needed for anxiety or sleep      BISACODYL (DULCOLAX) 10 MG SUPPOSITORY   Place 10 mg rectally as needed for moderate constipation.      FLUTICASONE-SALMETEROL (ADVAIR) 250-50 MCG/DOSE AEPB   Inhale 1  puff into the lungs every 12 (twelve) hours.      HYDROMORPHONE (DILAUDID) 4 MG TABLET   Take one tablet by mouth every four hours moderate pain     HYDROXYZINE (ATARAX/VISTARIL) 25 MG TABLET   Take 25 mg by mouth 2 (two) times daily as needed for anxiety.      MAGNESIUM HYDROXIDE (MILK OF MAGNESIA) 400 MG/5ML SUSPENSION   Take 30  mLs by mouth daily as needed for mild constipation.      PANTOPRAZOLE (PROTONIX) 40 MG TABLET   Take 1 tablet (40 mg total) by mouth 2 (two) times daily before a meal.      PREDNISOLONE 5 MG TABS TABLET   Take 5 mg by mouth daily.      ROSUVASTATIN (CRESTOR) 20 MG TABLET   Take 20 mg by mouth at bedtime.      SERTRALINE (ZOLOFT) 50 MG TABLET   Take 1 tablet (50 mg total) by mouth daily.      SODIUM PHOSPHATES (RA SALINE ENEMA RE)   Place 1 enema rectally daily as needed (severe constipation).         Remeron 7.5 mg   . 1 tablet by mouth daily      TIOTROPIUM (SPIRIVA) 18 MCG INHALATION CAPSULE   Place 1 capsule (18 mcg total) into inhaler and inhale daily.      TRAZODONE (DESYREL) 50 MG TABLET   Take 50 mg by mouth at bedtime.prn Antivert-25 mg daily when necessary dizziness         Physical Exam Temperature 98.1 pulse 62 respirations 18 blood pressure 122/68--O2 saturations continue to be in the 90s on room air    Constitutional: He is oriented to person, place, and time. No distress.  Cachectic white male d HENT:   Head: Normocephalic and atraumatic.  Right Ear: External ear normal.  Left Ear: External ear normal.   Nose: Nose normal.   Mouth/Throat: Oropharynx is clear and moist. No oropharyngeal exudate.   Eyes: Conjunctivae and EOM are normal. Pupils are equal, round, and reactive to light. No scleral icterus he has prescription lenses.    Neck:.   has decreased ROM due to prior fusion surgeries, several scars on his neck   Cardiovascular: Normal rate, regular rhythm and normal heart sounds R. sounds are distant .      Pulmonary/Chest: Effort  normal. No respiratory distress--shallow air entry. He has no rales.  Oxygen recently discontinued nonetheless this has been stable with O2 sats in the 90s   Abdominal: Soft. Bowel sounds are normal. He exhibits no distension and no mass. There isminimal tenderness.  B  Musculoskeletal: Normal range of motion. He exhibits tenderness. Frailty he does have a lower right shin area covered with dry dressing--he is able to stand without assistance and ambulate with a walker he has made significant progress in this regard although still has some weakness and would benefit from therapy  Neurological: He is alert and oriented to person, place, and time. No cranial nerve deficit.   Skin: Skin is warm and dry. He is not diaphoretic.     Psychiatric: He has a normal mood and affect. His behavior is normal. Judgment and thought content normal--appears mildly anxious at times.     Labs reviewed:   06/27/2013.  Prealbumin 12.67.  06/13/2013.  Sodium 139 potassium 3.9 BUN 16 creatinine 0.8.  WBC 5.1 hemoglobin 10.4 platelets 158.  06/11/2013.  Albumin 2.9 otherwise liver function tests within normal limits.  Cholesterol 102 HDL 36 LDL 47 triglycerides 97  Basic Metabolic Panel:   Recent Labs     05/13/2013.   WBC 6.6 hemoglobin 10.0 platelets 221.   Sodium 136 potassium 4.5 BUN 13 creatinine 0.96      04/26/13 0603     05/06/13 0506   05/07/13 0555   05/10/13 1121    NA   141   < >   135*  139   136*    K   2.7*   < >   5.6*   4.0   4.9    CL   105   < >   100   98   97    CO2   25   < >   26   33*   29    GLUCOSE   152*   < >   89   89   112*    BUN   25*   < >   10   11   12     CREATININE   0.70   < >   0.76   0.87   0.91    CALCIUM   8.4   < >   8.2*   8.6   8.9    MG   1.8   --   --   --   --     < > = values in this interval not displayed.   Liver Function Tests:   Recent Labs     04/21/13 0615    AST   44*    ALT   10    ALKPHOS   79    BILITOT   1.2    PROT    5.9*    ALBUMIN   2.5*     CBC:   Recent Labs     05/06/13 0810   05/07/13 0555   05/10/13 1121    WBC   8.1   5.5   8.0    HGB   10.7*   10.3*   11.0*    HCT   31.6*   30.9*   32.7*    MCV   96.3   97.8   95.9    PLT   163   174   PLATELET CLUMPS NOTED ON SMEAR, COUNT APPEARS ADEQUATE     wound culture 05/10/13 perianal area: Gram Stain   ABUNDANT WBC PRESENT,BOTH PMN AND MONONUCLEAR RARE SQUAMOUS EPITHELIAL CELLS PRESENT MODERATE GRAM POSITIVE COCCI IN PAIRS FEW GRAM POSITIVE RODS RARE GRAM NEGATIVE RODS     Culture   MULTIPLE ORGANISMS PRESENT, NONE PREDOMINANT Note: NO STAPHYLOCOCCUS AUREUS ISOLATED NO GROUP A STREP (S.PYOGENES) ISOLATED     Imaging and Procedures reviewed:   EGD 3/14   CT 3/27     Assessment/Plan   1. Chronic total occlusion of artery of the extremities   -of right leg--pt says nothing can be done for this leg   -s/p fem/fem bypass   -pain persists--but he says that Dilaudid is helping    -f/u with Dr. Donnetta Hutching as needed   2. PVD (peripheral vascular disease)   -severe as in #1, s/p fem fem bypass--per Dr. Donnetta Hutching this was successful       3. COPD (chronic obstructive pulmonary disease)  initially oxygen dependent but O2 sats continue be in 90s on room air   -cont spiriva, prednisone, advair and albutero l   4. GI bleed   -f/u cbc, metabolic panel these have been stable recently   -remains weak and dizzy at times    5. Duodenal ulcer   -felt to possibly be ischemic with his poor circulation   -has h/o steroid use for RA and COPD which do not help   -cont protonix--we'll update CBC    6. Protein-calorie malnutrition, severe      -on remeron 7.5mg  po qhs to help with appetite, mood and  sleep--he says the Remeron is helping improve his appetite--we'll update metabolic panel before discharge    7. Perirectal abscess   -He has completed antibiotic this is followed by wound care    8. Rheumatoid arthritis   -by history; has been on steroids  longstanding and this has caused immunosuppression --is on prednisone we'll check a fasting glucose tomorrow          9. Cervical spondylosis   -discussed with patient bellieves many of his symptoms including his frequent falls, weakness, and pain are likely coming from his neck    Neurology has ordered MRI which did not show cord compression-apparently symptoms are somewhat better he continues on Neurontin   10 -- depression-he continues on Zoloft.   #11-history of insomnia-he is now on trazodoneprn  #12-history of dizziness-left ear perihepatic-he does have an ENT consult pending for tomorrow-he continues on Antivert as needed as well as anti-histamines daily  Patient will be going to his house which has no steps to as he will need CNA support for his multiple medical issues-as well as PT and OT.  Also will need a bedside commode and shower bench secondary to his continued weakness and fall risk.  Also will need expedient follow up with primary care provider as well as neurology and Dr.Early-vascular as needed  [id number] note greater than 30 minutes spent preparing this discharge summary

## 2013-07-25 ENCOUNTER — Telehealth: Payer: Self-pay | Admitting: *Deleted

## 2013-07-25 ENCOUNTER — Other Ambulatory Visit: Payer: Self-pay | Admitting: Neurology

## 2013-07-25 MED ORDER — OXYCODONE HCL 30 MG PO TABS: 30.0000 mg | ORAL_TABLET | ORAL | Status: DC | PRN

## 2013-07-25 NOTE — Telephone Encounter (Signed)
Called pt to inform him that his Rx was ready to be picked up at the front desk. Pt stated that his friend would be picking his Rx up for him, by the name of Sherald Barge. I advised the pt the if he has any other problems, questions or concerns to call the office. Pt verbalized understanding.

## 2013-08-01 ENCOUNTER — Telehealth: Payer: Self-pay

## 2013-08-01 ENCOUNTER — Telehealth: Payer: Self-pay | Admitting: Vascular Surgery

## 2013-08-01 DIAGNOSIS — M79661 Pain in right lower leg: Secondary | ICD-10-CM

## 2013-08-01 DIAGNOSIS — I739 Peripheral vascular disease, unspecified: Secondary | ICD-10-CM

## 2013-08-01 NOTE — Telephone Encounter (Signed)
We could move [name] appointment up to 08/06/13 and allow time for ABIs. I have left a message for [name] to call me back regarding this reschedule, dpm

## 2013-08-01 NOTE — Telephone Encounter (Signed)
Patient returned my call. Due to his increase in pain and redness of his LE, I offered to schedule him for 08/06/2013 @ 9:00am. [name] stated that there was no way he could make a morning appointment. I looked through the schedule and the only pm appt I could offer was 08/20/13 @ 2:30. He decided on the 07/07 appointment because he just absolutely can not make any appointments before lunch.

## 2013-08-01 NOTE — Telephone Encounter (Signed)
Phone call from pt.  Requested f/u appt. to be moved to an earlier date. Reported he was released from  Rehab 1 week ago, after 3 mos. Of treatment.  Stated he thinks he is having problems with his right leg.  Describes red spots on his lower legs; (R) >(L).  Denies any open sores.  Denies any redness or tenderness of the red spots.  Stated the red areas are not raised.  Reports has some swelling in the right foot to up, above the ankle.  Stated "the swelling isn't too bad."  "It goes down some at night."  Questioned about pain with walking or with rest; stated "I am in a wheelchair, and don't really walk much, because I'm unsteady."  Reported the leg pain is increased since he left Rehab last week, and is there most of the time. Stated he spent so much time in the hospital and in [place], and would like to be evaluated before his situation becomes worse.  Advised to expect a phone call re: rescheduling his f/u appt. with Dr. Donnetta Hutching.

## 2013-08-06 ENCOUNTER — Encounter (HOSPITAL_COMMUNITY): Payer: Medicare Other

## 2013-08-06 ENCOUNTER — Ambulatory Visit: Payer: Medicare Other | Admitting: Vascular Surgery

## 2013-08-19 ENCOUNTER — Telehealth: Payer: Self-pay | Admitting: Neurology

## 2013-08-19 ENCOUNTER — Encounter: Payer: Self-pay | Admitting: Vascular Surgery

## 2013-08-19 ENCOUNTER — Other Ambulatory Visit: Payer: Self-pay

## 2013-08-19 MED ORDER — OXYCODONE HCL 30 MG PO TABS: 30.0000 mg | ORAL_TABLET | ORAL | Status: DC | PRN

## 2013-08-19 NOTE — Telephone Encounter (Signed)
I am unable to address the sooner appt, however message was forwarded to Triage regarding this.  Refill request sent to [facility] since Dr Jannifer Franklin is out of the office.

## 2013-08-19 NOTE — Telephone Encounter (Signed)
Patient calling to request Oxycodone refill, states it's not due to be filled until Wednesday but he has an appointment tomorrow and will be in the area, is wondering if he can pick it up. Patient also would like to schedule a sooner appointment with Dr. Jannifer Franklin because he's having leg and back problems. Please return call to patient and advise.

## 2013-08-19 NOTE — Telephone Encounter (Signed)
Dr Jannifer Franklin is out of the office, forwarding request to [facility] for review.

## 2013-08-20 ENCOUNTER — Encounter: Payer: Self-pay | Admitting: Vascular Surgery

## 2013-08-20 ENCOUNTER — Ambulatory Visit: Payer: Medicare Other | Admitting: Vascular Surgery

## 2013-08-20 ENCOUNTER — Ambulatory Visit (HOSPITAL_COMMUNITY)
Admission: RE | Admit: 2013-08-20 | Discharge: 2013-08-20 | Disposition: A | Discharge status: Home / Self Care | Payer: Medicare Other | Source: Ambulatory Visit | Attending: Vascular Surgery | Admitting: Vascular Surgery

## 2013-08-20 ENCOUNTER — Ambulatory Visit (INDEPENDENT_AMBULATORY_CARE_PROVIDER_SITE_OTHER): Payer: Medicare Other | Admitting: Vascular Surgery

## 2013-08-20 VITALS — BP 111/67 | HR 105 | Resp 16 | Ht 68.0 in | Wt 122.8 lb

## 2013-08-20 DIAGNOSIS — M79609 Pain in unspecified limb: Secondary | ICD-10-CM

## 2013-08-20 DIAGNOSIS — I739 Peripheral vascular disease, unspecified: Secondary | ICD-10-CM

## 2013-08-20 DIAGNOSIS — M79661 Pain in right lower leg: Secondary | ICD-10-CM

## 2013-08-20 NOTE — Telephone Encounter (Signed)
Called pt to inform him that his Rx was ready to be picked up at the front desk and if she has any other problems, questions or concerns to call the office. Pt verbalized understanding.

## 2013-08-20 NOTE — Progress Notes (Signed)
Here today for followup of his left to right fem-fem bypass. He had presented with severe ischemia and extensive rest pain in his calf. He underwent left right fem-fem bypass for occluded right iliac artery and 2013. He's had prolonged rehabilitation stay following this but is actually a finally healed his very large decubitus on his posterior right calf. He has had multiple difficulties with chronic pain. He reports the recently had reevaluation for his back and was told that he had 2 disc that were completely degenerated with no treatment options.  Past Medical History  Diagnosis Date  . Hyperlipidemia   . History of colon polyps   . Spinal stenosis   . Anxiety   . BPH (benign prostatic hypertrophy)   . ED (erectile dysfunction)   . Osteoporosis   . Hypertension   . Chest pain     "I've had it" (01/31/2012)   . Peripheral vascular disease     "right leg is 100% blocked; left leg has stent, still ~ 75% blocked" (01/31/2012)  . Peripheral neuropathy     "bad" (01/31/2012)  . COPD (chronic obstructive pulmonary disease)   . Emphysema   . BRBPR (bright red blood per rectum)     "don't know what it's from; had some this week" (01/31/2012)  . Chronic lower back pain   . Pernicious anemia   . GERD (gastroesophageal reflux disease)   . Polyneuropathy in other diseases classified elsewhere 11/06/2012  . Prostate cancer 2004    "had 40 treatments of radiation" (01/31/2012)  . Breast cancer in male     "left" (01/31/2012)  . Basal cell carcinoma of nasal tip   . Pneumonia 1960's    "double"   . Cervical spondylosis   . Rheumatoid arthritis(714.0)   . Arthritis     "all over my body" (04/25/2013)  . DDD (degenerative disc disease), lumbosacral     "S1; L5" (01/31/2012)  . Lumbosacral spondylosis without myelopathy 11/06/2012  . Cellulitis of right leg   . Depression     "won't take RX though" (04/25/2013)  . Falls frequently     History  Substance Use Topics  . Smoking status:  Former Smoker -- 2.00 packs/day for 63 years    Types: Cigarettes    Quit date: 04/30/2013  . Smokeless tobacco: Never Used  . Alcohol Use: Yes     Comment: 04/25/2013 "used to be a heavy drinker; stopped drinking in 1975"    Family History  Problem Relation Age of Onset  . Hypertension Mother   . Cancer Mother     colon cancer  . Stroke Mother   . Aneurysm Father     abdominal aortic  . Heart disease Brother     Allergies  Allergen Reactions  . Lipitor [Atorvastatin Calcium] Other (See Comments)    Myalgias "don't remember how bad" (01/31/2012)  . Penicillins Other (See Comments)    Passed out    Current outpatient prescriptions:albuterol (PROVENTIL) (2.5 MG/3ML) 0.083% nebulizer solution, Take 2.5 mg by nebulization every 4 (four) hours as needed for wheezing or shortness of breath., Disp: , Rfl: ;  ALPRAZolam (XANAX) 1 MG tablet, Take one tablet by mouth three times daily as needed for anxiety or sleep, Disp: 90 tablet, Rfl: 5 Fluticasone-Salmeterol (ADVAIR) 250-50 MCG/DOSE AEPB, Inhale 1 puff into the lungs every 12 (twelve) hours., Disp: 2 each, Rfl: 0;  gabapentin (NEURONTIN) 100 MG capsule, Take 100 mg by mouth 3 (three) times daily., Disp: , Rfl: ;  hydrOXYzine (ATARAX/VISTARIL)  25 MG tablet, Take 25 mg by mouth 2 (two) times daily as needed for anxiety., Disp: , Rfl:  oxycodone (ROXICODONE) 30 MG immediate release tablet, Take 1 tablet (30 mg total) by mouth every 4 (four) hours as needed for pain (Must Last 28 days)., Disp: 200 tablet, Rfl: 0;  prednisoLONE 5 MG TABS tablet, Take 5 mg by mouth daily., Disp: , Rfl: ;  bisacodyl (DULCOLAX) 10 MG suppository, Place 10 mg rectally as needed for moderate constipation., Disp: , Rfl:  magnesium hydroxide (MILK OF MAGNESIA) 400 MG/5ML suspension, Take 30 mLs by mouth daily as needed for mild constipation., Disp: , Rfl: ;  meclizine (ANTIVERT) 25 MG tablet, Take 25 mg by mouth daily as needed for dizziness., Disp: , Rfl: ;  mirtazapine  (REMERON) 7.5 MG tablet, Take 7.5 mg by mouth at bedtime., Disp: , Rfl:  pantoprazole (PROTONIX) 40 MG tablet, Take 1 tablet (40 mg total) by mouth 2 (two) times daily before a meal., Disp: 60 tablet, Rfl: 1;  rosuvastatin (CRESTOR) 20 MG tablet, Take 20 mg by mouth at bedtime. , Disp: , Rfl: ;  sertraline (ZOLOFT) 50 MG tablet, Take 1 tablet (50 mg total) by mouth daily., Disp: 30 tablet, Rfl: 0;  Sodium Phosphates (RA SALINE ENEMA RE), Place 1 enema rectally daily as needed (severe constipation)., Disp: , Rfl:  tiotropium (SPIRIVA) 18 MCG inhalation capsule, Place 1 capsule (18 mcg total) into inhaler and inhale daily., Disp: 20 capsule, Rfl: 0;  traZODone (DESYREL) 50 MG tablet, Take 50 mg by mouth at bedtime as needed. , Disp: , Rfl: ;  vitamin C (ASCORBIC ACID) 500 MG tablet, Take 500 mg by mouth 2 (two) times daily., Disp: , Rfl: ;  zinc sulfate 220 MG capsule, Take 220 mg by mouth daily., Disp: , Rfl:   BP 111/67  Pulse 105  Resp 16  Ht 5\' 8"  (1.727 m)  Wt 122 lb 12.8 oz (55.702 kg)  BMI 18.68 kg/m2  Body mass index is 18.68 kg/(m^2).       Physical exam he is a frail-appearing gentleman in distress from chronic pain with his back. His difficult time becoming comfortable in the exam room. His groin incisions well healed without evidence of false aneurysm and he has 2+ femoral and 2+ graft pulse. He does have 2+ right popliteal pulse and 1+ dorsalis pedis pulse. He does have erythema in his right calf but no open ulceration and no evidence of infection.   Noninvasive vascular laboratory study today revealing arm index of near normal at 0.87 on the right today.  Impression and plan stable lower surety bypass. Chronic pain remains being the main issue for him. We will continue to see him in one year for followup of his bypass

## 2013-08-22 ENCOUNTER — Ambulatory Visit (INDEPENDENT_AMBULATORY_CARE_PROVIDER_SITE_OTHER): Payer: Medicare Other | Admitting: Neurology

## 2013-08-22 ENCOUNTER — Encounter: Payer: Self-pay | Admitting: Neurology

## 2013-08-22 VITALS — BP 122/62 | HR 100 | Wt 122.0 lb

## 2013-08-22 DIAGNOSIS — G63 Polyneuropathy in diseases classified elsewhere: Secondary | ICD-10-CM

## 2013-08-22 DIAGNOSIS — M47817 Spondylosis without myelopathy or radiculopathy, lumbosacral region: Secondary | ICD-10-CM

## 2013-08-22 NOTE — Patient Instructions (Signed)

## 2013-08-22 NOTE — Progress Notes (Signed)
Reason for visit: Chronic pain syndrome  [name] is an 79 y.o. male  History of present illness:  [name] is a 79 year old right-handed white male with a history of a peripheral neuropathy and a peripheral vascular disease. The patient has significant ischemia to the right leg. He has low back pain, neck pain, and headache. The patient is on oxycodone 30 mg 4 times daily, and he is still complaining of significant pain issues. The patient has returned to the home environment. The patient is having some swelling involving the right leg, and his low back pain has significantly worsened. The patient is followed through Dr. Oneita Kras from orthopedic surgery, and he may be referred to a pain center. He was told he had 2 discs in his back causing his pain. The patient has had a partial tendon rupture of the left biceps muscle as well. The patient has not had any recent falls, he uses a walker for ambulation. He continues to have some slight dizziness, but this has improved, but the deafness in the left ear has not. The patient will be seen by an ENT physician in the near future. He returns for an evaluation.  Past Medical History  Diagnosis Date  . Hyperlipidemia   . History of colon polyps   . Spinal stenosis   . Anxiety   . BPH (benign prostatic hypertrophy)   . ED (erectile dysfunction)   . Osteoporosis   . Hypertension   . Chest pain     "I've had it" (01/31/2012)   . Peripheral vascular disease     "right leg is 100% blocked; left leg has stent, still ~ 75% blocked" (01/31/2012)  . Peripheral neuropathy     "bad" (01/31/2012)  . COPD (chronic obstructive pulmonary disease)   . Emphysema   . BRBPR (bright red blood per rectum)     "don't know what it's from; had some this week" (01/31/2012)  . Chronic lower back pain   . Pernicious anemia   . GERD (gastroesophageal reflux disease)   . Polyneuropathy in other diseases classified elsewhere 11/06/2012  . Prostate cancer 2004   "had 40 treatments of radiation" (01/31/2012)  . Breast cancer in male     "left" (01/31/2012)  . Basal cell carcinoma of nasal tip   . Pneumonia 1960's    "double"   . Cervical spondylosis   . Rheumatoid arthritis(714.0)   . Arthritis     "all over my body" (04/25/2013)  . DDD (degenerative disc disease), lumbosacral     "S1; L5" (01/31/2012)  . Lumbosacral spondylosis without myelopathy 11/06/2012  . Cellulitis of right leg   . Depression     "won't take RX though" (04/25/2013)  . Falls frequently     Past Surgical History  Procedure Laterality Date  . Basal cell carcinoma excision      Nose x 3  . Cholecystectomy  ?1990's  . Wrist fusion Right     "3 OR's; it's fused" (01/31/2012)  . Excisional hemorrhoidectomy  1960's  . Knee surgery Right      6 surgeries; went in for simple cartilage OR; ended up w/fused knee" (01/31/2012)  . Iliac artery stent  12-07-09    Stent done by Dr. Gwenlyn Found  . Total hip arthroplasty  08/16/2011    Procedure: TOTAL HIP ARTHROPLASTY ANTERIOR APPROACH;  Surgeon: Mcarthur Rossetti, MD;  Location: [facility];  Service: Orthopedics;  Laterality: Right;  Right total hip replacement  . Cervical disc surgery      "  7 total; ended w/a fusion" (01/31/2012)  . Esophagogastroduodenoscopy  02/22/2012    Procedure: ESOPHAGOGASTRODUODENOSCOPY (EGD);  Surgeon: Arta Silence, MD;  Location: [facility];  Service: Endoscopy;  Laterality: N/A;  . Eus  02/22/2012    Procedure: UPPER ENDOSCOPIC ULTRASOUND (EUS) RADIAL;  Surgeon: Arta Silence, MD;  Location: [facility];  Service: Endoscopy;  Laterality: N/A;  . Hernia repair    . Transurethral resection of prostate    . Aortogram  04/25/2013  . Esophagogastroduodenoscopy Left 04/27/2013    Procedure: ESOPHAGOGASTRODUODENOSCOPY (EGD);  Surgeon: Arta Silence, MD;  Location: [facility] ENDOSCOPY;  Service: Endoscopy;  Laterality: Left;  . Femoral-femoral bypass graft Bilateral 05/01/2013    Procedure: BYPASS GRAFT FEMORAL-FEMORAL  ARTERY/ LEFT - RIGHT;  Surgeon: Rosetta Posner, MD;  Location: [facility] OR;  Service: Vascular;  Laterality: Bilateral;    Family History  Problem Relation Age of Onset  . Hypertension Mother   . Cancer Mother     colon cancer  . Stroke Mother   . Aneurysm Father     abdominal aortic  . Heart disease Brother     Social history:  reports that he quit smoking about 3 months ago. His smoking use included Cigarettes. He has a 126 pack-year smoking history. He has never used smokeless tobacco. He reports that he drinks alcohol. He reports that he does not use illicit drugs.    Allergies  Allergen Reactions  . Lipitor [Atorvastatin Calcium] Other (See Comments)    Myalgias "don't remember how bad" (01/31/2012)  . Penicillins Other (See Comments)    Passed out    Medications:  Current Outpatient Prescriptions on File Prior to Visit  Medication Sig Dispense Refill  . albuterol (PROVENTIL) (2.5 MG/3ML) 0.083% nebulizer solution Take 2.5 mg by nebulization every 4 (four) hours as needed for wheezing or shortness of breath.      . ALPRAZolam (XANAX) 1 MG tablet Take one tablet by mouth three times daily as needed for anxiety or sleep  90 tablet  5  . Fluticasone-Salmeterol (ADVAIR) 250-50 MCG/DOSE AEPB Inhale 1 puff into the lungs every 12 (twelve) hours.  2 each  0  . meclizine (ANTIVERT) 25 MG tablet Take 25 mg by mouth daily as needed for dizziness.      . mirtazapine (REMERON) 7.5 MG tablet Take 7.5 mg by mouth at bedtime.      Marland Kitchen oxycodone (ROXICODONE) 30 MG immediate release tablet Take 1 tablet (30 mg total) by mouth every 4 (four) hours as needed for pain (Must Last 28 days).  200 tablet  0  . prednisoLONE 5 MG TABS tablet Take 5 mg by mouth daily.      . rosuvastatin (CRESTOR) 20 MG tablet Take 20 mg by mouth at bedtime.       Marland Kitchen tiotropium (SPIRIVA) 18 MCG inhalation capsule Place 1 capsule (18 mcg total) into inhaler and inhale daily.  20 capsule  0  . traZODone (DESYREL) 50 MG tablet  Take 50 mg by mouth at bedtime as needed.        No current facility-administered medications on file prior to visit.    ROS:  Out of a complete 14 system review of symptoms, the patient complains only of the following symptoms, and all other reviewed systems are negative.  Appetite change, fatigue Hearing loss, left ear Blurred vision Shortness of breath Right leg swelling Cold intolerance, excessive thirst Frequency of urination, penile discharge Joint pain, joint swelling, back pain, achy muscles, muscle cramps, walking difficulties Wounds  of the skin Bruising easily Weakness Depression  Blood pressure 122/62, pulse 100, weight 122 lb (55.339 kg).  Physical Exam  General: The patient is alert and cooperative at the time of the examination.  Skin: No significant peripheral edema is noted on the left leg, 1-2+ edema below the knee is seen on the right. The right knee is fused.   Neurologic Exam  Mental status: The patient is oriented x 3.  Cranial nerves: Facial symmetry is present. Speech is normal, no aphasia or dysarthria is noted. Extraocular movements are full. Visual fields are full.  Motor: The patient has good strength in all 4 extremities, with the exception that there is some weakness with elbow flexion on the left. Tendon rupture on the lateral head of the biceps muscle is seen.  Sensory examination: Soft touch sensation is symmetric on the face, arms, and legs.  Coordination: The patient has good finger-nose-finger bilaterally.  Gait and station: The patient walks with a walker, has good stability with this. Tandem gait was not attempted. Romberg is negative. No drift is seen.  Reflexes: Deep tendon reflexes are symmetric.   Assessment/Plan:  1. Chronic pain syndrome, neck, back, peripheral neuropathy pain  2. Gait disorder  3. Peripheral vascular disease  The patient currently is on oxycodone, we will switch him to a fentanyl patch in August of  2015. The patient will switch to the 10 mg oxycodone tablets for breakthrough pain at that time. His pain levels are quite significant at this time. His orthopedic surgeon will refer him to a pain center. He will followup through this office in 6 months.  Jill Alexanders MD 08/22/2013 7:51 PM  [facility] [address] [place], [place] [address]  Phone [phone] Fax [phone]

## 2013-08-26 ENCOUNTER — Telehealth: Payer: Self-pay | Admitting: Neurology

## 2013-08-26 NOTE — Telephone Encounter (Signed)
[name] with [facility] requesting Rx for fentaNYL (DURAGESIC - DOSED MCG/HR) 50 MCG/HR.  Per patient last ov Dr. Jannifer Franklin was prescribing medication, but did not get Rx.  Please call and advise.

## 2013-08-26 NOTE — Telephone Encounter (Signed)
Last OV note says: The patient currently is on oxycodone, we will switch him to a fentanyl patch in August of 2015.  I called back.  Relayed info from OV note.  They verbalized understanding and will call us back if anything further is needed.

## 2013-09-03 ENCOUNTER — Telehealth: Payer: Self-pay | Admitting: Neurology

## 2013-09-03 NOTE — Telephone Encounter (Signed)
Patient calling to state that he has been to the pain management doctor as suggested by Dr. Jannifer Franklin but is wondering if he needs to schedule a sooner follow up visit with Dr. Jannifer Franklin to see how he's doing, please return call to patient and advise.

## 2013-09-04 NOTE — Telephone Encounter (Signed)
Per last office note on 08/22/13 patient will be followed by the pain center's doctor. He does not need appointment unless he is having more problems. He will follow up in 6 months.

## 2013-09-17 ENCOUNTER — Telehealth: Payer: Self-pay | Admitting: Neurology

## 2013-09-17 ENCOUNTER — Other Ambulatory Visit: Payer: Self-pay

## 2013-09-17 MED ORDER — OXYCODONE HCL 10 MG PO TABS: 10.0000 mg | ORAL_TABLET | Freq: Four times a day (QID) | ORAL | Status: DC | PRN

## 2013-09-17 MED ORDER — FENTANYL 50 MCG/HR TD PT72: 50.0000 ug | MEDICATED_PATCH | TRANSDERMAL | Status: DC

## 2013-09-17 NOTE — Telephone Encounter (Signed)
Per last OV note, patient will be changd from Oxycodone to Fentanyl.

## 2013-09-17 NOTE — Telephone Encounter (Signed)
Called pt and left message informing pt that his Rx was ready to be picked up at the front desk and if she has any other problems, questions or concerns to call the office. Pt verbalized understanding.

## 2013-09-17 NOTE — Telephone Encounter (Signed)
I spoke with the patient who said he does have an appt scheduled at the pain clinic later this month and would like to get the 10mg  Oxycodone refilled until he is able to see that provider.  Since we have not prescribed the 10mg  tabs, was unsure what quantity should be dispensed.  Please advise.  Thank you.    Last Ov says: The patient currently is on oxycodone, we will switch him to a fentanyl patch in August of 2015. The patient will switch to the 10 mg oxycodone tablets for breakthrough pain at that time.

## 2013-09-17 NOTE — Telephone Encounter (Signed)
I will write the prescription.

## 2013-09-17 NOTE — Telephone Encounter (Signed)
Patient requesting refill of script oxycodone to pick up, please call patient and advise.

## 2013-09-17 NOTE — Telephone Encounter (Signed)
Previous message, I left a message, pt did not verbalized, only message was left.

## 2013-09-18 NOTE — Telephone Encounter (Signed)
Called pt and left message informing him that his Rx was ready to be picked up at the front desk and if he has any other problems, questions or concerns to call the office. °

## 2013-09-24 ENCOUNTER — Telehealth: Payer: Self-pay | Admitting: Neurology

## 2013-09-24 NOTE — Telephone Encounter (Signed)
Patient calling to state that the Fentanyl patches he was on is making him sick so he had to stop using them, patient seeking advice, advised patient that DR. Jannifer Franklin will be out of office until 8/19. Please return call and advise.

## 2013-09-24 NOTE — Telephone Encounter (Signed)
Patient d/c Fentyl patch. He is having nausea/vomiting. He had been on the patch a week and a half. He has been off patch for 2 days. Please advise.

## 2013-09-25 ENCOUNTER — Other Ambulatory Visit: Payer: Self-pay | Admitting: Neurology

## 2013-09-25 MED ORDER — OXYCODONE HCL 30 MG PO TABS: 30.0000 mg | ORAL_TABLET | ORAL | Status: DC | PRN

## 2013-09-25 NOTE — Telephone Encounter (Signed)
Returned call. Discussed concerns with patient. Suggested lower dose of fentanyl patch but he does not wish to try it again due to nausea and emesis. He states he has received benefit from dilaudid in the past, counseled him that since I have never evaluated him I would not start a new narcotic. He did note benefit from roxicodone 30mg  in the past, which he was on prior to starting fentanyl patch. Will give him 1 week supply of roxicodone and defer long term pain management to Dr Jannifer Franklin upon his return.

## 2013-09-25 NOTE — Telephone Encounter (Signed)
Please consult Mr. Pelc with d/c of Fentyl patch. Thanks

## 2013-09-25 NOTE — Telephone Encounter (Signed)
This call came often office hours , please direct to [facility] this AM.

## 2013-10-01 ENCOUNTER — Telehealth: Payer: Self-pay | Admitting: Neurology

## 2013-10-01 MED ORDER — OXYCODONE HCL 30 MG PO TABS: 30.0000 mg | ORAL_TABLET | ORAL | Status: DC | PRN

## 2013-10-01 NOTE — Telephone Encounter (Signed)
I called patient. The no was not well-tolerated, caused a lot of nausea and vomiting. The patient has stopped the fentanyl patch, and he will go back on the oxycodone 30 mg every 4 hours as needed. I'll write a prescription for this, as he was only given 30 tablets last week.

## 2013-10-02 ENCOUNTER — Other Ambulatory Visit: Payer: Self-pay | Admitting: Otolaryngology

## 2013-10-02 DIAGNOSIS — D333 Benign neoplasm of cranial nerves: Secondary | ICD-10-CM

## 2013-10-02 DIAGNOSIS — H905 Unspecified sensorineural hearing loss: Secondary | ICD-10-CM

## 2013-10-02 DIAGNOSIS — H903 Sensorineural hearing loss, bilateral: Secondary | ICD-10-CM

## 2013-10-02 NOTE — Telephone Encounter (Signed)
Called and LMVM for pt on cell # that script ready for pick up.

## 2013-10-03 ENCOUNTER — Other Ambulatory Visit: Payer: Self-pay

## 2013-10-03 DIAGNOSIS — I739 Peripheral vascular disease, unspecified: Secondary | ICD-10-CM

## 2013-10-03 DIAGNOSIS — Z9889 Other specified postprocedural states: Secondary | ICD-10-CM

## 2013-10-03 DIAGNOSIS — L98499 Non-pressure chronic ulcer of skin of other sites with unspecified severity: Principal | ICD-10-CM

## 2013-10-07 ENCOUNTER — Encounter: Payer: Self-pay | Admitting: Vascular Surgery

## 2013-10-08 ENCOUNTER — Ambulatory Visit
Admission: RE | Admit: 2013-10-08 | Discharge: 2013-10-08 | Disposition: A | Discharge status: Home / Self Care | Payer: Medicare Other | Source: Ambulatory Visit | Attending: Otolaryngology | Admitting: Otolaryngology

## 2013-10-08 ENCOUNTER — Other Ambulatory Visit: Payer: Self-pay | Admitting: Vascular Surgery

## 2013-10-08 ENCOUNTER — Ambulatory Visit (INDEPENDENT_AMBULATORY_CARE_PROVIDER_SITE_OTHER)
Admission: RE | Admit: 2013-10-08 | Discharge: 2013-10-08 | Disposition: A | Discharge status: Home / Self Care | Payer: Medicare Other | Source: Ambulatory Visit | Attending: Vascular Surgery | Admitting: Vascular Surgery

## 2013-10-08 ENCOUNTER — Ambulatory Visit (INDEPENDENT_AMBULATORY_CARE_PROVIDER_SITE_OTHER): Payer: Medicare Other | Admitting: Vascular Surgery

## 2013-10-08 ENCOUNTER — Encounter: Payer: Self-pay | Admitting: Vascular Surgery

## 2013-10-08 ENCOUNTER — Ambulatory Visit (HOSPITAL_COMMUNITY)
Admission: RE | Admit: 2013-10-08 | Discharge: 2013-10-08 | Disposition: A | Discharge status: Home / Self Care | Payer: Medicare Other | Source: Ambulatory Visit | Attending: Vascular Surgery | Admitting: Vascular Surgery

## 2013-10-08 VITALS — BP 173/71 | HR 83 | Resp 18 | Ht 69.0 in | Wt 117.4 lb

## 2013-10-08 DIAGNOSIS — Z9889 Other specified postprocedural states: Secondary | ICD-10-CM

## 2013-10-08 DIAGNOSIS — H903 Sensorineural hearing loss, bilateral: Secondary | ICD-10-CM

## 2013-10-08 DIAGNOSIS — L98499 Non-pressure chronic ulcer of skin of other sites with unspecified severity: Secondary | ICD-10-CM | POA: Diagnosis present

## 2013-10-08 DIAGNOSIS — I739 Peripheral vascular disease, unspecified: Secondary | ICD-10-CM | POA: Diagnosis not present

## 2013-10-08 DIAGNOSIS — I70209 Unspecified atherosclerosis of native arteries of extremities, unspecified extremity: Secondary | ICD-10-CM | POA: Insufficient documentation

## 2013-10-08 DIAGNOSIS — D333 Benign neoplasm of cranial nerves: Secondary | ICD-10-CM

## 2013-10-08 DIAGNOSIS — H905 Unspecified sensorineural hearing loss: Secondary | ICD-10-CM

## 2013-10-08 MED ORDER — GADOBENATE DIMEGLUMINE 529 MG/ML IV SOLN
10.0000 mL | Freq: Once | INTRAVENOUS | Status: AC | PRN
  Administered 2013-10-08: 10 mL via INTRAVENOUS

## 2013-10-08 NOTE — Progress Notes (Signed)
The patient presents today for evaluation of his right lower extremity. He is well known to me from a left to right fem-fem bypass on 05/01/2013. He has critical limb ischemia with extensive alterations over his right leg. He has made remarkable recovery from this ulceration. He does have severe difficulty with chronic pain mainly from degenerative disc disease. He is seen today for concern regarding potential worsening of the ulceration on his right posterior calf.  Past Medical History  Diagnosis Date  . Hyperlipidemia   . History of colon polyps   . Spinal stenosis   . Anxiety   . BPH (benign prostatic hypertrophy)   . ED (erectile dysfunction)   . Osteoporosis   . Hypertension   . Chest pain     "I've had it" (01/31/2012)   . Peripheral vascular disease     "right leg is 100% blocked; left leg has stent, still ~ 75% blocked" (01/31/2012)  . Peripheral neuropathy     "bad" (01/31/2012)  . COPD (chronic obstructive pulmonary disease)   . Emphysema   . BRBPR (bright red blood per rectum)     "don't know what it's from; had some this week" (01/31/2012)  . Chronic lower back pain   . Pernicious anemia   . GERD (gastroesophageal reflux disease)   . Polyneuropathy in other diseases classified elsewhere 11/06/2012  . Prostate cancer 2004    "had 40 treatments of radiation" (01/31/2012)  . Breast cancer in male     "left" (01/31/2012)  . Basal cell carcinoma of nasal tip   . Pneumonia 1960's    "double"   . Cervical spondylosis   . Rheumatoid arthritis(714.0)   . Arthritis     "all over my body" (04/25/2013)  . DDD (degenerative disc disease), lumbosacral     "S1; L5" (01/31/2012)  . Lumbosacral spondylosis without myelopathy 11/06/2012  . Cellulitis of right leg   . Depression     "won't take RX though" (04/25/2013)  . Falls frequently     History  Substance Use Topics  . Smoking status: Former Smoker -- 2.00 packs/day for 63 years    Types: Cigarettes    Quit date:  04/30/2013  . Smokeless tobacco: Never Used  . Alcohol Use: Yes     Comment: 04/25/2013 "used to be a heavy drinker; stopped drinking in 1975"    Family History  Problem Relation Age of Onset  . Hypertension Mother   . Cancer Mother     colon cancer  . Stroke Mother   . Aneurysm Father     abdominal aortic  . Heart disease Brother     Allergies  Allergen Reactions  . Lipitor [Atorvastatin Calcium] Other (See Comments)    Myalgias "don't remember how bad" (01/31/2012)  . Penicillins Other (See Comments)    Passed out    Current outpatient prescriptions:albuterol (PROVENTIL) (2.5 MG/3ML) 0.083% nebulizer solution, Take 2.5 mg by nebulization every 4 (four) hours as needed for wheezing or shortness of breath., Disp: , Rfl: ;  ALPRAZolam (XANAX) 1 MG tablet, Take one tablet by mouth three times daily as needed for anxiety or sleep, Disp: 90 tablet, Rfl: 5 Fluticasone-Salmeterol (ADVAIR) 250-50 MCG/DOSE AEPB, Inhale 1 puff into the lungs every 12 (twelve) hours., Disp: 2 each, Rfl: 0;  oxycodone (ROXICODONE) 30 MG immediate release tablet, Take 1 tablet (30 mg total) by mouth every 4 (four) hours as needed for pain., Disp: 150 tablet, Rfl: 0;  prednisoLONE 5 MG TABS tablet, Take 5 mg  by mouth daily., Disp: , Rfl: ;  rosuvastatin (CRESTOR) 20 MG tablet, Take 20 mg by mouth at bedtime. , Disp: , Rfl:  tiotropium (SPIRIVA) 18 MCG inhalation capsule, Place 1 capsule (18 mcg total) into inhaler and inhale daily., Disp: 20 capsule, Rfl: 0;  traZODone (DESYREL) 50 MG tablet, Take 50 mg by mouth at bedtime as needed. , Disp: , Rfl: ;  meclizine (ANTIVERT) 25 MG tablet, Take 25 mg by mouth daily as needed for dizziness., Disp: , Rfl: ;  mirtazapine (REMERON) 7.5 MG tablet, Take 7.5 mg by mouth at bedtime., Disp: , Rfl:   BP 173/71  Pulse 83  Resp 18  Ht 5\' 9"  (1.753 m)  Wt 117 lb 6.4 oz (53.252 kg)  BMI 17.33 kg/m2  Body mass index is 17.33 kg/(m^2).       On physical exam he actually  has a very clean superficial ulcer on the posterior calf which is approximately 1 x 2 cm. No evidence of surrounding erythema or infection. He has a palpable femoral pulses and palpable fem-fem bypass and also has 2+ right dorsalis pedis pulse.  He underwent noninvasive studies today in our office and this reveals a coronary index of 0.87 on the right and 0.8 nodule in the left. He has triphasic waveforms on the right and monophasic flow  Impression and plan: No evidence of ischemia to the right leg. He was reassured with this. I did explain no evidence of arterial disease to explain his severe chronic pain. We'll see [place] in one year with repeat noninvasive studies

## 2013-11-04 ENCOUNTER — Telehealth: Payer: Self-pay | Admitting: Neurology

## 2013-11-04 NOTE — Telephone Encounter (Signed)
[name] from Dr. Andrew Au office calling to state that patient contacted them and said he does not like the pain management clinic that we referred him to, states that parking is horrible and he has to wait 4 hours to get in, please return call and advise.

## 2013-11-04 NOTE — Telephone Encounter (Signed)
I called patient. The does not wish to go back to the pain center. He wants to go to the [facility] with Dr. Vira Blanco. The patient was placed on Dilaudid, and he indicates that this did not work as well as the oxycodone. He believes that his primary care Dr. has set up the pain Center referral, if this is not the case, he is to contact me back.

## 2013-11-04 NOTE — Telephone Encounter (Signed)
See phone note

## 2013-11-05 ENCOUNTER — Telehealth: Payer: Self-pay | Admitting: Neurology

## 2013-11-05 NOTE — Telephone Encounter (Signed)
Patient requesting a Rx for Diazepam 5 mg. He hasn't taken in awhile, but feels he needs them.  Please call and advise.

## 2013-11-06 MED ORDER — OXYCODONE HCL 30 MG PO TABS: 30.0000 mg | ORAL_TABLET | ORAL | Status: DC | PRN

## 2013-11-06 NOTE — Telephone Encounter (Signed)
Please advise previous note. Thanks  °

## 2013-11-06 NOTE — Telephone Encounter (Signed)
I called the patient. The patient has been referred to another pain Center, but he is running out of his dilaudid. He believes that the oxycodone worked better. I will write another prescription for the oxycodone until he can get into the other pain Center.

## 2013-11-07 NOTE — Telephone Encounter (Signed)
Called pt to inform him that his Rx was ready to be picked up at the front desk and if he has any other problems, questions or concerns to call the office. Pt verbalized understanding. °

## 2013-11-18 ENCOUNTER — Ambulatory Visit: Payer: Medicare Other | Admitting: Neurology

## 2013-11-19 ENCOUNTER — Telehealth: Payer: Self-pay | Admitting: Neurology

## 2013-11-19 DIAGNOSIS — G894 Chronic pain syndrome: Secondary | ICD-10-CM

## 2013-11-19 NOTE — Telephone Encounter (Signed)
Beverlee Nims with Dr. Andrew Au office @ [phone], requesting we send referral for Pain Management with Dr. Vira Blanco for patient.  If we prefer she send referral, please call and advise.

## 2013-11-20 NOTE — Telephone Encounter (Signed)
I will make the referral for pain management.

## 2013-11-20 NOTE — Telephone Encounter (Signed)
Please see note.

## 2013-11-27 ENCOUNTER — Ambulatory Visit (INDEPENDENT_AMBULATORY_CARE_PROVIDER_SITE_OTHER): Payer: Medicare Other | Admitting: Neurology

## 2013-11-27 ENCOUNTER — Encounter: Payer: Self-pay | Admitting: Neurology

## 2013-11-27 VITALS — BP 120/69 | HR 96 | Ht 67.0 in | Wt 108.4 lb

## 2013-11-27 DIAGNOSIS — G63 Polyneuropathy in diseases classified elsewhere: Secondary | ICD-10-CM

## 2013-11-27 DIAGNOSIS — E43 Unspecified severe protein-calorie malnutrition: Secondary | ICD-10-CM

## 2013-11-27 DIAGNOSIS — M47817 Spondylosis without myelopathy or radiculopathy, lumbosacral region: Secondary | ICD-10-CM

## 2013-11-27 DIAGNOSIS — E41 Nutritional marasmus: Secondary | ICD-10-CM

## 2013-11-27 HISTORY — DX: Unspecified severe protein-calorie malnutrition: E43

## 2013-11-27 MED ORDER — MEGESTROL ACETATE 400 MG/10ML PO SUSP: 400.0000 mg | Freq: Two times a day (BID) | ORAL | Status: DC

## 2013-11-27 MED ORDER — OXYCODONE HCL 30 MG PO TABS: 30.0000 mg | ORAL_TABLET | ORAL | Status: DC | PRN

## 2013-11-27 NOTE — Patient Instructions (Signed)
Use miralax 17 grams a day. Peripheral Neuropathy Peripheral neuropathy is a type of nerve damage. It affects nerves that carry signals between the spinal cord and other parts of the body. These are called peripheral nerves. With peripheral neuropathy, one nerve or a group of nerves may be damaged.  CAUSES  Many things can damage peripheral nerves. For some people with peripheral neuropathy, the cause is unknown. Some causes include:  Diabetes. This is the most common cause of peripheral neuropathy.  Injury to a nerve.  Pressure or stress on a nerve that lasts a long time.  Too little vitamin B. Alcoholism can lead to this.  Infections.  Autoimmune diseases, such as multiple sclerosis and systemic lupus erythematosus.  Inherited nerve diseases.  Some medicines, such as cancer drugs.  Toxic substances, such as lead and mercury.  Too little blood flowing to the legs.  Kidney disease.  Thyroid disease. SIGNS AND SYMPTOMS  Different people have different symptoms. The symptoms you have will depend on which of your nerves is damaged. Common symptoms include:  Loss of feeling (numbness) in the feet and hands.  Tingling in the feet and hands.  Pain that burns.  Very sensitive skin.  Weakness.  Not being able to move a part of the body (paralysis).  Muscle twitching.  Clumsiness or poor coordination.  Loss of balance.  Not being able to control your bladder.  Feeling dizzy.  Sexual problems. DIAGNOSIS  Peripheral neuropathy is a symptom, not a disease. Finding the cause of peripheral neuropathy can be hard. To figure that out, your health care provider will take a medical history and do a physical exam. A neurological exam will also be done. This involves checking things affected by your brain, spinal cord, and nerves (nervous system). For example, your health care provider will check your reflexes, how you move, and what you can feel.  Other types of tests may  also be ordered, such as:  Blood tests.  A test of the fluid in your spinal cord.  Imaging tests, such as CT scans or an MRI.  Electromyography (EMG). This test checks the nerves that control muscles.  Nerve conduction velocity tests. These tests check how fast messages pass through your nerves.  Nerve biopsy. A small piece of nerve is removed. It is then checked under a microscope. TREATMENT   Medicine is often used to treat peripheral neuropathy. Medicines may include:  Pain-relieving medicines. Prescription or over-the-counter medicine may be suggested.  Antiseizure medicine. This may be used for pain.  Antidepressants. These also may help ease pain from neuropathy.  Lidocaine. This is a numbing medicine. You might wear a patch or be given a shot.  Mexiletine. This medicine is typically used to help control irregular heart rhythms.  Surgery. Surgery may be needed to relieve pressure on a nerve or to destroy a nerve that is causing pain.  Physical therapy to help movement.  Assistive devices to help movement. HOME CARE INSTRUCTIONS   Only take over-the-counter or prescription medicines as directed by your health care provider. Follow the instructions carefully for any given medicines. Do not take any other medicines without first getting approval from your health care provider.  If you have diabetes, work closely with your health care provider to keep your blood sugar under control.  If you have numbness in your feet:  Check every day for signs of injury or infection. Watch for redness, warmth, and swelling.  Wear padded socks and comfortable shoes. These help  protect your feet.  Do not do things that put pressure on your damaged nerve.  Do not smoke. Smoking keeps blood from getting to damaged nerves.  Avoid or limit alcohol. Too much alcohol can cause a lack of B vitamins. These vitamins are needed for healthy nerves.  Develop a good support system. Coping with  peripheral neuropathy can be stressful. Talk to a mental health specialist or join a support group if you are struggling.  Follow up with your health care provider as directed. SEEK MEDICAL CARE IF:   You have new signs or symptoms of peripheral neuropathy.  You are struggling emotionally from dealing with peripheral neuropathy.  You have a fever. SEEK IMMEDIATE MEDICAL CARE IF:   You have an injury or infection that is not healing.  You feel very dizzy or begin vomiting.  You have chest pain.  You have trouble breathing. Document Released: 01/21/2002 Document Revised: 10/13/2010 Document Reviewed: 10/08/2012 [facility] Patient Information 2015 [place], [place]. This information is not intended to replace advice given to you by your health care provider. Make sure you discuss any questions you have with your health care provider.

## 2013-11-27 NOTE — Progress Notes (Signed)
Reason for visit: Chronic pain syndrome  [name] is an 79 y.o. male  History of present illness:  [name] is a 79 year old right-handed white male with a history of chronic pain in the neck, low back, and lower extremities. The patient has a peripheral neuropathy, but he also has peripheral vascular disease with ischemia to the right lower extremity. The patient indicates that his pain level continues to increase over time, and he is having problems sleeping and resting at night. The patient indicates that he is followed through a pain center, but they have wanted to do injections in the back, and he does not wish to pursue this. The patient has been placed on Dilaudid previously, but this resulted in nausea. The patient has a very poor appetite, and he suffered from malnutrition, he has lost 14 pounds since he was seen in July 2015. The patient is reporting some constipation on the medications. He returns for an evaluation. He walks with a walker, he denies any recent falls.  Past Medical History  Diagnosis Date  . Hyperlipidemia   . History of colon polyps   . Spinal stenosis   . Anxiety   . BPH (benign prostatic hypertrophy)   . ED (erectile dysfunction)   . Osteoporosis   . Hypertension   . Chest pain     "I've had it" (01/31/2012)   . Peripheral vascular disease     "right leg is 100% blocked; left leg has stent, still ~ 75% blocked" (01/31/2012)  . Peripheral neuropathy     "bad" (01/31/2012)  . COPD (chronic obstructive pulmonary disease)   . Emphysema   . BRBPR (bright red blood per rectum)     "don't know what it's from; had some this week" (01/31/2012)  . Chronic lower back pain   . Pernicious anemia   . GERD (gastroesophageal reflux disease)   . Polyneuropathy in other diseases classified elsewhere 11/06/2012  . Prostate cancer 2004    "had 40 treatments of radiation" (01/31/2012)  . Breast cancer in male     "left" (01/31/2012)  . Basal cell carcinoma of  nasal tip   . Pneumonia 1960's    "double"   . Cervical spondylosis   . Rheumatoid arthritis(714.0)   . Arthritis     "all over my body" (04/25/2013)  . DDD (degenerative disc disease), lumbosacral     "S1; L5" (01/31/2012)  . Lumbosacral spondylosis without myelopathy 11/06/2012  . Cellulitis of right leg   . Depression     "won't take RX though" (04/25/2013)  . Falls frequently   . Severe malnutrition 11/27/2013    Past Surgical History  Procedure Laterality Date  . Basal cell carcinoma excision      Nose x 3  . Cholecystectomy  ?1990's  . Wrist fusion Right     "3 OR's; it's fused" (01/31/2012)  . Excisional hemorrhoidectomy  1960's  . Knee surgery Right      6 surgeries; went in for simple cartilage OR; ended up w/fused knee" (01/31/2012)  . Iliac artery stent  12-07-09    Stent done by Dr. Gwenlyn Found  . Total hip arthroplasty  08/16/2011    Procedure: TOTAL HIP ARTHROPLASTY ANTERIOR APPROACH;  Surgeon: Mcarthur Rossetti, MD;  Location: [facility];  Service: Orthopedics;  Laterality: Right;  Right total hip replacement  . Cervical disc surgery      "7 total; ended w/a fusion" (01/31/2012)  . Esophagogastroduodenoscopy  02/22/2012    Procedure: ESOPHAGOGASTRODUODENOSCOPY (  EGD);  Surgeon: Arta Silence, MD;  Location: [facility];  Service: Endoscopy;  Laterality: N/A;  . Eus  02/22/2012    Procedure: UPPER ENDOSCOPIC ULTRASOUND (EUS) RADIAL;  Surgeon: Arta Silence, MD;  Location: [facility];  Service: Endoscopy;  Laterality: N/A;  . Hernia repair    . Transurethral resection of prostate    . Aortogram  04/25/2013  . Esophagogastroduodenoscopy Left 04/27/2013    Procedure: ESOPHAGOGASTRODUODENOSCOPY (EGD);  Surgeon: Arta Silence, MD;  Location: [facility] ENDOSCOPY;  Service: Endoscopy;  Laterality: Left;  . Femoral-femoral bypass graft Bilateral 05/01/2013    Procedure: BYPASS GRAFT FEMORAL-FEMORAL ARTERY/ LEFT - RIGHT;  Surgeon: Rosetta Posner, MD;  Location: [facility] OR;  Service: Vascular;   Laterality: Bilateral;    Family History  Problem Relation Age of Onset  . Hypertension Mother   . Cancer Mother     colon cancer  . Stroke Mother   . Aneurysm Father     abdominal aortic  . Heart disease Brother     Social history:  reports that he quit smoking about 6 months ago. His smoking use included Cigarettes. He has a 126 pack-year smoking history. He has never used smokeless tobacco. He reports that he drinks alcohol. He reports that he does not use illicit drugs.    Allergies  Allergen Reactions  . Lipitor [Atorvastatin Calcium] Other (See Comments)    Myalgias "don't remember how bad" (01/31/2012)  . Penicillins Other (See Comments)    Passed out    Medications:  Current Outpatient Prescriptions on File Prior to Visit  Medication Sig Dispense Refill  . albuterol (PROVENTIL) (2.5 MG/3ML) 0.083% nebulizer solution Take 2.5 mg by nebulization every 4 (four) hours as needed for wheezing or shortness of breath.      . ALPRAZolam (XANAX) 1 MG tablet Take one tablet by mouth three times daily as needed for anxiety or sleep  90 tablet  5  . Fluticasone-Salmeterol (ADVAIR) 250-50 MCG/DOSE AEPB Inhale 1 puff into the lungs every 12 (twelve) hours.  2 each  0  . rosuvastatin (CRESTOR) 20 MG tablet Take 20 mg by mouth at bedtime.       Marland Kitchen tiotropium (SPIRIVA) 18 MCG inhalation capsule Place 1 capsule (18 mcg total) into inhaler and inhale daily.  20 capsule  0  . traZODone (DESYREL) 50 MG tablet Take 50 mg by mouth at bedtime as needed.        No current facility-administered medications on file prior to visit.    ROS:  Out of a complete 14 system review of symptoms, the patient complains only of the following symptoms, and all other reviewed systems are negative.  Decreased activity level, chills, fatigue Blurred vision Shortness of breath Leg swelling Cold intolerance Constipation, rectal pain Restless legs, frequent waking, daytime sleepiness Frequent  infections Painful urination, incontinence of the bladder Joint pain, joint swelling, back pain, achy muscles, walking difficulties, neck pain, neck stiffness Dizziness  Blood pressure 120/69, pulse 96, height 5\' 7"  (1.702 m), weight 108 lb 6.4 oz (49.17 kg).  Physical Exam  General: The patient is alert and cooperative at the time of the examination.  Skin: No significant peripheral edema is noted.The right leg is fused at the knee.   Neurologic Exam  Mental status: The patient is oriented x 3.  Cranial nerves: Facial symmetry is present. Speech is normal, no aphasia or dysarthria is noted. Extraocular movements are full. Visual fields are full.  Motor: The patient has good strength in all 4  extremities.  Sensory examination:Soft touch sensation is symmetric on the face, arms, and legs.  Coordination: The patient has good finger-nose-finger bilaterally.  Gait and station: The patient walks with a walker. The right knee is fused. Tandem gait was not attempted.  Reflexes: Deep tendon reflexes are symmetric, but are depressed.    MRI brain 10/08/13:  IMPRESSION:  Atrophy and chronic microvascular ischemia, moderate in degree. No  acute infarct.  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 Peripheral neuropathy  2. Gait disorder  3. Chronic pain syndrome  4. Peripheral vascular disease  5. Malnutrition, weight loss  The patient is having worsening pain, and he is now suffering from significant malnutrition with weight loss. The patient will be placed on Megace, and the pain medications will be increased. The patient is to be referred to another pain center. The patient will followup in 4 months. The patient is to use MiraLax on a daily basis for his constipation issues.  Jill Alexanders MD 11/27/2013 7:00 PM  [facility] [name] [place], [place] [address]  Phone [phone]  Fax [phone]

## 2013-11-28 ENCOUNTER — Telehealth: Payer: Self-pay | Admitting: Neurology

## 2013-11-28 NOTE — Telephone Encounter (Signed)
Inez Catalina with [facility] @ [phone], received referral and need records from [place]   Please call and advise.

## 2013-11-28 NOTE — Telephone Encounter (Signed)
Explained to patient he needs to call [facility] to get his records sent to [facility]. He says he is getting ready to go into Rehab and will follow up about records.

## 2013-12-02 ENCOUNTER — Other Ambulatory Visit: Payer: Self-pay | Admitting: *Deleted

## 2013-12-02 MED ORDER — OXYCODONE HCL 15 MG PO TABS: ORAL_TABLET | ORAL | Status: DC

## 2013-12-02 MED ORDER — ALPRAZOLAM 1 MG PO TABS: ORAL_TABLET | ORAL | Status: DC

## 2013-12-02 MED ORDER — HYDROMORPHONE HCL 4 MG PO TABS: ORAL_TABLET | ORAL | Status: DC

## 2013-12-02 MED ORDER — DIAZEPAM 5 MG PO TABS: ORAL_TABLET | ORAL | Status: DC

## 2013-12-02 NOTE — Telephone Encounter (Signed)
[employer] order Form. [name] called and wanted printed.

## 2013-12-03 ENCOUNTER — Telehealth: Payer: Self-pay

## 2013-12-03 MED ORDER — DRONABINOL 5 MG PO CAPS: 5.0000 mg | ORAL_CAPSULE | Freq: Two times a day (BID) | ORAL | Status: DC

## 2013-12-03 NOTE — Telephone Encounter (Signed)
I'll try Marinol for this patient. Unfortunately, this may not be covered either.

## 2013-12-03 NOTE — Telephone Encounter (Signed)
[facility] Medicare sent [place] a letter saying they will not approve coverage for Megace because this drug is excluded from the plan.  Unfortunately, no formulary alternatives are available.  Please advise.  Thank you.

## 2013-12-04 ENCOUNTER — Non-Acute Institutional Stay (SKILLED_NURSING_FACILITY): Payer: Medicare Other | Admitting: Internal Medicine

## 2013-12-04 ENCOUNTER — Encounter: Payer: Self-pay | Admitting: Internal Medicine

## 2013-12-04 DIAGNOSIS — M79661 Pain in right lower leg: Secondary | ICD-10-CM

## 2013-12-04 DIAGNOSIS — M549 Dorsalgia, unspecified: Secondary | ICD-10-CM

## 2013-12-04 DIAGNOSIS — G63 Polyneuropathy in diseases classified elsewhere: Secondary | ICD-10-CM

## 2013-12-04 DIAGNOSIS — G8929 Other chronic pain: Secondary | ICD-10-CM

## 2013-12-04 DIAGNOSIS — F419 Anxiety disorder, unspecified: Secondary | ICD-10-CM

## 2013-12-04 DIAGNOSIS — E41 Nutritional marasmus: Secondary | ICD-10-CM

## 2013-12-04 DIAGNOSIS — I739 Peripheral vascular disease, unspecified: Secondary | ICD-10-CM

## 2013-12-04 DIAGNOSIS — E785 Hyperlipidemia, unspecified: Secondary | ICD-10-CM

## 2013-12-04 DIAGNOSIS — J449 Chronic obstructive pulmonary disease, unspecified: Secondary | ICD-10-CM

## 2013-12-04 DIAGNOSIS — D519 Vitamin B12 deficiency anemia, unspecified: Secondary | ICD-10-CM

## 2013-12-04 DIAGNOSIS — E43 Unspecified severe protein-calorie malnutrition: Secondary | ICD-10-CM

## 2013-12-04 NOTE — Assessment & Plan Note (Signed)
Pt on statin, being folowed by vascular

## 2013-12-04 NOTE — Assessment & Plan Note (Signed)
Continue crestor 20 mg

## 2013-12-04 NOTE — Assessment & Plan Note (Signed)
Valium and zoloft to help sleep with chronic pain

## 2013-12-04 NOTE — Assessment & Plan Note (Signed)
Continue xanax 

## 2013-12-04 NOTE — Assessment & Plan Note (Signed)
On dilaudid and being refer to a pain clinic

## 2013-12-04 NOTE — Assessment & Plan Note (Signed)
Chronic,stable ;on MDI's

## 2013-12-04 NOTE — Assessment & Plan Note (Signed)
Weight loss since July;pt is admitted to SNF for OT/PT to help strengthen him

## 2013-12-04 NOTE — Assessment & Plan Note (Signed)
Getting B12 injections at PCP

## 2013-12-04 NOTE — Progress Notes (Signed)
MRN: [id number] Name: [name]  Sex: male Age: 79 y.o. DOB: Apr 08, 1934  [place] #: [name] Facility/Room:102 Level Of Care: SNF Provider: Inocencio Homes D Emergency Contacts: Extended Emergency Contact Information Primary Emergency Contact: [facility] Address: [address]          [place],  [address] [name] of [place] Phone: [phone] Relation: Daughter Secondary Emergency Contact: [name] [place] of [place] Mobile Phone: [phone] Relation: Son  Code Status: FULL  Allergies: Lipitor and Penicillins  Chief Complaint  Patient presents with  . New Admit To SNF    HPI: Patient is 79 y.o. male who has malnutrition amd LE weakness admitted for OT/PT to improve strength.  Past Medical History  Diagnosis Date  . History of colon polyps   . Spinal stenosis   . Anxiety   . BPH (benign prostatic hypertrophy)   . ED (erectile dysfunction)   . Osteoporosis   . Hypertension   . Chest pain     "I've had it" (01/31/2012)   . Peripheral vascular disease     "right leg is 100% blocked; left leg has stent, still ~ 75% blocked" (01/31/2012)  . Peripheral neuropathy     "bad" (01/31/2012)  . COPD (chronic obstructive pulmonary disease)   . Emphysema   . BRBPR (bright red blood per rectum)     "don't know what it's from; had some this week" (01/31/2012)  . Chronic lower back pain   . Pernicious anemia   . GERD (gastroesophageal reflux disease)   . Polyneuropathy in other diseases classified elsewhere 11/06/2012  . Prostate cancer 2004    "had 40 treatments of radiation" (01/31/2012)  . Breast cancer in male     "left" (01/31/2012)  . Basal cell carcinoma of nasal tip   . Pneumonia 1960's    "double"   . Cervical spondylosis   . Rheumatoid arthritis(714.0)   . Arthritis     "all over my body" (04/25/2013)  . DDD (degenerative disc disease), lumbosacral     "S1; L5" (01/31/2012)  . Lumbosacral spondylosis without myelopathy 11/06/2012   . Cellulitis of right leg   . Depression     "won't take RX though" (04/25/2013)  . Falls frequently   . Severe malnutrition 11/27/2013  . Hyperlipidemia     Past Surgical History  Procedure Laterality Date  . Basal cell carcinoma excision      Nose x 3  . Cholecystectomy  ?1990's  . Wrist fusion Right     "3 OR's; it's fused" (01/31/2012)  . Excisional hemorrhoidectomy  1960's  . Knee surgery Right      6 surgeries; went in for simple cartilage OR; ended up w/fused knee" (01/31/2012)  . Iliac artery stent  12-07-09    Stent done by Dr. Gwenlyn Found  . Total hip arthroplasty  08/16/2011    Procedure: TOTAL HIP ARTHROPLASTY ANTERIOR APPROACH;  Surgeon: Mcarthur Rossetti, MD;  Location: [facility];  Service: Orthopedics;  Laterality: Right;  Right total hip replacement  . Cervical disc surgery      "7 total; ended w/a fusion" (01/31/2012)  . Esophagogastroduodenoscopy  02/22/2012    Procedure: ESOPHAGOGASTRODUODENOSCOPY (EGD);  Surgeon: Arta Silence, MD;  Location: [facility];  Service: Endoscopy;  Laterality: N/A;  . Eus  02/22/2012    Procedure: UPPER ENDOSCOPIC ULTRASOUND (EUS) RADIAL;  Surgeon: Arta Silence, MD;  Location: [facility];  Service: Endoscopy;  Laterality: N/A;  . Hernia repair    . Transurethral resection of prostate    .  Aortogram  04/25/2013  . Esophagogastroduodenoscopy Left 04/27/2013    Procedure: ESOPHAGOGASTRODUODENOSCOPY (EGD);  Surgeon: Arta Silence, MD;  Location: [facility] ENDOSCOPY;  Service: Endoscopy;  Laterality: Left;  . Femoral-femoral bypass graft Bilateral 05/01/2013    Procedure: BYPASS GRAFT FEMORAL-FEMORAL ARTERY/ LEFT - RIGHT;  Surgeon: Rosetta Posner, MD;  Location: [facility];  Service: Vascular;  Laterality: Bilateral;      Medication List       This list is accurate as of: 12/04/13  8:16 PM.  Always use your most recent med list.               albuterol (2.5 MG/3ML) 0.083% nebulizer solution  Commonly known as:  PROVENTIL  Take 2.5 mg by  nebulization every 4 (four) hours as needed for wheezing or shortness of breath.     ALPRAZolam 1 MG tablet  Commonly known as:  XANAX  Take 1 and 1/2 tablet by mouth up to twice daily as needed     diazepam 5 MG tablet  Commonly known as:  VALIUM  Take one tablet by mouth twice daily and take two tablets by mouth at bedtime for musle spasms     Fluticasone-Salmeterol 250-50 MCG/DOSE Aepb  Commonly known as:  ADVAIR  Inhale 1 puff into the lungs every 12 (twelve) hours.     HYDROmorphone 4 MG tablet  Commonly known as:  DILAUDID  Take one tablet by mouth every 6 hours as needed for pain     predniSONE 10 MG tablet  Commonly known as:  DELTASONE  Take 10 mg by mouth daily with breakfast.     rosuvastatin 20 MG tablet  Commonly known as:  CRESTOR  Take 20 mg by mouth at bedtime.     sertraline 50 MG tablet  Commonly known as:  ZOLOFT  Take 50 mg by mouth at bedtime.     sildenafil 100 MG tablet  Commonly known as:  VIAGRA  Take 100 mg by mouth as needed for erectile dysfunction.     tiotropium 18 MCG inhalation capsule  Commonly known as:  SPIRIVA  Place 1 capsule (18 mcg total) into inhaler and inhale daily.        Meds ordered this encounter  Medications  . sertraline (ZOLOFT) 50 MG tablet    Sig: Take 50 mg by mouth at bedtime.  . predniSONE (DELTASONE) 10 MG tablet    Sig: Take 10 mg by mouth daily with breakfast.  . sildenafil (VIAGRA) 100 MG tablet    Sig: Take 100 mg by mouth as needed for erectile dysfunction.    Immunization History  Administered Date(s) Administered  . Influenza Split 11/15/2011, 11/14/2012  . Influenza-Unspecified 10/15/2012  . Pneumococcal Polysaccharide-23 11/14/2008    History  Substance Use Topics  . Smoking status: Former Smoker -- 2.00 packs/day for 63 years    Types: Cigarettes    Quit date: 04/30/2013  . Smokeless tobacco: Never Used  . Alcohol Use: Yes     Comment: 04/25/2013 "used to be a heavy drinker; stopped  drinking in 1975"    Family history is noncontributory    Review of Systems  DATA OBTAINED: from patient GENERAL:  no fevers, fatigue, appetite changes SKIN: No itching, rash or wounds EYES: No eye pain, redness, discharge EARS: No earache, tinnitus, change in hearing NOSE: No congestion, drainage or bleeding  MOUTH/THROAT: No mouth or tooth pain, No sore throat RESPIRATORY: No cough, wheezing, SOB CARDIAC: No chest pain, palpitations, lower extremity edema  GI:  No abdominal pain, No N/V/D or constipation, No heartburn or reflux  GU: No dysuria, frequency or urgency, or incontinence  MUSCULOSKELETAL: c/o all his pain areas in detail NEUROLOGIC: No headache, dizziness or focal weakness PSYCHIATRIC: No overt anxiety or sadness, No behavior issue.   Filed Vitals:   12/04/13 1502  BP: 111/59  Pulse: 64  Temp: 98 F (36.7 C)  Resp: 20    Physical Exam  GENERAL APPEARANCE: Alert, conversant,  No acute distress;sitting in WC  SKIN: No diaphoresis rash HEAD: Normocephalic, atraumatic  EYES: Conjunctiva/lids clear. Pupils round, reactive. EOMs intact.  EARS: External exam WNL, canals clear. Hearing grossly normal.  NOSE: No deformity or discharge.  MOUTH/THROAT: Lips w/o lesions  RESPIRATORY: Breathing is even, unlabored. Lung sounds are diffusely decreased  CARDIOVASCULAR: Heart RRR no murmurs, rubs or gallops. No peripheral edema.   GASTROINTESTINAL: Abdomen is soft, non-tender, not distended w/ normal bowel sounds. GENITOURINARY: Bladder non tender, not distended  MUSCULOSKELETAL: No abnormal joints or musculature NEUROLOGIC:  Cranial nerves 2-12 grossly intact. Moves all extremities  PSYCHIATRIC: Mood and affect appropriate to situation, no behavioral issues  Patient Active Problem List   Diagnosis Date Noted  . Chronic back pain 12/04/2013  . Hyperlipidemia   . Severe malnutrition 11/27/2013  . Atherosclerotic PVD with ulceration 10/08/2013  . Pain of right lower leg  08/20/2013  . Pain 06/24/2013  . Insomnia 06/12/2013  . Duodenal ulcer 05/18/2013  . Perirectal abscess 05/18/2013  . Rheumatoid arthritis 05/18/2013  . Cervical spondylosis   . Anemia B twelve deficiency 04/27/2013  . GI bleed 04/26/2013  . Protein-calorie malnutrition, severe 04/26/2013  . Dysphagia, unspecified(787.20) 04/24/2013  . Diarrhea 04/24/2013  . Cough 04/24/2013  . Cellulitis 04/20/2013  . Sepsis 04/20/2013  . Pneumonia, organism unspecified 01/25/2013  . Polyneuropathy in other diseases classified elsewhere 11/06/2012  . Lumbosacral spondylosis without myelopathy 11/06/2012  . Pain localized to upper abdomen 02/03/2012  . Depression 01/31/2012  . Failure to thrive 01/31/2012  . Cachexia 01/31/2012  . COPD exacerbation 01/31/2012  . Pancreatic mass 01/31/2012  . Weight loss 01/31/2012  . Syncope 08/14/2011  . Fall 08/14/2011  . Femur fracture 08/14/2011  . Thrombocytopenia 08/14/2011  . COPD (chronic obstructive pulmonary disease) 08/14/2011  . PVD (peripheral vascular disease) 08/14/2011  . Rheumatoid arthritis 08/14/2011  . Anxiety 08/14/2011  . Chronic total occlusion of artery of the extremities 05/10/2011    CBC    Component Value Date/Time   WBC 8.0 05/10/2013 1121   RBC 3.41* 05/10/2013 1121   RBC 2.92* 08/17/2011 0645   HGB 11.0* 05/10/2013 1121   HCT 32.7* 05/10/2013 1121   PLT PLATELET CLUMPS NOTED ON SMEAR, COUNT APPEARS ADEQUATE 05/10/2013 1121   MCV 95.9 05/10/2013 1121   LYMPHSABS 0.7 01/31/2012 2006   MONOABS 0.3 01/31/2012 2006   EOSABS 0.0 01/31/2012 2006   BASOSABS 0.0 01/31/2012 2006    CMP     Component Value Date/Time   NA 136* 05/10/2013 1121   K 4.9 05/10/2013 1121   CL 97 05/10/2013 1121   CO2 29 05/10/2013 1121   GLUCOSE 112* 05/10/2013 1121   BUN 12 05/10/2013 1121   CREATININE 0.91 05/10/2013 1121   CALCIUM 8.9 05/10/2013 1121   PROT 5.9* 04/21/2013 0615   ALBUMIN 2.5* 04/21/2013 0615   AST 44* 04/21/2013 0615   ALT 10 04/21/2013 0615    ALKPHOS 79 04/21/2013 0615   BILITOT 1.2 04/21/2013 0615   GFRNONAA 80* 05/10/2013 1121   GFRAA >90  05/10/2013 1121    Assessment and Plan  Chronic back pain On dilaudid and being refer to a pain clinic  Polyneuropathy in other diseases classified elsewhere Valium and zoloft to help sleep with chronic pain  Severe malnutrition Weight loss since July;pt is admitted to SNF for OT/PT to help strengthen him  Pain of right lower leg Chronic;2/2 PVD  PVD (peripheral vascular disease) Pt on statin, being folowed by vascular  COPD (chronic obstructive pulmonary disease) Chronic,stable ;on MDI's  Anemia B twelve deficiency Getting B12 injections at PCP  Anxiety Continue xanax  Hyperlipidemia Continue crestor 20 mg    Hennie Duos, MD

## 2013-12-04 NOTE — Assessment & Plan Note (Signed)
Chronic;2/2 PVD

## 2013-12-05 ENCOUNTER — Telehealth: Payer: Self-pay | Admitting: Neurology

## 2013-12-05 NOTE — Telephone Encounter (Signed)
[name] from [facility] calling to check whether Dr. Jannifer Franklin is aware that patient has received hydromorphone recently, please return call and advise.

## 2013-12-05 NOTE — Telephone Encounter (Signed)
I called back.  Spoke with Estill Bamberg.  Said they were calling about Marinol.  As suspected, ins will not cover this medication either.

## 2013-12-05 NOTE — Telephone Encounter (Signed)
I called the patient. He is in a rehab facility. They are the ones that ordered the dilaudid. I indicated that we could not get Megace or Marinol, the patient apparently is eating better now in a rehabilitation facility. He will take the oxycodone when he goes home from the rehabilitation facility.

## 2013-12-17 ENCOUNTER — Telehealth: Payer: Self-pay | Admitting: Neurology

## 2013-12-17 NOTE — Telephone Encounter (Signed)
I have called this patient 12/16/13 and 12/17/13 and left messages for him to return my call. He needs to contact Dr. Cherre Huger office and sign a release for them to [facility] [phone] fax [phone] as they will not schedule on the referral without these records.

## 2013-12-17 NOTE — Telephone Encounter (Signed)
Pt called back and we discussed what he needs to do. Her verbalized understanding but would like for Dr. Jannifer Franklin to call him to discuss some other matters.

## 2013-12-18 NOTE — Telephone Encounter (Signed)
I called the patient.the patient does not appear to want to go to the pain Center, we will provide his medications for now. He will remain in the rehabilitation center for the next week or 2.

## 2013-12-18 NOTE — Telephone Encounter (Signed)
I called the patient. I left a message, I will call back later.

## 2013-12-23 ENCOUNTER — Non-Acute Institutional Stay (SKILLED_NURSING_FACILITY): Payer: Medicare Other | Admitting: Internal Medicine

## 2013-12-23 ENCOUNTER — Encounter: Payer: Self-pay | Admitting: Internal Medicine

## 2013-12-23 DIAGNOSIS — G63 Polyneuropathy in diseases classified elsewhere: Secondary | ICD-10-CM

## 2013-12-23 DIAGNOSIS — M549 Dorsalgia, unspecified: Secondary | ICD-10-CM

## 2013-12-23 DIAGNOSIS — J42 Unspecified chronic bronchitis: Secondary | ICD-10-CM

## 2013-12-23 DIAGNOSIS — F32A Depression, unspecified: Secondary | ICD-10-CM

## 2013-12-23 DIAGNOSIS — E43 Unspecified severe protein-calorie malnutrition: Secondary | ICD-10-CM

## 2013-12-23 DIAGNOSIS — F329 Major depressive disorder, single episode, unspecified: Secondary | ICD-10-CM

## 2013-12-23 DIAGNOSIS — E41 Nutritional marasmus: Secondary | ICD-10-CM

## 2013-12-23 DIAGNOSIS — G8929 Other chronic pain: Secondary | ICD-10-CM

## 2013-12-23 DIAGNOSIS — M069 Rheumatoid arthritis, unspecified: Secondary | ICD-10-CM

## 2013-12-23 DIAGNOSIS — I739 Peripheral vascular disease, unspecified: Secondary | ICD-10-CM

## 2013-12-23 NOTE — Progress Notes (Signed)
Patient ID: [name], male   DOB: 06-Mar-1934, 79 y.o.   MRN: [id number]     .   This is a discharge note.  Level care skilled.  [place]   Chief complaint-discharge note  HPI: Patient is 79 y.o. male who has malnutrition amd LE weakness admitted for OT/PT to improve strength.--he has gained some strength -he also has a history of significant malnutrition he has gained a small amount of weight this will have to be monitored as an outpatient.  He will need extensive PT OT and home health support as well as a CNA and a Education officer, museum to help with social issues he does live alone although apparently he does have family supporthave not noted any recent acute issues he does have a significant history of pain and rheumatoid arthritis he is on  prednisone for his rheumatoid arthritic symptoms  In regards to pain issues he is on Dilaudid-also on OxyContin for breakthrough pain.  He has also followed by psychiatric services with a history of depression he continues on Xanax as well as Zoloft-Zoloft was recently increased by psychiatric services  Past Medical History  Diagnosis Date  . History of colon polyps   . Spinal stenosis   . Anxiety   . BPH (benign prostatic hypertrophy)   . ED (erectile dysfunction)   . Osteoporosis   . Hypertension   . Chest pain     "I've had it" (01/31/2012)   . Peripheral vascular disease     "right leg is 100% blocked; left leg has stent, still ~ 75% blocked" (01/31/2012)  . Peripheral neuropathy     "bad" (01/31/2012)  . COPD (chronic obstructive pulmonary disease)   . Emphysema   . BRBPR (bright red blood per rectum)     "don't know what it's from; had some this week" (01/31/2012)  . Chronic lower back pain   . Pernicious anemia   . GERD (gastroesophageal reflux disease)   . Polyneuropathy in other diseases classified elsewhere 11/06/2012  . Prostate cancer 2004     "had 40 treatments of radiation" (01/31/2012)  . Breast cancer in male     "left" (01/31/2012)  . Basal cell carcinoma of nasal tip   . Pneumonia 1960's    "double"   . Cervical spondylosis   . Rheumatoid arthritis(714.0)   . Arthritis     "all over my body" (04/25/2013)  . DDD (degenerative disc disease), lumbosacral     "S1; L5" (01/31/2012)  . Lumbosacral spondylosis without myelopathy 11/06/2012  . Cellulitis of right leg   . Depression     "won't take RX though" (04/25/2013)  . Falls frequently   . Severe malnutrition 11/27/2013  . Hyperlipidemia     Past Surgical History  Procedure Laterality Date  . Basal cell carcinoma excision      Nose x 3  . Cholecystectomy  ?1990's  . Wrist fusion Right     "3 OR's; it's fused" (01/31/2012)  . Excisional hemorrhoidectomy  1960's  . Knee surgery Right     6 surgeries; went in for simple cartilage OR; ended up w/fused knee" (01/31/2012)  . Iliac artery stent  12-07-09    Stent done by Dr. Gwenlyn Found  . Total hip arthroplasty  08/16/2011    Procedure: TOTAL HIP ARTHROPLASTY ANTERIOR APPROACH; Surgeon: Mcarthur Rossetti, MD; Location: [facility]; Service: Orthopedics; Laterality: Right; Right total hip replacement  . Cervical disc surgery      "7 total; ended w/a fusion" (  01/31/2012)  . Esophagogastroduodenoscopy  02/22/2012    Procedure: ESOPHAGOGASTRODUODENOSCOPY (EGD); Surgeon: Arta Silence, MD; Location: [facility]; Service: Endoscopy; Laterality: N/A;  . Eus  02/22/2012    Procedure: UPPER ENDOSCOPIC ULTRASOUND (EUS) RADIAL; Surgeon: Arta Silence, MD; Location: [facility]; Service: Endoscopy; Laterality: N/A;  . Hernia repair    . Transurethral resection of prostate    . Aortogram  04/25/2013  . Esophagogastroduodenoscopy Left 04/27/2013    Procedure: ESOPHAGOGASTRODUODENOSCOPY  (EGD); Surgeon: Arta Silence, MD; Location[facility]; Service: Endoscopy; Laterality: Left;  . Femoral-femoral bypass graft Bilateral 05/01/2013    Procedure: BYPASS GRAFT FEMORAL-FEMORAL ARTERY/ LEFT - RIGHT; Surgeon: Rosetta Posner, MD; Location: [facility]; Service: Vascular; Laterality: Bilateral;                                                                                    Immunization History  Administered Date(s) Administered  . Influenza Split 11/15/2011, 11/14/2012  . Influenza-Unspecified 10/15/2012  . Pneumococcal Polysaccharide-23 11/14/2008    History  Substance Use Topics  . Smoking status: Former Smoker -- 2.00 packs/day for 63 years    Types: Cigarettes    Quit date: 04/30/2013  . Smokeless tobacco: Never Used  . Alcohol Use: Yes     Comment: 04/25/2013 "used to be a heavy drinker; stopped drinking in 1975"    Family history is noncontributory   Review of Systems  DATA OBTAINED: from patient GENERAL: no fevers, fatigue, appetite changes SKIN: No itching, rash or wounds EYES: No eye pain, redness, discharge EARS: No earache, tinnitus, change in hearing NOSE: No congestion, drainage or bleeding  MOUTH/THROAT: No mouth or tooth pain, No sore throat RESPIRATORY: No cough, wheezing, SOB--has history of COPD CARDIAC: No chest pain, palpitations, lower extremity edema  GI: No abdominal pain, No N/V/D or constipation, No heartburn or reflux  GU: No dysuria, frequency or urgency, or incontinence  MUSCULOSKELETAL:continues to complain of generalized pain at times NEUROLOGIC: No headache, dizziness or focal weakness PSYCHIATRIC: No overt anxiety or sadness, No behavior issue.                       Physical Exam Temperature 96.1 pulse 75 respirations 20 blood pressure 116/74 and weight is 107 this is a slight weight gain GENERAL APPEARANCE: Alert,  conversant, No acute distress;s  SKIN: No diaphoresis rash HEAD: Normocephalic, atraumatic  EYES: Conjunctiva/lids clear. Pupils round, reactive. EOMs intact.  EARS: External exam WNL, c. Hearing grossly normal.  NOSE: No deformity or discharge.  MOUTH/THROAT: Lips w/o lesionsmucous membranes appear fairly moist oropharynx is clear  RESPIRATORY: Breathing is even, unlabored. Lung sounds are diffusely decreased  CARDIOVASCULAR: Heart RRR no murmurs--distant heart sounds, . No peripheral edema.  GASTROINTESTINAL: Abdomen is soft, non-tender, not distended w/ normal bowel sounds. GENITOURINARY: Bladder non tender, not distended  MUSCULOSKELETAL: No abnormal joints or musculature NEUROLOGIC: Cranial nerves 2-12 grossly intact. Moves all extremities is able to ambulate with a rolling walker PSYCHIATRIC: Mood and affect appropriate to situation, no behavioral issues  Patient Active Problem List   Diagnosis Date Noted  . Chronic back pain 12/04/2013  . Hyperlipidemia   . Severe malnutrition 11/27/2013  .  Atherosclerotic PVD with ulceration 10/08/2013  . Pain of right lower leg 08/20/2013  . Pain 06/24/2013  . Insomnia 06/12/2013  . Duodenal ulcer 05/18/2013  . Perirectal abscess 05/18/2013  . Rheumatoid arthritis 05/18/2013  . Cervical spondylosis   . Anemia B twelve deficiency 04/27/2013  . GI bleed 04/26/2013  . Protein-calorie malnutrition, severe 04/26/2013  . Dysphagia, unspecified(787.20) 04/24/2013  . Diarrhea 04/24/2013  . Cough 04/24/2013  . Cellulitis 04/20/2013  . Sepsis 04/20/2013  . Pneumonia, organism unspecified 01/25/2013  . Polyneuropathy in other diseases classified elsewhere 11/06/2012  . Lumbosacral spondylosis without myelopathy 11/06/2012  . Pain localized to upper abdomen 02/03/2012  . Depression 01/31/2012  . Failure to thrive 01/31/2012  . Cachexia 01/31/2012    . COPD exacerbation 01/31/2012  . Pancreatic mass 01/31/2012  . Weight loss 01/31/2012  . Syncope 08/14/2011  . Fall 08/14/2011  . Femur fracture 08/14/2011  . Thrombocytopenia 08/14/2011  . COPD (chronic obstructive pulmonary disease) 08/14/2011  . PVD (peripheral vascular disease) 08/14/2011  . Rheumatoid arthritis 08/14/2011  . Anxiety 08/14/2011  . Chronic total occlusion of artery of the extremities 05/10/2011    CBC  Labs (Brief)       Component Value Date/Time   WBC 8.0 05/10/2013 1121   RBC 3.41* 05/10/2013 1121   RBC 2.92* 08/17/2011 0645   HGB 11.0* 05/10/2013 1121   HCT 32.7* 05/10/2013 1121   PLT PLATELET CLUMPS NOTED ON SMEAR, COUNT APPEARS ADEQUATE 05/10/2013 1121   MCV 95.9 05/10/2013 1121   LYMPHSABS 0.7 01/31/2012 2006   MONOABS 0.3 01/31/2012 2006   EOSABS 0.0 01/31/2012 2006   BASOSABS 0.0 01/31/2012 2006      CMP  Labs (Brief)       Component Value Date/Time   NA 136* 05/10/2013 1121   K 4.9 05/10/2013 1121   CL 97 05/10/2013 1121   CO2 29 05/10/2013 1121   GLUCOSE 112* 05/10/2013 1121   BUN 12 05/10/2013 1121   CREATININE 0.91 05/10/2013 1121   CALCIUM 8.9 05/10/2013 1121   PROT 5.9* 04/21/2013 0615   ALBUMIN 2.5* 04/21/2013 0615   AST 44* 04/21/2013 0615   ALT 10 04/21/2013 0615   ALKPHOS 79 04/21/2013 0615   BILITOT 1.2 04/21/2013 0615   GFRNONAA 80* 05/10/2013 1121   GFRAA >90 05/10/2013 1121      Assessment and Plan  Chronic back pain On dilaudid -also on OxyContin when necessary this is followed by the pain clinic  Polyneuropathy in other diseases classified elsewhere Valium and zoloft to help sleep with chronic pain  Severe malnutrition Weight loss since July;pt is admitted to SNF for OT/PT to help strengthen him--he has gained some strength but will need continued PT and OT certainly with nursing  support again will need a CNA as well as social work support as well  Pain of right lower leg Chronic;2/2 PVD  PVD (peripheral vascular disease) Pt on statin, being folowed by vascular  COPD (chronic obstructive pulmonary disease) Chronic,stable ;on MDI's  Anemia B twelve deficiency Getting B12 injections at PCP  Anxiety Continue xanax--this has been followed by psychiatric services while in the facility  Hyperlipidemia Continue crestor 20 mg--follow-up by primary care provider.  Patient continues to still have some weakness he has gained strength and a bit of weight-he will have to be followed closely in the home setting and will have social worker support which I think is very important-again will need continued therapy nursing support as well as CNA support as well  [id number] note greater than 35 minutes spent on this discharge summary-scripts have been written

## 2013-12-25 ENCOUNTER — Telehealth: Payer: Self-pay | Admitting: Neurology

## 2013-12-25 MED ORDER — OXYCODONE HCL 30 MG PO TABS: 30.0000 mg | ORAL_TABLET | ORAL | Status: DC | PRN

## 2013-12-25 NOTE — Telephone Encounter (Signed)
I will write a prescription for the oxycodone. The patient is out of the rehabilitation facility at this time.

## 2013-12-25 NOTE — Telephone Encounter (Signed)
Patient is requesting a Rx for Oxycodone (not currently on med list).  It appears this Rx was last prescribed on 11/27/2013 Oxycodone 30mg  one tablet every 4 hours as needed for pain #180.  Please advise.  Thank you.

## 2013-12-25 NOTE — Telephone Encounter (Signed)
Patient calling to request refill of oxycodone, please call when ready for pick up.

## 2013-12-26 NOTE — Telephone Encounter (Signed)
I called the patient to let them know their Rx for Oxycodone was ready for pickup. Patient was instructed to bring Photo ID.

## 2013-12-30 ENCOUNTER — Other Ambulatory Visit: Payer: Self-pay

## 2013-12-30 MED ORDER — ALPRAZOLAM 1 MG PO TABS: ORAL_TABLET | ORAL | Status: AC

## 2013-12-30 MED ORDER — HYDROMORPHONE HCL 4 MG PO TABS: 4.0000 mg | ORAL_TABLET | Freq: Four times a day (QID) | ORAL | Status: DC | PRN

## 2013-12-30 MED ORDER — DIAZEPAM 5 MG PO TABS: ORAL_TABLET | ORAL | Status: DC

## 2013-12-30 NOTE — Telephone Encounter (Signed)
RX sent to [facility] @ [phone]. Phone number [phone]

## 2014-01-01 ENCOUNTER — Encounter: Payer: Self-pay | Admitting: Neurology

## 2014-01-01 ENCOUNTER — Non-Acute Institutional Stay (SKILLED_NURSING_FACILITY): Payer: Medicare Other | Admitting: Internal Medicine

## 2014-01-01 DIAGNOSIS — E785 Hyperlipidemia, unspecified: Secondary | ICD-10-CM

## 2014-01-01 DIAGNOSIS — M549 Dorsalgia, unspecified: Secondary | ICD-10-CM

## 2014-01-01 DIAGNOSIS — G8929 Other chronic pain: Secondary | ICD-10-CM

## 2014-01-01 DIAGNOSIS — F329 Major depressive disorder, single episode, unspecified: Secondary | ICD-10-CM

## 2014-01-01 DIAGNOSIS — I739 Peripheral vascular disease, unspecified: Secondary | ICD-10-CM

## 2014-01-01 DIAGNOSIS — J439 Emphysema, unspecified: Secondary | ICD-10-CM

## 2014-01-01 DIAGNOSIS — F32A Depression, unspecified: Secondary | ICD-10-CM

## 2014-01-01 NOTE — Progress Notes (Signed)
MRN: [id number] Name: [name]  Sex: male Age: 79 y.o. DOB: 06-Jan-1935  [place] #: [name] Facility/Room: 110 Level Of Care: SNF Provider: Inocencio Homes D Emergency Contacts: Extended Emergency Contact Information Primary Emergency Contact: [facility] Address: [address]          [place], [place] [address] [name] of [place] Phone: [phone] Relation: Daughter Secondary Emergency Contact: [name] [place] of [place] Mobile Phone: [phone] Relation: Son  Code Status: FULL  Allergies: Lipitor and Penicillins  Chief Complaint  Patient presents with  . New Admit To SNF    HPI: Patient is 79 y.o. male who is being admitted to SNF as residence because he is having repeated falls at home.  Past Medical History  Diagnosis Date  . History of colon polyps   . Spinal stenosis   . Anxiety   . BPH (benign prostatic hypertrophy)   . ED (erectile dysfunction)   . Osteoporosis   . Hypertension   . Chest pain     "I've had it" (01/31/2012)   . Peripheral vascular disease     "right leg is 100% blocked; left leg has stent, still ~ 75% blocked" (01/31/2012)  . Peripheral neuropathy     "bad" (01/31/2012)  . COPD (chronic obstructive pulmonary disease)   . Emphysema   . BRBPR (bright red blood per rectum)     "don't know what it's from; had some this week" (01/31/2012)  . Chronic lower back pain   . Pernicious anemia   . GERD (gastroesophageal reflux disease)   . Polyneuropathy in other diseases classified elsewhere 11/06/2012  . Prostate cancer 2004    "had 40 treatments of radiation" (01/31/2012)  . Breast cancer in male     "left" (01/31/2012)  . Basal cell carcinoma of nasal tip   . Pneumonia 1960's    "double"   . Cervical spondylosis   . Rheumatoid arthritis(714.0)   . Arthritis     "all over my body" (04/25/2013)  . DDD (degenerative disc disease), lumbosacral     "S1; L5" (01/31/2012)  . Lumbosacral spondylosis without myelopathy  11/06/2012  . Cellulitis of right leg   . Depression     "won't take RX though" (04/25/2013)  . Falls frequently   . Severe malnutrition 11/27/2013  . Hyperlipidemia     Past Surgical History  Procedure Laterality Date  . Basal cell carcinoma excision      Nose x 3  . Cholecystectomy  ?1990's  . Wrist fusion Right     "3 OR's; it's fused" (01/31/2012)  . Excisional hemorrhoidectomy  1960's  . Knee surgery Right      6 surgeries; went in for simple cartilage OR; ended up w/fused knee" (01/31/2012)  . Iliac artery stent  12-07-09    Stent done by Dr. Gwenlyn Found  . Total hip arthroplasty  08/16/2011    Procedure: TOTAL HIP ARTHROPLASTY ANTERIOR APPROACH;  Surgeon: Mcarthur Rossetti, MD;  Location: [facility];  Service: Orthopedics;  Laterality: Right;  Right total hip replacement  . Cervical disc surgery      "7 total; ended w/a fusion" (01/31/2012)  . Esophagogastroduodenoscopy  02/22/2012    Procedure: ESOPHAGOGASTRODUODENOSCOPY (EGD);  Surgeon: Arta Silence, MD;  Location: [facility];  Service: Endoscopy;  Laterality: N/A;  . Eus  02/22/2012    Procedure: UPPER ENDOSCOPIC ULTRASOUND (EUS) RADIAL;  Surgeon: Arta Silence, MD;  Location: [facility];  Service: Endoscopy;  Laterality: N/A;  . Hernia repair    .  Transurethral resection of prostate    . Aortogram  04/25/2013  . Esophagogastroduodenoscopy Left 04/27/2013    Procedure: ESOPHAGOGASTRODUODENOSCOPY (EGD);  Surgeon: Arta Silence, MD;  Location: [facility]COPY;  Service: Endoscopy;  Laterality: Left;  . Femoral-femoral bypass graft Bilateral 05/01/2013    Procedure: BYPASS GRAFT FEMORAL-FEMORAL ARTERY/ LEFT - RIGHT;  Surgeon: Rosetta Posner, MD;  Location: [facility];  Service: Vascular;  Laterality: Bilateral;      Medication List       This list is accurate as of: 01/01/14 11:59 PM.  Always use your most recent med list.               albuterol (2.5 MG/3ML) 0.083% nebulizer solution  Commonly known as:  PROVENTIL  Take 2.5 mg  by nebulization every 4 (four) hours as needed for wheezing or shortness of breath.     ALPRAZolam 1 MG tablet  Commonly known as:  XANAX  Take 1 and 1/2 tablet by mouth up to twice daily as needed     diazepam 5 MG tablet  Commonly known as:  VALIUM  Take one tablet by mouth twice daily and take two tablets by mouth at bedtime for musle spasms     Fluticasone-Salmeterol 250-50 MCG/DOSE Aepb  Commonly known as:  ADVAIR  Inhale 1 puff into the lungs every 12 (twelve) hours.     HYDROmorphone 4 MG tablet  Commonly known as:  DILAUDID  Take 1 tablet (4 mg total) by mouth every 6 (six) hours as needed for severe pain.     predniSONE 10 MG tablet  Commonly known as:  DELTASONE  Take 10 mg by mouth daily with breakfast.     rosuvastatin 20 MG tablet  Commonly known as:  CRESTOR  Take 20 mg by mouth at bedtime.     sertraline 50 MG tablet  Commonly known as:  ZOLOFT  Take 75 mg by mouth at bedtime.     sildenafil 100 MG tablet  Commonly known as:  VIAGRA  Take 100 mg by mouth as needed for erectile dysfunction.     tiotropium 18 MCG inhalation capsule  Commonly known as:  SPIRIVA  Place 1 capsule (18 mcg total) into inhaler and inhale daily.        No orders of the defined types were placed in this encounter.    Immunization History  Administered Date(s) Administered  . Influenza Split 11/15/2011, 11/14/2012  . Influenza-Unspecified 10/15/2012  . Pneumococcal Polysaccharide-23 11/14/2008    History  Substance Use Topics  . Smoking status: Former Smoker -- 2.00 packs/day for 63 years    Types: Cigarettes    Quit date: 04/30/2013  . Smokeless tobacco: Never Used  . Alcohol Use: Yes     Comment: 04/25/2013 "used to be a heavy drinker; stopped drinking in 1975"    Family history is noncontributory    Review of Systems  DATA OBTAINED: from patient GENERAL:  no fevers, fatigue, appetite changes; only c/o is he wants to go home SKIN: No itching, rash or  wounds EYES: No eye pain, redness, discharge EARS: No earache, tinnitus, change in hearing NOSE: No congestion, drainage or bleeding  MOUTH/THROAT: No mouth or tooth pain, No sore throat RESPIRATORY: No cough, wheezing, SOB CARDIAC: No chest pain, palpitations, lower extremity edema  GI: No abdominal pain, No N/V/D or constipation, No heartburn or reflux  GU: No dysuria, frequency or urgency, or incontinence  MUSCULOSKELETAL: No unrelieved bone/joint pain NEUROLOGIC: No headache, dizziness or focal weakness  PSYCHIATRIC: No overt anxiety or sadness, No behavior issue.   Filed Vitals:   01/01/14 1241  BP: 117/73  Pulse: 58  Temp: 98 F (36.7 C)  Resp: 18    Physical Exam  GENERAL APPEARANCE: Alert, conversant,  No acute distress.  SKIN: No diaphoresis rash HEAD: Normocephalic, atraumatic  EYES: Conjunctiva/lids clear. Pupils round, reactive. EOMs intact.  EARS: External exam WNL, canals clear. Hearing grossly normal.  NOSE: No deformity or discharge.  MOUTH/THROAT: Lips w/o lesions  RESPIRATORY: Breathing is even, unlabored. Lung sounds are clear   CARDIOVASCULAR: Heart RRR no murmurs, rubs or gallops. No peripheral edema.   GASTROINTESTINAL: Abdomen is soft, non-tender, not distended w/ normal bowel sounds. GENITOURINARY: Bladder non tender, not distended  MUSCULOSKELETAL: No abnormal joints or musculature NEUROLOGIC:  Cranial nerves 2-12 grossly intact. Moves all extremities  PSYCHIATRIC:  no behavioral issues  Patient Active Problem List   Diagnosis Date Noted  . Chronic back pain 12/04/2013  . Hyperlipidemia   . Severe malnutrition 11/27/2013  . Atherosclerotic PVD with ulceration 10/08/2013  . Pain of right lower leg 08/20/2013  . Pain 06/24/2013  . Insomnia 06/12/2013  . Duodenal ulcer 05/18/2013  . Perirectal abscess 05/18/2013  . Rheumatoid arthritis 05/18/2013  . Cervical spondylosis   . Anemia B twelve deficiency 04/27/2013  . GI bleed 04/26/2013  .  Protein-calorie malnutrition, severe 04/26/2013  . Dysphagia, unspecified(787.20) 04/24/2013  . Diarrhea 04/24/2013  . Cough 04/24/2013  . Cellulitis 04/20/2013  . Sepsis 04/20/2013  . Pneumonia, organism unspecified 01/25/2013  . Polyneuropathy in other diseases classified elsewhere 11/06/2012  . Lumbosacral spondylosis without myelopathy 11/06/2012  . Pain localized to upper abdomen 02/03/2012  . Depression 01/31/2012  . Failure to thrive 01/31/2012  . Cachexia 01/31/2012  . COPD exacerbation 01/31/2012  . Pancreatic mass 01/31/2012  . Weight loss 01/31/2012  . Syncope 08/14/2011  . Fall 08/14/2011  . Femur fracture 08/14/2011  . Thrombocytopenia 08/14/2011  . COPD (chronic obstructive pulmonary disease) 08/14/2011  . PVD (peripheral vascular disease) 08/14/2011  . Rheumatoid arthritis 08/14/2011  . Anxiety 08/14/2011  . Chronic total occlusion of artery of the extremities 05/10/2011    CBC    Component Value Date/Time   WBC 8.0 05/10/2013 1121   RBC 3.41* 05/10/2013 1121   RBC 2.92* 08/17/2011 0645   HGB 11.0* 05/10/2013 1121   HCT 32.7* 05/10/2013 1121   PLT  05/10/2013 1121    PLATELET CLUMPS NOTED ON SMEAR, COUNT APPEARS ADEQUATE   MCV 95.9 05/10/2013 1121   LYMPHSABS 0.7 01/31/2012 2006   MONOABS 0.3 01/31/2012 2006   EOSABS 0.0 01/31/2012 2006   BASOSABS 0.0 01/31/2012 2006    CMP     Component Value Date/Time   NA 136* 05/10/2013 1121   K 4.9 05/10/2013 1121   CL 97 05/10/2013 1121   CO2 29 05/10/2013 1121   GLUCOSE 112* 05/10/2013 1121   BUN 12 05/10/2013 1121   CREATININE 0.91 05/10/2013 1121   CALCIUM 8.9 05/10/2013 1121   PROT 5.9* 04/21/2013 0615   ALBUMIN 2.5* 04/21/2013 0615   AST 44* 04/21/2013 0615   ALT 10 04/21/2013 0615   ALKPHOS 79 04/21/2013 0615   BILITOT 1.2 04/21/2013 0615   GFRNONAA 80* 05/10/2013 1121   GFRAA >90 05/10/2013 1121    Assessment and Plan  Chronic back pain Pt is on dilaudid which is a big medicine for  chronic pain. Pt was d/c to home so I don't think went to a pain clinic. Will  ask for that to be set up. Pt is on valium as muscle relaxer  COPD (chronic obstructive pulmonary disease) Stable and chronic;will continue home inhalers and prednisone.  Depression Continue pt's zoloft and xanax  Hyperlipidemia Continue crestor  PVD (peripheral vascular disease) Sees vascular;is on statin    Hennie Duos, MD

## 2014-01-03 ENCOUNTER — Encounter: Payer: Self-pay | Admitting: Internal Medicine

## 2014-01-03 NOTE — Assessment & Plan Note (Signed)
Continue pt's zoloft and xanax

## 2014-01-03 NOTE — Assessment & Plan Note (Signed)
Pt is on dilaudid which is a big medicine for chronic pain. Pt was d/c to home so I don't think went to a pain clinic. Will ask for that to be set up. Pt is on valium as muscle relaxer

## 2014-01-03 NOTE — Assessment & Plan Note (Signed)
Stable and chronic;will continue home inhalers and prednisone.

## 2014-01-03 NOTE — Assessment & Plan Note (Signed)
Continue crestor 

## 2014-01-03 NOTE — Assessment & Plan Note (Signed)
Sees vascular;is on statin

## 2014-01-06 ENCOUNTER — Non-Acute Institutional Stay (SKILLED_NURSING_FACILITY): Payer: Medicare Other | Admitting: Internal Medicine

## 2014-01-06 ENCOUNTER — Encounter: Payer: Self-pay | Admitting: Internal Medicine

## 2014-01-06 DIAGNOSIS — I70209 Unspecified atherosclerosis of native arteries of extremities, unspecified extremity: Secondary | ICD-10-CM

## 2014-01-06 DIAGNOSIS — I739 Peripheral vascular disease, unspecified: Secondary | ICD-10-CM

## 2014-01-06 DIAGNOSIS — G8929 Other chronic pain: Secondary | ICD-10-CM

## 2014-01-06 DIAGNOSIS — E785 Hyperlipidemia, unspecified: Secondary | ICD-10-CM

## 2014-01-06 DIAGNOSIS — L98499 Non-pressure chronic ulcer of skin of other sites with unspecified severity: Secondary | ICD-10-CM

## 2014-01-06 DIAGNOSIS — J42 Unspecified chronic bronchitis: Secondary | ICD-10-CM

## 2014-01-06 DIAGNOSIS — M549 Dorsalgia, unspecified: Secondary | ICD-10-CM

## 2014-01-06 NOTE — Progress Notes (Signed)
Patient ID: [name], male   DOB: 1934/05/21, 79 y.o.   MRN: [id number]    .     MRN: [id number] Name: [name]  Sex: male Age: 79 y.o. DOB: February 05, 1935  [place] #: [name] Facility/Room: 110 Level Of Care: SNF Provider: Wille Celeste Emergency Contacts: Extended Emergency Contact Information Primary Emergency Contact: [facility] Address: [address]          [place], [place] [address] [name] of [place] Phone: [phone] Relation: Daughter Secondary Emergency Contact: [name] [place] of [place] Mobile Phone: [phone] Relation: Son  Code Status: FULL  Allergies: Lipitor and Penicillins  Chief Complaint  Patient presents with  . Discharge Note    HPI: Patient is 79 y.o. male  Was here short-term for rehabilitation- apparently had falls at home-he had been here previously and went home but was readmitted secondary to recurrent falls- patient has been discharged by therapy -there has been discussion about assisted living placement but patient is resistant to this apparently.   He states that he will be more careful this time he feels he gets in trouble at home when he rushes things and we will try not to do that anymore.   He will need continued PT and OT support as well as social work support and a CNA to help as well data.   Social services also will contact Adult Protective Services prior to discharge as well    currently he has no complaints- he is receiving oxycodone as needed for pain apparently this is helping with his generalized pain.    Past Medical History  Diagnosis Date  . History of colon polyps   . Spinal stenosis   . Anxiety   . BPH (benign prostatic hypertrophy)   . ED (erectile dysfunction)   . Osteoporosis   . Hypertension   . Chest pain     "I've had it" (01/31/2012)   . Peripheral vascular disease     "right leg is 100% blocked; left leg has stent, still ~ 75% blocked" (01/31/2012)  . Peripheral  neuropathy     "bad" (01/31/2012)  . COPD (chronic obstructive pulmonary disease)   . Emphysema   . BRBPR (bright red blood per rectum)     "don't know what it's from; had some this week" (01/31/2012)  . Chronic lower back pain   . Pernicious anemia   . GERD (gastroesophageal reflux disease)   . Polyneuropathy in other diseases classified elsewhere 11/06/2012  . Prostate cancer 2004    "had 40 treatments of radiation" (01/31/2012)  . Breast cancer in male     "left" (01/31/2012)  . Basal cell carcinoma of nasal tip   . Pneumonia 1960's    "double"   . Cervical spondylosis   . Rheumatoid arthritis(714.0)   . Arthritis     "all over my body" (04/25/2013)  . DDD (degenerative disc disease), lumbosacral     "S1; L5" (01/31/2012)  . Lumbosacral spondylosis without myelopathy 11/06/2012  . Cellulitis of right leg   . Depression     "won't take RX though" (04/25/2013)  . Falls frequently   . Severe malnutrition 11/27/2013  . Hyperlipidemia     Past Surgical History  Procedure Laterality Date  . Basal cell carcinoma excision      Nose x 3  . Cholecystectomy  ?1990's  . Wrist fusion Right     "3 OR's; it's fused" (01/31/2012)  . Excisional hemorrhoidectomy  1960's  . Knee  surgery Right      6 surgeries; went in for simple cartilage OR; ended up w/fused knee" (01/31/2012)  . Iliac artery stent  12-07-09    Stent done by Dr. Gwenlyn Found  . Total hip arthroplasty  08/16/2011    Procedure: TOTAL HIP ARTHROPLASTY ANTERIOR APPROACH;  Surgeon: Mcarthur Rossetti, MD;  Location: [place];  Service: Orthopedics;  Laterality: Right;  Right total hip replacement  . Cervical disc surgery      "7 total; ended w/a fusion" (01/31/2012)  . Esophagogastroduodenoscopy  02/22/2012    Procedure: ESOPHAGOGASTRODUODENOSCOPY (EGD);  Surgeon: Arta Silence, MD;  Location: [facility];  Service: Endoscopy;  Laterality: N/A;  . Eus  02/22/2012    Procedure: UPPER ENDOSCOPIC ULTRASOUND (EUS) RADIAL;  Surgeon:  Arta Silence, MD;  Location: [facility];  Service: Endoscopy;  Laterality: N/A;  . Hernia repair    . Transurethral resection of prostate    . Aortogram  04/25/2013  . Esophagogastroduodenoscopy Left 04/27/2013    Procedure: ESOPHAGOGASTRODUODENOSCOPY (EGD);  Surgeon: Arta Silence, MD;  Location: [facility];  Service: Endoscopy;  Laterality: Left;  . Femoral-femoral bypass graft Bilateral 05/01/2013    Procedure: BYPASS GRAFT FEMORAL-FEMORAL ARTERY/ LEFT - RIGHT;  Surgeon: Rosetta Posner, MD;  Location: [place];  Service: Vascular;  Laterality: Bilateral;      Medication List       This list is accurate as of: 01/06/14  2:16 PM.  Always use your most recent med list.               albuterol (2.5 MG/3ML) 0.083% nebulizer solution  Commonly known as:  PROVENTIL  Take 2.5 mg by nebulization every 4 (four) hours as needed for wheezing or shortness of breath.     ALPRAZolam 1 MG tablet  Commonly known as:  XANAX  Take 1 and 1/2 tablet by mouth up to twice daily as needed     diazepam 5 MG tablet  Commonly known as:  VALIUM  Take one tablet by mouth twice daily and take two tablets by mouth at bedtime for musle spasms     Fluticasone-Salmeterol 250-50 MCG/DOSE Aepb  Commonly known as:  ADVAIR  Inhale 1 puff into the lungs every 12 (twelve) hours.     HYDROmorphone 4 MG tablet  Commonly known as:  DILAUDID  Take 1 tablet (4 mg total) by mouth every 6 (six) hours as needed for severe pain.     predniSONE 10 MG tablet  Commonly known as:  DELTASONE  Take 10 mg by mouth daily with breakfast.     rosuvastatin 20 MG tablet  Commonly known as:  CRESTOR  Take 20 mg by mouth at bedtime.     sertraline 50 MG tablet  Commonly known as:  ZOLOFT  Take 75 mg by mouth at bedtime.     sildenafil 100 MG tablet  Commonly known as:  VIAGRA  Take 100 mg by mouth as needed for erectile dysfunction.     tiotropium 18 MCG inhalation capsule  Commonly known as:  SPIRIVA  Place 1 capsule  (18 mcg total) into inhaler and inhale daily.       of note there have been medication changes made his Dilaudid has been discontinued secondary of patient's  Stating it does not help-he has been started on oxycodone 15 mg every 6 hours when necessary pain and apparently this is helping--   Again his Dilaudid has been discontinued  No orders of the defined types were placed in  this encounter.    Immunization History  Administered Date(s) Administered  . Influenza Split 11/15/2011, 11/14/2012  . Influenza-Unspecified 10/15/2012  . Pneumococcal Polysaccharide-23 11/14/2008    History  Substance Use Topics  . Smoking status: Former Smoker -- 2.00 packs/day for 63 years    Types: Cigarettes    Quit date: 04/30/2013  . Smokeless tobacco: Never Used  . Alcohol Use: Yes     Comment: 04/25/2013 "used to be a heavy drinker; stopped drinking in 1975"    Family history is noncontributory    Review of Systems  DATA OBTAINED: from patient GENERAL:  no fevers, fatigue, appetite changes;  SKIN: No itching, rash or wounds EYES: No eye pain, redness, discharge EARS: No earache, tinnitus, change in hearing NOSE: No congestion, drainage or bleeding  MOUTH/THROAT: No mouth or tooth pain, No sore throat RESPIRATORY: No cough, wheezing, SOB CARDIAC: No chest pain, palpitations, lower extremity edema  GI: No abdominal pain, No N/V/D or constipation, No heartburn or reflux  GU: No dysuria, frequency or urgency, or incontinence  MUSCULOSKELETAL: No unrelieved bone/joint pain-- apparently the oxycodone is helping again he is off Dilaudid NEUROLOGIC: No headache, dizziness or focal weakness PSYCHIATRIC: No overt anxiety or sadness, No behavior issue.   Filed Vitals:   01/06/14 1410  BP: 113/56  Pulse: 64  Temp: 96.4 F (35.8 C)  Resp: 16    Physical Exam  temperature 96.4 pulse 64 respirations 16 blood pressure 113/56 GENERAL APPEARANCE: Alert, conversant,  No acute distress.  SKIN: No  diaphoresis rash-- small wound on his left lower legcovered with dry dressing HEAD: Normocephalic, atraumatic  EYES: Conjunctiva/lids clear. Pupils round, reactive. EOMs intact.  EARS: External exam WNL, canals clear. Hearing grossly normal.  NOSE: No deformity or discharge.  MOUTH/THROAT: Lips w/o lesions  RESPIRATORY: Breathing is even, unlabored. Lung sounds are clear   CARDIOVASCULAR: Heart RRR no murmurs, rubs or gallops. No peripheral edema.   GASTROINTESTINAL: Abdomen is soft, non-tender, not distended w/ normal bowel sounds. GENITOURINARY: Bladder non tender, not distended  MUSCULOSKELETAL: No abnormal joints or musculature ambulates fairly well with a rolling walker NEUROLOGIC:  Cranial nerves 2-12 grossly intact. Moves all extremities  PSYCHIATRIC:  no behavioral issues  Patient Active Problem List   Diagnosis Date Noted  . Chronic back pain 12/04/2013  . Hyperlipidemia   . Severe malnutrition 11/27/2013  . Atherosclerotic PVD with ulceration 10/08/2013  . Pain of right lower leg 08/20/2013  . Pain 06/24/2013  . Insomnia 06/12/2013  . Duodenal ulcer 05/18/2013  . Perirectal abscess 05/18/2013  . Rheumatoid arthritis 05/18/2013  . Cervical spondylosis   . Anemia B twelve deficiency 04/27/2013  . GI bleed 04/26/2013  . Protein-calorie malnutrition, severe 04/26/2013  . Dysphagia, unspecified(787.20) 04/24/2013  . Diarrhea 04/24/2013  . Cough 04/24/2013  . Cellulitis 04/20/2013  . Sepsis 04/20/2013  . Pneumonia, organism unspecified 01/25/2013  . Polyneuropathy in other diseases classified elsewhere 11/06/2012  . Lumbosacral spondylosis without myelopathy 11/06/2012  . Pain localized to upper abdomen 02/03/2012  . Depression 01/31/2012  . Failure to thrive 01/31/2012  . Cachexia 01/31/2012  . COPD exacerbation 01/31/2012  . Pancreatic mass 01/31/2012  . Weight loss 01/31/2012  . Syncope 08/14/2011  . Fall 08/14/2011  . Femur fracture 08/14/2011  .  Thrombocytopenia 08/14/2011  . COPD (chronic obstructive pulmonary disease) 08/14/2011  . PVD (peripheral vascular disease) 08/14/2011  . Rheumatoid arthritis 08/14/2011  . Anxiety 08/14/2011  . Chronic total occlusion of artery of the extremities 05/10/2011  labs.      Component Value Date/Time   WBC 8.0 05/10/2013 1121   RBC 3.41* 05/10/2013 1121   RBC 2.92* 08/17/2011 0645   HGB 11.0* 05/10/2013 1121   HCT 32.7* 05/10/2013 1121   PLT  05/10/2013 1121    PLATELET CLUMPS NOTED ON SMEAR, COUNT APPEARS ADEQUATE   MCV 95.9 05/10/2013 1121   LYMPHSABS 0.7 01/31/2012 2006   MONOABS 0.3 01/31/2012 2006   EOSABS 0.0 01/31/2012 2006   BASOSABS 0.0 01/31/2012 2006    CMP     Component Value Date/Time   NA 136* 05/10/2013 1121   K 4.9 05/10/2013 1121   CL 97 05/10/2013 1121   CO2 29 05/10/2013 1121   GLUCOSE 112* 05/10/2013 1121   BUN 12 05/10/2013 1121   CREATININE 0.91 05/10/2013 1121   CALCIUM 8.9 05/10/2013 1121   PROT 5.9* 04/21/2013 0615   ALBUMIN 2.5* 04/21/2013 0615   AST 44* 04/21/2013 0615   ALT 10 04/21/2013 0615   ALKPHOS 79 04/21/2013 0615   BILITOT 1.2 04/21/2013 0615   GFRNONAA 80* 05/10/2013 1121   GFRAA >90 05/10/2013 1121    Assessment and Plan  No problem-specific assessment & plan notes found for this encounter.   Wille Celeste, MD    HPI: Patient is 79 y.o. male who is being admitted to SNF as residence because he is having repeated falls at home.  Past Medical History  Diagnosis Date  . History of colon polyps   . Spinal stenosis   . Anxiety   . BPH (benign prostatic hypertrophy)   . ED (erectile dysfunction)   . Osteoporosis   . Hypertension   . Chest pain     "I've had it" (01/31/2012)   . Peripheral vascular disease     "right leg is 100% blocked; left leg has stent, still ~ 75% blocked" (01/31/2012)  . Peripheral neuropathy     "bad" (01/31/2012)  . COPD (chronic obstructive pulmonary disease)   . Emphysema   . BRBPR  (bright red blood per rectum)     "don't know what it's from; had some this week" (01/31/2012)  . Chronic lower back pain   . Pernicious anemia   . GERD (gastroesophageal reflux disease)   . Polyneuropathy in other diseases classified elsewhere 11/06/2012  . Prostate cancer 2004    "had 40 treatments of radiation" (01/31/2012)  . Breast cancer in male     "left" (01/31/2012)  . Basal cell carcinoma of nasal tip   . Pneumonia 1960's    "double"   . Cervical spondylosis   . Rheumatoid arthritis(714.0)   . Arthritis     "all over my body" (04/25/2013)  . DDD (degenerative disc disease), lumbosacral     "S1; L5" (01/31/2012)  . Lumbosacral spondylosis without myelopathy 11/06/2012  . Cellulitis of right leg   . Depression     "won't take RX though" (04/25/2013)  . Falls frequently   . Severe malnutrition 11/27/2013  . Hyperlipidemia     Past Surgical History  Procedure Laterality Date  . Basal cell carcinoma excision      Nose x 3  . Cholecystectomy  ?1990's  . Wrist fusion Right     "3 OR's; it's fused" (01/31/2012)  . Excisional hemorrhoidectomy  1960's  . Knee surgery Right      6 surgeries; went in for simple cartilage OR; ended up w/fused knee" (01/31/2012)  . Iliac artery stent  12-07-09    Stent done by Dr.  Berry  . Total hip arthroplasty  08/16/2011    Procedure: TOTAL HIP ARTHROPLASTY ANTERIOR APPROACH;  Surgeon: Mcarthur Rossetti, MD;  Location: [place];  Service: Orthopedics;  Laterality: Right;  Right total hip replacement  . Cervical disc surgery      "7 total; ended w/a fusion" (01/31/2012)  . Esophagogastroduodenoscopy  02/22/2012    Procedure: ESOPHAGOGASTRODUODENOSCOPY (EGD);  Surgeon: Arta Silence, MD;  Location: [facility];  Service: Endoscopy;  Laterality: N/A;  . Eus  02/22/2012    Procedure: UPPER ENDOSCOPIC ULTRASOUND (EUS) RADIAL;  Surgeon: Arta Silence, MD;  Location: [facility];  Service: Endoscopy;  Laterality: N/A;  . Hernia repair    .  Transurethral resection of prostate    . Aortogram  04/25/2013  . Esophagogastroduodenoscopy Left 04/27/2013    Procedure: ESOPHAGOGASTRODUODENOSCOPY (EGD);  Surgeon: Arta Silence, MD;  Location[facility] ENDOSCOPY;  Service: Endoscopy;  Laterality: Left;  . Femoral-femoral bypass graft Bilateral 05/01/2013    Procedure: BYPASS GRAFT FEMORAL-FEMORAL ARTERY/ LEFT - RIGHT;  Surgeon: Rosetta Posner, MD;  Location: [facility];  Service: Vascular;  Laterality: Bilateral;      Medication List       This list is accurate as of: 01/01/14 11:59 PM.  Always use your most recent med list.               albuterol (2.5 MG/3ML) 0.083% nebulizer solution  Commonly known as:  PROVENTIL  Take 2.5 mg by nebulization every 4 (four) hours as needed for wheezing or shortness of breath.     ALPRAZolam 1 MG tablet  Commonly known as:  XANAX  Take 1 and 1/2 tablet by mouth up to twice daily as needed     diazepam 5 MG tablet  Commonly known as:  VALIUM  Take one tablet by mouth twice daily and take two tablets by mouth at bedtime for musle spasms     Fluticasone-Salmeterol 250-50 MCG/DOSE Aepb  Commonly known as:  ADVAIR  Inhale 1 puff into the lungs every 12 (twelve) hours.     HYDROmorphone 4 MG tablet  Commonly known as:  DILAUDID  Take 1 tablet (4 mg total) by mouth every 6 (six) hours as needed for severe pain.     predniSONE 10 MG tablet  Commonly known as:  DELTASONE  Take 10 mg by mouth daily with breakfast.     rosuvastatin 20 MG tablet  Commonly known as:  CRESTOR  Take 20 mg by mouth at bedtime.     sertraline 50 MG tablet  Commonly known as:  ZOLOFT  Take 75 mg by mouth at bedtime.     sildenafil 100 MG tablet  Commonly known as:  VIAGRA  Take 100 mg by mouth as needed for erectile dysfunction.     tiotropium 18 MCG inhalation capsule  Commonly known as:  SPIRIVA  Place 1 capsule (18 mcg total) into inhaler and inhale daily.        No orders of the defined types were placed in this  encounter.    Immunization History  Administered Date(s) Administered  . Influenza Split 11/15/2011, 11/14/2012  . Influenza-Unspecified 10/15/2012  . Pneumococcal Polysaccharide-23 11/14/2008    History  Substance Use Topics  . Smoking status: Former Smoker -- 2.00 packs/day for 63 years    Types: Cigarettes    Quit date: 04/30/2013  . Smokeless tobacco: Never Used  . Alcohol Use: Yes     Comment: 04/25/2013 "used to be a heavy drinker; stopped drinking in  1975"    Family history is noncontributory    Review of Systems  DATA OBTAINED: from patient GENERAL:  no fevers, fatigue, appetite changes; only c/o is he wants to go home SKIN: No itching, rash or wounds EYES: No eye pain, redness, discharge EARS: No earache, tinnitus, change in hearing NOSE: No congestion, drainage or bleeding  MOUTH/THROAT: No mouth or tooth pain, No sore throat RESPIRATORY: No cough, wheezing, SOB CARDIAC: No chest pain, palpitations, lower extremity edema  GI: No abdominal pain, No N/V/D or constipation, No heartburn or reflux  GU: No dysuria, frequency or urgency, or incontinence  MUSCULOSKELETAL: No unrelieved bone/joint pain NEUROLOGIC: No headache, dizziness or focal weakness PSYCHIATRIC: No overt anxiety or sadness, No behavior issue.   Filed Vitals:   01/01/14 1241  BP: 117/73  Pulse: 58  Temp: 98 F (36.7 C)  Resp: 18    Physical Exam  GENERAL APPEARANCE: Alert, conversant,  No acute distress.  SKIN: No diaphoresis rash HEAD: Normocephalic, atraumatic  EYES: Conjunctiva/lids clear. Pupils round, reactive. EOMs intact.  EARS: External exam WNL, canals clear. Hearing grossly normal.  NOSE: No deformity or discharge.  MOUTH/THROAT: Lips w/o lesions  RESPIRATORY: Breathing is even, unlabored. Lung sounds are clear   CARDIOVASCULAR: Heart RRR no murmurs, rubs or gallops. No peripheral edema.   GASTROINTESTINAL: Abdomen is soft, non-tender, not distended w/ normal bowel  sounds. GENITOURINARY: Bladder non tender, not distended  MUSCULOSKELETAL: No abnormal joints or musculature NEUROLOGIC:  Cranial nerves 2-12 grossly intact. Moves all extremities  PSYCHIATRIC:  no behavioral issues  Patient Active Problem List   Diagnosis Date Noted  . Chronic back pain 12/04/2013  . Hyperlipidemia   . Severe malnutrition 11/27/2013  . Atherosclerotic PVD with ulceration 10/08/2013  . Pain of right lower leg 08/20/2013  . Pain 06/24/2013  . Insomnia 06/12/2013  . Duodenal ulcer 05/18/2013  . Perirectal abscess 05/18/2013  . Rheumatoid arthritis 05/18/2013  . Cervical spondylosis   . Anemia B twelve deficiency 04/27/2013  . GI bleed 04/26/2013  . Protein-calorie malnutrition, severe 04/26/2013  . Dysphagia, unspecified(787.20) 04/24/2013  . Diarrhea 04/24/2013  . Cough 04/24/2013  . Cellulitis 04/20/2013  . Sepsis 04/20/2013  . Pneumonia, organism unspecified 01/25/2013  . Polyneuropathy in other diseases classified elsewhere 11/06/2012  . Lumbosacral spondylosis without myelopathy 11/06/2012  . Pain localized to upper abdomen 02/03/2012  . Depression 01/31/2012  . Failure to thrive 01/31/2012  . Cachexia 01/31/2012  . COPD exacerbation 01/31/2012  . Pancreatic mass 01/31/2012  . Weight loss 01/31/2012  . Syncope 08/14/2011  . Fall 08/14/2011  . Femur fracture 08/14/2011  . Thrombocytopenia 08/14/2011  . COPD (chronic obstructive pulmonary disease) 08/14/2011  . PVD (peripheral vascular disease) 08/14/2011  . Rheumatoid arthritis 08/14/2011  . Anxiety 08/14/2011  . Chronic total occlusion of artery of the extremities 05/10/2011    CBC    Component Value Date/Time   WBC 8.0 05/10/2013 1121   RBC 3.41* 05/10/2013 1121   RBC 2.92* 08/17/2011 0645   HGB 11.0* 05/10/2013 1121   HCT 32.7* 05/10/2013 1121   PLT  05/10/2013 1121    PLATELET CLUMPS NOTED ON SMEAR, COUNT APPEARS ADEQUATE   MCV 95.9 05/10/2013 1121   LYMPHSABS 0.7 01/31/2012 2006    MONOABS 0.3 01/31/2012 2006   EOSABS 0.0 01/31/2012 2006   BASOSABS 0.0 01/31/2012 2006    CMP     Component Value Date/Time   NA 136* 05/10/2013 1121   K 4.9 05/10/2013 1121   CL 97  05/10/2013 1121   CO2 29 05/10/2013 1121   GLUCOSE 112* 05/10/2013 1121   BUN 12 05/10/2013 1121   CREATININE 0.91 05/10/2013 1121   CALCIUM 8.9 05/10/2013 1121   PROT 5.9* 04/21/2013 0615   ALBUMIN 2.5* 04/21/2013 0615   AST 44* 04/21/2013 0615   ALT 10 04/21/2013 0615   ALKPHOS 79 04/21/2013 0615   BILITOT 1.2 04/21/2013 0615   GFRNONAA 80* 05/10/2013 1121   GFRAA >90 05/10/2013 1121    Assessment and Plan  Chronic back pain  apparently this has improved somewhat he is now receiving the oxycodone asked Dilaudid has been disontinued- he has been followedat the pain clinic . Pt is on valium as muscle relaxer  COPD (chronic obstructive pulmonary disease) Stable and chronic;will continue home inhalers and prednisone.  Depression Continue pt's zoloft and xanax  Hyperlipidemia Continue crestor  PVD (peripheral vascular disease) Sees vascular;is on statin   of note Will update CBC and metabolic panel before discharge -notify primary care provider of results.   he will need follow-up including nursing follow-up for his medical issues and weakness- as well as CNA support with his history of falls to help with his activities. Also will need social worker inputas well as PT and OT for continued strengtheningagain facility also will contact Adult Protective Services before discharge   I believe assisted living would be a better option but patient is resistant to this apparently there are financial issues -possibly some social issues- again he will need strong follow-up with social worker follow-up as well as help as noted above  He does state he feels he gets in a hurry and this leads to falls and is going to try to do better . [id number] note greater than 35 minutes spent on this discharge  summary-scripts have been written

## 2014-01-07 ENCOUNTER — Encounter: Payer: Self-pay | Admitting: Neurology

## 2014-01-17 NOTE — Telephone Encounter (Signed)
Called patient back because [facility] still had not received notes. Patient states he does not want to go to pain management any longer.

## 2014-01-23 ENCOUNTER — Encounter (HOSPITAL_COMMUNITY): Payer: Self-pay | Admitting: Vascular Surgery

## 2014-01-29 ENCOUNTER — Telehealth: Payer: Self-pay | Admitting: Neurology

## 2014-01-29 NOTE — Telephone Encounter (Signed)
Harmon Pier with [facility] is calling regarding patient. Harmon Pier needs to review patient's medication list. Please call. Thank you.

## 2014-01-31 NOTE — Telephone Encounter (Signed)
Left message for Harmon Pier to return call and I relayed that if needed I can fax her an updated medication list for the patient.

## 2014-02-03 ENCOUNTER — Other Ambulatory Visit: Payer: Self-pay | Admitting: Neurology

## 2014-02-03 ENCOUNTER — Other Ambulatory Visit: Payer: Self-pay | Admitting: Gastroenterology

## 2014-02-03 DIAGNOSIS — R1310 Dysphagia, unspecified: Secondary | ICD-10-CM

## 2014-02-03 MED ORDER — OXYCODONE HCL 30 MG PO TABS: 30.0000 mg | ORAL_TABLET | ORAL | Status: DC | PRN

## 2014-02-03 NOTE — Telephone Encounter (Signed)
Pt is calling requesting a written Rx for oxyCODONE (ROXICODONE) 15 MG immediate release tablet.  He would like to pick it up tomorrow if possible, he has an appointment close by.  If not he needs to pick it up on Wednesday because he is going out of town for the holidays. Please call and advise.

## 2014-02-03 NOTE — Telephone Encounter (Signed)
Dr Jannifer Franklin is out of the office.  Request entered, forwarded to [facility] for approval.  Rx last written 11/11 per Dr Jannifer Franklin' note.

## 2014-02-10 ENCOUNTER — Other Ambulatory Visit: Payer: Medicare Other

## 2014-02-11 ENCOUNTER — Other Ambulatory Visit: Payer: Medicare Other

## 2014-02-12 ENCOUNTER — Other Ambulatory Visit: Payer: Self-pay | Admitting: Physician Assistant

## 2014-02-12 DIAGNOSIS — R2232 Localized swelling, mass and lump, left upper limb: Secondary | ICD-10-CM

## 2014-02-13 ENCOUNTER — Other Ambulatory Visit: Payer: Medicare Other

## 2014-02-17 ENCOUNTER — Other Ambulatory Visit: Payer: Medicare Other

## 2014-02-18 ENCOUNTER — Other Ambulatory Visit: Payer: Medicare Other

## 2014-02-18 ENCOUNTER — Inpatient Hospital Stay: Admission: RE | Admit: 2014-02-18 | Payer: Self-pay | Source: Ambulatory Visit

## 2014-02-27 ENCOUNTER — Ambulatory Visit: Payer: Medicare Other | Admitting: Neurology

## 2014-02-27 ENCOUNTER — Encounter (HOSPITAL_COMMUNITY): Payer: Self-pay | Admitting: Surgery

## 2014-03-03 ENCOUNTER — Telehealth: Payer: Self-pay

## 2014-03-03 ENCOUNTER — Other Ambulatory Visit: Payer: Self-pay | Admitting: Neurology

## 2014-03-03 MED ORDER — OXYCODONE HCL 30 MG PO TABS: 30.0000 mg | ORAL_TABLET | ORAL | Status: DC | PRN

## 2014-03-03 NOTE — Telephone Encounter (Signed)
Request entered, forwarded to provider for approval.  

## 2014-03-03 NOTE — Telephone Encounter (Signed)
Pt calling to request refill on oxycodone (ROXICODONE) 30 MG immediate release tablet. Please call when ready for pick up.

## 2014-03-03 NOTE — Telephone Encounter (Signed)
Called patient and informed Rx ready for pick up at front desk. Patient verbalized understanding.  

## 2014-03-13 ENCOUNTER — Encounter: Payer: Self-pay | Admitting: Internal Medicine

## 2014-03-13 ENCOUNTER — Ambulatory Visit (INDEPENDENT_AMBULATORY_CARE_PROVIDER_SITE_OTHER): Payer: Medicare Other | Admitting: Internal Medicine

## 2014-03-13 VITALS — BP 150/74 | HR 70 | Ht 67.0 in | Wt 111.0 lb

## 2014-03-13 DIAGNOSIS — Z604 Social exclusion and rejection: Secondary | ICD-10-CM

## 2014-03-13 DIAGNOSIS — J449 Chronic obstructive pulmonary disease, unspecified: Secondary | ICD-10-CM

## 2014-03-13 DIAGNOSIS — J441 Chronic obstructive pulmonary disease with (acute) exacerbation: Secondary | ICD-10-CM

## 2014-03-13 MED ORDER — SERTRALINE HCL 50 MG PO TABS: 75.0000 mg | ORAL_TABLET | Freq: Every day | ORAL | Status: DC

## 2014-03-13 MED ORDER — DOXYCYCLINE HYCLATE 100 MG PO TABS: 100.0000 mg | ORAL_TABLET | Freq: Two times a day (BID) | ORAL | Status: DC

## 2014-03-13 NOTE — Progress Notes (Signed)
Subjective:    Patient ID: [name], male    DOB: 1935/01/21, 80 y.o.   MRN: [id number]  HPI  #COPD  - pirometry 06/25/12"  fev1 Pos BD fev1 1.17L/42% (+5% bd response), FVC 3.4L/88%, R 34, DLCO 36% and is consistent with Gold stage III COPD.  #CAchexia/ sever malnutrition  - Body mass index is 17.38 kg/(m^2). 03/13/2014   #Chronic pain   OV 03/13/2014  Chief Complaint  Patient presents with  . Follow-up    Pt c/o increase in SOB, prod cough with clear mucus, chest congestion and chest tightness x 1 month. pt stated his s/s are worsening.     80 year old male with severe COPD, cachexia/severe malnutrition, chronic pain and significant debility. Presents for follow-up last seen in our office this summer 2014 for COPD exacerbation. I do not know the details of his absence for one year. He says that in the last month or so he's had increased cough, sputum production, change in color of sputum to green, chest tightness that is worse, increased wheezing. He feels and is in a chronic state of exacerbation. He was started by his primary care physician on prednisone and is in the midst of a taper of this is not fully helping.also find out that he is not using his Spiriva and Advair because of financial constraints.  He is also dealing with chronic pain, loneliness and severe COPD and cachexia that is limiting his mobility and independence and mood. He is describing for the first time a strong sense that his prognosis is limited and that he will die soon from a COPD   CAT COPD Symptom & Quality of Life Score ([place]) 0 is no burden. 5 is highest burden 02/27/2012  06/25/2012  09/13/2012  03/13/2014   Never Cough -> Cough all the time 2 1 2    No phlegm in chest -> Chest is full of phlegm 3 3 3    No chest tightness -> Chest feels very tight 4 3 2    No dyspnea for 1 flight stairs/hill -> Very dyspneic for 1 flight of stairs 5 5 4    No limitations for ADL at home -> Very limited with  ADL at home 5 5 5    Confident leaving home -> Not at all confident leaving home 5 3 4    Sleep soundly -> Do not sleep soundly because of lung condition 4 4 4    Lots of Energy -> No energy at all 4 4 3    TOTAL Score (max 40)  32 28 27   BMI  Body mass index is 17.64 kg/(m^2).  Body mass index is 18.22 kg/(m^2).  Body mass index is 17.38 kg/(m^2).       Review of Systems  Constitutional: Positive for fatigue. Negative for fever and unexpected weight change.  HENT: Negative for congestion, dental problem, ear pain, nosebleeds, postnasal drip, rhinorrhea, sinus pressure, sneezing, sore throat and trouble swallowing.   Eyes: Negative for redness and itching.  Respiratory: Positive for cough, chest tightness and shortness of breath. Negative for wheezing.   Cardiovascular: Negative for palpitations and leg swelling.  Gastrointestinal: Negative for nausea and vomiting.  Genitourinary: Negative for dysuria.  Musculoskeletal: Negative for joint swelling.  Skin: Negative for rash.  Neurological: Negative for headaches.  Hematological: Does not bruise/bleed easily.  Psychiatric/Behavioral: Negative for dysphoric mood. The patient is not nervous/anxious.     Current outpatient prescriptions:  .  ALPRAZolam (XANAX) 1 MG tablet, Take 1 and 1/2 tablet by  mouth up to twice daily as needed, Disp: 60 tablet, Rfl: 5 .  oxycodone (ROXICODONE) 30 MG immediate release tablet, Take 1 tablet (30 mg total) by mouth every 4 (four) hours as needed for pain (Must last 28 days)., Disp: 180 tablet, Rfl: 0 .  predniSONE (DELTASONE) 10 MG tablet, Take 10 mg by mouth daily with breakfast., Disp: , Rfl:  .  rosuvastatin (CRESTOR) 20 MG tablet, Take 20 mg by mouth at bedtime. , Disp: , Rfl:  .  albuterol (PROVENTIL) (2.5 MG/3ML) 0.083% nebulizer solution, Take 2.5 mg by nebulization every 4 (four) hours as needed for wheezing or shortness of breath., Disp: , Rfl:  .  Fluticasone-Salmeterol (ADVAIR) 250-50 MCG/DOSE  AEPB, Inhale 1 puff into the lungs every 12 (twelve) hours. (Patient not taking: Reported on 03/13/2014), Disp: 2 each, Rfl: 0 .  tiotropium (SPIRIVA) 18 MCG inhalation capsule, Place 1 capsule (18 mcg total) into inhaler and inhale daily. (Patient not taking: Reported on 03/13/2014), Disp: 20 capsule, Rfl: 0      Objective:   Physical Exam  Constitutional: He is oriented to person, place, and time. He appears well-developed and well-nourished. No distress.  Body mass index is 17.38 kg/(m^2).   HENT:  Head: Normocephalic and atraumatic.  Right Ear: External ear normal.  Left Ear: External ear normal.  Mouth/Throat: Oropharynx is clear and moist. No oropharyngeal exudate.  Eyes: Conjunctivae and EOM are normal. Pupils are equal, round, and reactive to light. Right eye exhibits no discharge. Left eye exhibits no discharge. No scleral icterus.  Neck: Normal range of motion. Neck supple. No JVD present. No tracheal deviation present. No thyromegaly present.  Cardiovascular: Normal rate, regular rhythm and intact distal pulses.  Exam reveals no gallop and no friction rub.   No murmur heard. Pulmonary/Chest: Effort normal and breath sounds normal. No respiratory distress. He has no wheezes. He has no rales. He exhibits no tenderness.  Scattered wheeze  Abdominal: Soft. Bowel sounds are normal. He exhibits no distension and no mass. There is no tenderness. There is no rebound and no guarding.  Musculoskeletal: Normal range of motion. He exhibits no edema or tenderness.  Sitting with walker Right leg straight  Lymphadenopathy:    He has no cervical adenopathy.  Neurological: He is alert and oriented to person, place, and time. He has normal reflexes. No cranial nerve deficit. Coordination normal.  Skin: Skin is warm and dry. No rash noted. He is not diaphoretic. No erythema. No pallor.  Psychiatric:  Poor rambling historian  Nursing note and vitals reviewed.   Filed Vitals:   03/13/14 1557    BP: 150/74  Pulse: 70  Height: 5\' 7"  (1.702 m)  Weight: 111 lb (50.349 kg)  SpO2: 97%   Estimated body mass index is 17.38 kg/(m^2) as calculated from the following:   Height as of this encounter: 5\' 7"  (1.702 m).   Weight as of this encounter: 111 lb (50.349 kg).        Assessment & Plan:     ICD-9-CM ICD-10-CM   1. COPD exacerbation 491.21 J44.1   2. Chronic obstructive pulmonary disease, unspecified COPD, unspecified chronic bronchitis type 496 J44.9 Ambulatory referral to Hospice  3. Social isolation V62.4 Z60.4    He is in the midst of a COPD exacerbation due to running out of his inhalers due to lack of money and possibly a virus. We will treat him for this by reinstituting his Spiriva and Advair and also with prednisone and antibiotics. However  at baseline he has significant COPD severe disease with significant high symptom burden on the cat score along with severe chronic pain and social isolation. He has impending sense of doom. He also has significant cachexia and malnutrition. Overall I think his prognosis is in the order of months  Discussed medicare hospice benefit  - a medicare paid benefit  - for people with terminal qualifying  illness such as IPF, COPD, cancer with statistical prognosis < 6 months for which there is no cure - utilization of hospice shows people live longer paradoxically than those without hospice due to improved attention  - explained hospice locations - home, residential etc.,   - explained respite care options for caregivers  - explained that she could still get treatment for non-hospice diagnosis and still come to office to see me for hospice related diagnosis that i provide support for  - explained that hospice provides nursing, MD, chaplain, volunteer and medications and supplies paid through medicare   Based on this he is willing to undergo a hospice evaluation for eligibility   ROV 6  Months Sooner if needed  Dr. Brand Males,  M.D., [facility].C.P Pulmonary and Critical Care Medicine Staff Physician [facility] Pager: [phone], If no answer or between  15:00h - 7:00h: call [phone]  03/13/2014 4:19 PM

## 2014-03-13 NOTE — Addendum Note (Signed)
Addended by: Maurice March on: 03/13/2014 04:25 PM   Modules accepted: Orders

## 2014-03-13 NOTE — Patient Instructions (Addendum)
ICD-9-CM ICD-10-CM   1. COPD exacerbation 491.21 J44.1   2. Chronic obstructive pulmonary disease, unspecified COPD, unspecified chronic bronchitis type 496 J44.9 Ambulatory referral to Hospice  3. Social isolation V62.4 Z60.4     #COPD  - severe copd; in exacerbation - If you need to optimize symptoms more it would mean more expensive inhalers  - So for now,   - contnue advair  - continue spiriva  1 puff daily ;   - use nebulizer only if needed - cOntinue prednisone given by pcp Simona Huh, MD - Take doxycycline 100mg  po twice daily x 5 days; take after meals and avoid sunlight - refer home hospice    #Followup  6 months or sooner if needed Call or come sooner if any health problems

## 2014-03-14 ENCOUNTER — Telehealth: Payer: Self-pay | Admitting: Internal Medicine

## 2014-03-14 NOTE — Telephone Encounter (Signed)
Per MR, will be hospice attending and would like hospice MD's to assist with symptom management.  Stacy @ hospice notified. Nothing further needed.

## 2014-03-20 ENCOUNTER — Telehealth: Payer: Self-pay | Admitting: Internal Medicine

## 2014-03-20 DIAGNOSIS — R05 Cough: Secondary | ICD-10-CM

## 2014-03-20 DIAGNOSIS — R059 Cough, unspecified: Secondary | ICD-10-CM

## 2014-03-20 NOTE — Telephone Encounter (Signed)
Spoke with [facility] with Hospice  She states with the pt currently Pt states that his cough has not improved at all since last ov here 1/28  He has had fever and chills  I have scheduled him for ov with TP for tomorrow at 3 pm with cxr arrive at 2 pm  Pt urged to seek emergent care in the meantime if needed   Nurse wanted to let MR know that due the fact the pt is not o2 dept at this time he does not qualify for hospice  Will forward to him so he is aware

## 2014-03-21 ENCOUNTER — Encounter (HOSPITAL_COMMUNITY): Payer: Self-pay | Admitting: Emergency Medicine

## 2014-03-21 ENCOUNTER — Ambulatory Visit (INDEPENDENT_AMBULATORY_CARE_PROVIDER_SITE_OTHER)
Admission: RE | Admit: 2014-03-21 | Discharge: 2014-03-21 | Disposition: A | Discharge status: Home / Self Care | Payer: Medicare Other | Source: Ambulatory Visit | Attending: Adult Health | Admitting: Adult Health

## 2014-03-21 ENCOUNTER — Other Ambulatory Visit (INDEPENDENT_AMBULATORY_CARE_PROVIDER_SITE_OTHER): Payer: Medicare Other

## 2014-03-21 ENCOUNTER — Emergency Department (HOSPITAL_COMMUNITY)
Admission: EM | Admit: 2014-03-21 | Discharge: 2014-03-21 | Disposition: A | Discharge status: Home / Self Care | Payer: Medicare Other | Attending: Emergency Medicine | Admitting: Emergency Medicine

## 2014-03-21 ENCOUNTER — Encounter: Payer: Self-pay | Admitting: Adult Health

## 2014-03-21 ENCOUNTER — Ambulatory Visit (INDEPENDENT_AMBULATORY_CARE_PROVIDER_SITE_OTHER): Payer: Medicare Other | Admitting: Adult Health

## 2014-03-21 VITALS — BP 138/64 | HR 88 | Temp 97.3°F | Ht 68.0 in | Wt 108.6 lb

## 2014-03-21 DIAGNOSIS — F329 Major depressive disorder, single episode, unspecified: Secondary | ICD-10-CM | POA: Insufficient documentation

## 2014-03-21 DIAGNOSIS — J449 Chronic obstructive pulmonary disease, unspecified: Secondary | ICD-10-CM | POA: Insufficient documentation

## 2014-03-21 DIAGNOSIS — Z8546 Personal history of malignant neoplasm of prostate: Secondary | ICD-10-CM | POA: Insufficient documentation

## 2014-03-21 DIAGNOSIS — G8929 Other chronic pain: Secondary | ICD-10-CM | POA: Insufficient documentation

## 2014-03-21 DIAGNOSIS — E875 Hyperkalemia: Secondary | ICD-10-CM | POA: Insufficient documentation

## 2014-03-21 DIAGNOSIS — R05 Cough: Secondary | ICD-10-CM

## 2014-03-21 DIAGNOSIS — Z7952 Long term (current) use of systemic steroids: Secondary | ICD-10-CM | POA: Insufficient documentation

## 2014-03-21 DIAGNOSIS — F419 Anxiety disorder, unspecified: Secondary | ICD-10-CM | POA: Insufficient documentation

## 2014-03-21 DIAGNOSIS — Z720 Tobacco use: Secondary | ICD-10-CM | POA: Insufficient documentation

## 2014-03-21 DIAGNOSIS — E785 Hyperlipidemia, unspecified: Secondary | ICD-10-CM | POA: Insufficient documentation

## 2014-03-21 DIAGNOSIS — Z880 Allergy status to penicillin: Secondary | ICD-10-CM | POA: Insufficient documentation

## 2014-03-21 DIAGNOSIS — Z872 Personal history of diseases of the skin and subcutaneous tissue: Secondary | ICD-10-CM | POA: Diagnosis not present

## 2014-03-21 DIAGNOSIS — Z87438 Personal history of other diseases of male genital organs: Secondary | ICD-10-CM | POA: Insufficient documentation

## 2014-03-21 DIAGNOSIS — Z853 Personal history of malignant neoplasm of breast: Secondary | ICD-10-CM | POA: Diagnosis not present

## 2014-03-21 DIAGNOSIS — Z7951 Long term (current) use of inhaled steroids: Secondary | ICD-10-CM | POA: Insufficient documentation

## 2014-03-21 DIAGNOSIS — Z8639 Personal history of other endocrine, nutritional and metabolic disease: Secondary | ICD-10-CM

## 2014-03-21 DIAGNOSIS — Z86010 Personal history of colonic polyps: Secondary | ICD-10-CM | POA: Insufficient documentation

## 2014-03-21 DIAGNOSIS — Z79899 Other long term (current) drug therapy: Secondary | ICD-10-CM | POA: Insufficient documentation

## 2014-03-21 DIAGNOSIS — R911 Solitary pulmonary nodule: Secondary | ICD-10-CM

## 2014-03-21 DIAGNOSIS — I10 Essential (primary) hypertension: Secondary | ICD-10-CM | POA: Diagnosis not present

## 2014-03-21 DIAGNOSIS — Z8719 Personal history of other diseases of the digestive system: Secondary | ICD-10-CM | POA: Diagnosis not present

## 2014-03-21 DIAGNOSIS — R059 Cough, unspecified: Secondary | ICD-10-CM

## 2014-03-21 DIAGNOSIS — Z8739 Personal history of other diseases of the musculoskeletal system and connective tissue: Secondary | ICD-10-CM | POA: Diagnosis not present

## 2014-03-21 DIAGNOSIS — R918 Other nonspecific abnormal finding of lung field: Secondary | ICD-10-CM | POA: Insufficient documentation

## 2014-03-21 DIAGNOSIS — R7989 Other specified abnormal findings of blood chemistry: Secondary | ICD-10-CM | POA: Diagnosis present

## 2014-03-21 LAB — CBC WITH DIFFERENTIAL/PLATELET
Basophils Absolute: 0 10*3/uL (ref 0.0–0.1)
Basophils Relative: 0 % (ref 0–1)
Eosinophils Absolute: 0 10*3/uL (ref 0.0–0.7)
Eosinophils Relative: 0 % (ref 0–5)
HCT: 41.2 % (ref 39.0–52.0)
HEMOGLOBIN: 13.8 g/dL (ref 13.0–17.0)
LYMPHS ABS: 1.5 10*3/uL (ref 0.7–4.0)
LYMPHS PCT: 17 % (ref 12–46)
MCH: 32.1 pg (ref 26.0–34.0)
MCHC: 33.5 g/dL (ref 30.0–36.0)
MCV: 95.8 fL (ref 78.0–100.0)
MONO ABS: 0.6 10*3/uL (ref 0.1–1.0)
Monocytes Relative: 7 % (ref 3–12)
Neutro Abs: 6.6 10*3/uL (ref 1.7–7.7)
Neutrophils Relative %: 76 % (ref 43–77)
Platelets: 124 10*3/uL — ABNORMAL LOW (ref 150–400)
RBC: 4.3 MIL/uL (ref 4.22–5.81)
RDW: 16.3 % — ABNORMAL HIGH (ref 11.5–15.5)
WBC: 8.7 10*3/uL (ref 4.0–10.5)

## 2014-03-21 LAB — BASIC METABOLIC PANEL
ANION GAP: 8 (ref 5–15)
BUN: 24 mg/dL — ABNORMAL HIGH (ref 6–23)
BUN: 22 mg/dL (ref 6–23)
CHLORIDE: 102 meq/L (ref 96–112)
CO2: 34 mEq/L — ABNORMAL HIGH (ref 19–32)
CO2: 30 mmol/L (ref 19–32)
Calcium: 10.1 mg/dL (ref 8.4–10.5)
Calcium: 9.2 mg/dL (ref 8.4–10.5)
Chloride: 101 mmol/L (ref 96–112)
Creatinine, Ser: 1.17 mg/dL (ref 0.40–1.50)
Creatinine, Ser: 1.04 mg/dL (ref 0.50–1.35)
GFR calc non Af Amer: 67 mL/min — ABNORMAL LOW (ref >90)
GFR, EST AFRICAN AMERICAN: 78 mL/min — AB (ref >90)
GFR: 64.11 mL/min (ref >60.00)
GLUCOSE: 110 mg/dL — AB (ref 70–99)
Glucose, Bld: 105 mg/dL — ABNORMAL HIGH (ref 70–99)
POTASSIUM: 4 mmol/L (ref 3.5–5.1)
Potassium: 6.4 mEq/L (ref 3.5–5.1)
Sodium: 140 mEq/L (ref 135–145)
Sodium: 139 mmol/L (ref 135–145)

## 2014-03-21 LAB — CBG MONITORING, ED: Glucose-Capillary: 104 mg/dL — ABNORMAL HIGH (ref 70–99)

## 2014-03-21 LAB — I-STAT TROPONIN, ED: Troponin i, poc: 0 ng/mL (ref 0.00–0.08)

## 2014-03-21 MED ORDER — LEVOFLOXACIN 500 MG PO TABS: 500.0000 mg | ORAL_TABLET | Freq: Every day | ORAL | Status: AC

## 2014-03-21 MED ORDER — MORPHINE SULFATE 4 MG/ML IJ SOLN
4.0000 mg | Freq: Once | INTRAMUSCULAR | Status: AC
  Administered 2014-03-21: 4 mg via INTRAVENOUS
  Filled 2014-03-21: qty 1

## 2014-03-21 MED ORDER — PREDNISONE 10 MG PO TABS: ORAL_TABLET | ORAL | Status: DC

## 2014-03-21 MED ORDER — ONDANSETRON HCL 4 MG/2ML IJ SOLN
4.0000 mg | Freq: Once | INTRAMUSCULAR | Status: AC
  Administered 2014-03-21: 4 mg via INTRAVENOUS
  Filled 2014-03-21: qty 2

## 2014-03-21 NOTE — Progress Notes (Signed)
Subjective:    Patient ID: [name], male    DOB: 1934/12/26, 80 y.o.   MRN: [id number]  HPI  #COPD  - pirometry 06/25/12"  fev1 Pos BD fev1 1.17L/42% (+5% bd response), FVC 3.4L/88%, R 34, DLCO 36% and is consistent with Gold stage III COPD.  #CAchexia/ sever malnutrition  - Body mass index is 17.38 kg/(m^2). 03/13/2014   #Chronic pain   OV 03/13/2014  Chief Complaint  Patient presents with  . Follow-up    Pt c/o increase in SOB, prod cough with clear mucus, chest congestion and chest tightness x 1 month. pt stated his s/s are worsening.     80 year old male with severe COPD, cachexia/severe malnutrition, chronic pain and significant debility. Presents for follow-up last seen in our office this summer 2014 for COPD exacerbation. I do not know the details of his absence for one year. He says that in the last month or so he's had increased cough, sputum production, change in color of sputum to green, chest tightness that is worse, increased wheezing. He feels and is in a chronic state of exacerbation. He was started by his primary care physician on prednisone and is in the midst of a taper of this is not fully helping.also find out that he is not using his Spiriva and Advair because of financial constraints.  He is also dealing with chronic pain, loneliness and severe COPD and cachexia that is limiting his mobility and independence and mood. He is describing for the first time a strong sense that his prognosis is limited and that he will die soon from a COPD   CAT COPD Symptom & Quality of Life Score ([place]) 0 is no burden. 5 is highest burden 02/27/2012  06/25/2012  09/13/2012  03/13/2014   Never Cough -> Cough all the time 2 1 2    No phlegm in chest -> Chest is full of phlegm 3 3 3    No chest tightness -> Chest feels very tight 4 3 2    No dyspnea for 1 flight stairs/hill -> Very dyspneic for 1 flight of stairs 5 5 4    No limitations for ADL at home -> Very limited with  ADL at home 5 5 5    Confident leaving home -> Not at all confident leaving home 5 3 4    Sleep soundly -> Do not sleep soundly because of lung condition 4 4 4    Lots of Energy -> No energy at all 4 4 3    TOTAL Score (max 40)  32 28 27   BMI  Body mass index is 17.64 kg/(m^2).  Body mass index is 18.22 kg/(m^2).  Body mass index is 17.38 kg/(m^2).     03/21/2014 Acute OV  Patient complains of increased cough, congestion, shortness of breath and weakness. Chest x-ray showed a nodular lesion in the left upper lobe measuring 3.5 x 3.3 cm. We discussed these results and the need to follow-up with a CT chest. Last visit. Patient was recommended for hospice referral. He had a hospice evaluation and told that he was not considered hospice eligible. Very upset he did not qualify for hospice .  He was also told that his life expectancy was written down for 6 months and last and he was very upset about this. We had a long discussion about his and prognosis. Patient complains that he is coughing up very thick, yellow-green mucus. He denies any hemoptysis, orthopnea, PND or leg swelling. Weight has been slowly trending down over  the last year. Patient complains over the last year. He's been getting weaker with no energy and gets winded with minimal activity.    Review of Systems  Constitutional:   No  weight loss, night sweats,  Fevers, chills,  +fatigue, or  lassitude.  HEENT:   No headaches,  Difficulty swallowing,  Tooth/dental problems, or  Sore throat,                No sneezing, itching, ear ache,  +nasal congestion, post nasal drip,   CV:  No chest pain,  Orthopnea, PND, swelling in lower extremities, anasarca, dizziness, palpitations, syncope.   GI  No heartburn, indigestion, abdominal pain, nausea, vomiting, diarrhea, change in bowel habits, loss of appetite, bloody stools.   Resp:  .  No chest wall deformity  Skin: no rash or lesions.  GU: no dysuria, change in color of urine, no  urgency or frequency.  No flank pain, no hematuria   MS:  No joint pain or swelling.  No decreased range of motion.  No back pain.  Psych:  No change in mood or affect. No depression or anxiety.  No memory loss.     '       Objective:   Physical Exam  GEN: A/Ox3; pleasant , NAD, frail and elderly , cachexic   HEENT:  [place]/AT,  EACs-clear, TMs-wnl, NOSE-clear, THROAT-clear, no lesions, no postnasal drip or exudate noted.   NECK:  Supple w/ fair ROM; no JVD; normal carotid impulses w/o bruits; no thyromegaly or nodules palpated; no lymphadenopathy.  RESP  Decreased BS in bases no accessory muscle use, no dullness to percussion  CARD:  RRR, no m/r/g  , no peripheral edema, pulses intact, no cyanosis or clubbing.  GI:   Soft & nt; nml bowel sounds; no organomegaly or masses detected.  Musco: Warm bil, no deformities or joint swelling noted.   Neuro: alert, no focal deficits noted.    Skin: Warm, no lesions or rashes           Assessment & Plan:

## 2014-03-21 NOTE — ED Notes (Signed)
Pt c/o weakness, fatigue went to PCP, states his blood was drawn today and potassium was 6.4.

## 2014-03-21 NOTE — ED Notes (Signed)
Discharge instructions reviewed with patient--agrees and verbalized understanding Patient informed of need to make and keep follow up appointment--agrees and verbalized understanding VS updated and stable--reviewed with patient at time of DC--agrees and verbalized understanding Patient alert and oriented x 4 and in NAD at time of discharge All questions related to this ED visit, DC instructions and follow up care answered to patient's satisfaction by this nurse

## 2014-03-21 NOTE — Progress Notes (Signed)
Quick Note:  Called spoke with patient, advised of lab results/recs as stated by TP. Pt voiced his understanding. He will try to arrange transportation and go to [facility] ER as soon as possible. ______

## 2014-03-21 NOTE — ED Notes (Signed)
Patient sent from PCP office for elevated potassium level Patient with hx of COPD and chronic CP, SOB due to same Patient also with c/o chronic bilateral foot pain due to neuropathy Patient alert and oriented x 4 Patient able to speak in full complete sentences without difficulty--No SOB/DOE EDP at bedside

## 2014-03-21 NOTE — Patient Instructions (Addendum)
Levaquin 500mg   daily for 7 days Mucinex DM twice daily as needed for cough and congestion Prednisone taper. The next week Fluids and rest Set up CT chest for lung nodule  follow up Dr. Chase Caller in 4 weeks and As needed   Please contact office for sooner follow up if symptoms do not improve or worsen or seek emergency care     Late add Call report from lab reported a potassium at 6.4 Patient was called with these results and complains that he is very weak. Patient was advised to go to the emergency room for further evaluation and treatment options

## 2014-03-21 NOTE — ED Provider Notes (Signed)
CSN: [id number]     Arrival date & time 03/21/14  1904 History   First MD Initiated Contact with Patient 03/21/14 1920     Chief Complaint  Patient presents with  . Potassium 6.4      (Consider location/radiation/quality/duration/timing/severity/associated sxs/prior Treatment) HPI Comments: Patient is a 80 year old male with a past medical history of hypertension, PVD, COPD, GERD and h/o prostate and breast cancer who presents with elevated potassium at 6.4. Patient reports seeing his PCP earlier today where he had routine labs drawn which showed elevated potassium. Patient reports having "pain all over" that is aching and severe. He denies any other symptoms. No aggravating/alleviating factors. Patient denies history of kidney disease.    Past Medical History  Diagnosis Date  . History of colon polyps   . Spinal stenosis   . Anxiety   . BPH (benign prostatic hypertrophy)   . ED (erectile dysfunction)   . Osteoporosis   . Hypertension   . Chest pain     "I've had it" (01/31/2012)   . Peripheral vascular disease     "right leg is 100% blocked; left leg has stent, still ~ 75% blocked" (01/31/2012)  . Peripheral neuropathy     "bad" (01/31/2012)  . COPD (chronic obstructive pulmonary disease)   . Emphysema   . BRBPR (bright red blood per rectum)     "don't know what it's from; had some this week" (01/31/2012)  . Chronic lower back pain   . Pernicious anemia   . GERD (gastroesophageal reflux disease)   . Polyneuropathy in other diseases classified elsewhere 11/06/2012  . Prostate cancer 2004    "had 40 treatments of radiation" (01/31/2012)  . Breast cancer in male     "left" (01/31/2012)  . Basal cell carcinoma of nasal tip   . Pneumonia 1960's    "double"   . Cervical spondylosis   . Rheumatoid arthritis(714.0)   . Arthritis     "all over my body" (04/25/2013)  . DDD (degenerative disc disease), lumbosacral     "S1; L5" (01/31/2012)  . Lumbosacral spondylosis without  myelopathy 11/06/2012  . Cellulitis of right leg   . Depression     "won't take RX though" (04/25/2013)  . Falls frequently   . Severe malnutrition 11/27/2013  . Hyperlipidemia    Past Surgical History  Procedure Laterality Date  . Basal cell carcinoma excision      Nose x 3  . Cholecystectomy  ?1990's  . Wrist fusion Right     "3 OR's; it's fused" (01/31/2012)  . Excisional hemorrhoidectomy  1960's  . Knee surgery Right      6 surgeries; went in for simple cartilage OR; ended up w/fused knee" (01/31/2012)  . Iliac artery stent  12-07-09    Stent done by Dr. Gwenlyn Found  . Total hip arthroplasty  08/16/2011    Procedure: TOTAL HIP ARTHROPLASTY ANTERIOR APPROACH;  Surgeon: Mcarthur Rossetti, MD;  Location: [place];  Service: Orthopedics;  Laterality: Right;  Right total hip replacement  . Cervical disc surgery      "7 total; ended w/a fusion" (01/31/2012)  . Esophagogastroduodenoscopy  02/22/2012    Procedure: ESOPHAGOGASTRODUODENOSCOPY (EGD);  Surgeon: Arta Silence, MD;  Location: [facility];  Service: Endoscopy;  Laterality: N/A;  . Eus  02/22/2012    Procedure: UPPER ENDOSCOPIC ULTRASOUND (EUS) RADIAL;  Surgeon: Arta Silence, MD;  Location: [facility];  Service: Endoscopy;  Laterality: N/A;  . Hernia repair    . Transurethral resection  of prostate    . Aortogram  04/25/2013  . Esophagogastroduodenoscopy Left 04/27/2013    Procedure: ESOPHAGOGASTRODUODENOSCOPY (EGD);  Surgeon: Arta Silence, MD;  Location: [facility] ENDOSCOPY;  Service: Endoscopy;  Laterality: Left;  . Femoral-femoral bypass graft Bilateral 05/01/2013    Procedure: BYPASS GRAFT FEMORAL-FEMORAL ARTERY/ LEFT - RIGHT;  Surgeon: Rosetta Posner, MD;  Location: [place];  Service: Vascular;  Laterality: Bilateral;  . Abdominal aortagram N/A 04/25/2013    Procedure: ABDOMINAL Maxcine Ham;  Surgeon: Conrad Lueders, MD;  Location: [facility] CATH LAB;  Service: Cardiovascular;  Laterality: N/A;   Family History  Problem Relation Age of Onset  .  Hypertension Mother   . Cancer Mother     colon cancer  . Stroke Mother   . Aneurysm Father     abdominal aortic  . Heart disease Brother    History  Substance Use Topics  . Smoking status: Current Every Day Smoker -- 0.50 packs/day for 63 years    Types: Cigarettes    Last Attempt to Quit: 04/30/2013  . Smokeless tobacco: Never Used  . Alcohol Use: Yes     Comment: 04/25/2013 "used to be a heavy drinker; stopped drinking in 1975"    Review of Systems  Constitutional: Negative for fever, chills and fatigue.  HENT: Negative for trouble swallowing.   Eyes: Negative for visual disturbance.  Respiratory: Negative for shortness of breath.   Cardiovascular: Negative for chest pain and palpitations.  Gastrointestinal: Negative for nausea, vomiting, abdominal pain and diarrhea.  Genitourinary: Negative for dysuria and difficulty urinating.  Musculoskeletal: Negative for arthralgias and neck pain.  Skin: Negative for color change.  Neurological: Negative for dizziness and weakness.  Psychiatric/Behavioral: Negative for dysphoric mood.      Allergies  Lipitor and Penicillins  Home Medications   Prior to Admission medications   Medication Sig Start Date End Date Taking? Authorizing Provider  albuterol (PROVENTIL) (2.5 MG/3ML) 0.083% nebulizer solution Take 2.5 mg by nebulization every 4 (four) hours as needed for wheezing or shortness of breath.   Yes Historical Provider, MD  ALPRAZolam Duanne Moron) 1 MG tablet Take 1 and 1/2 tablet by mouth up to twice daily as needed Patient taking differently: Take 1.5 mg by mouth 2 (two) times daily as needed for anxiety.  12/30/13  Yes Hennie Duos, MD  Fluticasone-Salmeterol (ADVAIR) 250-50 MCG/DOSE AEPB Inhale 1 puff into the lungs every 12 (twelve) hours. 02/22/13  Yes Tanda Rockers, MD  oxycodone (ROXICODONE) 30 MG immediate release tablet Take 1 tablet (30 mg total) by mouth every 4 (four) hours as needed for pain (Must last 28 days). 03/03/14   Yes Kathrynn Ducking, MD  predniSONE (DELTASONE) 10 MG tablet Take 10 mg by mouth daily with breakfast.   Yes Historical Provider, MD  rosuvastatin (CRESTOR) 20 MG tablet Take 20 mg by mouth at bedtime.    Yes Historical Provider, MD  tiotropium (SPIRIVA) 18 MCG inhalation capsule Place 1 capsule (18 mcg total) into inhaler and inhale daily. 02/22/13  Yes Tanda Rockers, MD  levofloxacin (LEVAQUIN) 500 MG tablet Take 1 tablet (500 mg total) by mouth daily. Patient not taking: Reported on 03/21/2014 03/21/14 03/31/14  Fonnie Mu Parrett, NP  predniSONE (DELTASONE) 10 MG tablet 4 tabs for 2 days, then 3 tabs for 2 days, 2 tabs for 2 days, then 1 tab for 2 days, then stop Patient not taking: Reported on 03/21/2014 03/21/14   Fonnie Mu Parrett, NP  sertraline (ZOLOFT) 50 MG tablet Take  1.5 tablets (75 mg total) by mouth at bedtime. Patient not taking: Reported on 03/21/2014 03/13/14   Brand Males, MD   BP 147/60 mmHg  Pulse 70  Temp(Src) 98.1 F (36.7 C) (Oral)  Resp 14  SpO2 97% Physical Exam  Constitutional: He is oriented to person, place, and time. He appears well-developed and well-nourished. No distress.  HENT:  Head: Normocephalic and atraumatic.  Eyes: Conjunctivae and EOM are normal.  Neck: Normal range of motion.  Cardiovascular: Normal rate and regular rhythm.  Exam reveals no gallop and no friction rub.   No murmur heard. Pulmonary/Chest: Effort normal and breath sounds normal. He has no wheezes. He has no rales. He exhibits no tenderness.  Abdominal: Soft. He exhibits no distension. There is no tenderness. There is no rebound.  Musculoskeletal: Normal range of motion.  Neurological: He is alert and oriented to person, place, and time. Coordination normal.  Speech is goal-oriented. Moves limbs without ataxia.   Skin: Skin is warm and dry.  Psychiatric: He has a normal mood and affect. His behavior is normal.  Nursing note and vitals reviewed.   ED Course  Procedures (including critical  care time) Labs Review Labs Reviewed  CBC WITH DIFFERENTIAL/PLATELET - Abnormal; Notable for the following:    RDW 16.3 (*)    Platelets 124 (*)    All other components within normal limits  BASIC METABOLIC PANEL - Abnormal; Notable for the following:    Glucose, Bld 105 (*)    GFR calc non Af Amer 67 (*)    GFR calc Af Amer 78 (*)    All other components within normal limits  CBG MONITORING, ED - Abnormal; Notable for the following:    Glucose-Capillary 104 (*)    All other components within normal limits  I-STAT CHEM 8, ED  I-STAT TROPOININ, ED    Imaging Review Dg Chest 2 View  03/21/2014   CLINICAL DATA:  Cough, chills, shortness of Breath  EXAM: CHEST  2 VIEW  COMPARISON:  04/24/2013  FINDINGS: Cardiomediastinal silhouette is stable. Hyperinflation again noted. No segmental infiltrate. There is nodular lesion in left upper lobe measures 3.3 x 3.5 cm. Further correlation with CT scan of the chest is recommended. Osteopenia and mild degenerative changes thoracic spine. Chronic blunting of the left costophrenic angle.  IMPRESSION: Hyperinflation. Nodular lesion in left upper lobe measures 3.5 x 3.3 cm. Further correlation with CT scan of the chest is recommended. Chronic blunting of left costophrenic angle.  These results were called by telephone at the time of interpretation on 03/21/2014 at 2:46 pm to Dr. Rexene Edison , who verbally acknowledged these results.   Electronically Signed   By: Lahoma Crocker M.D.   On: 03/21/2014 14:46    Date: 03/21/2014  Rate: 88  Rhythm: normal sinus rhythm  QRS Axis: left  Intervals: QT prolonged  ST/T Wave abnormalities: indeterminate  Conduction Disutrbances:right bundle branch block and left anterior fascicular block  Narrative Interpretation: NSR without acute changes  Old EKG Reviewed: unchanged    EKG Interpretation None      MDM   Final diagnoses:  H/O hyperkalemia    9:52 PM Labs unremarkable for acute changes. Patient will be  discharged with instructions to follow up with his PCP. Vitals stable and patient afebrile.    Alvina Chou, PA-C 03/21/14 2158  Richarda Blade, MD 03/22/14 [phone]

## 2014-03-21 NOTE — Assessment & Plan Note (Signed)
Exacerbation  Plan Levaquin 500mg   daily for 7 days Mucinex DM twice daily as needed for cough and congestion Prednisone taper. The next week Fluids and rest  follow up Dr. Chase Caller in 4 weeks and As needed   Please contact office for sooner follow up if symptoms do not improve or worsen or seek emergency care

## 2014-03-21 NOTE — ED Notes (Signed)
Bed: TV47 Expected date:  Expected time:  Means of arrival:  Comments: Triage 3

## 2014-03-21 NOTE — Assessment & Plan Note (Signed)
Irregular lung mass measuring 3.5 cm in the left upper lobe concerning for malignancy Patient has been set up for a CT chest

## 2014-03-24 ENCOUNTER — Telehealth: Payer: Self-pay | Admitting: Internal Medicine

## 2014-03-24 MED ORDER — TIOTROPIUM BROMIDE MONOHYDRATE 18 MCG IN CAPS: 18.0000 ug | ORAL_CAPSULE | Freq: Every day | RESPIRATORY_TRACT | Status: DC

## 2014-03-24 NOTE — Telephone Encounter (Signed)
I called spoke with pt. He reports he went to ED 03/21/14 and all labs were back to normal. He reports he was seen here same day and labs were abnormal. Pt was not so happy he had to go to ED and everything was normal. He understands we were looking out for him and his health. I advised I will let TP know his labs were back to normal.

## 2014-03-24 NOTE — Addendum Note (Signed)
Addended by: Parke Poisson E on: 03/24/2014 09:05 AM   Modules accepted: Orders

## 2014-03-28 ENCOUNTER — Ambulatory Visit (INDEPENDENT_AMBULATORY_CARE_PROVIDER_SITE_OTHER)
Admission: RE | Admit: 2014-03-28 | Discharge: 2014-03-28 | Disposition: A | Discharge status: Home / Self Care | Payer: Medicare Other | Source: Ambulatory Visit | Attending: Adult Health | Admitting: Adult Health

## 2014-03-28 DIAGNOSIS — R911 Solitary pulmonary nodule: Secondary | ICD-10-CM

## 2014-03-28 MED ORDER — IOHEXOL 300 MG/ML  SOLN
80.0000 mL | Freq: Once | INTRAMUSCULAR | Status: AC | PRN
  Administered 2014-03-28: 80 mL via INTRAVENOUS

## 2014-03-30 ENCOUNTER — Telehealth: Payer: Self-pay | Admitting: Emergency Medicine

## 2014-03-30 NOTE — Telephone Encounter (Signed)
Called report from pt's CT chest done on 03/28/14. Unfortunately confirms a 3.7x1.9cm LUL mass with some mediastinal LAD. Also saw some bilary ductal dilation suspicious for distal bile duct or pancreatic head abnormality - recommend a CT abdomen to evaluate further. I will forward these results to Dr Chase Caller and to Rexene Edison NP

## 2014-03-31 ENCOUNTER — Other Ambulatory Visit: Payer: Self-pay | Admitting: *Deleted

## 2014-03-31 ENCOUNTER — Telehealth: Payer: Self-pay | Admitting: Adult Health

## 2014-03-31 DIAGNOSIS — R918 Other nonspecific abnormal finding of lung field: Secondary | ICD-10-CM

## 2014-03-31 NOTE — Telephone Encounter (Signed)
[name]  Please set him up for PET scan and then fu  (cc to Tammy P)   Thanks  Dr. Brand Males, M.D., [facility].C.P Pulmonary and Critical Care Medicine Staff Physician [facility] Pager: [phone], If no answer or between  15:00h - 7:00h: call [phone]  03/31/2014 7:05 AM

## 2014-03-31 NOTE — Telephone Encounter (Signed)
Notes Recorded by Melvenia Needles, NP on 03/31/2014 at 11:58 AM Spoke with pt , advised on results  Needs a PET scan ASAP with ov 2 days later to discuss results.  Please put on reminder list  Place on mine or Dr. Chase Caller schedule.  Pt wants pm appointment  Please advise of importance to keep PET ov and follow up       Spoke with pt and reviewed the above.  Scheduled pt with TP on 2/26 at 3:45 d/t pt not willing to come to a morning clinic appt earlier in the week.  Nothing further needed.

## 2014-04-01 ENCOUNTER — Telehealth: Payer: Self-pay | Admitting: *Deleted

## 2014-04-01 NOTE — Telephone Encounter (Signed)
Patient calling wanting a refill on Oxycodone. Patient has an appt this Thursday so we will wait and get the Rx when he comes in. Patient is completely out.

## 2014-04-01 NOTE — Telephone Encounter (Signed)
Called and spoke to pt. Pt already has an appt for PET and f/u with TP. Pt aware of time and date of appointments. Nothing further needed.

## 2014-04-02 NOTE — Telephone Encounter (Signed)
Nothing further is needed TP is aware pt's labs were normal at the hospital Thanks

## 2014-04-02 NOTE — Telephone Encounter (Signed)
[name], is anything else needed? Thanks.

## 2014-04-03 ENCOUNTER — Ambulatory Visit (INDEPENDENT_AMBULATORY_CARE_PROVIDER_SITE_OTHER): Payer: Medicare Other | Admitting: Neurology

## 2014-04-03 ENCOUNTER — Encounter: Payer: Self-pay | Admitting: Neurology

## 2014-04-03 VITALS — BP 177/77 | HR 89 | Ht 68.0 in | Wt 111.4 lb

## 2014-04-03 DIAGNOSIS — E43 Unspecified severe protein-calorie malnutrition: Secondary | ICD-10-CM

## 2014-04-03 DIAGNOSIS — M47817 Spondylosis without myelopathy or radiculopathy, lumbosacral region: Secondary | ICD-10-CM

## 2014-04-03 DIAGNOSIS — G63 Polyneuropathy in diseases classified elsewhere: Secondary | ICD-10-CM

## 2014-04-03 DIAGNOSIS — E41 Nutritional marasmus: Secondary | ICD-10-CM

## 2014-04-03 MED ORDER — OXYCODONE HCL 30 MG PO TABS: 30.0000 mg | ORAL_TABLET | ORAL | Status: DC | PRN

## 2014-04-03 NOTE — Progress Notes (Signed)
Reason for visit: Peripheral neuropathy  [name] is an 80 y.o. male  History of present illness:  [name] is a 80 year old right-handed white male with a history of chronic tobacco abuse, peripheral vascular disease, and a peripheral neuropathy. The patient has chronic neck and low back discomfort. The patient recently was found to have a mass in the left upper lobe consistent with cancer. He is to undergo a PET scan in near future. The patient has gained about 3 pounds of weight since last seen. The patient has had severe weight loss. The patient continues to have severe pain in the legs, he is only able to sleep short periods of time without wakening because of discomfort. He is relatively inactive, unable to get out of the house much secondary to the walking problems and pain. He denies any recent falls, he walks with a walker. He comes to this office for an evaluation.  Past Medical History  Diagnosis Date  . History of colon polyps   . Spinal stenosis   . Anxiety   . BPH (benign prostatic hypertrophy)   . ED (erectile dysfunction)   . Osteoporosis   . Hypertension   . Chest pain     "I've had it" (01/31/2012)   . Peripheral vascular disease     "right leg is 100% blocked; left leg has stent, still ~ 75% blocked" (01/31/2012)  . Peripheral neuropathy     "bad" (01/31/2012)  . COPD (chronic obstructive pulmonary disease)   . Emphysema   . BRBPR (bright red blood per rectum)     "don't know what it's from; had some this week" (01/31/2012)  . Chronic lower back pain   . Pernicious anemia   . GERD (gastroesophageal reflux disease)   . Polyneuropathy in other diseases classified elsewhere 11/06/2012  . Prostate cancer 2004    "had 40 treatments of radiation" (01/31/2012)  . Breast cancer in male     "left" (01/31/2012)  . Basal cell carcinoma of nasal tip   . Pneumonia 1960's    "double"   . Cervical spondylosis   . Rheumatoid arthritis(714.0)   . Arthritis    "all over my body" (04/25/2013)  . DDD (degenerative disc disease), lumbosacral     "S1; L5" (01/31/2012)  . Lumbosacral spondylosis without myelopathy 11/06/2012  . Cellulitis of right leg   . Depression     "won't take RX though" (04/25/2013)  . Falls frequently   . Severe malnutrition 11/27/2013  . Hyperlipidemia     Past Surgical History  Procedure Laterality Date  . Basal cell carcinoma excision      Nose x 3  . Cholecystectomy  ?1990's  . Wrist fusion Right     "3 OR's; it's fused" (01/31/2012)  . Excisional hemorrhoidectomy  1960's  . Knee surgery Right      6 surgeries; went in for simple cartilage OR; ended up w/fused knee" (01/31/2012)  . Iliac artery stent  12-07-09    Stent done by Dr. Gwenlyn Found  . Total hip arthroplasty  08/16/2011    Procedure: TOTAL HIP ARTHROPLASTY ANTERIOR APPROACH;  Surgeon: Mcarthur Rossetti, MD;  Location: [place];  Service: Orthopedics;  Laterality: Right;  Right total hip replacement  . Cervical disc surgery      "7 total; ended w/a fusion" (01/31/2012)  . Esophagogastroduodenoscopy  02/22/2012    Procedure: ESOPHAGOGASTRODUODENOSCOPY (EGD);  Surgeon: Arta Silence, MD;  Location: [facility];  Service: Endoscopy;  Laterality: N/A;  .  Eus  02/22/2012    Procedure: UPPER ENDOSCOPIC ULTRASOUND (EUS) RADIAL;  Surgeon: Arta Silence, MD;  Location: [facility]OS[facility];  Service: Endoscopy;  Laterality: N/A;  . Hernia repair    . Transurethral resection of prostate    . Aortogram  04/25/2013  . Esophagogastroduodenoscopy Left 04/27/2013    Procedure: ESOPHAGOGASTRODUODENOSCOPY (EGD);  Surgeon: Arta Silence, MD;  Location: [facility] ENDOSCOPY;  Service: Endoscopy;  Laterality: Left;  . Femoral-femoral bypass graft Bilateral 05/01/2013    Procedure: BYPASS GRAFT FEMORAL-FEMORAL ARTERY/ LEFT - RIGHT;  Surgeon: Rosetta Posner, MD;  Location: [place];  Service: Vascular;  Laterality: Bilateral;  . Abdominal aortagram N/A 04/25/2013    Procedure: ABDOMINAL Maxcine Ham;   Surgeon: Conrad [place], MD;  Location: [facility] CATH LAB;  Service: Cardiovascular;  Laterality: N/A;    Family History  Problem Relation Age of Onset  . Hypertension Mother   . Cancer Mother     colon cancer  . Stroke Mother   . Aneurysm Father     abdominal aortic  . Heart disease Brother     Social history:  reports that he has been smoking Cigarettes.  He has a 31.5 pack-year smoking history. He has never used smokeless tobacco. He reports that he drinks alcohol. He reports that he does not use illicit drugs.    Allergies  Allergen Reactions  . Lipitor [Atorvastatin Calcium] Other (See Comments)    Myalgias "don't remember how bad" (01/31/2012)  . Penicillins Other (See Comments)    Passed out    Medications:  Current Outpatient Prescriptions on File Prior to Visit  Medication Sig Dispense Refill  . albuterol (PROVENTIL) (2.5 MG/3ML) 0.083% nebulizer solution Take 2.5 mg by nebulization every 4 (four) hours as needed for wheezing or shortness of breath.    . ALPRAZolam (XANAX) 1 MG tablet Take 1 and 1/2 tablet by mouth up to twice daily as needed (Patient taking differently: Take 1.5 mg by mouth 2 (two) times daily as needed for anxiety. ) 60 tablet 5  . Fluticasone-Salmeterol (ADVAIR) 250-50 MCG/DOSE AEPB Inhale 1 puff into the lungs every 12 (twelve) hours. 2 each 0  . predniSONE (DELTASONE) 10 MG tablet Take 10 mg by mouth daily with breakfast.    . rosuvastatin (CRESTOR) 20 MG tablet Take 20 mg by mouth at bedtime.     . sertraline (ZOLOFT) 50 MG tablet Take 1.5 tablets (75 mg total) by mouth at bedtime. 30 tablet 5   No current facility-administered medications on file prior to visit.    ROS:  Out of a complete 14 system review of symptoms, the patient complains only of the following symptoms, and all other reviewed systems are negative.  Fatigue Difficulty swallowing, hearing loss left ear Eye discharge, blurred vision Wheezing, shortness of breath Chest pain, leg  swelling Cold intolerance Abdominal pain Restless legs Painful urination, frequency of urination Joint pain, joint swelling, back pain, achy muscles, muscle cramps, walking difficulties, neck pain, neck stiffness Skin wounds Bruising easily Dizziness, weakness Depression  Blood pressure 177/77, pulse 89, height 5\' 8"  (1.727 m), weight 111 lb 6.4 oz (50.531 kg).  Physical Exam  General: The patient is alert and cooperative at the time of the examination.  Skin: No significant peripheral edema is noted. Some redness of the right leg below the knee is noted. The right knee is fused.   Neurologic Exam  Mental status: The patient is oriented x 3.  Cranial nerves: Facial symmetry is present. Speech is normal, no aphasia or  dysarthria is noted. Extraocular movements are full. Visual fields are full.  Motor: The patient has good strength in all 4 extremities.  Sensory examination: Soft touch sensation is symmetric on the face, arms, and legs.  Coordination: The patient has good finger-nose-finger bilaterally.  Gait and station: The patient has a fused right knee. He walks with a walker. Tandem gait was not tested.  Reflexes: Deep tendon reflexes are symmetric.   Assessment/Plan:  1. Peripheral neuropathy  2. Peripheral vascular disease  3. Chest mass, left upper lobe  4. Gait disorder  5. Anorexia, weight loss  The patient likely has lung cancer, this likely explains the unintended weight loss. The patient continues to have severe underlying pain associated with his peripheral vascular disease and peripheral neuropathy. We will increase the number of oxycodone tablets that he can get a month. He will follow-up in 6 months. The patient has been able to maintain his weight.  Jill Alexanders MD 04/03/2014 8:21 PM  [facility] [address] [place], [place] [address]  Phone [phone] Fax [phone]

## 2014-04-03 NOTE — Patient Instructions (Signed)

## 2014-04-07 ENCOUNTER — Ambulatory Visit (HOSPITAL_COMMUNITY)
Admission: RE | Admit: 2014-04-07 | Discharge: 2014-04-07 | Disposition: A | Discharge status: Home / Self Care | Payer: Medicare Other | Source: Ambulatory Visit | Attending: Adult Health | Admitting: Adult Health

## 2014-04-07 DIAGNOSIS — R918 Other nonspecific abnormal finding of lung field: Secondary | ICD-10-CM | POA: Diagnosis present

## 2014-04-07 LAB — GLUCOSE, CAPILLARY: GLUCOSE-CAPILLARY: 124 mg/dL — AB (ref 70–99)

## 2014-04-07 MED ORDER — FLUDEOXYGLUCOSE F - 18 (FDG) INJECTION
5.6000 | Freq: Once | INTRAVENOUS | Status: AC | PRN
  Administered 2014-04-07: 5.6 via INTRAVENOUS

## 2014-04-10 ENCOUNTER — Telehealth: Payer: Self-pay | Admitting: Interventional Radiology

## 2014-04-10 NOTE — Progress Notes (Signed)
Asked by Clemetine Marker NP to review PET/CT re possible biopsy.  LUL lesion accessible for percutaneous bx but high risk due to regional parenchymal disease (emphysema). No accessible alternative lesions on PET. We can schedule and  attempt if needed and pt accepts risk.

## 2014-04-11 ENCOUNTER — Encounter: Payer: Self-pay | Admitting: Adult Health

## 2014-04-11 ENCOUNTER — Ambulatory Visit (INDEPENDENT_AMBULATORY_CARE_PROVIDER_SITE_OTHER): Payer: Medicare Other | Admitting: Adult Health

## 2014-04-11 VITALS — BP 136/80 | HR 93 | Temp 97.9°F | Ht 68.0 in | Wt 112.0 lb

## 2014-04-11 DIAGNOSIS — J449 Chronic obstructive pulmonary disease, unspecified: Secondary | ICD-10-CM

## 2014-04-11 DIAGNOSIS — R918 Other nonspecific abnormal finding of lung field: Secondary | ICD-10-CM

## 2014-04-11 MED ORDER — FLUTICASONE-SALMETEROL 250-50 MCG/DOSE IN AEPB: 1.0000 | INHALATION_SPRAY | Freq: Two times a day (BID) | RESPIRATORY_TRACT | Status: AC

## 2014-04-11 NOTE — Patient Instructions (Signed)
We are going to set you up for CT guided biopsy.  We will call with results of biopsy as they come in. And decide on next step  Mucinex DM Twice daily As needed  .  follow up Dr. Chase Caller in 6 weeks

## 2014-04-11 NOTE — Progress Notes (Signed)
Subjective:    Patient ID: [name], male    DOB: 27-Mar-1934, 80 y.o.   MRN: [id number]  HPI  #COPD  - pirometry 06/25/12"  fev1 Pos BD fev1 1.17L/42% (+5% bd response), FVC 3.4L/88%, R 34, DLCO 36% and is consistent with Gold stage III COPD.  #CAchexia/ sever malnutrition  - Body mass index is 17.38 kg/(m^2). 03/13/2014   #Chronic pain   OV 03/13/2014  Chief Complaint  Patient presents with  . Follow-up    Pt c/o increase in SOB, prod cough with clear mucus, chest congestion and chest tightness x 1 month. pt stated his s/s are worsening.     80 year old male with severe COPD, cachexia/severe malnutrition, chronic pain and significant debility. Presents for follow-up last seen in our office this summer 2014 for COPD exacerbation. I do not know the details of his absence for one year. He says that in the last month or so he's had increased cough, sputum production, change in color of sputum to green, chest tightness that is worse, increased wheezing. He feels and is in a chronic state of exacerbation. He was started by his primary care physician on prednisone and is in the midst of a taper of this is not fully helping.also find out that he is not using his Spiriva and Advair because of financial constraints.  He is also dealing with chronic pain, loneliness and severe COPD and cachexia that is limiting his mobility and independence and mood. He is describing for the first time a strong sense that his prognosis is limited and that he will die soon from a COPD   CAT COPD Symptom & Quality of Life Score ([place]) 0 is no burden. 5 is highest burden 02/27/2012  06/25/2012  09/13/2012  03/13/2014   Never Cough -> Cough all the time 2 1 2    No phlegm in chest -> Chest is full of phlegm 3 3 3    No chest tightness -> Chest feels very tight 4 3 2    No dyspnea for 1 flight stairs/hill -> Very dyspneic for 1 flight of stairs 5 5 4    No limitations for ADL at home -> Very limited with  ADL at home 5 5 5    Confident leaving home -> Not at all confident leaving home 5 3 4    Sleep soundly -> Do not sleep soundly because of lung condition 4 4 4    Lots of Energy -> No energy at all 4 4 3    TOTAL Score (max 40)  32 28 27   BMI  Body mass index is 17.64 kg/(m^2).  Body mass index is 18.22 kg/(m^2).  Body mass index is 17.38 kg/(m^2).     03/21/14 Acute OV  Patient complains of increased cough, congestion, shortness of breath and weakness. Chest x-ray showed a nodular lesion in the left upper lobe measuring 3.5 x 3.3 cm. We discussed these results and the need to follow-up with a CT chest. Last visit. Patient was recommended for hospice referral. He had a hospice evaluation and told that he was not considered hospice eligible. Very upset he did not qualify for hospice .  He was also told that his life expectancy was written down for 6 months and last and he was very upset about this. We had a long discussion about his and prognosis. Patient complains that he is coughing up very thick, yellow-green mucus. He denies any hemoptysis, orthopnea, PND or leg swelling. Weight has been slowly trending down over  the last year. Patient complains over the last year. He's been getting weaker with no energy and gets winded with minimal activity.   04/11/2014 Follow up COPD /LUng Mass  Patient returns to discuss his recent test results He  was seen 2 weeks ago for COPD exacerbation He was treated with antibiotics  Symptoms improved. Chest x-ray showed a lung mass Patient was sent for a CT chest that showed a 3.7 cm mass in the left upper lobe. He was then sent for a PET scan that showed a hypermetabolic left upper lung mass with mildly hypermetabolic left subcarinal lymph node No evidence of metastatic disease in abdomen or pelvis. We discussed these test results. We went biopsy options. He'll be set up for a CT-guided biopsy. Patient was advised on risk and benefits.. Discussed his  case with the interventional radiologist and Dr. Chase Caller. Patient denies any hemoptysis, chest pain, orthopnea, PND. Patient support was provided.    review of Systems  Constitutional:   No  weight loss, night sweats,  Fevers, chills,  +fatigue, or  lassitude.  HEENT:   No headaches,  Difficulty swallowing,  Tooth/dental problems, or  Sore throat,                No sneezing, itching, ear ache,  +nasal congestion, post nasal drip,   CV:  No chest pain,  Orthopnea, PND, swelling in lower extremities, anasarca, dizziness, palpitations, syncope.   GI  No heartburn, indigestion, abdominal pain, nausea, vomiting, diarrhea, change in bowel habits, loss of appetite, bloody stools.   Resp:  .  No chest wall deformity  Skin: no rash or lesions.  GU: no dysuria, change in color of urine, no urgency or frequency.  No flank pain, no hematuria   MS:  No joint pain or swelling.  No decreased range of motion.  No back pain.  Psych:  No change in mood or affect. No depression or anxiety.  No memory loss.     '       Objective:   Physical Exam  GEN: A/Ox3; pleasant , NAD, frail and elderly , cachexic   HEENT:  [place]/AT,  EACs-clear, TMs-wnl, NOSE-clear, THROAT-clear, no lesions, no postnasal drip or exudate noted.   NECK:  Supple w/ fair ROM; no JVD; normal carotid impulses w/o bruits; no thyromegaly or nodules palpated; no lymphadenopathy.  RESP  Decreased BS in bases no accessory muscle use, no dullness to percussion  CARD:  RRR, no m/r/g  , no peripheral edema, pulses intact, no cyanosis or clubbing.  GI:   Soft & nt; nml bowel sounds; no organomegaly or masses detected.  Musco: Warm bil, no deformities or joint swelling noted.   Neuro: alert, no focal deficits noted.    Skin: Warm, no lesions or rashes           Assessment & Plan:

## 2014-04-11 NOTE — Assessment & Plan Note (Signed)
Recent exacerbation , resolving

## 2014-04-11 NOTE — Assessment & Plan Note (Signed)
Left upper lobe lung mass measuring 3.7 cm that is hypermetabolic on PET scan C/w lung cancer  he will undergo a CT-guided biopsy , risk and benefits discussed with patient Once pathology is returned or refer to oncology vs [facility]  Doubt  surgical candidate with lung fxn , debilitation .

## 2014-04-21 ENCOUNTER — Other Ambulatory Visit: Payer: Self-pay | Admitting: Radiology

## 2014-04-23 ENCOUNTER — Ambulatory Visit (HOSPITAL_COMMUNITY): Admission: RE | Admit: 2014-04-23 | Payer: Medicare Other | Source: Ambulatory Visit

## 2014-04-30 ENCOUNTER — Telehealth: Payer: Self-pay | Admitting: Neurology

## 2014-04-30 MED ORDER — OXYCODONE HCL 30 MG PO TABS: 30.0000 mg | ORAL_TABLET | ORAL | Status: DC | PRN

## 2014-04-30 NOTE — Telephone Encounter (Signed)
Request entered, forwarded to provider for approval.  

## 2014-04-30 NOTE — Telephone Encounter (Signed)
Pt calling requesting written Rx for oxycodone (ROXICODONE) 30 MG immediate release tablet. Please call when ready for pick up.

## 2014-05-05 ENCOUNTER — Ambulatory Visit (HOSPITAL_COMMUNITY): Admission: RE | Admit: 2014-05-05 | Payer: Medicare Other | Source: Ambulatory Visit

## 2014-05-05 NOTE — Telephone Encounter (Signed)
Patient calling back regarding Rx oxycodone.  Please call and advise.

## 2014-05-05 NOTE — Telephone Encounter (Signed)
Message sent to clinic to follow up.

## 2014-05-05 NOTE — Telephone Encounter (Signed)
Larey Seat verified Rx is ready for pick up at the front desk.  I called the patient back.  He is aware.

## 2014-05-14 ENCOUNTER — Ambulatory Visit: Payer: Medicare Other | Admitting: Internal Medicine

## 2014-06-02 ENCOUNTER — Other Ambulatory Visit: Payer: Self-pay | Admitting: Neurology

## 2014-06-02 MED ORDER — OXYCODONE HCL 30 MG PO TABS: 30.0000 mg | ORAL_TABLET | ORAL | Status: DC | PRN

## 2014-06-02 NOTE — Telephone Encounter (Signed)
Dr Jannifer Franklin is out of the office.  Request entered, forwarded to [facility] for review.

## 2014-06-02 NOTE — Telephone Encounter (Signed)
Pt is calling requesting a written Rx for oxycodone (ROXICODONE) 30 MG immediate release tablet. Please call when ready for pick up.

## 2014-06-03 ENCOUNTER — Telehealth: Payer: Self-pay | Admitting: Neurology

## 2014-06-03 NOTE — Telephone Encounter (Signed)
Patient requesting sooner appointment than the one he has scheduled in August. His legs and feet are swollen and he is having trouble sleeping. He requires an afternoon appointment. Best call back number is [phone]

## 2014-06-04 NOTE — Telephone Encounter (Signed)
I called the patient and made an appointment for 4/21 at 3 PM.

## 2014-06-05 ENCOUNTER — Encounter: Payer: Self-pay | Admitting: Neurology

## 2014-06-05 ENCOUNTER — Ambulatory Visit (INDEPENDENT_AMBULATORY_CARE_PROVIDER_SITE_OTHER): Payer: Medicare Other | Admitting: Neurology

## 2014-06-05 VITALS — BP 130/63 | HR 80 | Ht 68.0 in | Wt 111.2 lb

## 2014-06-05 DIAGNOSIS — G63 Polyneuropathy in diseases classified elsewhere: Secondary | ICD-10-CM | POA: Diagnosis not present

## 2014-06-05 DIAGNOSIS — E43 Unspecified severe protein-calorie malnutrition: Secondary | ICD-10-CM

## 2014-06-05 DIAGNOSIS — M47817 Spondylosis without myelopathy or radiculopathy, lumbosacral region: Secondary | ICD-10-CM

## 2014-06-05 DIAGNOSIS — E41 Nutritional marasmus: Secondary | ICD-10-CM | POA: Diagnosis not present

## 2014-06-05 MED ORDER — GABAPENTIN 300 MG PO CAPS: 300.0000 mg | ORAL_CAPSULE | Freq: Three times a day (TID) | ORAL | Status: DC

## 2014-06-05 NOTE — Patient Instructions (Signed)

## 2014-06-05 NOTE — Progress Notes (Signed)
Reason for visit: Peripheral neuropathy  [name] is an 80 y.o. male  History of present illness:  [name] is a 80 year old right-handed white male with a history of peripheral vascular disease, and a peripheral neuropathy. The patient has had a fusion of the right knee, and he has a gait disorder. The patient has diffuse spine pain in the neck and low back. He recently was found to have a lung mass, and likely has lung cancer. The patient is not yet had his lung biopsy done. He is followed by pulmonology. He has not yet been referred to oncology. He is on chronic daily doses of opiate medications. Is not sleeping well, only a few hours a day. He believes his pain has continued to increase. He returns for an evaluation.  Past Medical History  Diagnosis Date  . History of colon polyps   . Spinal stenosis   . Anxiety   . BPH (benign prostatic hypertrophy)   . ED (erectile dysfunction)   . Osteoporosis   . Hypertension   . Chest pain     "I've had it" (01/31/2012)   . Peripheral vascular disease     "right leg is 100% blocked; left leg has stent, still ~ 75% blocked" (01/31/2012)  . Peripheral neuropathy     "bad" (01/31/2012)  . COPD (chronic obstructive pulmonary disease)   . Emphysema   . BRBPR (bright red blood per rectum)     "don't know what it's from; had some this week" (01/31/2012)  . Chronic lower back pain   . Pernicious anemia   . GERD (gastroesophageal reflux disease)   . Polyneuropathy in other diseases classified elsewhere 11/06/2012  . Prostate cancer 2004    "had 40 treatments of radiation" (01/31/2012)  . Breast cancer in male     "left" (01/31/2012)  . Basal cell carcinoma of nasal tip   . Pneumonia 1960's    "double"   . Cervical spondylosis   . Rheumatoid arthritis(714.0)   . Arthritis     "all over my body" (04/25/2013)  . DDD (degenerative disc disease), lumbosacral     "S1; L5" (01/31/2012)  . Lumbosacral spondylosis without myelopathy  11/06/2012  . Cellulitis of right leg   . Depression     "won't take RX though" (04/25/2013)  . Falls frequently   . Severe malnutrition 11/27/2013  . Hyperlipidemia     Past Surgical History  Procedure Laterality Date  . Basal cell carcinoma excision      Nose x 3  . Cholecystectomy  ?1990's  . Wrist fusion Right     "3 OR's; it's fused" (01/31/2012)  . Excisional hemorrhoidectomy  1960's  . Knee surgery Right      6 surgeries; went in for simple cartilage OR; ended up w/fused knee" (01/31/2012)  . Iliac artery stent  12-07-09    Stent done by Dr. Gwenlyn Found  . Total hip arthroplasty  08/16/2011    Procedure: TOTAL HIP ARTHROPLASTY ANTERIOR APPROACH;  Surgeon: Mcarthur Rossetti, MD;  Location: [facility];  Service: Orthopedics;  Laterality: Right;  Right total hip replacement  . Cervical disc surgery      "7 total; ended w/a fusion" (01/31/2012)  . Esophagogastroduodenoscopy  02/22/2012    Procedure: ESOPHAGOGASTRODUODENOSCOPY (EGD);  Surgeon: Arta Silence, MD;  Location: [facility];  Service: Endoscopy;  Laterality: N/A;  . Eus  02/22/2012    Procedure: UPPER ENDOSCOPIC ULTRASOUND (EUS) RADIAL;  Surgeon: Arta Silence, MD;  Location: [facility] ENDOSCOPY;  Service: Endoscopy;  Laterality: N/A;  . Hernia repair    . Transurethral resection of prostate    . Aortogram  04/25/2013  . Esophagogastroduodenoscopy Left 04/27/2013    Procedure: ESOPHAGOGASTRODUODENOSCOPY (EGD);  Surgeon: Arta Silence, MD;  Location: [facility] ENDOSCOPY;  Service: Endoscopy;  Laterality: Left;  . Femoral-femoral bypass graft Bilateral 05/01/2013    Procedure: BYPASS GRAFT FEMORAL-FEMORAL ARTERY/ LEFT - RIGHT;  Surgeon: Rosetta Posner, MD;  Location: [facility];  Service: Vascular;  Laterality: Bilateral;  . Abdominal aortagram N/A 04/25/2013    Procedure: ABDOMINAL Maxcine Ham;  Surgeon: Conrad Hydro, MD;  Location: [facility] CATH LAB;  Service: Cardiovascular;  Laterality: N/A;    Family History  Problem Relation Age of Onset  .  Hypertension Mother   . Cancer Mother     colon cancer  . Stroke Mother   . Aneurysm Father     abdominal aortic  . Heart disease Brother     Social history:  reports that he has been smoking Cigarettes.  He has a 31.5 pack-year smoking history. He has never used smokeless tobacco. He reports that he does not drink alcohol or use illicit drugs.    Allergies  Allergen Reactions  . Lipitor [Atorvastatin Calcium] Other (See Comments)    Myalgias "don't remember how bad" (01/31/2012)    Medications:  Prior to Admission medications   Medication Sig Start Date End Date Taking? Authorizing Provider  albuterol (PROVENTIL) (2.5 MG/3ML) 0.083% nebulizer solution Take 2.5 mg by nebulization every 4 (four) hours as needed for wheezing or shortness of breath.   Yes Historical Provider, MD  ALPRAZolam Duanne Moron) 1 MG tablet Take 1 and 1/2 tablet by mouth up to twice daily as needed Patient taking differently: Take 1.5 mg by mouth 2 (two) times daily as needed for anxiety.  12/30/13  Yes Hennie Duos, MD  Fluticasone-Salmeterol (ADVAIR) 250-50 MCG/DOSE AEPB Inhale 1 puff into the lungs every 12 (twelve) hours. 04/11/14  Yes Tammy S Parrett, NP  oxycodone (ROXICODONE) 30 MG immediate release tablet Take 1 tablet (30 mg total) by mouth every 3 (three) hours as needed for pain (Must last 28 days). 06/02/14  Yes Britt Bottom, MD  rosuvastatin (CRESTOR) 20 MG tablet Take 20 mg by mouth at bedtime.    Yes Historical Provider, MD  sertraline (ZOLOFT) 50 MG tablet Take 1.5 tablets (75 mg total) by mouth at bedtime. Patient not taking: Reported on 04/30/2014 03/13/14   Brand Males, MD    ROS:  Out of a complete 14 system review of symptoms, the patient complains only of the following symptoms, and all other reviewed systems are negative.  Ear discharge, difficulty swallowing Light sensitivity, decreased vision, blurred vision Leg pain, neck pain Walking difficulty  Blood pressure 130/63, pulse  80, height '5\' 8"'$  (1.727 m), weight 111 lb 3.2 oz (50.44 kg).  Physical Exam  General: The patient is alert and cooperative at the time of the examination.  Skin: 1-2+ edema below the knees is noted bilaterally. Rubor is seen in the skin below the knees bilaterally.   Neurologic Exam  Mental status: The patient is alert and oriented x 3 at the time of the examination. The patient has apparent normal recent and remote memory, with an apparently normal attention span and concentration ability.   Cranial nerves: Facial symmetry is present. Speech is normal, no aphasia or dysarthria is noted. Extraocular movements are full. Visual fields are full.  Motor: The patient has good strength in all 4  extremities. The right knee is fused.  Sensory examination: Soft touch sensation is symmetric on the face, arms, and legs.  Coordination: The patient has good finger-nose-finger and heel-to-shin bilaterally.  Gait and station: The patient walks with a walker, slightly wide-based gait. Good stability with using a walker.  Reflexes: Deep tendon reflexes are symmetric, but are depressed.   Assessment/Plan:  1. Peripheral neuropathy  2. Cervical and lumbar spondylosis  3. Peripheral vascular disease  4. Gait disorder  5. Lung mass  The patient will continue the opiate medications for chronic pain. He will be given a prescription for gabapentin taking 300 mg 3 times daily. He will follow-up in August 2016. The patient will need to pursue further evaluation for the lung mass.  Jill Alexanders MD 06/05/2014 5:44 PM  [facility] [address] [place], [place] [address]  Phone [phone] Fax [phone]

## 2014-06-09 ENCOUNTER — Encounter (HOSPITAL_COMMUNITY): Payer: Self-pay | Admitting: Emergency Medicine

## 2014-06-09 ENCOUNTER — Emergency Department (HOSPITAL_COMMUNITY): Payer: Medicare Other

## 2014-06-09 ENCOUNTER — Emergency Department (HOSPITAL_COMMUNITY)
Admission: EM | Admit: 2014-06-09 | Discharge: 2014-06-10 | Disposition: A | Discharge status: Home / Self Care | Payer: Medicare Other | Attending: Emergency Medicine | Admitting: Emergency Medicine

## 2014-06-09 DIAGNOSIS — Z87448 Personal history of other diseases of urinary system: Secondary | ICD-10-CM | POA: Diagnosis not present

## 2014-06-09 DIAGNOSIS — F329 Major depressive disorder, single episode, unspecified: Secondary | ICD-10-CM | POA: Insufficient documentation

## 2014-06-09 DIAGNOSIS — Z85828 Personal history of other malignant neoplasm of skin: Secondary | ICD-10-CM | POA: Insufficient documentation

## 2014-06-09 DIAGNOSIS — G8929 Other chronic pain: Secondary | ICD-10-CM | POA: Diagnosis not present

## 2014-06-09 DIAGNOSIS — Z8739 Personal history of other diseases of the musculoskeletal system and connective tissue: Secondary | ICD-10-CM | POA: Insufficient documentation

## 2014-06-09 DIAGNOSIS — S51011A Laceration without foreign body of right elbow, initial encounter: Secondary | ICD-10-CM

## 2014-06-09 DIAGNOSIS — E785 Hyperlipidemia, unspecified: Secondary | ICD-10-CM | POA: Diagnosis not present

## 2014-06-09 DIAGNOSIS — W01198A Fall on same level from slipping, tripping and stumbling with subsequent striking against other object, initial encounter: Secondary | ICD-10-CM | POA: Insufficient documentation

## 2014-06-09 DIAGNOSIS — S59901A Unspecified injury of right elbow, initial encounter: Secondary | ICD-10-CM | POA: Diagnosis present

## 2014-06-09 DIAGNOSIS — Z9181 History of falling: Secondary | ICD-10-CM | POA: Diagnosis not present

## 2014-06-09 DIAGNOSIS — F419 Anxiety disorder, unspecified: Secondary | ICD-10-CM | POA: Insufficient documentation

## 2014-06-09 DIAGNOSIS — Z951 Presence of aortocoronary bypass graft: Secondary | ICD-10-CM | POA: Diagnosis not present

## 2014-06-09 DIAGNOSIS — Y998 Other external cause status: Secondary | ICD-10-CM | POA: Diagnosis not present

## 2014-06-09 DIAGNOSIS — Z86010 Personal history of colonic polyps: Secondary | ICD-10-CM | POA: Diagnosis not present

## 2014-06-09 DIAGNOSIS — Y92194 Driveway of other specified residential institution as the place of occurrence of the external cause: Secondary | ICD-10-CM | POA: Insufficient documentation

## 2014-06-09 DIAGNOSIS — Z9861 Coronary angioplasty status: Secondary | ICD-10-CM | POA: Insufficient documentation

## 2014-06-09 DIAGNOSIS — Z720 Tobacco use: Secondary | ICD-10-CM | POA: Diagnosis not present

## 2014-06-09 DIAGNOSIS — Z79899 Other long term (current) drug therapy: Secondary | ICD-10-CM | POA: Diagnosis not present

## 2014-06-09 DIAGNOSIS — Z853 Personal history of malignant neoplasm of breast: Secondary | ICD-10-CM | POA: Insufficient documentation

## 2014-06-09 DIAGNOSIS — G629 Polyneuropathy, unspecified: Secondary | ICD-10-CM | POA: Diagnosis not present

## 2014-06-09 DIAGNOSIS — Y9389 Activity, other specified: Secondary | ICD-10-CM | POA: Insufficient documentation

## 2014-06-09 DIAGNOSIS — W19XXXA Unspecified fall, initial encounter: Secondary | ICD-10-CM

## 2014-06-09 DIAGNOSIS — R918 Other nonspecific abnormal finding of lung field: Secondary | ICD-10-CM | POA: Insufficient documentation

## 2014-06-09 DIAGNOSIS — Z872 Personal history of diseases of the skin and subcutaneous tissue: Secondary | ICD-10-CM | POA: Diagnosis not present

## 2014-06-09 DIAGNOSIS — I10 Essential (primary) hypertension: Secondary | ICD-10-CM | POA: Diagnosis not present

## 2014-06-09 DIAGNOSIS — S51001A Unspecified open wound of right elbow, initial encounter: Secondary | ICD-10-CM | POA: Insufficient documentation

## 2014-06-09 DIAGNOSIS — J449 Chronic obstructive pulmonary disease, unspecified: Secondary | ICD-10-CM | POA: Diagnosis not present

## 2014-06-09 DIAGNOSIS — Z8546 Personal history of malignant neoplasm of prostate: Secondary | ICD-10-CM | POA: Insufficient documentation

## 2014-06-09 DIAGNOSIS — M25521 Pain in right elbow: Secondary | ICD-10-CM

## 2014-06-09 MED ORDER — OXYCODONE HCL 5 MG PO TABS
30.0000 mg | ORAL_TABLET | Freq: Once | ORAL | Status: AC
  Administered 2014-06-10: 30 mg via ORAL
  Filled 2014-06-09: qty 6

## 2014-06-09 MED ORDER — TETANUS-DIPHTH-ACELL PERTUSSIS 5-2.5-18.5 LF-MCG/0.5 IM SUSP: 0.5000 mL | Freq: Once | INTRAMUSCULAR | Status: DC

## 2014-06-09 MED ORDER — ALBUTEROL SULFATE HFA 108 (90 BASE) MCG/ACT IN AERS
2.0000 | INHALATION_SPRAY | RESPIRATORY_TRACT | Status: DC
  Administered 2014-06-10: 2 via RESPIRATORY_TRACT
  Filled 2014-06-09: qty 6.7

## 2014-06-09 MED ORDER — MOMETASONE FURO-FORMOTEROL FUM 100-5 MCG/ACT IN AERO
2.0000 | INHALATION_SPRAY | RESPIRATORY_TRACT | Status: DC
  Filled 2014-06-09: qty 8.8

## 2014-06-09 NOTE — ED Notes (Signed)
Patient transported to X-ray 

## 2014-06-09 NOTE — ED Notes (Addendum)
Pt fell going out of his house earlier today.  Pt has lac to right elbow and also hit his head on concrete. ? LOC for short period.  Pt alert and oriented x's 3 at this time.  Pt c/o feeling dizzy.  Pt not on any blood thinners

## 2014-06-09 NOTE — ED Provider Notes (Signed)
CSN: [id number]     Arrival date & time 06/09/14  1556 History  This chart was scribed for Jola Schmidt, MD by Rayfield Citizen, ED Scribe. This patient was seen in room D32C/D32C and the patient's care was started at 11:14 PM.    Chief Complaint  Patient presents with  . Fall   Patient is a 80 y.o. male presenting with fall. The history is provided by the patient and a relative. No language interpreter was used.  Fall   HPI Comments: [name] is a 80 y.o. male who presents to the Emergency Department complaining of fall. Patient explains he was exiting his home earlier today when he had a mechanical fall onto the concrete of his driveway. According to family, patient previously complained of a head injury with near syncope associated with this fall, but patient now denies this. Patient currently complains of mild dizziness (which he largely associates with his baseline eye problems) and an abrasion to the right elbow. He states normally ambulates with a walker but was not using it at the time of the fall today. He denies new or unusual neck pain, nausea or vomiting.   Patient's son also reports concerns for pneumonia. Patient denies fever.   Last tetanus shot 1 year PTA. He denies anticoagulant use. Patient takes oxycodone '30mg'$  3-4x per day for pain; also utilizes albuterol nebulizer every 4 hours as needed for wheezing or SOB.   Past Medical History  Diagnosis Date  . History of colon polyps   . Spinal stenosis   . Anxiety   . BPH (benign prostatic hypertrophy)   . ED (erectile dysfunction)   . Osteoporosis   . Hypertension   . Chest pain     "I've had it" (01/31/2012)   . Peripheral vascular disease     "right leg is 100% blocked; left leg has stent, still ~ 75% blocked" (01/31/2012)  . Peripheral neuropathy     "bad" (01/31/2012)  . COPD (chronic obstructive pulmonary disease)   . Emphysema   . BRBPR (bright red blood per rectum)     "don't know what it's from; had some this  week" (01/31/2012)  . Chronic lower back pain   . Pernicious anemia   . GERD (gastroesophageal reflux disease)   . Polyneuropathy in other diseases classified elsewhere 11/06/2012  . Prostate cancer 2004    "had 40 treatments of radiation" (01/31/2012)  . Breast cancer in male     "left" (01/31/2012)  . Basal cell carcinoma of nasal tip   . Pneumonia 1960's    "double"   . Cervical spondylosis   . Rheumatoid arthritis(714.0)   . Arthritis     "all over my body" (04/25/2013)  . DDD (degenerative disc disease), lumbosacral     "S1; L5" (01/31/2012)  . Lumbosacral spondylosis without myelopathy 11/06/2012  . Cellulitis of right leg   . Depression     "won't take RX though" (04/25/2013)  . Falls frequently   . Severe malnutrition 11/27/2013  . Hyperlipidemia    Past Surgical History  Procedure Laterality Date  . Basal cell carcinoma excision      Nose x 3  . Cholecystectomy  ?1990's  . Wrist fusion Right     "3 OR's; it's fused" (01/31/2012)  . Excisional hemorrhoidectomy  1960's  . Knee surgery Right      6 surgeries; went in for simple cartilage OR; ended up w/fused knee" (01/31/2012)  . Iliac artery stent  12-07-09  Stent done by Dr. Gwenlyn Found  . Total hip arthroplasty  08/16/2011    Procedure: TOTAL HIP ARTHROPLASTY ANTERIOR APPROACH;  Surgeon: Mcarthur Rossetti, MD;  Location: [facility];  Service: Orthopedics;  Laterality: Right;  Right total hip replacement  . Cervical disc surgery      "7 total; ended w/a fusion" (01/31/2012)  . Esophagogastroduodenoscopy  02/22/2012    Procedure: ESOPHAGOGASTRODUODENOSCOPY (EGD);  Surgeon: Arta Silence, MD;  Location: [facility];  Service: Endoscopy;  Laterality: N/A;  . Eus  02/22/2012    Procedure: UPPER ENDOSCOPIC ULTRASOUND (EUS) RADIAL;  Surgeon: Arta Silence, MD;  Location: [facility];  Service: Endoscopy;  Laterality: N/A;  . Hernia repair    . Transurethral resection of prostate    . Aortogram  04/25/2013  .  Esophagogastroduodenoscopy Left 04/27/2013    Procedure: ESOPHAGOGASTRODUODENOSCOPY (EGD);  Surgeon: Arta Silence, MD;  Location: [facility] ENDOSCOPY;  Service: Endoscopy;  Laterality: Left;  . Femoral-femoral bypass graft Bilateral 05/01/2013    Procedure: BYPASS GRAFT FEMORAL-FEMORAL ARTERY/ LEFT - RIGHT;  Surgeon: Rosetta Posner, MD;  Location: [facility];  Service: Vascular;  Laterality: Bilateral;  . Abdominal aortagram N/A 04/25/2013    Procedure: ABDOMINAL Maxcine Ham;  Surgeon: Conrad Swisher, MD;  Location: [facility] CATH LAB;  Service: Cardiovascular;  Laterality: N/A;   Family History  Problem Relation Age of Onset  . Hypertension Mother   . Cancer Mother     colon cancer  . Stroke Mother   . Aneurysm Father     abdominal aortic  . Heart disease Brother    History  Substance Use Topics  . Smoking status: Current Every Day Smoker -- 0.50 packs/day for 63 years    Types: Cigarettes    Last Attempt to Quit: 04/30/2013  . Smokeless tobacco: Never Used  . Alcohol Use: No     Comment: 04/25/2013 "used to be a heavy drinker; stopped drinking in 1975"    Review of Systems  A complete 10 system review of systems was obtained and all systems are negative except as noted in the HPI and PMH.    Allergies  Lipitor  Home Medications   Prior to Admission medications   Medication Sig Start Date End Date Taking? Authorizing Provider  albuterol (PROVENTIL) (2.5 MG/3ML) 0.083% nebulizer solution Take 2.5 mg by nebulization every 4 (four) hours as needed for wheezing or shortness of breath.   Yes Historical Provider, MD  ALPRAZolam Duanne Moron) 1 MG tablet Take 1 and 1/2 tablet by mouth up to twice daily as needed Patient taking differently: Take 1.5 mg by mouth 2 (two) times daily as needed for anxiety.  12/30/13  Yes Hennie Duos, MD  Fluticasone-Salmeterol (ADVAIR) 250-50 MCG/DOSE AEPB Inhale 1 puff into the lungs every 12 (twelve) hours. 04/11/14  Yes Tammy S Parrett, NP  gabapentin (NEURONTIN) 300 MG  capsule Take 1 capsule (300 mg total) by mouth 3 (three) times daily. 06/05/14  Yes Kathrynn Ducking, MD  oxycodone (ROXICODONE) 30 MG immediate release tablet Take 1 tablet (30 mg total) by mouth every 3 (three) hours as needed for pain (Must last 28 days). 06/02/14  Yes Britt Bottom, MD  rosuvastatin (CRESTOR) 20 MG tablet Take 20 mg by mouth at bedtime.    Yes Historical Provider, MD  sertraline (ZOLOFT) 50 MG tablet Take 1.5 tablets (75 mg total) by mouth at bedtime. Patient not taking: Reported on 04/30/2014 03/13/14   Brand Males, MD   BP 142/71 mmHg  Pulse 70  Temp(Src)  97.7 F (36.5 C) (Oral)  Resp 17  Ht '5\' 8"'$  (1.727 m)  Wt 111 lb (50.349 kg)  BMI 16.88 kg/m2  SpO2 100% Physical Exam  Constitutional: He is oriented to person, place, and time. He appears well-developed and well-nourished.  HENT:  Head: Normocephalic and atraumatic.  Eyes: EOM are normal.  Neck: Normal range of motion.  Cardiovascular: Normal rate, regular rhythm, normal heart sounds and intact distal pulses.   Pulmonary/Chest: Effort normal. No respiratory distress. He has wheezes.  No rhonchi. Mild wheezing bilaterally.   Abdominal: Soft. He exhibits no distension. There is no tenderness.  Musculoskeletal: Normal range of motion.  No c-spine tenderness. Full ROM to right elbow, no obvious swelling. Full ROM of left hip and left knee; fused right knee.   Neurological: He is alert and oriented to person, place, and time.  Skin: Skin is warm and dry.  Multiple skin tears on right elbow  Psychiatric: He has a normal mood and affect. Judgment normal.  Nursing note and vitals reviewed.   ED Course  Procedures   DIAGNOSTIC STUDIES: Oxygen Saturation is 100% on [place] 2L/min, normal by my interpretation.    COORDINATION OF CARE: 11:27 PM Discussed treatment plan with pt at bedside and pt agreed to plan.   Labs Review Labs Reviewed - No data to display  Imaging Review Dg Chest 2 View  06/10/2014    CLINICAL DATA:  Status post fall; concern for chest injury. Initial encounter.  EXAM: CHEST  2 VIEW  COMPARISON:  Chest radiograph performed 03/21/2014, and PET/CT performed 04/07/2014  FINDINGS: The patient's left upper lobe lung mass has increased significantly in size, now measuring 5.6 cm, in comparison to 3.9 cm on the prior PET/CT, and 3.5 cm on the prior chest radiograph. Known underlying mediastinal lymphadenopathy is not well characterized on radiograph. Patchy left basilar airspace opacity may reflect atelectasis, though metastatic disease cannot be excluded. The right lung appears relatively clear.  The lungs are hyperexpanded, with flattening of the hemidiaphragms, compatible with COPD. A small left pleural effusion is noted. No pneumothorax is seen.  The heart is normal in size. No acute osseous abnormalities are identified.  IMPRESSION: 1. Left upper lobe lung mass has increased significantly in size, now measuring 5.6 cm, in comparison to 3.9 cm on the prior PET/CT, and 3.5 cm on the prior chest radiograph. This is compatible with primary bronchogenic malignancy. Known underlying mediastinal lymphadenopathy is not well characterized on radiograph. 2. Patchy left basilar airspace opacity may reflect atelectasis, though new metastatic disease cannot be excluded. Small left pleural effusion noted. 3. Findings of COPD.  These results were called by telephone at the time of interpretation on 06/10/2014 at 12:21 am to Dr. Jola Schmidt, who verbally acknowledged these results.   Electronically Signed   By: Garald Balding M.D.   On: 06/10/2014 00:22   Dg Elbow Complete Right  06/10/2014   CLINICAL DATA:  80 year old male with elbow laceration after falling  EXAM: RIGHT ELBOW - COMPLETE 3+ VIEW  COMPARISON:  None.  FINDINGS: There is no evidence of fracture, dislocation, or joint effusion. There is no evidence of arthropathy or other focal bone abnormality. Soft tissue swelling posterior to the elbow joint.   IMPRESSION: No acute fracture or malalignment.   Electronically Signed   By: Jacqulynn Cadet M.D.   On: 06/10/2014 00:16   Ct Head Wo Contrast  06/10/2014   CLINICAL DATA:  80 year old male status post fall while exiting has home earlier  today. Positive loss of consciousness.  EXAM: CT HEAD WITHOUT CONTRAST  CT CERVICAL SPINE WITHOUT CONTRAST  TECHNIQUE: Multidetector CT imaging of the head and cervical spine was performed following the standard protocol without intravenous contrast. Multiplanar CT image reconstructions of the cervical spine were also generated.  COMPARISON:  Prior PET-CT 04/07/2014; prior CT scan cervical spine 11/15/2012 ; prior CT scan of the head 08/14/2011  FINDINGS: CT HEAD FINDINGS  Negative for acute intracranial hemorrhage, acute infarction, mass, mass effect, hydrocephalus or midline shift. Gray-white differentiation is preserved throughout. No focal scalp hematoma or calvarial injury. Globes and orbits are intact bilaterally. Atherosclerotic calcifications in both cavernous carotid arteries.  CT CERVICAL SPINE FINDINGS  No acute fracture, malalignment or prevertebral soft tissue swelling. Surgical changes of prior anterior cervical discectomy and fusion at C4-C5. Additionally, there is bony ankylosis at C3-C4 and C5-C6, C6-C7. There is advanced multilevel associated degenerative disc disease as well as slight dextro convex scoliosis which may be positional. The bones appear osteopenic. Unremarkable CT appearance of the thyroid gland. No acute soft tissue abnormality. Advanced centrilobular emphysema in the bilateral upper lungs.  IMPRESSION: CT HEAD  1. No acute intracranial abnormality. 2. Intracranial atherosclerosis. CT CSPINE  1. No acute fracture or malalignment. 2. Surgical changes prior ACDF at C4-C5. 3. Extensive multi level cervical spondylosis with bony ankylosis at C3-C4 and C5-C6, C6-C7. 4. Advanced pulmonary emphysema.   Electronically Signed   By: Jacqulynn Cadet M.D.    On: 06/10/2014 00:46   Ct Cervical Spine Wo Contrast  06/10/2014   CLINICAL DATA:  80 year old male status post fall while exiting has home earlier today. Positive loss of consciousness.  EXAM: CT HEAD WITHOUT CONTRAST  CT CERVICAL SPINE WITHOUT CONTRAST  TECHNIQUE: Multidetector CT imaging of the head and cervical spine was performed following the standard protocol without intravenous contrast. Multiplanar CT image reconstructions of the cervical spine were also generated.  COMPARISON:  Prior PET-CT 04/07/2014; prior CT scan cervical spine 11/15/2012 ; prior CT scan of the head 08/14/2011  FINDINGS: CT HEAD FINDINGS  Negative for acute intracranial hemorrhage, acute infarction, mass, mass effect, hydrocephalus or midline shift. Gray-white differentiation is preserved throughout. No focal scalp hematoma or calvarial injury. Globes and orbits are intact bilaterally. Atherosclerotic calcifications in both cavernous carotid arteries.  CT CERVICAL SPINE FINDINGS  No acute fracture, malalignment or prevertebral soft tissue swelling. Surgical changes of prior anterior cervical discectomy and fusion at C4-C5. Additionally, there is bony ankylosis at C3-C4 and C5-C6, C6-C7. There is advanced multilevel associated degenerative disc disease as well as slight dextro convex scoliosis which may be positional. The bones appear osteopenic. Unremarkable CT appearance of the thyroid gland. No acute soft tissue abnormality. Advanced centrilobular emphysema in the bilateral upper lungs.  IMPRESSION: CT HEAD  1. No acute intracranial abnormality. 2. Intracranial atherosclerosis. CT CSPINE  1. No acute fracture or malalignment. 2. Surgical changes prior ACDF at C4-C5. 3. Extensive multi level cervical spondylosis with bony ankylosis at C3-C4 and C5-C6, C6-C7. 4. Advanced pulmonary emphysema.   Electronically Signed   By: Jacqulynn Cadet M.D.   On: 06/10/2014 00:46  I personally reviewed the imaging tests through PACS system I  reviewed available ER/hospitalization records through the EMR    EKG Interpretation None      MDM   Final diagnoses:  Fall, initial encounter  Skin tear of right elbow without complication, initial encounter  Right elbow pain  Lung mass    Isolated skin tears to right elbow.  CT head and C-spine without acute pathology.  Patient has known lung mass.  This appears to be enlarging.  He is scheduled to have his biopsy but he continues canceling his appointment.  I encouraged the patient strongly to keep this appointment, obtain his biopsy, initiate treatment for what appears to be worsening lung cancer.  I updated the family as to the results of the lung mass as well.  He tells me he has a pulmonary appointment tomorrow.   I personally performed the services described in this documentation, which was scribed in my presence. The recorded information has been reviewed and is accurate.        Jola Schmidt, MD 06/10/14 0110

## 2014-06-10 ENCOUNTER — Ambulatory Visit (INDEPENDENT_AMBULATORY_CARE_PROVIDER_SITE_OTHER): Payer: Medicare Other | Admitting: Internal Medicine

## 2014-06-10 ENCOUNTER — Other Ambulatory Visit (INDEPENDENT_AMBULATORY_CARE_PROVIDER_SITE_OTHER): Payer: Medicare Other

## 2014-06-10 ENCOUNTER — Encounter: Payer: Self-pay | Admitting: Internal Medicine

## 2014-06-10 VITALS — BP 100/54 | HR 90 | Ht 68.0 in | Wt 109.4 lb

## 2014-06-10 DIAGNOSIS — R918 Other nonspecific abnormal finding of lung field: Secondary | ICD-10-CM

## 2014-06-10 DIAGNOSIS — J441 Chronic obstructive pulmonary disease with (acute) exacerbation: Secondary | ICD-10-CM

## 2014-06-10 DIAGNOSIS — Z5181 Encounter for therapeutic drug level monitoring: Secondary | ICD-10-CM

## 2014-06-10 DIAGNOSIS — Z720 Tobacco use: Secondary | ICD-10-CM

## 2014-06-10 DIAGNOSIS — Z604 Social exclusion and rejection: Secondary | ICD-10-CM

## 2014-06-10 DIAGNOSIS — F17200 Nicotine dependence, unspecified, uncomplicated: Secondary | ICD-10-CM

## 2014-06-10 DIAGNOSIS — J449 Chronic obstructive pulmonary disease, unspecified: Secondary | ICD-10-CM

## 2014-06-10 LAB — CBC
HCT: 33.5 % — ABNORMAL LOW (ref 39.0–52.0)
HEMOGLOBIN: 11.1 g/dL — AB (ref 13.0–17.0)
MCHC: 33.1 g/dL (ref 30.0–36.0)
MCV: 92.6 fl (ref 78.0–100.0)
PLATELETS: 222 10*3/uL (ref 150.0–400.0)
RBC: 3.61 Mil/uL — ABNORMAL LOW (ref 4.22–5.81)
RDW: 15.9 % — AB (ref 11.5–15.5)
WBC: 7.4 10*3/uL (ref 4.0–10.5)

## 2014-06-10 LAB — PROTIME-INR
INR: 1.1 ratio — ABNORMAL HIGH (ref 0.8–1.0)
PROTHROMBIN TIME: 12.5 s (ref 9.6–13.1)

## 2014-06-10 LAB — APTT: APTT: 28 s (ref 23.4–32.7)

## 2014-06-10 MED ORDER — PREDNISONE 10 MG PO TABS: ORAL_TABLET | ORAL | Status: DC

## 2014-06-10 NOTE — Patient Instructions (Addendum)
ICD-9-CM ICD-10-CM   1. Lung mass 786.6 R91.8   2. COPD exacerbation 491.21 J44.1   3. Chronic obstructive pulmonary disease, unspecified COPD, unspecified chronic bronchitis type 496 J44.9     - please meet with our scheduler - work on getting CT guided lung biopsy in afternoon time if possible - Please take prednisone 40 mg x1 day, then 30 mg x1 day, then 20 mg x1 day, then 10 mg x1 day, and then 5 mg x1 day and stop - continue spiriva and advair   - take sample/refills - use albuterol as needed - quit smoking  Followup  - soon after lung biopsy < 3 weeks from now

## 2014-06-10 NOTE — Progress Notes (Signed)
Subjective:    Patient ID: [name], male    DOB: [birth year], 80 y.o.   MRN: [id number]  HPI    #COPD  - pirometry 06/25/12"  fev1 Pos BD fev1 1.17L/42% (+5% bd response), FVC 3.4L/88%, R 34, DLCO 36% and is consistent with Gold stage III COPD.  #CAchexia/ sever malnutrition  - Body mass index is 17.38 kg/(m^2). 03/13/2014  - Body mass index is 16.64 kg/(m^2). 06/10/2014  #Chronic pain  #Lung mass LUL  #SMoiker  reports that he has been smoking Cigarettes.  He has a 31.5 pack-year smoking history. He has never used smokeless tobacco.  #Social isolatrion   OV 06/10/2014  Chief Complaint  Patient presents with  . Follow-up    Pt had recent fall and had to visit ED. Pt c/o increase in SOB, wheezing and right upper CP. Pt denies cough   Follow-up for the above problems  -lung mass: In February 2016 he was diagnosed with a large left upper lobe lung mass. This was PET hot on PET scan. He also had a mediastinal node. He was set up for lung biopsy CT-guided by my nurse practitioner. But he did not show up for the lung biopsy. Most recentlyin fact yesterday was involved in a minor trauma and he had a chest x-ray that showed worsening left upper lobe mass. Therefore he is here today. He tells me that he goes to bed at 6 in the morning because of insomnia and therefore he is unable to have lung biopsy in the morning hours. Therefore this is a big scheduling issue for him. He refuses to have biopsy in the morning and apparently interventional radiology would not accommodate his request for an afternoon biopsy. Therefore he has not had a biopsy. This is despite the fact he understand this might be life-threatening lung cancer  - COPD:he is on Spiriva and Symbicort but has not taken his Spiriva recently. He is reporting some increased wheezing  - Smoking: He continues to smoke and will not quit  - Social isolation: It appears that his son is more involved in his life now. In fact this is  a first time I've ever met him. His son says that he is disabled as well as the daughter of the patient and that has been the difficulty but they do check on him every day  No other issues Review of Systems  Constitutional: Negative for fever and unexpected weight change.  HENT: Negative for congestion, dental problem, ear pain, nosebleeds, postnasal drip, rhinorrhea, sinus pressure, sneezing, sore throat and trouble swallowing.   Eyes: Negative for redness and itching.  Respiratory: Positive for shortness of breath. Negative for cough, chest tightness and wheezing.   Cardiovascular: Positive for chest pain. Negative for palpitations and leg swelling.  Gastrointestinal: Negative for nausea and vomiting.  Genitourinary: Negative for dysuria.  Musculoskeletal: Negative for joint swelling.  Skin: Negative for rash.  Neurological: Negative for headaches.  Hematological: Does not bruise/bleed easily.  Psychiatric/Behavioral: Negative for dysphoric mood. The patient is not nervous/anxious.        Objective:   Physical Exam   Filed Vitals:   06/10/14 1616  BP: 100/54  Pulse: 90  Height: 5' 8" (1.727 m)  Weight: 109 lb 6.4 oz (49.624 kg)  SpO2: 92%   GEN: A/Ox3; pleasant , NAD, frail and elderly , cachexic   HEENT:  [place]/AT,  EACs-clear, TMs-wnl, NOSE-clear, THROAT-clear, no lesions, no postnasal drip or exudate noted.   NECK:  Supple  w/ fair ROM; no JVD; normal carotid impulses w/o bruits; no thyromegaly or nodules palpated; no lymphadenopathy.  RESP  Decreased BS in bases no accessory muscle use, no dullness to percussion. NEW WHEEZE +  CARD:  RRR, no m/r/g  , no peripheral edema, pulses intact, no cyanosis or clubbing.  GI:   Soft & nt; nml bowel sounds; no organomegaly or masses detected.  Musco: Warm bil, no deformities or joint swelling noted.   Neuro: alert, no focal deficits noted.    Skin: Warm, no lesions or rashes           Assessment & Plan:     ICD-9-CM  ICD-10-CM   1. Lung mass 786.6 R91.8 CT Biopsy     CBC     INR/PT     PTT  2. COPD exacerbation 491.21 J44.1   3. Chronic obstructive pulmonary disease, unspecified COPD, unspecified chronic bronchitis type 496 J44.9   4. Smoking 305.1 Z72.0   5. Social isolation V62.4 Z60.4    Lung mass: emphasized the life-threatening nature of lung cancer. Emphasized that even if there is a scheduld issue that he cannot get up in the morning and go for biopsy that he should try to do it. His son is supportive of driving him to the biopsy in the morning. Patient says he will try. Will reschedule biopsy with IR  COPD exacerbation: Because of the presence of wheezing will treat him with prednisone for COPD exacerbation  For his COPD: We'll refill his Spiriva and Advair and have emphasized compliance  Smoking: I again emphasized that he should quit. Son wants to give him nicotine patch. Patient wants to deal with quitting smoking after the lung biopsy  Social isolation: This appears improved with the son being more involved in his life    Dr. Brand Males, M.D., [facility].C.P Pulmonary and Critical Care Medicine Staff Physician [facility] Pager: [phone], If no answer or between  15:00h - 7:00h: call [phone]  06/10/2014 4:48 PM

## 2014-06-19 ENCOUNTER — Ambulatory Visit (HOSPITAL_COMMUNITY): Payer: Medicare Other

## 2014-06-25 ENCOUNTER — Ambulatory Visit (HOSPITAL_COMMUNITY): Admission: RE | Admit: 2014-06-25 | Payer: Medicare Other | Source: Ambulatory Visit

## 2014-06-26 ENCOUNTER — Telehealth: Payer: Self-pay | Admitting: Internal Medicine

## 2014-06-26 DIAGNOSIS — R918 Other nonspecific abnormal finding of lung field: Secondary | ICD-10-CM

## 2014-06-26 NOTE — Telephone Encounter (Signed)
lmtcb for [place] with scheduling at [facility] [phone]).

## 2014-06-26 NOTE — Telephone Encounter (Signed)
[name]  He still has not had biopsy. Can you call IR please and see if they can do bx in the afternoon and explain rationale that he cannot do bx in morning because of atlered circadian clock. If they give you hassle, let me know. Let m eknow regardless  Thanks  Dr. Brand Males, M.D., [facility].C.P Pulmonary and Critical Care Medicine Staff Physician [place] [facility] Pager: [phone], If no answer or between  15:00h - 7:00h: call [phone]  06/26/2014 2:01 AM

## 2014-06-26 NOTE — Telephone Encounter (Signed)
[name] returned call  [phone]

## 2014-06-26 NOTE — Telephone Encounter (Signed)
Spoke with Elmo Putt, very rude, states that this has already been scheduled, cancelled and rescheduled x 3 times. I advised her that this is being asked to be rescheduled per Dr Chase Caller and she stated again that the patient has already cancelled 3 times. I told her that I am aware of the cancellations but per Dr Golden Pop orders, this needs to be rescheduled and we will make sure the patient is aware of importance to not cancel again. Elmo Putt then stated that they only do them in the AM and do not do them in the afternoon at [facility] d/t Dr Milta Deiters not being available. I asked if this were possible to have done at [facility] in the PM and she stated that it was NOT possible.   Please advise Dr Chase Caller. Thanks.

## 2014-06-27 NOTE — Telephone Encounter (Signed)
Yes please go to ER 06/27/2014 for sure. WIll need admission. IF ER refuses admit have patient/son say pccm recommended admit. We can use admission to establish goals of care and bx.

## 2014-06-27 NOTE — Telephone Encounter (Signed)
[phone] pt cb

## 2014-06-27 NOTE — Telephone Encounter (Signed)
Pt returned call,  [phone]

## 2014-06-27 NOTE — Telephone Encounter (Signed)
Ok I spoke to Dr Jacqulynn Cadet of IR and he confirmed that they do not do biopsies in afternoon due to risk for bleeding and ptx that need monitoring. However it appears patient can only do procedure in PM [place], I told Dr Laurence Ferrari that for IR to do procedure in PM and post procedure admit under PCCM pager [phone] as observation status and PCcM ccan do dc next morning. HE agreed tot his plan.   So , triage please re-order and under instructions put " Dr Chase Caller d/w  Dr Laurence Ferrari that for IR to do procedure in PM and post procedure admit under PCCM.Ir should page -  pager [phone]  Of PCCM and PCCM to admit as observation status and PCcM ccan do dc next morning."  Please let me know A) date of new appt with IR B) tell patient about this plan and have him agree to this C)

## 2014-06-27 NOTE — Telephone Encounter (Signed)
Spoke with Vivien Rota at [facility]. She states that she contacted the pt about his biopsy. Pt kept saying that he is not feeling well and just wants to be admitted to the hospital. He is coughing up blood and told Vivien Rota, " I'm just sitting here in my chair waiting to die."  MR - please advise.

## 2014-06-27 NOTE — Telephone Encounter (Signed)
Order has been placed per MR. lmtcb x1 for pt to call back.

## 2014-06-27 NOTE — Telephone Encounter (Signed)
Call transferred from [facility]: Spoke with patient who reports: [place] Tuesday afternoon "bad in the bathroom" - called son b/c he couldn't get up.  Very sore from this, in pain Hemoptysis w/ BRB pt reports "a couple times a day" Increased SOB and tightess in chest when he "barely breathes or cough" Lives alone, difficulty sleeping Feels as though he's "fading away"  Advised pt that with his current symptoms and feeling as bad as he does, that he should seek emergency attention and go to the ER.  Pt stated that he would like to be admitted by a physician - advised pt that d/t the severity of his symptoms, would be best to go to the ED.  Did go ahead and schedule to see MR on 5/16 @ 4:15pm in case he is discharged prior to this date.  Pt okay with the appt date/time.  Nothing further needed at this time; will sign off.

## 2014-06-30 ENCOUNTER — Encounter: Payer: Self-pay | Admitting: Internal Medicine

## 2014-06-30 ENCOUNTER — Inpatient Hospital Stay (HOSPITAL_COMMUNITY): Payer: Medicare Other

## 2014-06-30 ENCOUNTER — Telehealth: Payer: Self-pay | Admitting: Neurology

## 2014-06-30 ENCOUNTER — Inpatient Hospital Stay (HOSPITAL_COMMUNITY)
Admission: AD | Admit: 2014-06-30 | Discharge: 2014-07-04 | DRG: 190 | Disposition: A | Discharge status: Home / Self Care | Payer: Medicare Other | Source: Ambulatory Visit | Attending: Internal Medicine | Admitting: Internal Medicine

## 2014-06-30 ENCOUNTER — Ambulatory Visit (INDEPENDENT_AMBULATORY_CARE_PROVIDER_SITE_OTHER): Payer: Medicare Other | Admitting: Internal Medicine

## 2014-06-30 VITALS — BP 122/64 | HR 96 | Ht 68.0 in | Wt 102.0 lb

## 2014-06-30 DIAGNOSIS — E43 Unspecified severe protein-calorie malnutrition: Secondary | ICD-10-CM | POA: Diagnosis present

## 2014-06-30 DIAGNOSIS — Z681 Body mass index (BMI) 19 or less, adult: Secondary | ICD-10-CM | POA: Diagnosis not present

## 2014-06-30 DIAGNOSIS — M810 Age-related osteoporosis without current pathological fracture: Secondary | ICD-10-CM | POA: Diagnosis present

## 2014-06-30 DIAGNOSIS — Z96649 Presence of unspecified artificial hip joint: Secondary | ICD-10-CM | POA: Diagnosis present

## 2014-06-30 DIAGNOSIS — M545 Low back pain: Secondary | ICD-10-CM | POA: Diagnosis present

## 2014-06-30 DIAGNOSIS — Z8701 Personal history of pneumonia (recurrent): Secondary | ICD-10-CM

## 2014-06-30 DIAGNOSIS — G629 Polyneuropathy, unspecified: Secondary | ICD-10-CM | POA: Diagnosis present

## 2014-06-30 DIAGNOSIS — R197 Diarrhea, unspecified: Secondary | ICD-10-CM

## 2014-06-30 DIAGNOSIS — R918 Other nonspecific abnormal finding of lung field: Secondary | ICD-10-CM | POA: Diagnosis present

## 2014-06-30 DIAGNOSIS — R630 Anorexia: Secondary | ICD-10-CM

## 2014-06-30 DIAGNOSIS — Z604 Social exclusion and rejection: Secondary | ICD-10-CM

## 2014-06-30 DIAGNOSIS — R042 Hemoptysis: Secondary | ICD-10-CM | POA: Diagnosis present

## 2014-06-30 DIAGNOSIS — R52 Pain, unspecified: Secondary | ICD-10-CM | POA: Diagnosis present

## 2014-06-30 DIAGNOSIS — G63 Polyneuropathy in diseases classified elsewhere: Secondary | ICD-10-CM

## 2014-06-30 DIAGNOSIS — F419 Anxiety disorder, unspecified: Secondary | ICD-10-CM | POA: Diagnosis present

## 2014-06-30 DIAGNOSIS — G4700 Insomnia, unspecified: Secondary | ICD-10-CM

## 2014-06-30 DIAGNOSIS — M47812 Spondylosis without myelopathy or radiculopathy, cervical region: Secondary | ICD-10-CM

## 2014-06-30 DIAGNOSIS — Z853 Personal history of malignant neoplasm of breast: Secondary | ICD-10-CM

## 2014-06-30 DIAGNOSIS — E785 Hyperlipidemia, unspecified: Secondary | ICD-10-CM | POA: Diagnosis present

## 2014-06-30 DIAGNOSIS — F17200 Nicotine dependence, unspecified, uncomplicated: Secondary | ICD-10-CM

## 2014-06-30 DIAGNOSIS — Z8546 Personal history of malignant neoplasm of prostate: Secondary | ICD-10-CM

## 2014-06-30 DIAGNOSIS — G8929 Other chronic pain: Secondary | ICD-10-CM | POA: Diagnosis present

## 2014-06-30 DIAGNOSIS — K611 Rectal abscess: Secondary | ICD-10-CM

## 2014-06-30 DIAGNOSIS — J189 Pneumonia, unspecified organism: Secondary | ICD-10-CM

## 2014-06-30 DIAGNOSIS — Z515 Encounter for palliative care: Secondary | ICD-10-CM | POA: Diagnosis not present

## 2014-06-30 DIAGNOSIS — J441 Chronic obstructive pulmonary disease with (acute) exacerbation: Secondary | ICD-10-CM

## 2014-06-30 DIAGNOSIS — Z86010 Personal history of colonic polyps: Secondary | ICD-10-CM | POA: Diagnosis not present

## 2014-06-30 DIAGNOSIS — F17210 Nicotine dependence, cigarettes, uncomplicated: Secondary | ICD-10-CM | POA: Diagnosis present

## 2014-06-30 DIAGNOSIS — K219 Gastro-esophageal reflux disease without esophagitis: Secondary | ICD-10-CM | POA: Diagnosis present

## 2014-06-30 DIAGNOSIS — E860 Dehydration: Secondary | ICD-10-CM | POA: Diagnosis present

## 2014-06-30 DIAGNOSIS — F329 Major depressive disorder, single episode, unspecified: Secondary | ICD-10-CM | POA: Diagnosis present

## 2014-06-30 DIAGNOSIS — R634 Abnormal weight loss: Secondary | ICD-10-CM

## 2014-06-30 DIAGNOSIS — D649 Anemia, unspecified: Secondary | ICD-10-CM | POA: Diagnosis not present

## 2014-06-30 DIAGNOSIS — M1990 Unspecified osteoarthritis, unspecified site: Secondary | ICD-10-CM | POA: Diagnosis present

## 2014-06-30 DIAGNOSIS — I10 Essential (primary) hypertension: Secondary | ICD-10-CM | POA: Diagnosis present

## 2014-06-30 DIAGNOSIS — Z888 Allergy status to other drugs, medicaments and biological substances status: Secondary | ICD-10-CM | POA: Diagnosis not present

## 2014-06-30 DIAGNOSIS — L98499 Non-pressure chronic ulcer of skin of other sites with unspecified severity: Secondary | ICD-10-CM

## 2014-06-30 DIAGNOSIS — N400 Enlarged prostate without lower urinary tract symptoms: Secondary | ICD-10-CM | POA: Diagnosis present

## 2014-06-30 DIAGNOSIS — K8689 Other specified diseases of pancreas: Secondary | ICD-10-CM

## 2014-06-30 DIAGNOSIS — R1010 Upper abdominal pain, unspecified: Secondary | ICD-10-CM

## 2014-06-30 DIAGNOSIS — Z9049 Acquired absence of other specified parts of digestive tract: Secondary | ICD-10-CM | POA: Diagnosis present

## 2014-06-30 DIAGNOSIS — I7092 Chronic total occlusion of artery of the extremities: Secondary | ICD-10-CM

## 2014-06-30 DIAGNOSIS — F32A Depression, unspecified: Secondary | ICD-10-CM

## 2014-06-30 DIAGNOSIS — M79661 Pain in right lower leg: Secondary | ICD-10-CM

## 2014-06-30 DIAGNOSIS — Z85828 Personal history of other malignant neoplasm of skin: Secondary | ICD-10-CM

## 2014-06-30 DIAGNOSIS — D519 Vitamin B12 deficiency anemia, unspecified: Secondary | ICD-10-CM

## 2014-06-30 DIAGNOSIS — D638 Anemia in other chronic diseases classified elsewhere: Secondary | ICD-10-CM | POA: Diagnosis present

## 2014-06-30 DIAGNOSIS — I739 Peripheral vascular disease, unspecified: Secondary | ICD-10-CM | POA: Diagnosis present

## 2014-06-30 DIAGNOSIS — R059 Cough, unspecified: Secondary | ICD-10-CM

## 2014-06-30 DIAGNOSIS — D696 Thrombocytopenia, unspecified: Secondary | ICD-10-CM

## 2014-06-30 DIAGNOSIS — I70209 Unspecified atherosclerosis of native arteries of extremities, unspecified extremity: Secondary | ICD-10-CM

## 2014-06-30 DIAGNOSIS — R05 Cough: Secondary | ICD-10-CM

## 2014-06-30 DIAGNOSIS — R0789 Other chest pain: Secondary | ICD-10-CM

## 2014-06-30 DIAGNOSIS — M47817 Spondylosis without myelopathy or radiculopathy, lumbosacral region: Secondary | ICD-10-CM

## 2014-06-30 DIAGNOSIS — Z9889 Other specified postprocedural states: Secondary | ICD-10-CM

## 2014-06-30 DIAGNOSIS — M069 Rheumatoid arthritis, unspecified: Secondary | ICD-10-CM

## 2014-06-30 DIAGNOSIS — R079 Chest pain, unspecified: Secondary | ICD-10-CM

## 2014-06-30 DIAGNOSIS — R64 Cachexia: Secondary | ICD-10-CM

## 2014-06-30 DIAGNOSIS — M549 Dorsalgia, unspecified: Secondary | ICD-10-CM

## 2014-06-30 DIAGNOSIS — K269 Duodenal ulcer, unspecified as acute or chronic, without hemorrhage or perforation: Secondary | ICD-10-CM

## 2014-06-30 LAB — CBC WITH DIFFERENTIAL/PLATELET
BASOS PCT: 0 % (ref 0–1)
Basophils Absolute: 0 10*3/uL (ref 0.0–0.1)
Eosinophils Absolute: 0 10*3/uL (ref 0.0–0.7)
Eosinophils Relative: 0 % (ref 0–5)
HEMATOCRIT: 32.2 % — AB (ref 39.0–52.0)
HEMOGLOBIN: 10.1 g/dL — AB (ref 13.0–17.0)
LYMPHS ABS: 0.7 10*3/uL (ref 0.7–4.0)
LYMPHS PCT: 12 % (ref 12–46)
MCH: 29.3 pg (ref 26.0–34.0)
MCHC: 31.4 g/dL (ref 30.0–36.0)
MCV: 93.3 fL (ref 78.0–100.0)
Monocytes Absolute: 0.5 10*3/uL (ref 0.1–1.0)
Monocytes Relative: 8 % (ref 3–12)
NEUTROS ABS: 4.7 10*3/uL (ref 1.7–7.7)
NEUTROS PCT: 80 % — AB (ref 43–77)
Platelets: 191 10*3/uL (ref 150–400)
RBC: 3.45 MIL/uL — AB (ref 4.22–5.81)
RDW: 15.6 % — ABNORMAL HIGH (ref 11.5–15.5)
WBC: 5.9 10*3/uL (ref 4.0–10.5)

## 2014-06-30 LAB — MAGNESIUM: Magnesium: 1.9 mg/dL (ref 1.7–2.4)

## 2014-06-30 LAB — COMPREHENSIVE METABOLIC PANEL
ALK PHOS: 84 U/L (ref 38–126)
ALT: 9 U/L — AB (ref 17–63)
AST: 15 U/L (ref 15–41)
Albumin: 2.5 g/dL — ABNORMAL LOW (ref 3.5–5.0)
Anion gap: 10 (ref 5–15)
BUN: 10 mg/dL (ref 6–20)
CALCIUM: 8.6 mg/dL — AB (ref 8.9–10.3)
CO2: 28 mmol/L (ref 22–32)
Chloride: 99 mmol/L — ABNORMAL LOW (ref 101–111)
Creatinine, Ser: 1.04 mg/dL (ref 0.61–1.24)
GFR calc Af Amer: >60 mL/min (ref >60)
GFR calc non Af Amer: >60 mL/min (ref >60)
Glucose, Bld: 106 mg/dL — ABNORMAL HIGH (ref 65–99)
POTASSIUM: 4.2 mmol/L (ref 3.5–5.1)
Sodium: 137 mmol/L (ref 135–145)
TOTAL PROTEIN: 6.2 g/dL — AB (ref 6.5–8.1)
Total Bilirubin: 0.4 mg/dL (ref 0.3–1.2)

## 2014-06-30 LAB — LACTIC ACID, PLASMA: Lactic Acid, Venous: 1.1 mmol/L (ref 0.5–2.0)

## 2014-06-30 LAB — LIPASE, BLOOD: Lipase: 14 U/L — ABNORMAL LOW (ref 22–51)

## 2014-06-30 LAB — TROPONIN I: Troponin I: <0.03 ng/mL (ref <0.031)

## 2014-06-30 LAB — AMYLASE: Amylase: 29 U/L (ref 28–100)

## 2014-06-30 LAB — PROTIME-INR
INR: 1.11 (ref 0.00–1.49)
Prothrombin Time: 14.5 seconds (ref 11.6–15.2)

## 2014-06-30 LAB — PHOSPHORUS: Phosphorus: 3.4 mg/dL (ref 2.5–4.6)

## 2014-06-30 MED ORDER — METHYLPREDNISOLONE SODIUM SUCC 40 MG IJ SOLR
40.0000 mg | Freq: Two times a day (BID) | INTRAMUSCULAR | Status: DC
  Administered 2014-06-30 – 2014-07-02 (×4): 40 mg via INTRAVENOUS
  Filled 2014-06-30 (×6): qty 1

## 2014-06-30 MED ORDER — DEXTROSE-NACL 5-0.45 % IV SOLN
INTRAVENOUS | Status: DC
  Administered 2014-06-30: 19:00:00 via INTRAVENOUS

## 2014-06-30 MED ORDER — ALPRAZOLAM 0.5 MG PO TABS
1.5000 mg | ORAL_TABLET | Freq: Two times a day (BID) | ORAL | Status: DC | PRN
  Administered 2014-07-02: 1.5 mg via ORAL
  Filled 2014-06-30: qty 3

## 2014-06-30 MED ORDER — OXYCODONE HCL 5 MG PO TABS
30.0000 mg | ORAL_TABLET | Freq: Four times a day (QID) | ORAL | Status: DC | PRN
  Administered 2014-06-30 – 2014-07-01 (×2): 30 mg via ORAL
  Filled 2014-06-30 (×2): qty 6

## 2014-06-30 MED ORDER — SERTRALINE HCL 50 MG PO TABS
75.0000 mg | ORAL_TABLET | Freq: Every day | ORAL | Status: DC
  Administered 2014-06-30 – 2014-07-02 (×3): 75 mg via ORAL
  Filled 2014-06-30 (×4): qty 1

## 2014-06-30 MED ORDER — IPRATROPIUM-ALBUTEROL 0.5-2.5 (3) MG/3ML IN SOLN
3.0000 mL | RESPIRATORY_TRACT | Status: DC
  Administered 2014-06-30: 3 mL via RESPIRATORY_TRACT
  Filled 2014-06-30 (×2): qty 3

## 2014-06-30 MED ORDER — GABAPENTIN 300 MG PO CAPS
300.0000 mg | ORAL_CAPSULE | Freq: Three times a day (TID) | ORAL | Status: DC
  Administered 2014-06-30 – 2014-07-04 (×11): 300 mg via ORAL
  Filled 2014-06-30 (×13): qty 1

## 2014-06-30 MED ORDER — PANTOPRAZOLE SODIUM 40 MG PO TBEC
40.0000 mg | DELAYED_RELEASE_TABLET | Freq: Every day | ORAL | Status: DC
  Administered 2014-06-30 – 2014-07-04 (×5): 40 mg via ORAL
  Filled 2014-06-30 (×6): qty 1

## 2014-06-30 MED ORDER — DOXYCYCLINE HYCLATE 100 MG IV SOLR
100.0000 mg | Freq: Two times a day (BID) | INTRAVENOUS | Status: DC
  Administered 2014-06-30 – 2014-07-02 (×4): 100 mg via INTRAVENOUS
  Filled 2014-06-30 (×5): qty 100

## 2014-06-30 MED ORDER — ALBUTEROL SULFATE (2.5 MG/3ML) 0.083% IN NEBU: 2.5000 mg | INHALATION_SOLUTION | RESPIRATORY_TRACT | Status: AC | PRN

## 2014-06-30 MED ORDER — SODIUM CHLORIDE 0.9 % IV SOLN: 250.0000 mL | INTRAVENOUS | Status: DC | PRN

## 2014-06-30 MED ORDER — ROSUVASTATIN CALCIUM 20 MG PO TABS
20.0000 mg | ORAL_TABLET | Freq: Every day | ORAL | Status: DC
  Administered 2014-06-30 – 2014-07-03 (×4): 20 mg via ORAL
  Filled 2014-06-30 (×5): qty 1

## 2014-06-30 NOTE — Progress Notes (Signed)
   Subjective:    Patient ID: [name], male    DOB: 25-Apr-1934, 80 y.o.   MRN: [id number]  HPI  xxx  Review of Systems  Constitutional: Negative for fever and unexpected weight change.  HENT: Negative for congestion, dental problem, ear pain, nosebleeds, postnasal drip, rhinorrhea, sinus pressure, sneezing, sore throat and trouble swallowing.   Eyes: Negative for redness and itching.  Respiratory: Positive for cough and shortness of breath. Negative for chest tightness and wheezing.   Cardiovascular: Positive for chest pain. Negative for palpitations and leg swelling.  Gastrointestinal: Negative for nausea and vomiting.  Genitourinary: Negative for dysuria.  Musculoskeletal: Negative for joint swelling.  Skin: Negative for rash.  Neurological: Negative for headaches.  Hematological: Does not bruise/bleed easily.  Psychiatric/Behavioral: Negative for dysphoric mood. The patient is not nervous/anxious.        Objective:   Physical Exam  Filed Vitals:   06/30/14 1645  BP: 122/64  Pulse: 96  Height: '5\' 8"'$  (1.727 m)  Weight: 102 lb (46.267 kg)  SpO2: 95%         Assessment & Plan:  Direct admit. See inpatient noe

## 2014-06-30 NOTE — Telephone Encounter (Signed)
Patient called requesting a refill for oxycodone (ROXICODONE) 30 MG immediate release tablet. Patient will be in the area today and would to pick it up today if possible. Patient can be reached @ [phone]

## 2014-06-30 NOTE — Telephone Encounter (Signed)
I spoke with patient who said he is not feeling well today, so he will not be going out.  He would prefer to get Rx tomorrow instead.  Rx was last written on 04/18.

## 2014-06-30 NOTE — Patient Instructions (Signed)
ICD-9-CM ICD-10-CM   1. COPD exacerbation 491.21 J44.1   2. Hemoptysis 786.30 R04.2   3. Lung mass 786.6 R91.8     Go to [facility] Admissions office for admission to 6E09 - phone number [phone]

## 2014-06-30 NOTE — Progress Notes (Signed)
eLink Physician-Brief Progress Note Patient Name: [name] DOB: 07/01/1934 MRN: [id number]   Date of Service  06/30/2014  HPI/Events of Note  Patient requests diet. To be NPO post midnight for lung biopsy in AM.   eICU Interventions  Will order Regular diet until midnight.     Intervention Category Minor Interventions: Routine modifications to care plan (e.g. PRN medications for pain, fever)  [name] 06/30/2014, 7:52 PM

## 2014-06-30 NOTE — H&P (Addendum)
PULMONARY / CRITICAL CARE MEDICINE   Name: [name] MRN: [id number] DOB: [birth year]    ADMISSION DATE:  06/30/14  CHIEF COMPLAINT:  hemoptysis   HISTORY OF PRESENT ILLNESS:    #COPD - pirometry 06/25/12" fev1 Pos BD fev1 1.17L/42% (+5% bd response), FVC 3.4L/88%, R 34, DLCO 36% and is consistent with Gold stage III COPD. On spiriva and symbicort  #CAchexia/ sever malnutrition - Body mass index is 17.38 kg/(m^2). 03/13/2014 - Body mass index is 16.64 kg/(m^2). 06/10/2014  #Chronic pain  #Lung mass LUL  #SMoiker  reports that he has been smoking Cigarettes. He has a 31.5 pack-year smoking history. He has never used smokeless tobacco.  #Social isolatrion  - recently moved in with son         ADMIT 06/30/2014 In February 2016 he was diagnosed with a large left upper lobe lung mass. This was PET hot on PET scan. He also had a mediastinal node. He was set up for lung biopsy CT-guided by my nurse practitioner. But he did not show up for the lung biopsy. Then on 06/09/2014 he was involved in a Most recentlyin fact yesterday was involved in a minor trauma and he had a chest x-ray that showed worsening left upper lobe mass [personally reviewed and confirmed the image]. He followed up with me 06/10/2014 and at that point still had not had the biopsy. I learned from them that he is basically a nocturnist and goes to bed early in the morning therefore he is unable to make it in the morning for biopsies. He preferred afternoon biopsies by interventional radiology and he stated that they would not accommodate his request. At that visit 06/10/14  I had extensive counseling with him and son and warned of danger of life threatening cancer  and he agreed to have a morning biopsy. Now, It turns out that despite multiple attempts he could not make it to morning biopsies and his biopsy never got scheduled ordered was canceled on multiple occasions. I subsequently intervened and discussed with Dr.  Jacqulynn Cadet and we agreed to have the biopsy done in the afternoon with patient subsequently being admitted under pulmonary service for postbiopsy observation. While we were trying to get this plan patient called in acutely 2 days ago before the weekend complaining of acute worsening of shortness of breath cough wheezing. I told him to go to the emergency department but he did not. Presents to the office today with her son. He has persistent minor  hemoptysis since last seeing me. No clear cut aggravating relieving factors. So far not worse. insidiious oinset. It appeared that he might have some COPD exacerbation but this really is not an issue and seems to be clearing/stable/resolving. The main thing is hemoptysis.  In addition he complains of depression, chronic neuropathic pain, poor quality of life. He is opening to having a frank discussion about goals of care. He is okay with hospice admission if his prognosis as bad but he would like to know his diagnosis of the lung mass.    PAST MEDICAL HISTORY :   has a past medical history of History of colon polyps; Spinal stenosis; Anxiety; BPH (benign prostatic hypertrophy); ED (erectile dysfunction); Osteoporosis; Hypertension; Chest pain; Peripheral vascular disease; Peripheral neuropathy; COPD (chronic obstructive pulmonary disease); Emphysema; BRBPR (bright red blood per rectum); Chronic lower back pain; Pernicious anemia; GERD (gastroesophageal reflux disease); Polyneuropathy in other diseases classified elsewhere (11/06/2012); Prostate cancer (2004); Breast cancer in male; Basal cell carcinoma of nasal  tip; Pneumonia (1960's); Cervical spondylosis; Rheumatoid arthritis(714.0); Arthritis; DDD (degenerative disc disease), lumbosacral; Lumbosacral spondylosis without myelopathy (11/06/2012); Cellulitis of right leg; Depression; Falls frequently; Severe malnutrition (11/27/2013); and Hyperlipidemia.  has past surgical history that includes Excision basal  cell carcinoma; Cholecystectomy (?1990's); Wrist fusion (Right); Excisional hemorrhoidectomy (1960's); Knee surgery (Right); Iliac artery stent (12-07-09); Total hip arthroplasty (08/16/2011); Cervical disc surgery; Esophagogastroduodenoscopy (02/22/2012); EUS (02/22/2012); Hernia repair; Transurethral resection of prostate; Aortogram (04/25/2013); Esophagogastroduodenoscopy (Left, 04/27/2013); Femoral-femoral Bypass Graft (Bilateral, 05/01/2013); and abdominal aortagram (N/A, 04/25/2013). Prior to Admission medications   Medication Sig Start Date End Date Taking? Authorizing Provider  albuterol (PROVENTIL) (2.5 MG/3ML) 0.083% nebulizer solution Take 3 mLs (2.5 mg total) by nebulization every 4 (four) hours as needed for wheezing or shortness of breath. 06/30/14   Brand Males, MD  ALPRAZolam Duanne Moron) 1 MG tablet Take 1 and 1/2 tablet by mouth up to twice daily as needed Patient taking differently: Take 1.5 mg by mouth 2 (two) times daily as needed for anxiety.  12/30/13   Hennie Duos, MD  Fluticasone-Salmeterol (ADVAIR) 250-50 MCG/DOSE AEPB Inhale 1 puff into the lungs every 12 (twelve) hours. 04/11/14   Tammy S Parrett, NP  gabapentin (NEURONTIN) 300 MG capsule Take 1 capsule (300 mg total) by mouth 3 (three) times daily. 06/05/14   Kathrynn Ducking, MD  oxycodone (ROXICODONE) 30 MG immediate release tablet Take 1 tablet (30 mg total) by mouth every 3 (three) hours as needed for pain (Must last 28 days). 06/02/14   Britt Bottom, MD  rosuvastatin (CRESTOR) 20 MG tablet Take 20 mg by mouth at bedtime.     Historical Provider, MD  sertraline (ZOLOFT) 50 MG tablet Take 1.5 tablets (75 mg total) by mouth at bedtime. 03/13/14   Brand Males, MD   Allergies  Allergen Reactions  . Lipitor [Atorvastatin Calcium] Other (See Comments)    Myalgias "don't remember how bad" (01/31/2012)    No current facility-administered medications for this encounter.  Current outpatient prescriptions:  .  albuterol  (PROVENTIL) (2.5 MG/3ML) 0.083% nebulizer solution, Take 3 mLs (2.5 mg total) by nebulization every 4 (four) hours as needed for wheezing or shortness of breath., Disp: 75 mL, Rfl: 5 .  ALPRAZolam (XANAX) 1 MG tablet, Take 1 and 1/2 tablet by mouth up to twice daily as needed (Patient taking differently: Take 1.5 mg by mouth 2 (two) times daily as needed for anxiety. ), Disp: 60 tablet, Rfl: 5 .  Fluticasone-Salmeterol (ADVAIR) 250-50 MCG/DOSE AEPB, Inhale 1 puff into the lungs every 12 (twelve) hours., Disp: 1 each, Rfl: 0 .  gabapentin (NEURONTIN) 300 MG capsule, Take 1 capsule (300 mg total) by mouth 3 (three) times daily., Disp: 90 capsule, Rfl: 5 .  oxycodone (ROXICODONE) 30 MG immediate release tablet, Take 1 tablet (30 mg total) by mouth every 3 (three) hours as needed for pain (Must last 28 days)., Disp: 200 tablet, Rfl: 0 .  rosuvastatin (CRESTOR) 20 MG tablet, Take 20 mg by mouth at bedtime. , Disp: , Rfl:  .  sertraline (ZOLOFT) 50 MG tablet, Take 1.5 tablets (75 mg total) by mouth at bedtime., Disp: 30 tablet, Rfl: 5   FAMILY HISTORY:  indicated that his mother is deceased. He indicated that his father is deceased. He indicated that both of his sisters are alive. He indicated that his brother is alive.  SOCIAL HISTORY:  reports that he has been smoking Cigarettes.  He has a 31.5 pack-year smoking history. He has never used smokeless  tobacco. He reports that he does not drink alcohol or use illicit drugs.  REVIEW OF SYSTEMS:   Constitutional: Negative for fever and unexpected weight change.  HENT: Negative for congestion, dental problem, ear pain, nosebleeds, postnasal drip, rhinorrhea, sinus pressure, sneezing, sore throat and trouble swallowing.  Eyes: Negative for redness and itching.  Respiratory: Positive for cough and shortness of breath. Negative for chest tightness and wheezing.  Cardiovascular: Positive for chest pain. Negative for palpitations and leg swelling.   Gastrointestinal: Negative for nausea and vomiting.  Genitourinary: Negative for dysuria.  Musculoskeletal: Negative for joint swelling.  Skin: Negative for rash.  Neurological: Negative for headaches.  Hematological: Does not bruise/bleed easily.  Psychiatric/Behavioral: Negative for dysphoric mood. The patient is not nervous/anxious.    SUBJECTIVE:   VITAL SIGNS: Pulse Rate:  [96] 96 (05/16 1645) BP: (122)/(64) 122/64 mmHg (05/16 1645) SpO2:  [95 %] 95 % (05/16 1645) Weight:  [102 lb (46.267 kg)] 102 lb (46.267 kg) (05/16 1645) HEMODYNAMICS:   VENTILATOR SETTINGS:   INTAKE / OUTPUT: No intake or output data in the 24 hours ending 06/30/14 1727  PHYSICAL EXAMINATION: General:  Cachectic deconditioned male sitting in the office looks uncomfortable Neuro:  Alert and oriented 3,. Speech normal HEENT:  Neck is supple no neck nodes Cardiovascular:  Normal heart sounds regular rate and rhythm Lungs:  Clear to auscultation bilaterally, barrel chest, no pursed lip breathing, no accessory muscle use, normal work of breathing Abdomen:  Scaphoid, soft no mass Musculoskeletal:  No cyanosis no clubbing no edema but right lower extremity is placed on a chair Skin:  Intact in exposed areas  LABS: On 06/10/2014: His hemoglobin was 7.1 g percent and mildly anemic, INR was 1.1  CBC No results for input(s): WBC, HGB, HCT, PLT in the last 168 hours. Coag's No results for input(s): APTT, INR in the last 168 hours. BMET No results for input(s): NA, K, CL, CO2, BUN, CREATININE, GLUCOSE in the last 168 hours. Electrolytes No results for input(s): CALCIUM, MG, PHOS in the last 168 hours. Sepsis Markers No results for input(s): LATICACIDVEN, PROCALCITON, O2SATVEN in the last 168 hours. ABG No results for input(s): PHART, PCO2ART, PO2ART in the last 168 hours. Liver Enzymes No results for input(s): AST, ALT, ALKPHOS, BILITOT, ALBUMIN in the last 168 hours. Cardiac Enzymes No results for  input(s): TROPONINI, PROBNP in the last 168 hours. Glucose No results for input(s): GLUCAP in the last 168 hours.  Imaging No results found. Chest x-ray today 06/30/2014: Shows enlarging left upper lobe mass-personally reviewed  ASSESSMENT / PLAN:  PULMONARY  A: #Baseline  - COPD #Current  - Left upper lobe mass-in need of biopsy - Hemoptysis-probably related to mass - new since last seen 05/21/14. AT risk for severe transformation - Possible COPD exacerbation  P:   Order CT guided transthoracic needle biopsy - risks of pneumothorax, pneumothorax, non-diagnosis and sedation complications explained. He verbalized and agreed to proceed Monitor volume of hemoptysis: If large will need IR guided embolization COPD exacerbation: Steroids, antibiotics and bronchodilators  CARDIOVASCULAR  A: Nil acute but has hyperlipedmia P:  Monitor statin  RENAL A:  Patient complains of dehydration P:   Check BMET mag, phos IV fluids  GASTROINTESTINAL A:  Severe protein calorie mother nutrition and cachexia P:   Nutrition consult  HEMATOLOGIC A:  At risk for anemia of chronic disease  P:  DVT prophylaxis with SCDs Monitor hemoglobin  INFECTIOUS A:  Possible COPD exacerbation P:   respiratory section  ENDOCRINE A:  Nil acute P:   Monitor  NEUROLOGIC A:  Chronic pain and depression and anxiety P:   Home pain meds - oxy , benzo and gabapentin Palliative consult  FAMILY  -Call palliative care consult for pain management, advance care planning, goals of care, CODE STATUS and prognosis    Dr. Brand Males, M.D., [facility].C.P Pulmonary and Critical Care Medicine Staff Physician [facility] Pager: [phone], If no answer or between  15:00h - 7:00h: call [phone]  06/30/2014 6:26 PM

## 2014-07-01 ENCOUNTER — Other Ambulatory Visit: Payer: Self-pay

## 2014-07-01 ENCOUNTER — Telehealth: Payer: Self-pay

## 2014-07-01 ENCOUNTER — Inpatient Hospital Stay (HOSPITAL_COMMUNITY): Payer: Medicare Other

## 2014-07-01 ENCOUNTER — Encounter (HOSPITAL_COMMUNITY): Payer: Self-pay | Admitting: Radiology

## 2014-07-01 DIAGNOSIS — D649 Anemia, unspecified: Secondary | ICD-10-CM

## 2014-07-01 LAB — TROPONIN I: Troponin I: <0.03 ng/mL (ref <0.031)

## 2014-07-01 MED ORDER — OXYCODONE HCL 30 MG PO TABS: 30.0000 mg | ORAL_TABLET | ORAL | Status: DC | PRN

## 2014-07-01 MED ORDER — OXYCODONE HCL 5 MG PO TABS
30.0000 mg | ORAL_TABLET | ORAL | Status: DC | PRN
  Administered 2014-07-01 – 2014-07-02 (×4): 30 mg via ORAL
  Filled 2014-07-01 (×5): qty 6

## 2014-07-01 MED ORDER — MAGNESIUM SULFATE IN D5W 10-5 MG/ML-% IV SOLN
1.0000 g | Freq: Once | INTRAVENOUS | Status: AC
  Administered 2014-07-01: 1 g via INTRAVENOUS
  Filled 2014-07-01: qty 100

## 2014-07-01 MED ORDER — ENSURE ENLIVE PO LIQD
237.0000 mL | Freq: Two times a day (BID) | ORAL | Status: DC
  Administered 2014-07-01 – 2014-07-04 (×6): 237 mL via ORAL

## 2014-07-01 MED ORDER — LIDOCAINE-EPINEPHRINE 1 %-1:100000 IJ SOLN
INTRAMUSCULAR | Status: AC
  Filled 2014-07-01: qty 1

## 2014-07-01 MED ORDER — MIDAZOLAM HCL 2 MG/2ML IJ SOLN
INTRAMUSCULAR | Status: AC
  Filled 2014-07-01: qty 2

## 2014-07-01 MED ORDER — FENTANYL CITRATE (PF) 100 MCG/2ML IJ SOLN
INTRAMUSCULAR | Status: AC
  Filled 2014-07-01: qty 2

## 2014-07-01 MED ORDER — MIDAZOLAM HCL 2 MG/2ML IJ SOLN
INTRAMUSCULAR | Status: AC | PRN
  Administered 2014-07-01 (×2): 0.5 mg via INTRAVENOUS

## 2014-07-01 MED ORDER — FENTANYL CITRATE (PF) 100 MCG/2ML IJ SOLN
INTRAMUSCULAR | Status: AC | PRN
  Administered 2014-07-01: 12.5 ug via INTRAVENOUS
  Administered 2014-07-01: 25 ug via INTRAVENOUS
  Administered 2014-07-01: 12.5 ug via INTRAVENOUS

## 2014-07-01 MED ORDER — OXYCODONE HCL 5 MG PO TABS: 20.0000 mg | ORAL_TABLET | Freq: Once | ORAL | Status: AC

## 2014-07-01 MED ORDER — IPRATROPIUM-ALBUTEROL 0.5-2.5 (3) MG/3ML IN SOLN
3.0000 mL | Freq: Four times a day (QID) | RESPIRATORY_TRACT | Status: DC
  Administered 2014-07-01 – 2014-07-04 (×13): 3 mL via RESPIRATORY_TRACT
  Filled 2014-07-01 (×14): qty 3

## 2014-07-01 MED ORDER — FERROUS SULFATE 325 (65 FE) MG PO TABS
325.0000 mg | ORAL_TABLET | Freq: Every day | ORAL | Status: DC
  Administered 2014-07-02 – 2014-07-04 (×3): 325 mg via ORAL
  Filled 2014-07-01 (×4): qty 1

## 2014-07-01 NOTE — Procedures (Signed)
Technically successful CT guided biopsy of left lung mass.  No immediate post procedural complications.  

## 2014-07-01 NOTE — Progress Notes (Signed)
eLink Physician-Brief Progress Note Patient Name: [name] DOB: Jan 02, 1935 MRN: [id number]   Date of Service  07/01/2014  HPI/Events of Note  L forearm IV site infiltrated while patient receiving Doxycycline. IV Catheter now removed.   eICU Interventions  Will order: 1. Elevate L forearm. 2. Ice to L forearm. 3. Watch L foream closely for loss of radial or ulnar pulses or signs of compartment syndrome.      Intervention Category Intermediate Interventions: Pain - evaluation and management  Lysle Dingwall 07/01/2014, 8:36 PM

## 2014-07-01 NOTE — Progress Notes (Signed)
Initial Nutrition Assessment  DOCUMENTATION CODES:  Severe malnutrition in context of chronic illness, Underweight   Pt meets criteria for SEVERE MALNUTRITION in the context of chronic illness as evidenced by energy intake </= 75% for >/= 1 month and severe fat and muscle mass loss.  INTERVENTION:  Ensure Enlive (each supplement provides 350kcal and 20 grams of protein), Magic cup   Monitor magnesium, potassium, and phosphorus daily for at least 3 days, MD to replete as needed, as pt is at risk for refeeding syndrome given severe malnutrition and poor po intake.  Encourage adequate PO intake.   NUTRITION DIAGNOSIS:  Malnutrition related to  (inadequate oral intake) as evidenced by severe depletion of body fat, severe depletion of muscle mass, energy intake < 75% for > or equal to 1 month.  GOAL:  Patient will meet greater than or equal to 90% of their needs  MONITOR:  PO intake, Weight trends, Labs, I & O's  REASON FOR ASSESSMENT:  Consult Poor PO  ASSESSMENT: Pt who was admitted for weakness, weight loss, hemoptysis x couple weeks and worsening left sided chest pain and shortness of breath with known LUL lung mass presents with hemoptysis.   Pt reports having a decreased appetite which has been ongoing over the past 1 year. Pt reports he has been consuming one meal a day with an occasional snack/Ensure Shake. Current meal completion is 75%. Pt reports weight loss with reported usual body weight reported to be 160 lbs 1 year ago, however per Epic weight records pt recorded to be ~130 lbs. Pt with a 20% weight loss in 1 year. Pt with severe fat and muscle mass loss. Pt is agreeable to Ensure and Magic cup. RD to order. Pt was encourage to eat his food at meals and to consume his supplements.   Labs: Low chloride, calcium, and ALT.  Height:  Ht Readings from Last 1 Encounters:  06/30/14 '5\' 8"'$  (1.727 m)    Weight:  Wt Readings from Last 1 Encounters:  06/30/14 98 lb 3.2  oz (44.543 kg)    Ideal Body Weight:  70 kg  Wt Readings from Last 10 Encounters:  06/30/14 98 lb 3.2 oz (44.543 kg)  06/30/14 102 lb (46.267 kg)  06/10/14 109 lb 6.4 oz (49.624 kg)  06/05/14 111 lb 3.2 oz (50.44 kg)  04/11/14 112 lb (50.803 kg)  04/03/14 111 lb 6.4 oz (50.531 kg)  03/21/14 108 lb 9.6 oz (49.261 kg)  03/13/14 111 lb (50.349 kg)  11/27/13 108 lb 6.4 oz (49.17 kg)  10/08/13 117 lb 6.4 oz (53.252 kg)    BMI:  Body mass index is 14.93 kg/(m^2). Underweight  Estimated Nutritional Needs:  Kcal:  1800-2000  Protein:  70-85 grams  Fluid:  1.8 - 2 L/day  Skin:   (+1 LE edema)  Diet Order:  Diet regular Room service appropriate?: Yes; Fluid consistency:: Thin  EDUCATION NEEDS:  No education needs identified at this time   Intake/Output Summary (Last 24 hours) at 07/01/14 1346 Last data filed at 07/01/14 0600  Gross per 24 hour  Intake 1018.75 ml  Output     30 ml  Net 988.75 ml    Last BM:  PTA  Kallie Locks, MS, RD, LDN Pager # [phone] After hours/ weekend pager # [phone]

## 2014-07-01 NOTE — Progress Notes (Signed)
Patient requests for pain med,states medicine is too spaced out and it's not helping.Patient takes oxycodone Q3H PRN at home.Dr. Jimmy Footman notified.Order received.Will continue to monitor. Kesley Mullens, Wonda Cheng, Therapist, sports

## 2014-07-01 NOTE — Telephone Encounter (Signed)
Rx ready for pick up. 

## 2014-07-01 NOTE — H&P (Signed)
Reason for Consult: Lung mass  Referring Physician(s): CCM  History of Present Illness: [name] is a 80 y.o. male who was admitted for weakness, weight loss, hemoptysis x couple weeks and worsening left sided chest pain and shortness of breath with known LUL lung mass. Last imaging PET 04/07/14 LUL mass was hypermetabolic and request for CT biopsy with multiple attempts, however patient did not keep appointments secondary to time inconvenience as the patient prefers to sleep in. He is now an inpatient and IR received request for consult. The patient states he has progressively been getting weaker and losing more weight and now with hemoptysis. He states while in hospital the hemoptysis has improved some and is a small amount now. He denies any other sites of active bleeding. He is a current tobacco user and smokes approximately 1ppd x 60 years. He does currently live at home by himself and denies any history of OSA or chronic oxygen use. He has previously tolerated sedation during a colonoscopy. He is also complaining of urinary hesitancy and dysuria.   Past Medical History  Diagnosis Date  . History of colon polyps   . Spinal stenosis   . Anxiety   . BPH (benign prostatic hypertrophy)   . ED (erectile dysfunction)   . Osteoporosis   . Hypertension   . Chest pain     "I've had it" (01/31/2012)   . Peripheral vascular disease     "right leg is 100% blocked; left leg has stent, still ~ 75% blocked" (01/31/2012)  . Peripheral neuropathy     "bad" (01/31/2012)  . COPD (chronic obstructive pulmonary disease)   . Emphysema   . BRBPR (bright red blood per rectum)     "don't know what it's from; had some this week" (01/31/2012)  . Chronic lower back pain   . Pernicious anemia   . GERD (gastroesophageal reflux disease)   . Polyneuropathy in other diseases classified elsewhere 11/06/2012  . Prostate cancer 2004    "had 40 treatments of radiation" (01/31/2012)  . Breast cancer in  male     "left" (01/31/2012)  . Basal cell carcinoma of nasal tip   . Pneumonia 1960's    "double"   . Cervical spondylosis   . Rheumatoid arthritis(714.0)   . Arthritis     "all over my body" (04/25/2013)  . DDD (degenerative disc disease), lumbosacral     "S1; L5" (01/31/2012)  . Lumbosacral spondylosis without myelopathy 11/06/2012  . Cellulitis of right leg   . Depression     "won't take RX though" (04/25/2013)  . Falls frequently   . Severe malnutrition 11/27/2013  . Hyperlipidemia     Past Surgical History  Procedure Laterality Date  . Basal cell carcinoma excision      Nose x 3  . Cholecystectomy  ?1990's  . Wrist fusion Right     "3 OR's; it's fused" (01/31/2012)  . Excisional hemorrhoidectomy  1960's  . Knee surgery Right      6 surgeries; went in for simple cartilage OR; ended up w/fused knee" (01/31/2012)  . Iliac artery stent  12-07-09    Stent done by Dr. Gwenlyn Found  . Total hip arthroplasty  08/16/2011    Procedure: TOTAL HIP ARTHROPLASTY ANTERIOR APPROACH;  Surgeon: Mcarthur Rossetti, MD;  Location: [place];  Service: Orthopedics;  Laterality: Right;  Right total hip replacement  . Cervical disc surgery      "7 total; ended w/a fusion" (01/31/2012)  .  Esophagogastroduodenoscopy  02/22/2012    Procedure: ESOPHAGOGASTRODUODENOSCOPY (EGD);  Surgeon: Arta Silence, MD;  Location: [facility];  Service: Endoscopy;  Laterality: N/A;  . Eus  02/22/2012    Procedure: UPPER ENDOSCOPIC ULTRASOUND (EUS) RADIAL;  Surgeon: Arta Silence, MD;  Location: [facility];  Service: Endoscopy;  Laterality: N/A;  . Hernia repair    . Transurethral resection of prostate    . Aortogram  04/25/2013  . Esophagogastroduodenoscopy Left 04/27/2013    Procedure: ESOPHAGOGASTRODUODENOSCOPY (EGD);  Surgeon: Arta Silence, MD;  Location: [facility] ENDOSCOPY;  Service: Endoscopy;  Laterality: Left;  . Femoral-femoral bypass graft Bilateral 05/01/2013    Procedure: BYPASS GRAFT FEMORAL-FEMORAL ARTERY/ LEFT  - RIGHT;  Surgeon: Rosetta Posner, MD;  Location: [place];  Service: Vascular;  Laterality: Bilateral;  . Abdominal aortagram N/A 04/25/2013    Procedure: ABDOMINAL Maxcine Ham;  Surgeon: Conrad Centerville, MD;  Location: [facility] CATH LAB;  Service: Cardiovascular;  Laterality: N/A;    Allergies: Lipitor  Medications: Prior to Admission medications   Medication Sig Start Date End Date Taking? Authorizing Provider  albuterol (PROVENTIL) (2.5 MG/3ML) 0.083% nebulizer solution Take 3 mLs (2.5 mg total) by nebulization every 4 (four) hours as needed for wheezing or shortness of breath. 06/30/14  Yes Brand Males, MD  ALPRAZolam Duanne Moron) 1 MG tablet Take 1 and 1/2 tablet by mouth up to twice daily as needed Patient taking differently: Take 1.5 mg by mouth 2 (two) times daily as needed for anxiety.  12/30/13  Yes Hennie Duos, MD  Fluticasone-Salmeterol (ADVAIR) 250-50 MCG/DOSE AEPB Inhale 1 puff into the lungs every 12 (twelve) hours. 04/11/14  Yes Tammy S Parrett, NP  oxycodone (ROXICODONE) 30 MG immediate release tablet Take 1 tablet (30 mg total) by mouth every 3 (three) hours as needed for pain (Must last 28 days). 06/02/14  Yes Britt Bottom, MD  rosuvastatin (CRESTOR) 20 MG tablet Take 20 mg by mouth at bedtime.    Yes Historical Provider, MD     Family History  Problem Relation Age of Onset  . Hypertension Mother   . Cancer Mother     colon cancer  . Stroke Mother   . Aneurysm Father     abdominal aortic  . Heart disease Brother     History   Social History  . Marital Status: Widowed    Spouse Name: N/A  . Number of Children: 4  . Years of Education: HS   Occupational History  . Retired    Social History Main Topics  . Smoking status: Current Every Day Smoker -- 0.50 packs/day for 63 years    Types: Cigarettes    Last Attempt to Quit: 04/30/2013  . Smokeless tobacco: Never Used  . Alcohol Use: No     Comment: 04/25/2013 "used to be a heavy drinker; stopped drinking in 1975"  .  Drug Use: No  . Sexual Activity: No   Other Topics Concern  . None   Social History Narrative   Patient is right handed.   Patient drinks 4 sodas a day.   Review of Systems: A 12 point ROS discussed and pertinent positives are indicated in the HPI above.  All other systems are negative.  Review of Systems  Vital Signs: BP 111/72 mmHg  Pulse 69  Temp(Src) 97.6 F (36.4 C) (Oral)  Resp 17  Wt 98 lb 3.2 oz (44.543 kg)  SpO2 97%  Physical Exam  Constitutional: He is oriented to person, place, and time. No distress.  HENT:  Head: Normocephalic and atraumatic.  Neck: No tracheal deviation present.  Cardiovascular: Normal rate and regular rhythm.  Exam reveals no gallop and no friction rub.   No murmur heard. Pulmonary/Chest: Effort normal.  Diminished breath sounds bilaterally   Abdominal: Soft. Bowel sounds are normal. He exhibits no distension. There is no tenderness.  Neurological: He is alert and oriented to person, place, and time.  Skin: Skin is warm and dry. He is not diaphoretic.   Mallampati Score:  MD Evaluation Airway: WNL Heart: WNL Abdomen: WNL Chest/ Lungs: WNL ASA  Classification: 3 Mallampati/Airway Score: Two  Imaging: Dg Chest 2 View  06/10/2014   CLINICAL DATA:  Status post fall; concern for chest injury. Initial encounter.  EXAM: CHEST  2 VIEW  COMPARISON:  Chest radiograph performed 03/21/2014, and PET/CT performed 04/07/2014  FINDINGS: The patient's left upper lobe lung mass has increased significantly in size, now measuring 5.6 cm, in comparison to 3.9 cm on the prior PET/CT, and 3.5 cm on the prior chest radiograph. Known underlying mediastinal lymphadenopathy is not well characterized on radiograph. Patchy left basilar airspace opacity may reflect atelectasis, though metastatic disease cannot be excluded. The right lung appears relatively clear.  The lungs are hyperexpanded, with flattening of the hemidiaphragms, compatible with COPD. A small left  pleural effusion is noted. No pneumothorax is seen.  The heart is normal in size. No acute osseous abnormalities are identified.  IMPRESSION: 1. Left upper lobe lung mass has increased significantly in size, now measuring 5.6 cm, in comparison to 3.9 cm on the prior PET/CT, and 3.5 cm on the prior chest radiograph. This is compatible with primary bronchogenic malignancy. Known underlying mediastinal lymphadenopathy is not well characterized on radiograph. 2. Patchy left basilar airspace opacity may reflect atelectasis, though new metastatic disease cannot be excluded. Small left pleural effusion noted. 3. Findings of COPD.  These results were called by telephone at the time of interpretation on 06/10/2014 at 12:21 am to Dr. Jola Schmidt, who verbally acknowledged these results.   Electronically Signed   By: Garald Balding M.D.   On: 06/10/2014 00:22   Dg Elbow Complete Right  06/10/2014   CLINICAL DATA:  80 year old male with elbow laceration after falling  EXAM: RIGHT ELBOW - COMPLETE 3+ VIEW  COMPARISON:  None.  FINDINGS: There is no evidence of fracture, dislocation, or joint effusion. There is no evidence of arthropathy or other focal bone abnormality. Soft tissue swelling posterior to the elbow joint.  IMPRESSION: No acute fracture or malalignment.   Electronically Signed   By: Jacqulynn Cadet M.D.   On: 06/10/2014 00:16   Ct Head Wo Contrast  06/10/2014   CLINICAL DATA:  80 year old male status post fall while exiting has home earlier today. Positive loss of consciousness.  EXAM: CT HEAD WITHOUT CONTRAST  CT CERVICAL SPINE WITHOUT CONTRAST  TECHNIQUE: Multidetector CT imaging of the head and cervical spine was performed following the standard protocol without intravenous contrast. Multiplanar CT image reconstructions of the cervical spine were also generated.  COMPARISON:  Prior PET-CT 04/07/2014; prior CT scan cervical spine 11/15/2012 ; prior CT scan of the head 08/14/2011  FINDINGS: CT HEAD FINDINGS   Negative for acute intracranial hemorrhage, acute infarction, mass, mass effect, hydrocephalus or midline shift. Gray-white differentiation is preserved throughout. No focal scalp hematoma or calvarial injury. Globes and orbits are intact bilaterally. Atherosclerotic calcifications in both cavernous carotid arteries.  CT CERVICAL SPINE FINDINGS  No acute fracture, malalignment or prevertebral soft tissue swelling. Surgical  changes of prior anterior cervical discectomy and fusion at C4-C5. Additionally, there is bony ankylosis at C3-C4 and C5-C6, C6-C7. There is advanced multilevel associated degenerative disc disease as well as slight dextro convex scoliosis which may be positional. The bones appear osteopenic. Unremarkable CT appearance of the thyroid gland. No acute soft tissue abnormality. Advanced centrilobular emphysema in the bilateral upper lungs.  IMPRESSION: CT HEAD  1. No acute intracranial abnormality. 2. Intracranial atherosclerosis. CT CSPINE  1. No acute fracture or malalignment. 2. Surgical changes prior ACDF at C4-C5. 3. Extensive multi level cervical spondylosis with bony ankylosis at C3-C4 and C5-C6, C6-C7. 4. Advanced pulmonary emphysema.   Electronically Signed   By: Jacqulynn Cadet M.D.   On: 06/10/2014 00:46   Ct Cervical Spine Wo Contrast  06/10/2014   CLINICAL DATA:  80 year old male status post fall while exiting has home earlier today. Positive loss of consciousness.  EXAM: CT HEAD WITHOUT CONTRAST  CT CERVICAL SPINE WITHOUT CONTRAST  TECHNIQUE: Multidetector CT imaging of the head and cervical spine was performed following the standard protocol without intravenous contrast. Multiplanar CT image reconstructions of the cervical spine were also generated.  COMPARISON:  Prior PET-CT 04/07/2014; prior CT scan cervical spine 11/15/2012 ; prior CT scan of the head 08/14/2011  FINDINGS: CT HEAD FINDINGS  Negative for acute intracranial hemorrhage, acute infarction, mass, mass effect,  hydrocephalus or midline shift. Gray-white differentiation is preserved throughout. No focal scalp hematoma or calvarial injury. Globes and orbits are intact bilaterally. Atherosclerotic calcifications in both cavernous carotid arteries.  CT CERVICAL SPINE FINDINGS  No acute fracture, malalignment or prevertebral soft tissue swelling. Surgical changes of prior anterior cervical discectomy and fusion at C4-C5. Additionally, there is bony ankylosis at C3-C4 and C5-C6, C6-C7. There is advanced multilevel associated degenerative disc disease as well as slight dextro convex scoliosis which may be positional. The bones appear osteopenic. Unremarkable CT appearance of the thyroid gland. No acute soft tissue abnormality. Advanced centrilobular emphysema in the bilateral upper lungs.  IMPRESSION: CT HEAD  1. No acute intracranial abnormality. 2. Intracranial atherosclerosis. CT CSPINE  1. No acute fracture or malalignment. 2. Surgical changes prior ACDF at C4-C5. 3. Extensive multi level cervical spondylosis with bony ankylosis at C3-C4 and C5-C6, C6-C7. 4. Advanced pulmonary emphysema.   Electronically Signed   By: Jacqulynn Cadet M.D.   On: 06/10/2014 00:46   Dg Chest Port 1 View  06/30/2014   CLINICAL DATA:  Hemoptysis.  EXAM: PORTABLE CHEST - 1 VIEW  COMPARISON:  06/09/2014.  03/28/2014.  FINDINGS: Cavitary LEFT upper lobe mass measures 79 mm long axis. Emphysema. No focal airspace disease or postobstructive pneumonia is identified. Cardiopericardial silhouette within normal limits. Aortic arch atherosclerosis.  IMPRESSION: 1. Enlarging cavitary LEFT upper lobe mass consistent with bronchogenic carcinoma. Peritumoral hemorrhage probably represents the cause of hemoptysis. 2. Emphysema. 3. No postobstructive pneumonia or radiographic evidence of pulmonary hemorrhage in this patient with hemoptysis.   Electronically Signed   By: Dereck Ligas M.D.   On: 06/30/2014 18:15    Labs:  CBC:  Recent Labs   03/21/14 1939 06/10/14 1706 06/30/14 1845  WBC 8.7 7.4 5.9  HGB 13.8 11.1* 10.1*  HCT 41.2 33.5* 32.2*  PLT 124* 222.0 191    COAGS:  Recent Labs  06/10/14 1706 06/30/14 1845  INR 1.1* 1.11  APTT 28.0  --     BMP:  Recent Labs  03/21/14 1611 03/21/14 1939 06/30/14 1845  NA 140 139 137  K 6.4* 4.0  4.2  CL 102 101 99*  CO2 34* 30 28  GLUCOSE 110* 105* 106*  BUN 24* 22 10  CALCIUM 10.1 9.2 8.6*  CREATININE 1.17 1.04 1.04  GFRNONAA  --  67* >60  GFRAA  --  78* >60    LIVER FUNCTION TESTS:  Recent Labs  06/30/14 1845  BILITOT 0.4  AST 15  ALT 9*  ALKPHOS 84  PROT 6.2*  ALBUMIN 2.5*   Assessment and Plan: Hemoptysis x 2 weeks, known LUL lung mass- Last PET 2/35/57, hypermetabolic- patient has cancelled previously scheduled outpatient biopsy appointments Weight loss Left sided chest pain, troponin negative-likely from lung mass Generalized weakness Current tobacco user Request for IR consult Images reviewed and plan to repeat CT imaging today with possible LUL lung mass biopsy with sedation following CT The patient has been NPO, no blood thinners taken, labs and vitals have been reviewed. Risks and Benefits discussed with the patient including, but not limited to bleeding, hemoptysis, respiratory failure requiring intubation, infection, pneumothorax requiring chest tube placement, stroke from air embolism or even death. All of the patient's questions were answered, patient is agreeable to proceed. Consent signed and in chart. COPD, per primary team HLP, per primary team   Thank you for this interesting consult.  I greatly enjoyed meeting MEHDI GIRONDA and look forward to participating in their care.  SignedHedy Jacob 07/01/2014, 10:01 AM   I spent a total of 40 Minutes in face to face in clinical consultation, greater than 50% of which was counseling/coordinating care for LUL lung mass.

## 2014-07-01 NOTE — Progress Notes (Signed)
PULMONARY / CRITICAL CARE MEDICINE   Name: [name] MRN: [id number] DOB: Oct 06, 1934    ADMISSION DATE:  06/30/14  CHIEF COMPLAINT:  hemoptysis   HISTORY OF PRESENT ILLNESS:    #COPD - pirometry 06/25/12" fev1 Pos BD fev1 1.17L/42% (+5% bd response), FVC 3.4L/88%, R 34, DLCO 36% and is consistent with Gold stage III COPD. On spiriva and symbicort  #Cachexia/ sever malnutrition - Body mass index is 17.38 kg/(m^2). 03/13/2014 - Body mass index is 16.64 kg/(m^2). 06/10/2014  #Chronic pain  #Lung mass LUL  #Smoker  reports that he has been smoking Cigarettes. He has a 31.5 pack-year smoking history. He has never used smokeless tobacco.  #Social isolation  - recently moved in with son         ADMIT 06/30/2014 In February 2016 he was diagnosed with a large left upper lobe lung mass. This was PET hot on PET scan. He also had a mediastinal node. He was set up for lung biopsy CT-guided by my nurse practitioner. But he did not show up for the lung biopsy. Then on 06/09/2014 he was involved in a Most recentlyin fact yesterday was involved in a minor trauma and he had a chest x-ray that showed worsening left upper lobe mass [personally reviewed and confirmed the image]. He followed up with me 06/10/2014 and at that point still had not had the biopsy. I learned from them that he is basically a nocturnist and goes to bed early in the morning therefore he is unable to make it in the morning for biopsies. He preferred afternoon biopsies by interventional radiology and he stated that they would not accommodate his request. At that visit 06/10/14  I had extensive counseling with him and son and warned of danger of life threatening cancer  and he agreed to have a morning biopsy. Now, It turns out that despite multiple attempts he could not make it to morning biopsies and his biopsy never got scheduled ordered was canceled on multiple occasions. I subsequently intervened and discussed with Dr. Jacqulynn Cadet and we agreed to have the biopsy done in the afternoon with patient subsequently being admitted under pulmonary service for postbiopsy observation. While we were trying to get this plan patient called in acutely 2 days ago before the weekend complaining of acute worsening of shortness of breath cough wheezing. I told him to go to the emergency department but he did not. Presents to the office today with her son. He has persistent minor  hemoptysis since last seeing me. No clear cut aggravating relieving factors. So far not worse. insidiious oinset. It appeared that he might have some COPD exacerbation but this really is not an issue and seems to be clearing/stable/resolving. The main thing is hemoptysis.  In addition he complains of depression, chronic neuropathic pain, poor quality of life. He is opening to having a frank discussion about goals of care. He is okay with hospice admission if his prognosis as bad but he would like to know his diagnosis of the lung mass.     SUBJECTIVE:   VITAL SIGNS: Temp:  [97.6 F (36.4 C)-98.5 F (36.9 C)] 97.6 F (36.4 C) (05/17 0503) Pulse Rate:  [69-96] 69 (05/17 0503) Resp:  [17-18] 17 (05/17 0503) BP: (111-151)/(64-72) 111/72 mmHg (05/17 0503) SpO2:  [92 %-100 %] 97 % (05/17 0920) Weight:  [98 lb 3.2 oz (44.543 kg)-102 lb (46.267 kg)] 98 lb 3.2 oz (44.543 kg) (05/16 2009) HEMODYNAMICS:   VENTILATOR SETTINGS:   INTAKE /  OUTPUT:  Intake/Output Summary (Last 24 hours) at 07/01/14 0957 Last data filed at 07/01/14 0600  Gross per 24 hour  Intake 1018.75 ml  Output     30 ml  Net 988.75 ml    PHYSICAL EXAMINATION: General:  Cachectic deconditioned male in bed [facility] Neuro:  Alert and oriented 3,. Speech normal HEENT:  Neck is supple no neck nodes, kyphosis  Cardiovascular:  Normal heart sounds regular rate and rhythm Lungs:  Wheezing thru out Abdomen:  Scaphoid, soft no mass Musculoskeletal:  No cyanosis no clubbing no edema but right  lower extremity is placed on a chair Skin:  Intact in exposed areas  LABS: On 06/10/2014: His hemoglobin was 7.1 g percent and mildly anemic, INR was 1.1  CBC  Recent Labs Lab 06/30/14 1845  WBC 5.9  HGB 10.1*  HCT 32.2*  PLT 191   Coag's  Recent Labs Lab 06/30/14 1845  INR 1.11   BMET  Recent Labs Lab 06/30/14 1845  NA 137  K 4.2  CL 99*  CO2 28  BUN 10  CREATININE 1.04  GLUCOSE 106*   Electrolytes  Recent Labs Lab 06/30/14 1845  CALCIUM 8.6*  MG 1.9  PHOS 3.4   Sepsis Markers  Recent Labs Lab 06/30/14 1845  LATICACIDVEN 1.1   ABG No results for input(s): PHART, PCO2ART, PO2ART in the last 168 hours. Liver Enzymes  Recent Labs Lab 06/30/14 1845  AST 15  ALT 9*  ALKPHOS 84  BILITOT 0.4  ALBUMIN 2.5*   Cardiac Enzymes  Recent Labs Lab 06/30/14 1845 06/30/14 2332 07/01/14 0536  TROPONINI <0.03 <0.03 <0.03   Glucose No results for input(s): GLUCAP in the last 168 hours.  Imaging Dg Chest Port 1 View  06/30/2014   CLINICAL DATA:  Hemoptysis.  EXAM: PORTABLE CHEST - 1 VIEW  COMPARISON:  06/09/2014.  03/28/2014.  FINDINGS: Cavitary LEFT upper lobe mass measures 79 mm long axis. Emphysema. No focal airspace disease or postobstructive pneumonia is identified. Cardiopericardial silhouette within normal limits. Aortic arch atherosclerosis.  IMPRESSION: 1. Enlarging cavitary LEFT upper lobe mass consistent with bronchogenic carcinoma. Peritumoral hemorrhage probably represents the cause of hemoptysis. 2. Emphysema. 3. No postobstructive pneumonia or radiographic evidence of pulmonary hemorrhage in this patient with hemoptysis.   Electronically Signed   By: Dereck Ligas M.D.   On: 06/30/2014 18:15   Chest x-ray today 06/30/2014: Shows enlarging left upper lobe mass-personally reviewed  ASSESSMENT / PLAN:  PULMONARY  A: #Baseline  - COPD #Current  - Left upper lobe mass-in need of biopsy - Hemoptysis-probably related to mass - new  since last seen 05/21/14. AT risk for severe transformation - Possible COPD exacerbation  P:   Ordered CT guided transthoracic needle biopsy - risks of pneumothorax, pneumothorax, non-diagnosis and sedation complications explained. He verbalized and agreed to proceed Monitor volume of hemoptysis: If large will need IR guided embolization COPD exacerbation: Steroids, antibiotics and bronchodilators  CARDIOVASCULAR  A: Nil acute but has hyperlipedmia P:  Monitor statin  RENAL A:  Patient complains of dehydration P:    Follow BMET mag, phos IV fluids  GASTROINTESTINAL A:  Severe protein calorie mother nutrition and cachexia P:   Nutrition consult pending as of 5/17 1000  HEMATOLOGIC  Recent Labs  06/30/14 1845  HGB 10.1*    A:  At risk for anemia of chronic disease  P:  DVT prophylaxis with SCDs Monitor hemoglobin  INFECTIOUS A:  Possible COPD exacerbation P:   respiratory section  ENDOCRINE  A:  No acute issue P:   Monitor  NEUROLOGIC A:  Chronic pain and depression and anxiety P:   Home pain meds - oxy , benzo and gabapentin Palliative consult  FAMILY  -Call palliative care consult for pain management, advance care planning, goals of care, CODE STATUS and prognosis. Currently full code.    Richardson Landry Minor ACNP Maryanna Shape PCCM Pager [phone] till 3 pm If no answer page [phone] 07/01/2014, 9:57 AM

## 2014-07-01 NOTE — Progress Notes (Signed)
eLink Physician-Brief Progress Note Patient Name: [name] DOB: [birth year] MRN: [id number]   Date of Service  07/01/2014  HPI/Events of Note  Continued pain despite q6 hour oxy IR 30 mg  eICU Interventions  Plan:  One time dose of Oxy IR 20 mg po for pain     Intervention Category Intermediate Interventions: Pain - evaluation and management  DETERDING,ELIZABETH 07/01/2014, 4:36 AM

## 2014-07-02 ENCOUNTER — Inpatient Hospital Stay (HOSPITAL_COMMUNITY): Payer: Medicare Other

## 2014-07-02 DIAGNOSIS — Z515 Encounter for palliative care: Secondary | ICD-10-CM

## 2014-07-02 DIAGNOSIS — E785 Hyperlipidemia, unspecified: Secondary | ICD-10-CM

## 2014-07-02 DIAGNOSIS — E43 Unspecified severe protein-calorie malnutrition: Secondary | ICD-10-CM

## 2014-07-02 DIAGNOSIS — G629 Polyneuropathy, unspecified: Secondary | ICD-10-CM

## 2014-07-02 DIAGNOSIS — Z604 Social exclusion and rejection: Secondary | ICD-10-CM

## 2014-07-02 DIAGNOSIS — J441 Chronic obstructive pulmonary disease with (acute) exacerbation: Principal | ICD-10-CM

## 2014-07-02 DIAGNOSIS — R52 Pain, unspecified: Secondary | ICD-10-CM

## 2014-07-02 DIAGNOSIS — F329 Major depressive disorder, single episode, unspecified: Secondary | ICD-10-CM

## 2014-07-02 DIAGNOSIS — R630 Anorexia: Secondary | ICD-10-CM

## 2014-07-02 DIAGNOSIS — D649 Anemia, unspecified: Secondary | ICD-10-CM

## 2014-07-02 MED ORDER — DOXYCYCLINE HYCLATE 100 MG PO TABS
100.0000 mg | ORAL_TABLET | Freq: Two times a day (BID) | ORAL | Status: DC
  Administered 2014-07-02 – 2014-07-04 (×5): 100 mg via ORAL
  Filled 2014-07-02 (×6): qty 1

## 2014-07-02 MED ORDER — OXYCODONE HCL 5 MG PO TABS: 30.0000 mg | ORAL_TABLET | Freq: Four times a day (QID) | ORAL | Status: DC | PRN

## 2014-07-02 MED ORDER — ENOXAPARIN SODIUM 30 MG/0.3ML ~~LOC~~ SOLN
30.0000 mg | SUBCUTANEOUS | Status: DC
  Administered 2014-07-02 – 2014-07-04 (×3): 30 mg via SUBCUTANEOUS
  Filled 2014-07-02 (×3): qty 0.3

## 2014-07-02 MED ORDER — OXYCODONE HCL 5 MG PO TABS
15.0000 mg | ORAL_TABLET | Freq: Four times a day (QID) | ORAL | Status: DC | PRN
  Administered 2014-07-02 – 2014-07-04 (×7): 15 mg via ORAL
  Filled 2014-07-02 (×7): qty 3

## 2014-07-02 MED ORDER — PREDNISONE 20 MG PO TABS
40.0000 mg | ORAL_TABLET | Freq: Every day | ORAL | Status: DC
  Administered 2014-07-02 – 2014-07-04 (×3): 40 mg via ORAL
  Filled 2014-07-02 (×4): qty 2

## 2014-07-02 NOTE — Consult Note (Signed)
Consultation Note Date: 07/02/2014   Patient Name: [name]  DOB: [birth year]  MRN: [id number]  Age / Sex: 80 y.o., male   PCP: Gaynelle Arabian, MD Referring Physician: Brand Males, MD  Reason for Consultation: [place] Assessment and Plan Summary of Established Goals of Care and Medical Treatment Preferences    Palliative Care Discussion Held Today Contacts/Participants in Discussion:   Met with [name] today who explains his complex history of numerous health issues and surgical procedures. He explains to me that he has constant pain and very poor quality of life. He tells me that he wakes up and gets dressed in the morning but then has nothing and no one to speak with or see. He tells me that he very much enjoyed speaking with the chaplain and also with myself. We discussed this lung mass that was biopsied and I expressed my concern especially given how quickly it grew in size (he was shocked when I read him the measurements from CT/PET scans). We discussed the difficult decisions that he will likely be faced with soon. Encouraged him to consider his options and how these will impact his quality of life. He acknowledges that he doesn't think his body can tolerate much more. We discussed hospice care and how helpful this could be for him (especially with chaplain/social work/volunteers to visit with him). He is open to these options. We also discussed resuscitation and he says "I try not to think about these things" but he understands why he should. He does say he would not want tracheostomy/long term ventilation. He is not prepared to make any decisions today but knows he has a lot to consider. He does say he will likely do "whatever my doctors recommend - they are the professionals."  Code Status/Advance Care Planning:  FULL  Symptom Management:   Lung biopsy pain: Continue oxycodone 15 mg every 6 hours prn.   Neuropathic pain bilat legs: Continue gabapentin 300 TID  - he says this gives him little relief. Also utilize oxycodone prn - could consider Oxycontin 30 mg BID (would likely need titration up from this). Recommend Cymbalta that could potentially help with pain and also depression.   Decreased appetite/malnutrition: Has lost 50-60 lbs over past year. Continue small/frequent meals. Continue Ensure 2-3 times a day.   Falls: Discussed importance for him to have a first alert button for his safety.   Psycho-social/Spiritual:   Support System: He has a son and daughter who are supportive but have their own health issues. He says his son-in-law and daughter-in-law are supportive and help make sure he has meals. He is still fairly lonely.  Desire for further Chaplaincy support: yes  Prognosis: Likely < 6 months.   Discharge Planning: Home when stable - likely with home health but may consider hospice if he desires this.        Chief Complaint/History of Present Illness: 80 yo male with PMH of COPD GOLD stage III, depression, smoker, malnutrition, LUL lung mass, osteoporosis, spinal stenosis, anemia, peripheral neuropathy, prostate cancer, rheumatoid arthritis, degenerative disc disease. PMH review. Admitted with hemoptysis and proceeding with LUL lung biopsy with enlarging mass.   Primary Diagnoses  Present on Admission:  . Hemoptysis . Protein-calorie malnutrition, severe . Hyperlipidemia . Pain . COPD exacerbation . Depression . Lung mass  Palliative Review of Systems: + severe bilateral lower extremity/feet neuropathic pain, + arthritic pain, + pain in biopsy site, + decreased appetite/weight loss, + falls, + weakness, + sleep disturbance (  r/t pain)  I have reviewed the medical record, interviewed the patient and family, and examined the patient. The following aspects are pertinent.  Past Medical History  Diagnosis Date  . History of colon polyps   . Spinal stenosis   . Anxiety   . BPH (benign prostatic hypertrophy)   . ED (erectile  dysfunction)   . Osteoporosis   . Hypertension   . Chest pain     "I've had it" (01/31/2012)   . Peripheral vascular disease     "right leg is 100% blocked; left leg has stent, still ~ 75% blocked" (01/31/2012)  . Peripheral neuropathy     "bad" (01/31/2012)  . COPD (chronic obstructive pulmonary disease)   . Emphysema   . BRBPR (bright red blood per rectum)     "don't know what it's from; had some this week" (01/31/2012)  . Chronic lower back pain   . Pernicious anemia   . GERD (gastroesophageal reflux disease)   . Polyneuropathy in other diseases classified elsewhere 11/06/2012  . Prostate cancer 2004    "had 40 treatments of radiation" (01/31/2012)  . Breast cancer in male     "left" (01/31/2012)  . Basal cell carcinoma of nasal tip   . Pneumonia 1960's    "double"   . Cervical spondylosis   . Rheumatoid arthritis(714.0)   . Arthritis     "all over my body" (04/25/2013)  . DDD (degenerative disc disease), lumbosacral     "S1; L5" (01/31/2012)  . Lumbosacral spondylosis without myelopathy 11/06/2012  . Cellulitis of right leg   . Depression     "won't take RX though" (04/25/2013)  . Falls frequently   . Severe malnutrition 11/27/2013  . Hyperlipidemia    History   Social History  . Marital Status: Widowed    Spouse Name: N/A  . Number of Children: 4  . Years of Education: HS   Occupational History  . Retired    Social History Main Topics  . Smoking status: Current Every Day Smoker -- 0.50 packs/day for 63 years    Types: Cigarettes    Last Attempt to Quit: 04/30/2013  . Smokeless tobacco: Never Used  . Alcohol Use: No     Comment: 04/25/2013 "used to be a heavy drinker; stopped drinking in 1975"  . Drug Use: No  . Sexual Activity: No   Other Topics Concern  . None   Social History Narrative   Patient is right handed.   Patient drinks 4 sodas a day.   Family History  Problem Relation Age of Onset  . Hypertension Mother   . Cancer Mother     colon  cancer  . Stroke Mother   . Aneurysm Father     abdominal aortic  . Heart disease Brother    Scheduled Meds: . doxycycline  100 mg Oral Q12H  . enoxaparin (LOVENOX) injection  30 mg Subcutaneous Q24H  . feeding supplement (ENSURE ENLIVE)  237 mL Oral BID BM  . ferrous sulfate  325 mg Oral Q breakfast  . gabapentin  300 mg Oral TID  . ipratropium-albuterol  3 mL Nebulization Q6H  . pantoprazole  40 mg Oral Daily  . predniSONE  40 mg Oral Q breakfast  . rosuvastatin  20 mg Oral QHS  . sertraline  75 mg Oral QHS   Continuous Infusions:  PRN Meds:.sodium chloride, ALPRAZolam, oxycodone Medications Prior to Admission:  Prior to Admission medications   Medication Sig Start Date End Date Taking? Authorizing Provider  albuterol (PROVENTIL) (2.5 MG/3ML) 0.083% nebulizer solution Take 3 mLs (2.5 mg total) by nebulization every 4 (four) hours as needed for wheezing or shortness of breath. 06/30/14  Yes Brand Males, MD  ALPRAZolam Duanne Moron) 1 MG tablet Take 1 and 1/2 tablet by mouth up to twice daily as needed Patient taking differently: Take 1.5 mg by mouth 2 (two) times daily as needed for anxiety.  12/30/13  Yes Hennie Duos, MD  Fluticasone-Salmeterol (ADVAIR) 250-50 MCG/DOSE AEPB Inhale 1 puff into the lungs every 12 (twelve) hours. 04/11/14  Yes Tammy S Parrett, NP  rosuvastatin (CRESTOR) 20 MG tablet Take 20 mg by mouth at bedtime.    Yes Historical Provider, MD  oxycodone (ROXICODONE) 30 MG immediate release tablet Take 1 tablet (30 mg total) by mouth every 3 (three) hours as needed for pain (Must last 28 days). 07/01/14   Kathrynn Ducking, MD   Allergies  Allergen Reactions  . Lipitor [Atorvastatin Calcium] Other (See Comments)    Myalgias "don't remember how bad" (01/31/2012)   CBC:    Component Value Date/Time   WBC 5.9 06/30/2014 1845   HGB 10.1* 06/30/2014 1845   HCT 32.2* 06/30/2014 1845   PLT 191 06/30/2014 1845   MCV 93.3 06/30/2014 1845   NEUTROABS 4.7 06/30/2014  1845   LYMPHSABS 0.7 06/30/2014 1845   MONOABS 0.5 06/30/2014 1845   EOSABS 0.0 06/30/2014 1845   BASOSABS 0.0 06/30/2014 1845   Comprehensive Metabolic Panel:    Component Value Date/Time   NA 137 06/30/2014 1845   K 4.2 06/30/2014 1845   CL 99* 06/30/2014 1845   CO2 28 06/30/2014 1845   BUN 10 06/30/2014 1845   CREATININE 1.04 06/30/2014 1845   GLUCOSE 106* 06/30/2014 1845   CALCIUM 8.6* 06/30/2014 1845   AST 15 06/30/2014 1845   ALT 9* 06/30/2014 1845   ALKPHOS 84 06/30/2014 1845   BILITOT 0.4 06/30/2014 1845   PROT 6.2* 06/30/2014 1845   ALBUMIN 2.5* 06/30/2014 1845    Physical Exam: Vital Signs: BP 128/54 mmHg  Pulse 74  Temp(Src) 98.1 F (36.7 C) (Oral)  Resp 17  Wt 45.36 kg (100 lb)  SpO2 96% SpO2: SpO2: 96 % O2 Device: O2 Device: Not Delivered O2 Flow Rate: O2 Flow Rate (L/min): 2 L/min Intake/output summary:  Intake/Output Summary (Last 24 hours) at 07/02/14 1247 Last data filed at 07/02/14 0902  Gross per 24 hour  Intake   1087 ml  Output   1276 ml  Net   -189 ml   LBM:  5/18 Baseline Weight: Weight: 44.543 kg (98 lb 3.2 oz) Most recent weight: Weight: 45.36 kg (100 lb)  Exam Findings:  Gen: Sitting up in bed, NAD, cachectic HEENT: Temporal muscle wasting, no JVD, moist mucous membranes CV: RRR Pulm: No labored breathing, room air, able to converse with ease Abd: Soft, NT, ND Extremities: MAE, no edema, warm to touch Neuro: Awake, alert, oriented x 3, pleasant         Palliative Performance Scale: 50%              Additional Data Reviewed: Recent Labs     06/30/14  1845  WBC  5.9  HGB  10.1*  PLT  191  NA  137  BUN  10  CREATININE  1.04     Time In: 1130  Time Out: 1245 Time Total: 59mn  Greater than 50%  of this time was spent counseling and coordinating care related to the above assessment  and plan.  Signed by: Pershing Proud, NP  Pershing Proud, NP  6/38/4665, 12:47 PM  Please contact Palliative Medicine Team phone at  [phone] for questions and concerns.

## 2014-07-02 NOTE — Progress Notes (Addendum)
PULMONARY / CRITICAL CARE MEDICINE   Name: [name] MRN: [id number] DOB: [birth year]    ADMISSION DATE:  06/30/14  CHIEF COMPLAINT:  hemoptysis  BACKGROUND  #COPD - pirometry 06/25/12" fev1 Pos BD fev1 1.17L/42% (+5% bd response), FVC 3.4L/88%, R 34, DLCO 36% and is consistent with Gold stage III COPD. On spiriva and symbicort  #Cachexia/ sever malnutrition - Body mass index is 17.38 kg/(m^2). 03/13/2014 - Body mass index is 16.64 kg/(m^2). 06/10/2014  #Chronic pain  #Lung mass LUL  #Smoker  reports that he has been smoking Cigarettes. He has a 31.5 pack-year smoking history. He has never used smokeless tobacco.  #Social isolation  - recently moved in with son         BREIF  06/30/2014 - admit Admit fo AECOPD,  Hemoptysis, lung mass in need of biopsy and iIn addition he complains of depression, chronic neuropathic pain, poor quality of life and severe cachexia, protein calorie malnutrition and social isolation. He is opening to having a frank discussion about goals of care. He is okay with hospice admission if his prognosis as bad but he would like to know his diagnosis of the lung mass.   5./17/16 - sp LUL mass biopsy  SUBJECTIVE:  07/02/2014:Pain better. Overall not feeling upto it and feels miserable. Does not want dc today. Has pain in LUL biopsy site - new, bad, CXR this am no ptx.   Hx from RN at bedside: IV infiltrated last night (confirmed in chart review), Better now. RN feels tele, and continuous pulse ox can be dc'ed. RN recommending PT. Palliative care consult still pending - called them: they will see him today. Explained indications of consult   VITAL SIGNS: Temp:  [97.8 F (36.6 C)-98.7 F (37.1 C)] 98.1 F (36.7 C) (05/18 0427) Pulse Rate:  [68-82] 74 (05/18 0427) Resp:  [10-17] 17 (05/18 0427) BP: (109-142)/(48-67) 128/54 mmHg (05/18 0427) SpO2:  [92 %-100 %] 96 % (05/18 0427) Weight:  [45.36 kg (100 lb)] 45.36 kg (100 lb) (05/17  2025) HEMODYNAMICS:   VENTILATOR SETTINGS:   INTAKE / OUTPUT:  Intake/Output Summary (Last 24 hours) at 07/02/14 0848 Last data filed at 07/02/14 0648  Gross per 24 hour  Intake    967 ml  Output    676 ml  Net    291 ml    PHYSICAL EXAMINATION: General:  Cachectic deconditioned male in bed [facility] Neuro:  Alert and oriented 3,. Speech normal HEENT:  Neck is supple no neck nodes, kyphosis  Cardiovascular:  Normal heart sounds regular rate and rhythm Lungs:  Wheezing thru out Abdomen:  Scaphoid, soft no mass Musculoskeletal:  No cyanosis no clubbing no edema but right lower extremity is placed on a chair Skin:  Intact in exposed areas  LABS: On 06/10/2014: His hemoglobin was 7.1 g percent and mildly anemic, INR was 1.1  No new labs 07/02/14 CBC  Recent Labs Lab 06/30/14 1845  WBC 5.9  HGB 10.1*  HCT 32.2*  PLT 191   Coag's  Recent Labs Lab 06/30/14 1845  INR 1.11   BMET  Recent Labs Lab 06/30/14 1845  NA 137  K 4.2  CL 99*  CO2 28  BUN 10  CREATININE 1.04  GLUCOSE 106*   Electrolytes  Recent Labs Lab 06/30/14 1845  CALCIUM 8.6*  MG 1.9  PHOS 3.4   Sepsis Markers  Recent Labs Lab 06/30/14 1845  LATICACIDVEN 1.1   ABG No results for input(s): PHART, PCO2ART, PO2ART in the last  168 hours. Liver Enzymes  Recent Labs Lab 06/30/14 1845  AST 15  ALT 9*  ALKPHOS 84  BILITOT 0.4  ALBUMIN 2.5*   Cardiac Enzymes  Recent Labs Lab 06/30/14 1845 06/30/14 2332 07/01/14 0536  TROPONINI <0.03 <0.03 <0.03   Glucose No results for input(s): GLUCAP in the last 168 hours.   Imaging - highlighted image reviewed and visualized personally Ct Biopsy  07/01/2014   INDICATION: Hypermetabolic mass within the left upper lobe worrisome for bronchogenic carcinoma. Please perform CT-guided biopsy for tissue diagnostic purposes  EXAM: CT-GUIDED BIOPSY OF HYPERMETABOLIC MASS WITHIN THE LEFT UPPER LOBE  COMPARISON:  PET-CT- 04/07/2014; chest CT -  03/28/2014  MEDICATIONS: Fentanyl 50 mcg IV; Versed 1 mg IV  ANESTHESIA/SEDATION: Sedation time  15 minutes  CONTRAST:  None  COMPLICATIONS: None immediate.  PROCEDURE: Informed consent was obtained from the patient following an explanation of the procedure, risks, benefits and alternatives. The patient understands,agrees and consents for the procedure. All questions were addressed. A time out was performed prior to the initiation of the procedure.  The patient was positioned supine on the CT table and a limited chest CT was performed for procedural planning demonstrating interval enlargement in the known hypermetabolic pulmonary mass, now measuring 6.8 x 4.9 cm (image 20, series 2), previously, approximately 3.9 x 2.5 cm on preceding PET-CT performed 04/07/2014. Grossly unchanged mediastinal adenopathy with index AP window nodal conglomeration measuring approximately 1.3 cm in greatest short axis diameter (image 21, series 2).  The operative site was prepped and draped in the usual sterile fashion. Under sterile conditions and local anesthesia, a 17 gauge coaxial needle was advanced into the peripheral aspect of the nodule. Positioning was confirmed with intermittent CT fluoroscopy and followed by the acquisition of 5 core needle biopsies with an 18 gauge core needle biopsy device.  Limited post procedural chest CT was negative for pneumothorax or additional complication. The co-axial needle was removed and hemostasis was achieved with manual compression. A dressing was placed. The patient tolerated the procedure well without immediate postprocedural complication. The patient was escorted to have an upright chest radiograph.  IMPRESSION: Technically successful CT guided core needle core biopsy of enlarging hypermetabolic left upper lobe pulmonary mass, currently measuring 6.8 cm in maximal diameter, previously, 3.9 cm on PET-CT performed 04/07/2014.   Electronically Signed   By: Sandi Mariscal M.D.   On: 07/01/2014  16:37   Dg Chest Port 1 View  07/01/2014   CLINICAL DATA:  Shortness of breath, LEFT lung mass post biopsy  EXAM: PORTABLE CHEST - 1 VIEW  COMPARISON:  CT chest 07/01/2014, chest radiograph 06/30/2014  FINDINGS: Normal heart size and pulmonary vascularity.  Atherosclerotic calcification thoracic aorta.  Large LEFT upper lobe mass again identified, 8.0 x 6.4 cm.  Mild bibasilar atelectasis, new.  Underlying emphysematous changes.  No definite infiltrate, pleural effusion, or pneumothorax.  IMPRESSION: Emphysematous changes with bibasilar atelectasis.  Large LEFT upper lobe mass unchanged.  No pneumothorax post biopsy.   Electronically Signed   By: Lavonia Dana M.D.   On: 07/01/2014 14:02     ASSESSMENT / PLAN:  PULMONARY  A: #Baseline  - COPD #Current  - Left upper lobe mass-s/p biopsy 07/01/14 - results pending. Has pain at bx site 07/02/14 - new but CXR this am ok - Hemoptysis-probably related to mass - new since last seen 06/10/14. AT risk for severe transformation but none since admit 06/30/2014 - Possible COPD exacerbation  - improved 07/02/2014. IV infiltrated. Ready to  move to po   P:   Lung mass - await biopsy. REPEAT CXR 3pm to ensure ok Hemoptysis - reassuring none since admit. Will monitor.  COPD exacerbation: Steroids, antibiotics and bronchodilators but change steroid and abx to po  CARDIOVASCULAR  A: Hyperlipedmia. MI ruled out.   BP/HR stable P:  Monitor Statin to continue Dc tele  RENAL A: Nil acute  P:    saline lock  GASTROINTESTINAL A:  Severe protein calorie mother nutrition and cachexia P:   Appareciate nutrition consult 07/01/14  HEMATOLOGIC   A:  anemia of chronic disease  + - new dx At risk for DVT  P:  Start SQ Lovenox for DVT proph (monitor hemoptysis) due to cancer -dc if hemoptysis recyurs Monitor hemoglobin   INFECTIOUS A:  COPD exacerbation P:   respiratory section  ENDOCRINE A:  No acute issue P:   Monitor  NEUROLOGIC A:   Chronic pain and depression and anxiety  - 07/02/14 : 07/02/2014: at baseline and improved after I increased Oxy IR frequency  P:   Home pain meds - oxy Q3h opoid to continue, benzo and gabapentin Palliative consult called  GLOBAL A: very decondtioned P PT consult   FAMILY  -Called  palliative care consult over phone 07/02/2014:  for pain management, advance care planning, goals of care, CODE STATUS and prognosis. Currently full code.. Also called spiritual and social work     Dr. Brand Males, M.D., [facility].C.P Pulmonary and Critical Care Medicine Staff Physician [facility] Pager: [phone], If no answer or between  15:00h - 7:00h: call [phone]  07/02/2014 8:58 AM

## 2014-07-02 NOTE — Progress Notes (Signed)
Chaplain Note:   Chaplain visited with Mr. [name], Mr. [name] presented as a robust, conversational, and expressive individual throughout the duration of the visit.   He shared his extensive medical history and the effects its had on his life.   He mentioned he's had over 20 surgeries and has rehabilitated and recovered the best that he could. He is very used to living alone. His children suggested he go into an assisted living facility; however, he does not want to "sign away his money and his life".   His wife passed 12 years ago and he doesn't go a day without thinking of her.   When asked about his goals, he expressed that he doesn't really know but would like to go to the beach and sit by the water because he likes sitting by the water. "All I get to do is go to the doctor and church, I would like to go somewhere else sometimes (laughs)"   He cites that his pain is so severe that he doesn't get much sleep until his feet begin to flare. "I've taken and tried everything, relief is far away most times"   His children call all of the time; however, they have some significant medical issues as well. He cites that they and his in-laws do treat him well.   He noted that he lost 60 lbs and it's not intentional, he simply doesn't have an appetite. He said it's not a matter of getting food, it's that he doesn't feel like eating and it's also hard for him to swallow.   When asked if he was afraid with the recent discovery of some serious issues, he responded "No, Im not afraid because if I die, I know where I'm going" He speaks strongly about his spirituality and it has been a positive resource in his coping. He has financial concerns noting that his treatment is "expensive" and expresses his wrestling in "it's in God's hands now"   [name], [name] 07/02/2014

## 2014-07-02 NOTE — Evaluation (Signed)
Physical Therapy Evaluation Patient Details Name: [name] MRN: [id number] DOB: [birth year] Today's Date: 07/02/2014   History of Present Illness  Admit fo AECOPD, Hemoptysis, lung mass in need of biopsy and iIn addition he complains of depression, chronic neuropathic pain, poor quality of life and severe cachexia, protein calorie malnutrition and social isolation. He is opening to having a frank discussion about goals of care. He is okay with hospice admission if his prognosis as bad but he would like to know his diagnosis of the lung mass.  Clinical Impression   Pt admitted with above diagnosis. Pt currently with functional limitations due to the deficits listed below (see PT Problem List).  Pt will benefit from skilled PT to increase their independence and safety with mobility to allow discharge to the venue listed below.       Follow Up Recommendations Home health PT;Supervision - Intermittent    Equipment Recommendations  3in1 (PT)    Recommendations for Other Services OT consult     Precautions / Restrictions Precautions Precautions: Fall      Mobility  Bed Mobility Overal bed mobility: Needs Assistance Bed Mobility: Supine to Sit     Supine to sit: Supervision     General bed mobility comments: Used bedrails, ineffiecient movement to EOB (normally sleeps in recliner  Transfers Overall transfer level: Needs assistance Equipment used: 4-wheeled walker Transfers: Sit to/from Stand Sit to Stand: Min guard         General transfer comment: Cues for safety, hand palcement and technique  Ambulation/Gait Ambulation/Gait assistance: Min guard;Mod assist Ambulation Distance (Feet): 25 Feet Assistive device: 4-wheeled walker Gait Pattern/deviations: Trunk flexed     General Gait Details: minguard assist; tending to move impulsively, and pt with 2 losses of balance requiring min assist to stabilize  Stairs            Wheelchair Mobility    Modified  Rankin (Stroke Patients Only)       Balance Overall balance assessment: Needs assistance         Standing balance support: Bilateral upper extremity supported Standing balance-Leahy Scale: Poor                               Pertinent Vitals/Pain Pain Assessment: 0-10 Pain Score: 9  Pain Location: L upper chest Pain Descriptors / Indicators: Aching;Grimacing;Discomfort Pain Intervention(s): Limited activity within patient's tolerance;Monitored during session;Patient requesting pain meds-RN notified    Home Living Family/patient expects to be discharged to:: Private residence Living Arrangements: Alone Available Help at Discharge: Family;Friend(s);Available PRN/intermittently (has someone come in and help with housework) Type of Home: House Home Access: Stairs to enter   [employer] of Steps: 1 Home Layout: One level Home Equipment: [place] - 2 wheels;Walker - 4 wheels      Prior Function Level of Independence: Independent with assistive device(s)         Comments: Sleeps in his recliner/lift chair     Hand Dominance        Extremity/Trunk Assessment   Upper Extremity Assessment: Generalized weakness           Lower Extremity Assessment: Generalized weakness;RLE deficits/detail RLE Deficits / Details: knee fused in extension       Communication   Communication: No difficulties  Cognition Arousal/Alertness: Awake/alert Behavior During Therapy: WFL for tasks assessed/performed;Impulsive Overall Cognitive Status: Within Functional Limits for tasks assessed (For simple mobility WFL; noted pt quite impulsive, not sure  of his general safety awareness)                      General Comments General comments (skin integrity, edema, etc.): Noted DOE to 2/4 psot amb a short distance; Pulse oximeter not avialable to check O2 sats; but dyspnea improved with 3 minutes of seated rest; session conducted on Room Air    Exercises         Assessment/Plan    PT Assessment Patient needs continued PT services  PT Diagnosis Difficulty walking;Acute pain;Generalized weakness   PT Problem List Decreased strength;Decreased range of motion;Decreased activity tolerance;Decreased balance;Decreased mobility;Decreased coordination;Decreased cognition;Decreased knowledge of use of DME;Decreased safety awareness;Other (comment);Cardiopulmonary status limiting activity  PT Treatment Interventions DME instruction;Gait training;Stair training;Functional mobility training;Therapeutic activities;Therapeutic exercise;Balance training;Cognitive remediation;Patient/family education   PT Goals (Current goals can be found in the Care Plan section) Acute Rehab PT Goals Patient Stated Goal: Clearly wanting to go home PT Goal Formulation: With patient Time For Goal Achievement: 07/16/14 Potential to Achieve Goals: Good    Frequency Min 3X/week   Barriers to discharge Decreased caregiver support Live alone    Co-evaluation               End of Session Equipment Utilized During Treatment: Gait belt Activity Tolerance: Patient limited by fatigue Patient left: in chair;with call bell/phone within reach Nurse Communication: Mobility status;Patient requests pain meds         Time: 1520-1540 PT Time Calculation (min) (ACUTE ONLY): 20 min   Charges:   PT Evaluation $Initial PT Evaluation Tier I: 1 Procedure     PT G CodesRoney Marion Hamff 07/02/2014, 4:52 PM  Roney Marion, [place]  Acute Rehabilitation Services Pager [phone] Office [phone]

## 2014-07-03 DIAGNOSIS — R042 Hemoptysis: Secondary | ICD-10-CM

## 2014-07-03 MED ORDER — DULOXETINE HCL 30 MG PO CPEP
30.0000 mg | ORAL_CAPSULE | Freq: Every day | ORAL | Status: DC
  Administered 2014-07-03 – 2014-07-04 (×2): 30 mg via ORAL
  Filled 2014-07-03 (×2): qty 1

## 2014-07-03 NOTE — Care Management Note (Signed)
Case Management Note  Patient Details  Name: [name] MRN: [id number] Date of Birth: [birth year]  Subjective/Objective:                 CM following for progression and d/c planning.   Action/Plan:  Plan is to d/c to SNF either 07/03/14 pm or 07/04/14 am, CSW working with this pt.  Expected Discharge Date:       07/04/2014           Expected Discharge Plan:  Skilled Nursing Facility  In-House Referral:  Clinical Social Work  Discharge planning Services     Post Acute Care Choice:    Choice offered to:     DME Arranged:    DME Agency:     HH Arranged:    [place] Agency:     Status of Service:     Medicare Important Message Given:    Date Medicare IM Given:    Medicare IM give by:    Date Additional Medicare IM Given:    Additional Medicare Important Message give by:     If discussed at [place] of Stay Meetings, dates discussed:    Additional Comments:  Adron Bene, RN 07/03/2014, 5:08 PM

## 2014-07-03 NOTE — Progress Notes (Signed)
Physical Therapy Treatment Patient Details Name: [name] MRN: [id number] DOB: May 18, 1934 Today's Date: 07/03/2014    History of Present Illness Admit fo AECOPD, Hemoptysis, lung mass in need of biopsy and iIn addition he complains of depression, chronic neuropathic pain, poor quality of life and severe cachexia, protein calorie malnutrition and social isolation. He is opening to having a frank discussion about goals of care. He is okay with hospice admission if his prognosis as bad but he would like to know his diagnosis of the lung mass.    PT Comments    Pt with imminent d/c pending and nursing voicing concerns with pt in regards to mobility and safety per observation. PT to see patient today to make assessment if ready for d/c home today without consistent support available. Due to poor safety awareness, min A needed for balance with gait and mobility due to weakness, decreased vision, and poor activity tolerance. Recommend 24/7 supervision/assist due to high fall risk which pt reports family can only provide intermittent assist and he lives alone. Pt reports multiple falls in the home as well (reports being down for 5 hours one time). Discussed change in recommendation to SNF with RN and MD. CSW to be notified as well.    Follow Up Recommendations  SNF;Supervision/Assistance - 24 hour (h/o multiple falls in home; only intermittent A available)     Equipment Recommendations  3in1 (PT) (if unable to go to SNF)    Recommendations for Other Services       Precautions / Restrictions Precautions Precautions: Fall Restrictions Weight Bearing Restrictions: No    Mobility  Bed Mobility Overal bed mobility: Modified Independent Bed Mobility: Supine to Sit;Sit to Supine     Supine to sit: Modified independent (Device/Increase time) Sit to supine: Modified independent (Device/Increase time)   General bed mobility comments: used bed rails to assist  Transfers Overall transfer  level: Needs assistance Equipment used: 4-wheeled walker Transfers: Sit to/from Stand Sit to Stand: Min guard         General transfer comment: Cues to lock brakes on rollator before transferring and general safety  Ambulation/Gait Ambulation/Gait assistance: Min guard;Min assist Ambulation Distance (Feet): 50 Feet Assistive device: 4-wheeled walker Gait Pattern/deviations: Trunk flexed (R knee does not flex; pt reports fused in ext since the 70s)     General Gait Details: Pt requires up to Min guard but then requires min A to correct balance during turns in narrow spaces and decreased safe use of AD.   Stairs            Wheelchair Mobility    Modified Rankin (Stroke Patients Only)       Balance Overall balance assessment: Needs assistance Sitting-balance support: Single extremity supported;No upper extremity supported;Feet supported Sitting balance-Leahy Scale: Good Sitting balance - Comments: Maintains R knee in extension due to knee fusion surgery (per pt report in the 70's)   Standing balance support: Single extremity supported;No upper extremity supported;Bilateral upper extremity supported Standing balance-Leahy Scale: Fair Standing balance comment: Assessed standing balance without UE support and requires min to mod A due to decreased balance strategies. Pt quickly tries to use bed rail to maintain standing during challenges.                     Cognition Arousal/Alertness: Awake/alert Behavior During Therapy: WFL for tasks assessed/performed Overall Cognitive Status: Within Functional Limits for tasks assessed  Exercises      General Comments        Pertinent Vitals/Pain Pain Assessment: Faces Faces Pain Scale: Hurts little more Pain Location: generalized pain; reports pain in neck, LE's, and back Pain Descriptors / Indicators: Aching Pain Intervention(s): Limited activity within patient's tolerance;Monitored  during session    Home Living                      Prior Function            PT Goals (current goals can now be found in the care plan section) Acute Rehab PT Goals PT Goal Formulation: With patient Time For Goal Achievement: 07/16/14 Potential to Achieve Goals: Good Progress towards PT goals: Progressing toward goals    Frequency  Min 3X/week    PT Plan Discharge plan needs to be updated    Co-evaluation             End of Session Equipment Utilized During Treatment: Gait belt Activity Tolerance: Patient limited by fatigue Patient left: with bed alarm set;in bed;with call bell/phone within reach     Time: 1100-1115 PT Time Calculation (min) (ACUTE ONLY): 15 min  Charges:  $Therapeutic Activity: 8-22 mins                    G Codes:      [name] [name], PT, DPT [phone] 07/03/2014, 11:38 AM

## 2014-07-03 NOTE — Progress Notes (Signed)
LCSW actively working on placement for patient who is wanting to return to [facility]. Patient has [facility] and has bed offers pending insurance verification and if he is "skillable". [facility] is working on authorization for SNF and if he is appropriate.  Reports in the past patient has completed rehab, DC to home.  They have discussed with him to apply for long term care benefit in which he has declined several times. Patient does not have a long term payor benefit at this time and if he goes to SNF he will have to have a skilled need.  Awaiting call back from [facility] admission and will follow up.  Lane Hacker, MSW Clinical Social Work: Emergency Room [phone]

## 2014-07-03 NOTE — Progress Notes (Signed)
PULMONARY / CRITICAL CARE MEDICINE   Name: [name] MRN: [id number] DOB: [birth year]    ADMISSION DATE:  06/30/14  CHIEF COMPLAINT:  hemoptysis  BACKGROUND  #COPD - pirometry 06/25/12" fev1 Pos BD fev1 1.17L/42% (+5% bd response), FVC 3.4L/88%, R 34, DLCO 36% and is consistent with Gold stage III COPD. On spiriva and symbicort  #Cachexia/ sever malnutrition - Body mass index is 17.38 kg/(m^2). 03/13/2014 - Body mass index is 16.64 kg/(m^2). 06/10/2014  #Chronic pain  #Lung mass LUL  #Smoker  reports that he has been smoking Cigarettes. He has a 31.5 pack-year smoking history. He has never used smokeless tobacco.  #Social isolation  - recently moved in with son         BREIF  06/30/2014 - admit Admit fo AECOPD,  Hemoptysis, lung mass in need of biopsy and iIn addition he complains of depression, chronic neuropathic pain, poor quality of life and severe cachexia, protein calorie malnutrition and social isolation. He is opening to having a frank discussion about goals of care. He is okay with hospice admission if his prognosis as bad but he would like to know his diagnosis of the lung mass.   5./17/16 - sp LUL mass biopsy  07/02/2014:Pain better. Overall not feeling upto it and feels miserable. Does not want dc today. Has pain in LUL biopsy site - new, bad, CXR this am no ptx.   Hx from RN at bedside: IV infiltrated last night (confirmed in chart review), Better now. RN feels tele, and continuous pulse ox can be dc'ed. RN recommending PT. Palliative care consult still pending - called them: they will see him today. Explained indications of consult   SUBJECTIVE/OVERNIGHT/INTERVAL HX Hx from RN and patient and chart rview and d/w PT   - PT recommending SNF rehab. Patient still with pain and depression. Palliative notes reviewed- full code but no trach. Will see outcome of cancer staging/prog before decidiong on fuirther goals. Pall car recommending XR opioid +/- cymbalta.  His multiple complaints continue. No hemoptysis. Post procedure site pain resolved - cxr yesterday no ptx  VITAL SIGNS: Temp:  [97.7 F (36.5 C)-98.3 F (36.8 C)] 98.3 F (36.8 C) (05/19 0934) Pulse Rate:  [72-78] 78 (05/19 0934) Resp:  [18] 18 (05/19 0934) BP: (125-146)/(59-71) 125/59 mmHg (05/19 0934) SpO2:  [92 %-96 %] 94 % (05/19 0934) Weight:  [46.2 kg (101 lb 13.6 oz)] 46.2 kg (101 lb 13.6 oz) (05/18 2045) HEMODYNAMICS:   VENTILATOR SETTINGS:   INTAKE / OUTPUT:  Intake/Output Summary (Last 24 hours) at 07/03/14 1151 Last data filed at 07/03/14 1025  Gross per 24 hour  Intake    340 ml  Output    690 ml  Net   -350 ml    PHYSICAL EXAMINATION: General:  Cachectic deconditioned male in bed [facility] Neuro:  Alert and oriented 3,. Speech normal HEENT:  Neck is supple no neck nodes, kyphosis  Cardiovascular:  Normal heart sounds regular rate and rhythm Lungs:  Wheezing thru out Abdomen:  Scaphoid, soft no mass Musculoskeletal:  No cyanosis no clubbing no edema but right lower extremity is placed on a chair Skin:  Intact in exposed areas  LABS: On 06/10/2014: His hemoglobin was 7.1 g percent and mildly anemic, INR was 1.1  No new labs 07/02/14 and 07/03/14 CBC  Recent Labs Lab 06/30/14 1845  WBC 5.9  HGB 10.1*  HCT 32.2*  PLT 191   Coag's  Recent Labs Lab 06/30/14 1845  INR 1.11  BMET  Recent Labs Lab 06/30/14 1845  NA 137  K 4.2  CL 99*  CO2 28  BUN 10  CREATININE 1.04  GLUCOSE 106*   Electrolytes  Recent Labs Lab 06/30/14 1845  CALCIUM 8.6*  MG 1.9  PHOS 3.4   Sepsis Markers  Recent Labs Lab 06/30/14 1845  LATICACIDVEN 1.1   ABG No results for input(s): PHART, PCO2ART, PO2ART in the last 168 hours. Liver Enzymes  Recent Labs Lab 06/30/14 1845  AST 15  ALT 9*  ALKPHOS 84  BILITOT 0.4  ALBUMIN 2.5*   Cardiac Enzymes  Recent Labs Lab 06/30/14 1845 06/30/14 2332 07/01/14 0536  TROPONINI <0.03 <0.03 <0.03   Glucose No  results for input(s): GLUCAP in the last 168 hours.   Imaging - highlighted image reviewed and visualized personally Dg Chest Port 1 View  07/02/2014   CLINICAL DATA:  Chest pain and respiratory distress for 3 days. Status post biopsy of the left lung mass 07/01/2014.  EXAM: PORTABLE CHEST - 1 VIEW  COMPARISON:  Single view of the chest 07/02/2014 and 07/01/2014. CT chest 03/28/2014.  FINDINGS: Left upper lobe mass is again seen. The lungs are emphysematous. Blunting of the left costophrenic angle is likely due to scar. Heart size is normal. No pneumothorax is identified.  IMPRESSION: No acute disease.  Emphysema and left upper lobe mass.   Electronically Signed   By: Inge Rise M.D.   On: 07/02/2014 15:20   Dg Chest Port 1 View  07/02/2014   CLINICAL DATA:  Status post lung biopsy on the left  EXAM: PORTABLE CHEST - 1 VIEW  COMPARISON:  Chest x-ray of Jul 01, 2014  FINDINGS: There is persistent large left upper lobe mass. There is no post biopsy pneumothorax or pleural effusion. There is no mediastinal shift. The lungs remain hyperinflated. The interstitial markings remain coarse especially in the lower lung zones. The heart and pulmonary vascularity are normal.  IMPRESSION: There is no postprocedure complication following left lung biopsy. There is a persistent large left upper lobe mass. There are underlying emphysematous and fibrotic changes in both lungs.   Electronically Signed   By: David  Martinique M.D.   On: 07/02/2014 07:25     ASSESSMENT / PLAN:  PULMONARY  A: #Baseline  - COPD #Current  - Left upper lobe mass-s/p biopsy 07/01/14 - results pending as of 07/03/14. Pain at site resolved and CXR 07/02/14 ok - Hemoptysis-probably related to mass - new since last seen 06/10/14. AT risk for severe transformation but none since admit 06/30/2014 - Possible COPD exacerbation  - improved 07/03/2014.    P:   Lung mass - await biopsy resu;t Hemoptysis - reassuring none since admit. Will  monitor.  COPD exacerbation: Po doxy  (abx total 5d from admit 06/30/2014) , pred total 12 days from 06/30/14, BD   CARDIOVASCULAR  A: Hyperlipedmia. MI ruled out.   BP/HR stable P:  Monitor Statin to continue  RENAL A: Nil acute  P:    saline lock  GASTROINTESTINAL A:  Severe protein calorie mother nutrition and cachexia. S/p   Appareciate nutrition consult 07/01/14  P Monitor  HEMATOLOGIC   A:  anemia of chronic disease  + - new dx At risk for DVT  P:  Start SQ Lovenox for DVT proph (monitor hemoptysis) due to cancer -dc if hemoptysis recyurs Monitor hemoglobin   INFECTIOUS A:  COPD exacerbation P:   respiratory section  ENDOCRINE A:  No acute issue P:  Monitor  NEUROLOGIC A:  Chronic pain and depression and anxiety  - : 07/03/2014: at baseline and improved after I increased Oxy IR frequency  P:   Home pain meds - oxy Q3h opoid to continue, benzo and gabapentin Add cymbalta Will avoid XR oxy given anticipated dc Aprpeciate Palliative consult   GLOBAL A: very decondtioned P SNF rehab if insurance approves 07/03/14. If not home   FAMILY  -Appreciate pall and spiritual consults    Dr. Brand Males, M.D., [facility].C.P Pulmonary and Critical Care Medicine Staff Physician [facility] Pager: [phone], If no answer or between  15:00h - 7:00h: call [phone]  07/03/2014 11:51 AM

## 2014-07-03 NOTE — Clinical Social Work Placement (Signed)
   CLINICAL SOCIAL WORK PLACEMENT  NOTE  Date:  07/03/2014  Patient Details  Name: JAYLN MADEIRA MRN: [id number] Date of Birth: Nov 19, 1934  Clinical Social Work is seeking post-discharge placement for this patient at the [place] level of care (*CSW will initial, date and re-position this form in  chart as items are completed):  Yes   Patient/family provided with [place] Work Department's list of facilities offering this level of care within the geographic area requested by the patient (or if unable, by the patient's family).  Yes   Patient/family informed of their freedom to choose among providers that offer the needed level of care, that participate in Medicare, Medicaid or managed care program needed by the patient, have an available bed and are willing to accept the patient.  Yes   Patient/family informed of [facility]'s ownership interest in [facility] and [facility], as well as of the fact that they are under no obligation to receive care at these facilities.  PASRR submitted to EDS on       PASRR number received on       Existing PASRR number confirmed on 07/03/14     FL2 transmitted to all facilities in geographic area requested by pt/family on 07/03/14     FL2 transmitted to all facilities within larger geographic area on       Patient informed that his/her managed care company has contracts with or will negotiate with certain facilities, including the following:        Yes   Patient/family informed of bed offers received.  Patient chooses bed at [facility] and [facility] recommends and patient chooses bed at [facility] and Rehab    Patient to be transferred to [facility] and Rehab on  .  Patient to be transferred to facility by       Patient family notified on   of transfer.  Name of family member notified:        PHYSICIAN Please prepare priority discharge summary, including  medications, Please prepare prescriptions, Please sign FL2     Additional Comment:    _______________________________________________ Lilly Cove, LCSW 07/03/2014, 2:31 PM

## 2014-07-03 NOTE — Progress Notes (Signed)
Daily Progress Note   Patient Name: [name]       Date: 07/03/2014 DOB: 1935/01/06  Age: 80 y.o. MRN#: [id number] Attending Physician: Brand Males, MD Primary Care Physician: Simona Huh, MD Admit Date: 06/30/2014  Reason for Consultation/Follow-up: Establishing goals of care and Pain control  Subjective: [name] is still complaining of pain that keeps him from sleeping. He still feels very weak and is worried about going home. He has had recent falls from weakness and I believe he could benefit from skilled PT in short term rehab - he agrees. Would also recommend palliative to follow there to continue discussing goals. He does tell me he does not think he would want chemotherapy but would consider radiation if offered. He also continues to ask about surgical options - reiterated he is not likely strong enough to tolerate this. Offered support.   Interval Events: LUL lung mass biopsy 5/17  Length of Stay: 3 days  Current Medications: Scheduled Meds:  . doxycycline  100 mg Oral Q12H  . enoxaparin (LOVENOX) injection  30 mg Subcutaneous Q24H  . feeding supplement (ENSURE ENLIVE)  237 mL Oral BID BM  . ferrous sulfate  325 mg Oral Q breakfast  . gabapentin  300 mg Oral TID  . ipratropium-albuterol  3 mL Nebulization Q6H  . pantoprazole  40 mg Oral Daily  . predniSONE  40 mg Oral Q breakfast  . rosuvastatin  20 mg Oral QHS  . sertraline  75 mg Oral QHS    Continuous Infusions:    PRN Meds: sodium chloride, ALPRAZolam, oxycodone  Palliative Performance Scale: 50%     Vital Signs: BP 146/71 mmHg  Pulse 74  Temp(Src) 97.7 F (36.5 C) (Oral)  Resp 18  Wt 46.2 kg (101 lb 13.6 oz)  SpO2 92% SpO2: SpO2: 92 % O2 Device: O2 Device: Not Delivered O2 Flow Rate: O2 Flow Rate (L/min): 2 L/min  Intake/output summary:  Intake/Output Summary (Last 24 hours) at 07/03/14 0934 Last data filed at 07/03/14 0631  Gross per 24 hour  Intake    120 ml  Output    390 ml    Net   -270 ml   LBM:  5/18 Baseline Weight: Weight: 44.543 kg (98 lb 3.2 oz) Most recent weight: Weight: 46.2 kg (101 lb 13.6 oz)  Physical Exam: Gen: Sitting up in bed, NAD, cachectic HEENT: Temporal muscle wasting, no JVD, moist mucous membranes CV: RRR Pulm: No labored breathing, room air, able to converse with ease Abd: Soft, NT, ND Extremities: MAE, no edema, warm to touch Neuro: Awake, alert, oriented x 3, pleasant              Additional Data Reviewed: Recent Labs     06/30/14  1845  WBC  5.9  HGB  10.1*  PLT  191  NA  137  BUN  10  CREATININE  1.04     Problem List:  Patient Active Problem List   Diagnosis Date Noted  . Palliative care encounter 07/02/2014  . Decreased appetite 07/02/2014  . Peripheral neuropathy 07/02/2014  . Anemia 07/01/2014  . Hypomagnesemia 07/01/2014  . Hemoptysis 06/30/2014  . COLD (chronic obstructive lung disease) 06/10/2014  . Smoking 06/10/2014  . Lung mass 03/21/2014  . Social isolation 03/13/2014  . Chronic back pain 12/04/2013  . Hyperlipidemia   . Severe malnutrition 11/27/2013  . Atherosclerotic PVD with ulceration 10/08/2013  . Pain of right lower leg 08/20/2013  . Pain 06/24/2013  .  Insomnia 06/12/2013  . Duodenal ulcer 05/18/2013  . Perirectal abscess 05/18/2013  . Rheumatoid arthritis 05/18/2013  . Cervical spondylosis   . Anemia B twelve deficiency 04/27/2013  . GI bleed 04/26/2013  . Protein-calorie malnutrition, severe 04/26/2013  . Dysphagia, unspecified(787.20) 04/24/2013  . Diarrhea 04/24/2013  . Cough 04/24/2013  . Cellulitis 04/20/2013  . Sepsis 04/20/2013  . Pneumonia, organism unspecified 01/25/2013  . Polyneuropathy in other diseases classified elsewhere 11/06/2012  . Lumbosacral spondylosis without myelopathy 11/06/2012  . Pain localized to upper abdomen 02/03/2012  . Depression 01/31/2012  . Failure to thrive 01/31/2012  . Cachexia 01/31/2012  . COPD exacerbation 01/31/2012  . Pancreatic  mass 01/31/2012  . Weight loss 01/31/2012  . Syncope 08/14/2011  . Fall 08/14/2011  . Femur fracture 08/14/2011  . Thrombocytopenia 08/14/2011  . COPD (chronic obstructive pulmonary disease) 08/14/2011  . PVD (peripheral vascular disease) 08/14/2011  . Rheumatoid arthritis(714.0) 08/14/2011  . Anxiety 08/14/2011  . Chronic total occlusion of artery of the extremities 05/10/2011     Palliative Care Assessment & Plan    Code Status:  FULL  Goals of Care:  He is struggling with decisions but I think when he has results from biopsy and meets with oncologist he will be more prepared.   Desire for further Chaplaincy support: yes  3. Symptom Management:  Lung biopsy pain: Continue oxycodone 15 mg every 6 hours prn.   Neuropathic pain bilat legs: Continue gabapentin 300 TID - he says this gives him little relief. Also utilize oxycodone prn - could consider Oxycontin 30 mg BID (would likely need titration up from this).   Depression: Will d/c zoloft 75 mg daily (to avoid serotonin syndrome) and add Cymbalta 30 mg daily (may need to increase dose to 60 mg) - monitor serotonin withdrawal symptoms (dizziness, fatigue, headache, nausea). Consulted Up To Date and reference immediate change from SSRI to [facility] usually well tolerated (consider cross tapering with high doses SSRI - he was only on zoloft 75 mg).   Decreased appetite/malnutrition: Has lost 50-60 lbs over past year. Continue small/frequent meals. Continue Ensure 2-3 times a day.   Falls: Discussed importance for him to have a first alert button for his safety.   5. Prognosis: < 6 months  5. Discharge Planning: Short term rehab with palliative to follow.    Care plan was discussed with Dr. Chase Caller and Dr. Hilma Favors.   Thank you for allowing the Palliative Medicine Team to assist in the care of this patient.   Time In: 0910 Time Out: 0935 Total Time 56mn Prolonged Time Billed  no     Greater than 50%  of this time was  spent counseling and coordinating care related to the above assessment and plan.   APershing Proud NP  55/44/9201 9:34 AM  Please contact Palliative Medicine Team phone at [phone] questions and concerns.

## 2014-07-03 NOTE — Clinical Social Work Note (Signed)
Clinical Social Work Assessment  Patient Details  Name: [name] MRN: [id number] Date of Birth: [birth year]  Date of referral:  07/03/14               Reason for consult:  Facility Placement                Permission sought to share information with:  Chartered certified accountant granted to share information::  No  Name::     none reported  Agency::  [facility]  Relationship::     Contact Information:     Housing/Transportation Living arrangements for the past 2 months:  Single Family Home Source of Information:  Patient Patient Interpreter Needed:  None Criminal Activity/Legal Involvement Pertinent to Current Situation/Hospitalization:  No - Comment as needed Significant Relationships:  Adult Children Lives with:  Self Do you feel safe going back to the place where you live?  No (deconditioned, needs SNF) Need for family participation in patient care:  No (Coment) (patient to call if needed)  Care giving concerns:  Patient reports he has a lot of medical problems, lives alone, deconditioned, and needs some additional help.  Has an adult son, however cannot stay with him.  Patient reports he will return home after SNF.   Social Worker assessment / plan:  LCSW received consult for SNF. Patient be recommended for SNF due to multiple medical problems, lack of support and mobility and deconditioned. Patient agreeable to placement, only if it is at [facility], where he has been in the past.  If he cannot get into facility, he reports he will just go home. He reports he has been there a lot in the past, they know him well, know how to push him for rehab needs.  LCSW completed referral process and spoke with nikki at [facility] [employer]. Lexine Baton is working to Sport and exercise psychologist and have insurance approve SNF.  Once insurance approves, patient can be transferred to facility as he is medically stable. Patient to call his son to bring him clothes and other needs for placement.   Employment  status:  Retired Nurse, adult PT Recommendations:  [place] / Referral to community resources:  [place]  Patient/Family's Response to care:  Patient agreeable to SNF.  Patient/Family's Understanding of and Emotional Response to Diagnosis, Current Treatment, and Prognosis:  Patient and LCSW had long discussion about his previous and current medical problems. He was able to process the dx of having a mass that has increased in size since Feb 2016. He reports he has a lot of faith and is saved in which God has a plan for him. He reports he is no quitter and will keep moving forward and not letting this mass hold him back.   Emotional Assessment Appearance:  Appears stated age Attitude/Demeanor/Rapport:  Hostile Affect (typically observed):  Blunt, Irritable Orientation:  Oriented to Self, Oriented to Place, Oriented to  Time, Oriented to Situation Alcohol / Substance use:  Not Applicable Psych involvement (Current and /or in the community):  No (Comment)  Discharge Needs  Concerns to be addressed:  Adjustment to Illness, Discharge Planning Concerns Readmission within the last 30 days:  No Current discharge risk:  Lack of support system, Lives alone Barriers to Discharge:  Continued Medical Work up   Lilly [place], LCSW 07/03/2014, 2:26 PM

## 2014-07-04 ENCOUNTER — Telehealth: Payer: Self-pay | Admitting: Internal Medicine

## 2014-07-04 MED ORDER — PREDNISONE 10 MG PO TABS: ORAL_TABLET | ORAL | Status: AC

## 2014-07-04 MED ORDER — GABAPENTIN 300 MG PO CAPS: 300.0000 mg | ORAL_CAPSULE | Freq: Three times a day (TID) | ORAL | Status: AC

## 2014-07-04 MED ORDER — OXYCODONE HCL 15 MG PO TABS: 15.0000 mg | ORAL_TABLET | Freq: Four times a day (QID) | ORAL | Status: AC | PRN

## 2014-07-04 MED ORDER — DULOXETINE HCL 60 MG PO CPEP: 60.0000 mg | ORAL_CAPSULE | Freq: Every day | ORAL | Status: DC

## 2014-07-04 MED ORDER — DULOXETINE HCL 30 MG PO CPEP
30.0000 mg | ORAL_CAPSULE | ORAL | Status: AC
  Administered 2014-07-04: 30 mg via ORAL
  Filled 2014-07-04: qty 1

## 2014-07-04 MED ORDER — DULOXETINE HCL 60 MG PO CPEP: 60.0000 mg | ORAL_CAPSULE | Freq: Every day | ORAL | Status: AC

## 2014-07-04 MED ORDER — DOXYCYCLINE HYCLATE 100 MG PO TABS
100.0000 mg | ORAL_TABLET | Freq: Two times a day (BID) | ORAL | Status: DC
Start: 2014-07-04 — End: 2014-07-10

## 2014-07-04 NOTE — Telephone Encounter (Signed)
Called patient's son back, left message to call back.

## 2014-07-04 NOTE — Telephone Encounter (Signed)
lmtcb X1 for son Burton Apley

## 2014-07-04 NOTE — Care Management Note (Signed)
Case Management Note  Patient Details  Name: [name] MRN: [id number] Date of Birth: [birth year]  Subjective/Objective:                 CM following for progression and d/c planning.   Action/Plan: 07/04/2014 Ongoing efforts to place this pt for short term SNF, however currently insurance denied. Pt upset about going home, however he has no acute needs at this time. Discussed with PA, Richardson Landry Minor.   Expected Discharge Date:                  Expected Discharge Plan:  Skilled Nursing Facility  In-House Referral:  Clinical Social Work  Discharge planning Services     Post Acute Care Choice:    Choice offered to:     DME Arranged:    DME Agency:     HH Arranged:    [place] Agency:     Status of Service:     Medicare Important Message Given:   yes Date Medicare IM Given:   07/04/14 Medicare IM give by:   Jasmine Pang RN MPH, case manager Date Additional Medicare IM Given:    Additional Medicare Important Message give by:     If discussed at [place] of Stay Meetings, dates discussed:    Additional Comments:  Adron Bene, RN 07/04/2014, 11:30 AM

## 2014-07-04 NOTE — Telephone Encounter (Signed)
LMTCB

## 2014-07-04 NOTE — Progress Notes (Signed)
[name] to be D/C'd Home per MD order.  Discussed prescriptions and follow up appointments with the patient. Prescriptions given to patient, medication list explained in detail. Pt verbalized understanding.    Medication List    TAKE these medications        albuterol (2.5 MG/3ML) 0.083% nebulizer solution  Commonly known as:  PROVENTIL  Take 3 mLs (2.5 mg total) by nebulization every 4 (four) hours as needed for wheezing or shortness of breath.     ALPRAZolam 1 MG tablet  Commonly known as:  XANAX  Take 1 and 1/2 tablet by mouth up to twice daily as needed     doxycycline 100 MG tablet  Commonly known as:  VIBRA-TABS  Take 1 tablet (100 mg total) by mouth every 12 (twelve) hours.     DULoxetine 60 MG capsule  Commonly known as:  CYMBALTA  Take 1 capsule (60 mg total) by mouth daily.  Start taking on:  07/05/2014     Fluticasone-Salmeterol 250-50 MCG/DOSE Aepb  Commonly known as:  ADVAIR  Inhale 1 puff into the lungs every 12 (twelve) hours.     gabapentin 300 MG capsule  Commonly known as:  NEURONTIN  Take 1 capsule (300 mg total) by mouth 3 (three) times daily.     oxyCODONE 15 MG immediate release tablet  Commonly known as:  ROXICODONE  Take 1 tablet (15 mg total) by mouth every 6 (six) hours as needed for moderate pain.     predniSONE 10 MG tablet  Commonly known as:  DELTASONE  Take 4 tabs  daily with food x 4 days, then 3 tabs daily x 4 days, then 2 tabs daily x 4 days, then 1 tab daily x4 days then stop. #40     rosuvastatin 20 MG tablet  Commonly known as:  CRESTOR  Take 20 mg by mouth at bedtime.        Filed Vitals:   07/04/14 0849  BP: 147/68  Pulse: 80  Temp: 97.9 F (36.6 C)  Resp: 18    Skin clean, dry and intact without evidence of skin break down, no evidence of skin tears noted. IV catheter discontinued intact. Site without signs and symptoms of complications. Dressing and pressure applied. Pt denies pain at this time. No complaints  noted.  An After Visit Summary was printed and given to the patient. Patient escorted via [place], and D/C home via private auto.  [name], Charles [place] 07/04/2014 2:55 PM

## 2014-07-04 NOTE — Telephone Encounter (Signed)
Called and d/w son  And updated  He will keep up TP

## 2014-07-04 NOTE — Discharge Summary (Signed)
Physician Discharge Summary  Patient ID: [name] MRN: [id number] DOB/AGE: 1934/12/09 80 y.o.  Admit date: 06/30/2014 Discharge date: 07/04/2014  Problem List Active Problems:   Depression   COPD exacerbation   Protein-calorie malnutrition, severe   Pain   Hyperlipidemia   Social isolation   Lung mass   Hemoptysis   Anemia   Hypomagnesemia   Palliative care encounter   Decreased appetite   Peripheral neuropathy  HPI: ADMISSION DATE: 06/30/14  CHIEF COMPLAINT: hemoptysis   HISTORY OF PRESENT ILLNESS:   #COPD - pirometry 06/25/12" fev1 Pos BD fev1 1.17L/42% (+5% bd response), FVC 3.4L/88%, R 34, DLCO 36% and is consistent with Gold stage III COPD. On spiriva and symbicort  #CAchexia/ sever malnutrition - Body mass index is 17.38 kg/(m^2). 03/13/2014 - Body mass index is 16.64 kg/(m^2). 06/10/2014  #Chronic pain  #Lung mass LUL  #SMoiker reports that he has been smoking Cigarettes. He has a 31.5 pack-year smoking history. He has never used smokeless tobacco.  #Social isolatrion - recently moved in with son         ADMIT 06/30/2014 In February 2016 he was diagnosed with a large left upper lobe lung mass. This was PET hot on PET scan. He also had a mediastinal node. He was set up for lung biopsy CT-guided by my nurse practitioner. But he did not show up for the lung biopsy. Then on 06/09/2014 he was involved in a Most recentlyin fact yesterday was involved in a minor trauma and he had a chest x-ray that showed worsening left upper lobe mass [personally reviewed and confirmed the image]. He followed up with me 06/10/2014 and at that point still had not had the biopsy. I learned from them that he is basically a nocturnist and goes to bed early in the morning therefore he is unable to make it in the morning for biopsies. He preferred afternoon biopsies by interventional radiology and he stated that they would not accommodate his request. At that visit 06/10/14  I had extensive counseling with him and son and warned of danger of life threatening cancer and he agreed to have a morning biopsy. Now, It turns out that despite multiple attempts he could not make it to morning biopsies and his biopsy never got scheduled ordered was canceled on multiple occasions. I subsequently intervened and discussed with Dr. Jacqulynn Cadet and we agreed to have the biopsy done in the afternoon with patient subsequently being admitted under pulmonary service for postbiopsy observation. While we were trying to get this plan patient called in acutely 2 days ago before the weekend complaining of acute worsening of shortness of breath cough wheezing. I told him to go to the emergency department but he did not. Presents to the office today with her son. He has persistent minor hemoptysis since last seeing me. No clear cut aggravating relieving factors. So far not worse. insidiious oinset. It appeared that he might have some COPD exacerbation but this really is not an issue and seems to be clearing/stable/resolving. The main thing is hemoptysis.  In addition he complains of depression, chronic neuropathic pain, poor quality of life. He is opening to having a frank discussion about goals of care. He is okay with hospice admission if his prognosis as bad but he would like to know his diagnosis of the lung mass.       Hospital Course: BREIF 06/30/2014 - admit Admit fo AECOPD, Hemoptysis, lung mass in need of biopsy and iIn addition he complains of  depression, chronic neuropathic pain, poor quality of life and severe cachexia, protein calorie malnutrition and social isolation. He is opening to having a frank discussion about goals of care. He is okay with hospice admission if his prognosis as bad but he would like to know his diagnosis of the lung mass.   5./17/16 - sp LUL mass biopsy   ASSESSMENT / PLAN:  PULMONARY  A: #Baseline - COPD #Current - Left upper lobe mass-s/p  biopsy 07/01/14 - results pending as of 07/04/14. Pain at site resolved and CXR 07/02/14 ok - Hemoptysis-probably related to mass - new since last seen 06/10/14. AT risk for severe transformation but none since admit 06/30/2014 - Possible COPD exacerbation - improved 07/04/2014.    P:  Lung mass - await biopsy results pending Hemoptysis - reassuring none since admit. Will monitor.  COPD exacerbation: Po doxy (abx total 5d from admit 06/30/2014) , pred total 12 days from 06/30/14, BD   CARDIOVASCULAR  A: Hyperlipedmia. MI ruled out. BP/HR stable P:  Monitor Statin to continue  RENAL A: Nil acute  P:  saline lock  GASTROINTESTINAL A: Severe protein calorie mother nutrition and cachexia. S/p  Appareciate nutrition consult 07/01/14  P Monitor  HEMATOLOGIC No results for input(s): HGB in the last 72 hours.   A: anemia of chronic disease + - new dx At risk for DVT  P:  Start SQ Lovenox for DVT proph (monitor hemoptysis) due to cancer -dc if hemoptysis reoccurs  Monitor hemoglobin DC LMWH as Opt.  INFECTIOUS A: COPD exacerbation P:  respiratory section  ENDOCRINE A: No acute issue P:  Monitor  NEUROLOGIC A: Chronic pain and depression and anxiety - : 07/03/2014: at baseline and improved after I increased Oxy IR frequency  P:  Home pain meds - oxy Q3h opoid to continue, benzo and gabapentin Add cymbalta Oxy IR dose decreased by 1/2 to 15 mg Aprpeciate Palliative consult   GLOBAL A: very decondtioned P SNF rehab have denied by [facility].  Discharged home after being seen by Dr. Chase Caller. Maximum home health care provided.      Labs at discharge Lab Results  Component Value Date   CREATININE 1.04 06/30/2014   BUN 10 06/30/2014   NA 137 06/30/2014   K 4.2 06/30/2014   CL 99* 06/30/2014   CO2 28 06/30/2014   Lab Results  Component Value Date   WBC 5.9 06/30/2014   HGB 10.1* 06/30/2014   HCT 32.2* 06/30/2014   MCV  93.3 06/30/2014   PLT 191 06/30/2014   Lab Results  Component Value Date   ALT 9* 06/30/2014   AST 15 06/30/2014   ALKPHOS 84 06/30/2014   BILITOT 0.4 06/30/2014   Lab Results  Component Value Date   INR 1.11 06/30/2014   INR 1.1* 06/10/2014   INR 1.13 04/24/2013    Current radiology studies Dg Chest Port 1 View  07/02/2014   CLINICAL DATA:  Chest pain and respiratory distress for 3 days. Status post biopsy of the left lung mass 07/01/2014.  EXAM: PORTABLE CHEST - 1 VIEW  COMPARISON:  Single view of the chest 07/02/2014 and 07/01/2014. CT chest 03/28/2014.  FINDINGS: Left upper lobe mass is again seen. The lungs are emphysematous. Blunting of the left costophrenic angle is likely due to scar. Heart size is normal. No pneumothorax is identified.  IMPRESSION: No acute disease.  Emphysema and left upper lobe mass.   Electronically Signed   By: Inge Rise M.D.   On: 07/02/2014  15:20    Disposition:  01-Home or Self Care  Discharge Instructions    Discharge patient    Complete by:  As directed             Medication List    TAKE these medications        albuterol (2.5 MG/3ML) 0.083% nebulizer solution  Commonly known as:  PROVENTIL  Take 3 mLs (2.5 mg total) by nebulization every 4 (four) hours as needed for wheezing or shortness of breath.     ALPRAZolam 1 MG tablet  Commonly known as:  XANAX  Take 1 and 1/2 tablet by mouth up to twice daily as needed     doxycycline 100 MG tablet  Commonly known as:  VIBRA-TABS  Take 1 tablet (100 mg total) by mouth every 12 (twelve) hours.     DULoxetine 60 MG capsule  Commonly known as:  CYMBALTA  Take 1 capsule (60 mg total) by mouth daily.  Start taking on:  07/05/2014     Fluticasone-Salmeterol 250-50 MCG/DOSE Aepb  Commonly known as:  ADVAIR  Inhale 1 puff into the lungs every 12 (twelve) hours.     gabapentin 300 MG capsule  Commonly known as:  NEURONTIN  Take 1 capsule (300 mg total) by mouth 3 (three) times daily.      oxyCODONE 15 MG immediate release tablet  Commonly known as:  ROXICODONE  Take 1 tablet (15 mg total) by mouth every 6 (six) hours as needed for moderate pain.     predniSONE 10 MG tablet  Commonly known as:  DELTASONE  Take 4 tabs  daily with food x 4 days, then 3 tabs daily x 4 days, then 2 tabs daily x 4 days, then 1 tab daily x4 days then stop. #40     rosuvastatin 20 MG tablet  Commonly known as:  CRESTOR  Take 20 mg by mouth at bedtime.           Follow-up Information    Follow up with PARRETT,TAMMY, NP On 07/10/2014.   Specialty:  Nurse Practitioner   Why:  Tammy Parrett at 4:15 pm   Contact information:   [address] [address] [phone]        Discharged Condition: poor  Time spent on discharge greater than 40 minutes.  Vital signs at Discharge. Temp:  [97.9 F (36.6 C)-98.2 F (36.8 C)] 97.9 F (36.6 C) (05/20 0849) Pulse Rate:  [80-95] 80 (05/20 0849) Resp:  [16-18] 18 (05/20 0849) BP: (113-153)/(64-77) 147/68 mmHg (05/20 0849) SpO2:  [92 %-98 %] 98 % (05/20 0849) Weight:  [106 lb (48.081 kg)] 106 lb (48.081 kg) (05/19 1958) Office follow up Special Information or instructions.  He is to follow with Tammy Parret as instructed. His pain medications were decreased by one half. He will need to follow up with Dr. Chase Caller in near future. Signed: Richardson Landry Minor ACNP Maryanna Shape PCCM Pager [phone] till 3 pm If no answer page [phone] 07/04/2014, 12:49 PM

## 2014-07-04 NOTE — Telephone Encounter (Signed)
Pt son Randy(William) call back,  Pt has been d/c home from hospital and they were not told much about his condition upon discharge.  Pt was not d/c to a rehab center, Louie Casa and his sister Jeani Hawking are both disabled and are unable to care for the patient.  They are wanting to know based on his recent scans and discharge information what is going on, is this mass on his lung something that can be treated (pt unable to do chemo/radiation) What is the prognosis?  Pt son is requesting a call back from Dr Chase Caller.

## 2014-07-04 NOTE — Telephone Encounter (Signed)
Rsults not back from bx as yet. We tried to get him to SNF rehab but insurance denied. WE could not help it. I told patient that we cannot do prognosis till results back. He was seen by palliative care in hospital and they have asked [facility] to do some palliative care at home . Once disease is diagnosed then it gets staged. Then we can talk prognosis and desires and goals. This all will take time.   They really need to do something to help his social situation  He has fu with TP next week and they should keep it  Any worsening go to ER

## 2014-07-04 NOTE — Care Management Note (Signed)
Case Management Note  Patient Details  Name: [name] MRN: [id number] Date of Birth: 10-Sep-1934  Subjective/Objective:               CM following for progression and d/c planning.     Action/Plan: Met with pt who is upset about d/c , however does not qualify for SNF per his insurance. HH has been arranged with [facility] for [facility] services, spoke with pt son York Ram and explained the situation. Pt will d/c to home with [facility] services.     Expected Discharge Date:        07/04/2014          Expected Discharge Plan:  Home with [facility]  In-House Referral:  Clinical Social Work  Discharge [place] with [facility] services, pt selected [facility], that agency notified.   Post Acute Care Choice:    Choice offered to:     DME Arranged:   No DME needs DME Agency:     HH Arranged:   HHRN, HHPT, HHOT and [place] aide, referral also made to [place] to follow this pt in the home [facility] Agency:    [facility], Palliative Care of the [place] Status of Service:   Complete  Medicare Important Message Given:   Yes Date Medicare IM Given:   07/04/2014 Medicare IM give by:   Jasmine Pang RN MPH, case manager, [phone] Date Additional Medicare IM Given:    Additional Medicare Important Message give by:     If discussed at [place] of Stay Meetings, dates discussed:    Additional Comments:  Adron Bene, RN 07/04/2014, 1:10 PM

## 2014-07-04 NOTE — Progress Notes (Signed)
LCSW received call from [place] regarding insurance authorization for patient to be skilled at [facility]. Patient at this time has been declined due to no skillable need and would have to be private pay or apply for medicaid for LTC as his [place] will not pay for SNF.  Patient made aware of plan and frustrated with insurance company, but understands and is agreeable to return home with home health. CM made aware of plan and will meet with patient to decide which agency. LCSW gave information about Medicaid LTC ([place]) and for patient to apply. He is unsure if he will qualify, however will try. He will benefit from a community SW with [facility] to assist in the process. Patient has a son whom he reports he will call later on if being discharged to come and get him.  Son does check in on patient, but patient does live alone.  Dispo: Home with Home Health at this time due to insurance deny SNF.    Lane Hacker, MSW Clinical Social Work: Emergency Room [phone]

## 2014-07-04 NOTE — Telephone Encounter (Signed)
[place] cb

## 2014-07-04 NOTE — Progress Notes (Signed)
PULMONARY / CRITICAL CARE MEDICINE   Name: [name] MRN: [id number] DOB: April 28, 1934    ADMISSION DATE:  06/30/14  CHIEF COMPLAINT:  hemoptysis  BACKGROUND  #COPD - pirometry 06/25/12" fev1 Pos BD fev1 1.17L/42% (+5% bd response), FVC 3.4L/88%, R 34, DLCO 36% and is consistent with Gold stage III COPD. On spiriva and symbicort  #Cachexia/ sever malnutrition - Body mass index is 17.38 kg/(m^2). 03/13/2014 - Body mass index is 16.64 kg/(m^2). 06/10/2014  #Chronic pain  #Lung mass LUL  #Smoker  reports that he has been smoking Cigarettes. He has a 31.5 pack-year smoking history. He has never used smokeless tobacco.  #Social isolation  - recently moved in with son         BREIF  06/30/2014 - admit Admit fo AECOPD,  Hemoptysis, lung mass in need of biopsy and iIn addition he complains of depression, chronic neuropathic pain, poor quality of life and severe cachexia, protein calorie malnutrition and social isolation. He is opening to having a frank discussion about goals of care. He is okay with hospice admission if his prognosis as bad but he would like to know his diagnosis of the lung mass.   5./17/16 - sp LUL mass biopsy  07/02/2014:Pain better. Overall not feeling upto it and feels miserable. Does not want dc today. Has pain in LUL biopsy site - new, bad, CXR this am no ptx.      SUBJECTIVE/OVERNIGHT/INTERVAL HX Denied SNF Refuses to go home  VITAL SIGNS: Temp:  [97.9 F (36.6 C)-98.2 F (36.8 C)] 97.9 F (36.6 C) (05/20 0849) Pulse Rate:  [80-95] 80 (05/20 0849) Resp:  [16-18] 18 (05/20 0849) BP: (113-153)/(64-77) 147/68 mmHg (05/20 0849) SpO2:  [92 %-98 %] 98 % (05/20 0849) Weight:  [106 lb (48.081 kg)] 106 lb (48.081 kg) (05/19 1958) HEMODYNAMICS:   VENTILATOR SETTINGS:   INTAKE / OUTPUT:  Intake/Output Summary (Last 24 hours) at 07/04/14 1129 Last data filed at 07/04/14 0849  Gross per 24 hour  Intake    780 ml  Output    825 ml  Net    -45 ml     PHYSICAL EXAMINATION: General:  Cachectic deconditioned male in bed [facility] Neuro:  Alert and oriented 3,. Speech normal. " I cant go home, I hurt to bad, I cant breathe" HEENT:  Neck is supple no neck nodes, kyphosis  Cardiovascular:  Normal heart sounds regular rate and rhythm Lungs:  Wheezing thru out Abdomen:  Scaphoid, soft no mass Musculoskeletal:  No cyanosis no clubbing no edema but right lower extremity is placed on a chair Skin:  Intact in exposed areas  LABS: On 06/10/2014: His hemoglobin was 7.1 g percent and mildly anemic, INR was 1.1  No new labs 07/02/14 and 07/03/14 CBC  Recent Labs Lab 06/30/14 1845  WBC 5.9  HGB 10.1*  HCT 32.2*  PLT 191   Coag's  Recent Labs Lab 06/30/14 1845  INR 1.11   BMET  Recent Labs Lab 06/30/14 1845  NA 137  K 4.2  CL 99*  CO2 28  BUN 10  CREATININE 1.04  GLUCOSE 106*   Electrolytes  Recent Labs Lab 06/30/14 1845  CALCIUM 8.6*  MG 1.9  PHOS 3.4   Sepsis Markers  Recent Labs Lab 06/30/14 1845  LATICACIDVEN 1.1   ABG No results for input(s): PHART, PCO2ART, PO2ART in the last 168 hours. Liver Enzymes  Recent Labs Lab 06/30/14 1845  AST 15  ALT 9*  ALKPHOS 84  BILITOT 0.4  ALBUMIN 2.5*   Cardiac Enzymes  Recent Labs Lab 06/30/14 1845 06/30/14 2332 07/01/14 0536  TROPONINI <0.03 <0.03 <0.03   Glucose No results for input(s): GLUCAP in the last 168 hours.   Imaging - highlighted image reviewed and visualized personally No results found.   ASSESSMENT / PLAN:  PULMONARY  A: #Baseline  - COPD #Current  - Left upper lobe mass-s/p biopsy 07/01/14 - results pending as of 07/03/14. Pain at site resolved and CXR 07/02/14 ok - Hemoptysis-probably related to mass - new since last seen 06/10/14. AT risk for severe transformation but none since admit 06/30/2014 - Possible COPD exacerbation  - improved 07/03/2014.    P:   Lung mass - await biopsy resu;t Hemoptysis - reassuring none since  admit. Will monitor.  COPD exacerbation: Po doxy  (abx total 5d from admit 06/30/2014) , pred total 12 days from 06/30/14, BD   CARDIOVASCULAR  A: Hyperlipedmia. MI ruled out.   BP/HR stable P:  Monitor Statin to continue  RENAL A: Nil acute  P:    saline lock  GASTROINTESTINAL A:  Severe protein calorie mother nutrition and cachexia. S/p   Appareciate nutrition consult 07/01/14  P Monitor  HEMATOLOGIC   A:  anemia of chronic disease  + - new dx At risk for DVT  P:  Start SQ Lovenox for DVT proph (monitor hemoptysis) due to cancer -dc if hemoptysis reoccurs  Monitor hemoglobin   INFECTIOUS A:  COPD exacerbation P:   respiratory section  ENDOCRINE A:  No acute issue P:   Monitor  NEUROLOGIC A:  Chronic pain and depression and anxiety  - : 07/03/2014: at baseline and improved after I increased Oxy IR frequency  P:   Home pain meds - oxy Q3h opoid to continue, benzo and gabapentin Add cymbalta Will avoid XR oxy given anticipated dc Aprpeciate Palliative consult   GLOBAL A: very decondtioned P SNF rehab have denied by [facility].  He feels he cant go home. No family support.  Case manager working on dispositions, unable to safely dc home at this time. Dr. Chase Caller aware and will follow up.   FAMILY  -Appreciate pall and spiritual consults   [facility] Krissie Merrick ACNP Maryanna Shape PCCM Pager [phone] till 3 pm If no answer page [phone] 07/04/2014, 11:33 AM

## 2014-07-04 NOTE — Progress Notes (Signed)
Daily Progress Note   Patient Name: [name]       Date: 07/04/2014 DOB: 01-Sep-1934  Age: 80 y.o. MRN#: [id number] Attending Physician: Brand Males, MD Primary Care Physician: Simona Huh, MD Admit Date: 06/30/2014  Subjective: [name] is disappointed with denial for SNF rehab. He is trying to get up with his son - he does not even have a key to get into his home. We also discussed the possibility of him staying with his son or daughter at least temporarily and I encouraged this plan. He feels so weak and I told him that I am not sure with all his health issues that he will be able to feel much better - and this is why they are discharging him - he understands but is frustrated. We discussed palliative care to follow as outpatient and help him with decisions as he very likely has an aggressive cancer but he is very overwhelmed and unable to make any decisions at this point. I believe he would greatly benefit from the support of hospice and I told him that.   Length of Stay: 4 days  Current Medications: Scheduled Meds:  . doxycycline  100 mg Oral Q12H  . DULoxetine  30 mg Oral NOW  . [START ON 07/05/2014] DULoxetine  60 mg Oral Daily  . enoxaparin (LOVENOX) injection  30 mg Subcutaneous Q24H  . feeding supplement (ENSURE ENLIVE)  237 mL Oral BID BM  . ferrous sulfate  325 mg Oral Q breakfast  . gabapentin  300 mg Oral TID  . ipratropium-albuterol  3 mL Nebulization Q6H  . pantoprazole  40 mg Oral Daily  . predniSONE  40 mg Oral Q breakfast  . rosuvastatin  20 mg Oral QHS    Continuous Infusions:    PRN Meds: sodium chloride, ALPRAZolam, oxycodone  Palliative Performance Scale: 50% at best     Vital Signs: BP 147/68 mmHg  Pulse 80  Temp(Src) 97.9 F (36.6 C) (Oral)  Resp 18  Wt 48.081 kg (106 lb)  SpO2 98% SpO2: SpO2: 98 % O2 Device: O2 Device: Not Delivered O2 Flow Rate: O2 Flow Rate (L/min): 2 L/min  Intake/output summary:  Intake/Output Summary (Last  24 hours) at 07/04/14 1141 Last data filed at 07/04/14 0849  Gross per 24 hour  Intake    780 ml  Output    825 ml  Net    -45 ml   Baseline Weight: Weight: 44.543 kg (98 lb 3.2 oz) Most recent weight: Weight: 48.081 kg (106 lb)  Physical Exam: Gen: Sitting up in bed, NAD, cachectic HEENT: Temporal muscle wasting, no JVD, moist mucous membranes CV: RRR Pulm: No labored breathing, room air, able to converse with ease Abd: Soft, NT, ND Extremities: MAE, no edema, warm to touch Neuro: Awake, alert, oriented x 3, pleasant   Additional Data Reviewed: No results for input(s): WBC, HGB, PLT, NA, BUN, CREATININE, ALB in the last 72 hours.   Problem List:  Patient Active Problem List   Diagnosis Date Noted  . Palliative care encounter 07/02/2014  . Decreased appetite 07/02/2014  . Peripheral neuropathy 07/02/2014  . Anemia 07/01/2014  . Hypomagnesemia 07/01/2014  . Hemoptysis 06/30/2014  . COLD (chronic obstructive lung disease) 06/10/2014  . Smoking 06/10/2014  . Lung mass 03/21/2014  . Social isolation 03/13/2014  . Chronic back pain 12/04/2013  . Hyperlipidemia   . Severe malnutrition 11/27/2013  . Atherosclerotic PVD with ulceration 10/08/2013  . Pain of right lower leg 08/20/2013  .  Pain 06/24/2013  . Insomnia 06/12/2013  . Duodenal ulcer 05/18/2013  . Perirectal abscess 05/18/2013  . Rheumatoid arthritis 05/18/2013  . Cervical spondylosis   . Anemia B twelve deficiency 04/27/2013  . GI bleed 04/26/2013  . Protein-calorie malnutrition, severe 04/26/2013  . Dysphagia, unspecified(787.20) 04/24/2013  . Diarrhea 04/24/2013  . Cough 04/24/2013  . Cellulitis 04/20/2013  . Sepsis 04/20/2013  . Pneumonia, organism unspecified 01/25/2013  . Polyneuropathy in other diseases classified elsewhere 11/06/2012  . Lumbosacral spondylosis without myelopathy 11/06/2012  . Pain localized to upper abdomen 02/03/2012  . Depression 01/31/2012  . Failure to thrive 01/31/2012  .  Cachexia 01/31/2012  . COPD exacerbation 01/31/2012  . Pancreatic mass 01/31/2012  . Weight loss 01/31/2012  . Syncope 08/14/2011  . Fall 08/14/2011  . Femur fracture 08/14/2011  . Thrombocytopenia 08/14/2011  . COPD (chronic obstructive pulmonary disease) 08/14/2011  . PVD (peripheral vascular disease) 08/14/2011  . Rheumatoid arthritis(714.0) 08/14/2011  . Anxiety 08/14/2011  . Chronic total occlusion of artery of the extremities 05/10/2011     Palliative Care Assessment & Plan    Code Status:  FULL  Goals of Care:  He is struggling with decisions but I think when he has results from biopsy and meets with oncologist he will be more prepared.   Desire for further Chaplaincy support: yes  3. Symptom Management:  Lung biopsy pain: Continue oxycodone 15 mg every 6 hours prn.   Neuropathic pain bilat legs: Continue gabapentin 300 TID - he says this gives him little relief. Also utilize oxycodone prn - could consider Oxycontin 30 mg BID (would likely need titration up from this).   Depression: d/c zoloft 75 mg daily (to avoid serotonin syndrome) and added Cymbalta 30 mg daily but he is experiencing some dizziness and headache - increasing Cymbalta to 60 mg daily.   Decreased appetite/malnutrition: Has lost 50-60 lbs over past year. Continue small/frequent meals. Continue Ensure 2-3 times a day.   Falls: Discussed importance for him to have a first alert button for his safety.   5. Prognosis: < 6 months  5. Discharge Planning: Home with home health and outpatient palliative services.     Care plan was discussed with Dr. Chase Caller.   Thank you for allowing the Palliative Medicine Team to assist in the care of this patient.   Time In: 0900 Time Out: 0930 Total Time 23mn Prolonged Time Billed  no     Greater than 50%  of this time was spent counseling and coordinating care related to the above assessment and plan.   APershing Proud NP  55/44/9201 11:41 AM    Please contact Palliative Medicine Team phone at [phone] questions and concerns.

## 2014-07-07 ENCOUNTER — Telehealth: Payer: Self-pay | Admitting: Internal Medicine

## 2014-07-07 NOTE — Telephone Encounter (Signed)
Tammy  You are seeing him 07/10/14 fo fu lung bx result - shows poorly diff CA. Please refer him to onc afte breaking news but he is prepared for it. He has very poor functional status ECOG 4, chronic pain, gait issues, lives alone, sever copd and just miserable. Cancer is advnced. You could do mini goals (see inpatient palliative note) and if he wants direct hospice at home that is fine. His kids are disabled and can check on him periodically only and honestly I do not think they have resources for chemo/XRT visits ad they have expressed this as a concern. If they want to see onc and decide is fine too.   Note: pall care had a ooutpatient pall care visiting him  Thanks  Dr. Brand Males, M.D., [facility].C.P Pulmonary and Critical Care Medicine Staff Physician [place] [facility] Pager: [phone], If no answer or between  15:00h - 7:00h: call [phone]  07/07/2014 4:54 PM

## 2014-07-10 ENCOUNTER — Encounter: Payer: Self-pay | Admitting: Adult Health

## 2014-07-10 ENCOUNTER — Ambulatory Visit (INDEPENDENT_AMBULATORY_CARE_PROVIDER_SITE_OTHER): Payer: Medicare Other | Admitting: Adult Health

## 2014-07-10 ENCOUNTER — Telehealth: Payer: Self-pay | Admitting: Internal Medicine

## 2014-07-10 VITALS — BP 148/66 | HR 91 | Temp 98.2°F | Ht 68.0 in | Wt 102.0 lb

## 2014-07-10 DIAGNOSIS — C3492 Malignant neoplasm of unspecified part of left bronchus or lung: Secondary | ICD-10-CM

## 2014-07-10 DIAGNOSIS — J449 Chronic obstructive pulmonary disease, unspecified: Secondary | ICD-10-CM

## 2014-07-10 DIAGNOSIS — R918 Other nonspecific abnormal finding of lung field: Secondary | ICD-10-CM

## 2014-07-10 NOTE — Patient Instructions (Signed)
Refer to Hospice.  Continue on current regimen .  Follow up Dr. Chase Caller in 2 months and As needed

## 2014-07-10 NOTE — Telephone Encounter (Signed)
Yes this goal has to be sorted out.  Plesae feel free to d/w me Tammy . I am on side A

## 2014-07-10 NOTE — Telephone Encounter (Signed)
[phone], fran cb

## 2014-07-10 NOTE — Telephone Encounter (Signed)
Spoke with Manus Gunning, she is thinking that he may need a Hospice Referral rather than Palliative Care.  Manus Gunning states that based on the results of the biopsy and treatment options, if more aggressive treatment is needed, the patient might need Hospice Care.  Pt has appt today with TP at 4:15 for [place] was hoping this could be discussed with the family at today's visit. Pt has appt with Palliative/Hospice 07/11/14 to discuss care as well.  Will send to MR and TP to make aware of this request.

## 2014-07-10 NOTE — Telephone Encounter (Signed)
lmomtcb x1 for fran

## 2014-07-11 DIAGNOSIS — C3490 Malignant neoplasm of unspecified part of unspecified bronchus or lung: Secondary | ICD-10-CM | POA: Insufficient documentation

## 2014-07-11 NOTE — Telephone Encounter (Signed)
lmomtcb for [place] with Hospice.

## 2014-07-11 NOTE — Assessment & Plan Note (Signed)
Chest x-ray showed a nodular lesion in the left upper lobe measuring 3.5 x 3.3 cm.03/21/14 CT chest that showed a 3.7 cm mass in the left upper lobe.  PET scan that showed a hypermetabolic left upper lung mass with mildly hypermetabolic left subcarinal lymph node No evidence of metastatic disease in abdomen or pelvis. Pt was set up for bx but had difficulty with setting this up as it was delayed.  He was admitted 5/17 for CT gu ided bx. .  Path showed poorly diff Carcinoma.   Patient declines oncology referral Prefers hospice Hospice referral made. End-of-life discussion with DO NOT RESUSCITATE paperwork completed

## 2014-07-11 NOTE — Assessment & Plan Note (Signed)
Severe COPD, complicated by failure to thrive and cachexia Patient's continue on his current regimen DO NOT RESUSCITATE paperwork completed Hospice referral.

## 2014-07-11 NOTE — Telephone Encounter (Signed)
LMOM TCB x1 for Manus Gunning Was going to inform her of MR's response to be attending and that pt requested to have his daughter Alvy Beal [facility] per pt) to be his primary contact person at [phone].

## 2014-07-11 NOTE — Telephone Encounter (Signed)
[name] called back. Questioning if pt decided to proceed with Hospice. Order placed. [name] requesting order be faxed to her at [phone]. Order faxed. [name] verbalized understanding and questioned if MR is going to be pt's attending.   MR please advise if you are wanting to be the pt's attending with Hospice. Thanks.

## 2014-07-11 NOTE — Progress Notes (Signed)
   Subjective:    Patient ID: [name], male    DOB: [birth year], 80 y.o.   MRN: [id number]  HPI  #COPD  - pirometry 06/25/12"  fev1 Pos BD fev1 1.17L/42% (+5% bd response), FVC 3.4L/88%, R 34, DLCO 36% and is consistent with Gold stage III COPD.    07/10/14 Follow up : COPD /Lung Cancer  Pt has severe severe COPD  Pt found to have lung mass.  Chest x-ray showed a nodular lesion in the left upper lobe measuring 3.5 x 3.3 cm.03/21/14 CT chest that showed a 3.7 cm mass in the left upper lobe.  PET scan that showed a hypermetabolic left upper lung mass with mildly hypermetabolic left subcarinal lymph node No evidence of metastatic disease in abdomen or pelvis. Pt was set up for bx but had difficulty with setting this up as it was delayed.  He was admitted 5/17 for CT gu ided bx. .  Path showed poorly diff Carcinoma.  I reviewed these results with pt and son .  He is very tearful, support provided Pallative care seen pt during hospital .  We discussed referral to Oncology as well as hospice.  He declines oncology referral. He prefers hospice referral. Says he has no transportation  For treatment or appointment. He is very frail and has chronic pain.  We discussed EOL planning, DNR is his wishes.  Out of facility DNR was filled out and given to pt.  Hospice referral was made.  He is on chronic pain meds -says he is in constant pain.       review of Systems  Constitutional:   No  weight loss, night sweats,  Fevers, chills,  +fatigue, or  lassitude.  HEENT:   No headaches,  Difficulty swallowing,  Tooth/dental problems, or  Sore throat,                No sneezing, itching, ear ache, nasal congestion, post nasal drip,   CV:  No chest pain,  Orthopnea, PND, swelling in lower extremities, anasarca, dizziness, palpitations, syncope.   GI  No heartburn, indigestion, abdominal pain, nausea, vomiting, diarrhea, change in bowel habits, loss of appetite, bloody stools.   Resp:  .  No  chest wall deformity  Skin: no rash or lesions.  GU: no dysuria, change in color of urine, no urgency or frequency.  No flank pain, no hematuria   MS:   back pain.      '       Objective:   Physical Exam  GEN: A/Ox3; pleasant , NAD, frail and elderly , cachexic   HEENT:  [place]/AT,  EACs-clear, TMs-wnl, NOSE-clear, THROAT-clear, no lesions, no postnasal drip or exudate noted.   NECK:  Supple w/ fair ROM; no JVD; normal carotid impulses w/o bruits; no thyromegaly or nodules palpated; no lymphadenopathy.  RESP  Decreased BS in bases no accessory muscle use, no dullness to percussion  CARD:  RRR, no m/r/g  , no peripheral edema, pulses intact, no cyanosis or clubbing.  GI:   Soft & nt; nml bowel sounds; no organomegaly or masses detected.  Musco: Warm bil, no deformities or joint swelling noted.   Neuro: alert, no focal deficits noted.    Skin: Warm, no lesions or rashes           Assessment & Plan:

## 2014-07-11 NOTE — Telephone Encounter (Signed)
Saw him office  He opted for hospice, declined oncology referral  Forwarded you my noted

## 2014-07-11 NOTE — Telephone Encounter (Signed)
Manus Gunning returned call Advised MR will be pt's attending physician  Manus Gunning has already spoken with pt's daughter Jeani Hawking and has her contact information  Nothing further needed; will sign off.

## 2014-07-11 NOTE — Telephone Encounter (Signed)
I can be attending. Also please tell triage that starting 10.30am I wont be in reach for epic; I am post call and off through tuesday

## 2014-07-23 ENCOUNTER — Telehealth: Payer: Self-pay | Admitting: Internal Medicine

## 2014-08-08 ENCOUNTER — Other Ambulatory Visit: Payer: Self-pay | Admitting: *Deleted

## 2014-08-08 DIAGNOSIS — I739 Peripheral vascular disease, unspecified: Secondary | ICD-10-CM

## 2014-08-08 DIAGNOSIS — Z48812 Encounter for surgical aftercare following surgery on the circulatory system: Secondary | ICD-10-CM

## 2014-08-13 ENCOUNTER — Telehealth: Payer: Self-pay | Admitting: Vascular Surgery

## 2014-08-13 NOTE — Telephone Encounter (Signed)
Pt's daughter Jeani Hawking called to our office to make [place] aware of the passing of her step father [name]. He passed away on Aug 16, 2014. He is a patient of Dr. Donnetta Hutching and have an upcoming appointment on August 26, 2014 starting at 330 pm. It will need to be cancel and thank you in advance. Lynn's phone is  [phone].

## 2014-08-15 NOTE — Telephone Encounter (Signed)
Will route message to MR to make him aware.

## 2014-08-15 NOTE — Telephone Encounter (Signed)
Sorry to hear. Please get condolence card ready

## 2014-08-15 NOTE — Telephone Encounter (Signed)
Condolence card placed in MR's lookat. Nothing further needed at this time.

## 2014-08-15 DEATH — deceased

## 2014-08-26 ENCOUNTER — Ambulatory Visit: Payer: Medicare Other | Admitting: Family

## 2014-08-26 ENCOUNTER — Encounter (HOSPITAL_COMMUNITY): Payer: Medicare Other

## 2014-09-09 ENCOUNTER — Telehealth: Payer: Self-pay | Admitting: Internal Medicine

## 2014-09-09 ENCOUNTER — Ambulatory Visit: Payer: Medicare Other | Admitting: Internal Medicine

## 2014-09-09 NOTE — Telephone Encounter (Signed)
Daneil Dan, do you have a plan of care on this patient? thanks

## 2014-09-10 NOTE — Telephone Encounter (Signed)
Daneil Dan you have this plan of care? Thanks!

## 2014-09-10 NOTE — Telephone Encounter (Signed)
Manus Gunning is calling from Hospice to see if this plan of care is ready to be picked up.  She may be reached at [phone].

## 2014-09-10 NOTE — Telephone Encounter (Signed)
Daneil Dan do you have this plan of care? Thanks.

## 2014-09-11 NOTE — Telephone Encounter (Signed)
Yes, this is in MR's lookats. Once returned back to me I will fax to Hospice.

## 2014-09-11 NOTE — Telephone Encounter (Signed)
Hospice is calling back about plan of care and need back asap, please have MR to address this today before he leaves. They can be reached [phone] Manus Gunning) said when they are done they will come by office to pick-up.Hillery Hunter

## 2014-09-11 NOTE — Telephone Encounter (Signed)
Spoke with Manus Gunning at [facility], wanting to check on the status of plan of care.  I advised that this has been given to MR and that his nurse will fax this to hospice upon completion. Fax # [phone]  Forwarding to Daneil Dan to make aware.

## 2014-09-12 NOTE — Telephone Encounter (Signed)
Spoke with Manus Gunning at hospice, she is aware that MR still has this form and that we will fax it as soon as we have it completed.  Verified fax # below.

## 2014-09-12 NOTE — Telephone Encounter (Signed)
Will close encounter since MR has the form to sign

## 2014-09-12 NOTE — Telephone Encounter (Signed)
Manus Gunning is in a rush to get this. Please call her at [phone]

## 2014-09-12 NOTE — Telephone Encounter (Signed)
[name] returned [name]'s call.  She can be reached at [phone].

## 2014-09-12 NOTE — Telephone Encounter (Signed)
lmtcb X1 for Manus Gunning.  Per Daneil Dan, this has not yet been given back to her.   Will forward to MR to let him know that Hospice is eagerly awaiting this fax back.

## 2014-10-07 ENCOUNTER — Ambulatory Visit: Payer: Medicare Other | Admitting: Neurology

## 2015-05-05 ENCOUNTER — Encounter: Payer: Self-pay | Admitting: Cardiology
